# Patient Record
Sex: Male | Born: 1940
Health system: Southern US, Community
[De-identification: ages and names within clinical notes are randomized; demographics above are authoritative.]

## PROBLEM LIST (undated history)

## (undated) DIAGNOSIS — M1990 Unspecified osteoarthritis, unspecified site: Secondary | ICD-10-CM

## (undated) DIAGNOSIS — T7840XA Allergy, unspecified, initial encounter: Secondary | ICD-10-CM

## (undated) DIAGNOSIS — C8590 Non-Hodgkin lymphoma, unspecified, unspecified site: Secondary | ICD-10-CM

## (undated) DIAGNOSIS — D682 Hereditary deficiency of other clotting factors: Secondary | ICD-10-CM

## (undated) DIAGNOSIS — IMO0002 Reserved for concepts with insufficient information to code with codable children: Secondary | ICD-10-CM

## (undated) DIAGNOSIS — Z860100 Personal history of colon polyps, unspecified: Secondary | ICD-10-CM

## (undated) DIAGNOSIS — M545 Low back pain, unspecified: Secondary | ICD-10-CM

## (undated) DIAGNOSIS — E785 Hyperlipidemia, unspecified: Secondary | ICD-10-CM

## (undated) DIAGNOSIS — Z9289 Personal history of other medical treatment: Secondary | ICD-10-CM

## (undated) DIAGNOSIS — N400 Enlarged prostate without lower urinary tract symptoms: Secondary | ICD-10-CM

## (undated) DIAGNOSIS — F419 Anxiety disorder, unspecified: Secondary | ICD-10-CM

## (undated) DIAGNOSIS — N200 Calculus of kidney: Secondary | ICD-10-CM

## (undated) DIAGNOSIS — G8929 Other chronic pain: Secondary | ICD-10-CM

## (undated) DIAGNOSIS — C4441 Basal cell carcinoma of skin of scalp and neck: Secondary | ICD-10-CM

## (undated) DIAGNOSIS — I499 Cardiac arrhythmia, unspecified: Secondary | ICD-10-CM

## (undated) DIAGNOSIS — I219 Acute myocardial infarction, unspecified: Secondary | ICD-10-CM

## (undated) DIAGNOSIS — I2510 Atherosclerotic heart disease of native coronary artery without angina pectoris: Secondary | ICD-10-CM

## (undated) DIAGNOSIS — K5730 Diverticulosis of large intestine without perforation or abscess without bleeding: Secondary | ICD-10-CM

## (undated) DIAGNOSIS — F32A Depression, unspecified: Secondary | ICD-10-CM

## (undated) DIAGNOSIS — D693 Immune thrombocytopenic purpura: Secondary | ICD-10-CM

## (undated) DIAGNOSIS — F329 Major depressive disorder, single episode, unspecified: Secondary | ICD-10-CM

## (undated) DIAGNOSIS — Z7901 Long term (current) use of anticoagulants: Secondary | ICD-10-CM

## (undated) HISTORY — PX: KNEE SURGERY: SHX244

## (undated) HISTORY — PX: MOHS SURGERY: SUR867

## (undated) HISTORY — DX: Acute myocardial infarction, unspecified: I21.9

## (undated) HISTORY — DX: Atherosclerotic heart disease of native coronary artery without angina pectoris: I25.10

## (undated) HISTORY — PX: VASECTOMY: SHX75

## (undated) HISTORY — PX: TONSILLECTOMY: SUR1361

## (undated) HISTORY — PX: COLONOSCOPY: SHX174

## (undated) HISTORY — PX: CATARACT EXTRACTION W/ INTRAOCULAR LENS  IMPLANT, BILATERAL: SHX1307

## (undated) HISTORY — DX: Allergy, unspecified, initial encounter: T78.40XA

## (undated) HISTORY — DX: Unspecified osteoarthritis, unspecified site: M19.90

## (undated) HISTORY — DX: Anxiety disorder, unspecified: F41.9

## (undated) HISTORY — DX: Benign prostatic hyperplasia without lower urinary tract symptoms: N40.0

## (undated) HISTORY — DX: Personal history of colon polyps, unspecified: Z86.0100

## (undated) HISTORY — DX: Major depressive disorder, single episode, unspecified: F32.9

## (undated) HISTORY — DX: Depression, unspecified: F32.A

## (undated) HISTORY — DX: Diverticulosis of large intestine without perforation or abscess without bleeding: K57.30

## (undated) HISTORY — DX: Hyperlipidemia, unspecified: E78.5

## (undated) HISTORY — PX: LUMBAR EPIDURAL INJECTION: SHX1980

## (undated) HISTORY — PX: CYST EXCISION: SHX5701

## (undated) HISTORY — DX: Long term (current) use of anticoagulants: Z79.01

## (undated) HISTORY — DX: Personal history of colonic polyps: Z86.010

---
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
## 2004-07-31 ENCOUNTER — Ambulatory Visit: Payer: Self-pay | Admitting: Internal Medicine

## 2004-09-18 ENCOUNTER — Ambulatory Visit: Payer: Self-pay | Admitting: Internal Medicine

## 2005-01-09 ENCOUNTER — Ambulatory Visit: Payer: Self-pay | Admitting: Internal Medicine

## 2005-03-20 ENCOUNTER — Ambulatory Visit: Payer: Self-pay | Admitting: Internal Medicine

## 2005-08-06 ENCOUNTER — Ambulatory Visit: Payer: Self-pay | Admitting: Internal Medicine

## 2006-08-08 ENCOUNTER — Ambulatory Visit: Payer: Self-pay | Admitting: Internal Medicine

## 2006-08-08 LAB — CONVERTED CEMR LAB
Albumin: 4 g/dL (ref 3.5–5.2)
Alkaline Phosphatase: 69 units/L (ref 39–117)
Basophils Absolute: 0 10*3/uL (ref 0.0–0.1)
Basophils Relative: 0.4 % (ref 0.0–1.0)
Bilirubin, Direct: 0.1 mg/dL (ref 0.0–0.3)
Chloride: 106 meq/L (ref 96–112)
Cholesterol: 208 mg/dL (ref 0–200)
Creatinine, Ser: 0.8 mg/dL (ref 0.4–1.5)
Direct LDL: 137.3 mg/dL
Eosinophils Absolute: 0.2 10*3/uL (ref 0.0–0.6)
Eosinophils Relative: 4.3 % (ref 0.0–5.0)
GFR calc Af Amer: 124 mL/min
GFR calc non Af Amer: 102 mL/min
Hemoglobin: 16.5 g/dL (ref 13.0–17.0)
MCHC: 34 g/dL (ref 30.0–36.0)
Monocytes Absolute: 0.3 10*3/uL (ref 0.2–0.7)
Monocytes Relative: 6.4 % (ref 3.0–11.0)
Neutro Abs: 3.5 10*3/uL (ref 1.4–7.7)
Neutrophils Relative %: 65 % (ref 43.0–77.0)
RDW: 11.9 % (ref 11.5–14.6)
Total Bilirubin: 1.4 mg/dL — ABNORMAL HIGH (ref 0.3–1.2)

## 2006-08-21 ENCOUNTER — Ambulatory Visit: Payer: Self-pay | Admitting: Internal Medicine

## 2006-09-06 ENCOUNTER — Ambulatory Visit: Payer: Self-pay | Admitting: Gastroenterology

## 2006-09-17 ENCOUNTER — Encounter: Payer: Self-pay | Admitting: Gastroenterology

## 2006-09-17 ENCOUNTER — Encounter: Payer: Self-pay | Admitting: Internal Medicine

## 2006-09-17 ENCOUNTER — Ambulatory Visit: Payer: Self-pay | Admitting: Gastroenterology

## 2006-09-17 LAB — HM COLONOSCOPY

## 2006-10-22 ENCOUNTER — Encounter: Payer: Self-pay | Admitting: Internal Medicine

## 2006-10-22 ENCOUNTER — Ambulatory Visit (HOSPITAL_BASED_OUTPATIENT_CLINIC_OR_DEPARTMENT_OTHER): Admission: RE | Admit: 2006-10-22 | Discharge: 2006-10-22 | Payer: Self-pay | Admitting: Internal Medicine

## 2006-10-26 ENCOUNTER — Ambulatory Visit: Payer: Self-pay | Admitting: Pulmonary Disease

## 2006-11-18 ENCOUNTER — Encounter: Payer: Self-pay | Admitting: Internal Medicine

## 2006-11-18 DIAGNOSIS — N23 Unspecified renal colic: Secondary | ICD-10-CM | POA: Insufficient documentation

## 2006-11-18 DIAGNOSIS — E785 Hyperlipidemia, unspecified: Secondary | ICD-10-CM | POA: Insufficient documentation

## 2006-11-18 DIAGNOSIS — J309 Allergic rhinitis, unspecified: Secondary | ICD-10-CM

## 2006-11-18 DIAGNOSIS — K5730 Diverticulosis of large intestine without perforation or abscess without bleeding: Secondary | ICD-10-CM | POA: Insufficient documentation

## 2006-11-18 DIAGNOSIS — N400 Enlarged prostate without lower urinary tract symptoms: Secondary | ICD-10-CM | POA: Insufficient documentation

## 2006-11-18 DIAGNOSIS — M1990 Unspecified osteoarthritis, unspecified site: Secondary | ICD-10-CM

## 2007-01-08 ENCOUNTER — Telehealth: Payer: Self-pay | Admitting: Family Medicine

## 2007-03-17 ENCOUNTER — Ambulatory Visit: Payer: Self-pay | Admitting: Physical Medicine & Rehabilitation

## 2007-03-17 ENCOUNTER — Encounter
Admission: RE | Admit: 2007-03-17 | Discharge: 2007-05-06 | Payer: Self-pay | Admitting: Physical Medicine & Rehabilitation

## 2007-04-16 ENCOUNTER — Ambulatory Visit (HOSPITAL_COMMUNITY)
Admission: RE | Admit: 2007-04-16 | Discharge: 2007-04-16 | Payer: Self-pay | Admitting: Physical Medicine & Rehabilitation

## 2007-04-25 ENCOUNTER — Ambulatory Visit (HOSPITAL_COMMUNITY)
Admission: RE | Admit: 2007-04-25 | Discharge: 2007-04-25 | Payer: Self-pay | Admitting: Physical Medicine & Rehabilitation

## 2007-04-28 ENCOUNTER — Ambulatory Visit: Payer: Self-pay | Admitting: Physical Medicine & Rehabilitation

## 2007-05-15 DIAGNOSIS — I219 Acute myocardial infarction, unspecified: Secondary | ICD-10-CM

## 2007-05-15 HISTORY — DX: Acute myocardial infarction, unspecified: I21.9

## 2007-05-27 ENCOUNTER — Encounter
Admission: RE | Admit: 2007-05-27 | Discharge: 2007-05-27 | Payer: Self-pay | Admitting: Physical Medicine & Rehabilitation

## 2007-05-29 ENCOUNTER — Ambulatory Visit: Payer: Self-pay | Admitting: Cardiology

## 2007-05-29 ENCOUNTER — Inpatient Hospital Stay (HOSPITAL_COMMUNITY): Admission: EM | Admit: 2007-05-29 | Discharge: 2007-06-02 | Payer: Self-pay | Admitting: Cardiology

## 2007-05-29 HISTORY — PX: CORONARY ANGIOPLASTY WITH STENT PLACEMENT: SHX49

## 2007-05-30 ENCOUNTER — Encounter: Payer: Self-pay | Admitting: Cardiology

## 2007-06-12 ENCOUNTER — Ambulatory Visit: Payer: Self-pay | Admitting: Cardiology

## 2007-06-19 ENCOUNTER — Encounter (HOSPITAL_COMMUNITY): Admission: RE | Admit: 2007-06-19 | Discharge: 2007-09-17 | Payer: Self-pay | Admitting: Cardiology

## 2007-06-24 ENCOUNTER — Ambulatory Visit: Payer: Self-pay | Admitting: Cardiology

## 2007-06-24 ENCOUNTER — Encounter: Payer: Self-pay | Admitting: Cardiology

## 2007-06-24 ENCOUNTER — Ambulatory Visit: Payer: Self-pay

## 2007-06-24 LAB — CONVERTED CEMR LAB
ALT: 27 units/L (ref 0–53)
AST: 25 units/L (ref 0–37)
Albumin: 3.9 g/dL (ref 3.5–5.2)
Alkaline Phosphatase: 60 units/L (ref 39–117)
Basophils Absolute: 0 10*3/uL (ref 0.0–0.1)
CO2: 30 meq/L (ref 19–32)
Chloride: 105 meq/L (ref 96–112)
Creatinine, Ser: 1 mg/dL (ref 0.4–1.5)
Eosinophils Relative: 2.2 % (ref 0.0–5.0)
GFR calc non Af Amer: 79 mL/min
HDL: 38.9 mg/dL — ABNORMAL LOW (ref >39.0)
Hemoglobin: 15.9 g/dL (ref 13.0–17.0)
LDL Cholesterol: 45 mg/dL (ref 0–99)
MCV: 96.8 fL (ref 78.0–100.0)
Platelets: 117 10*3/uL — ABNORMAL LOW (ref 150–400)
RDW: 11.7 % (ref 11.5–14.6)
Sodium: 141 meq/L (ref 135–145)
Total CHOL/HDL Ratio: 2.4
Triglycerides: 56 mg/dL (ref 0–149)
VLDL: 11 mg/dL (ref 0–40)

## 2007-06-28 ENCOUNTER — Ambulatory Visit: Payer: Self-pay | Admitting: *Deleted

## 2007-06-28 ENCOUNTER — Observation Stay (HOSPITAL_COMMUNITY): Admission: EM | Admit: 2007-06-28 | Discharge: 2007-06-29 | Payer: Self-pay | Admitting: Emergency Medicine

## 2007-07-09 ENCOUNTER — Ambulatory Visit: Payer: Self-pay | Admitting: Internal Medicine

## 2007-07-10 ENCOUNTER — Ambulatory Visit: Payer: Self-pay | Admitting: Internal Medicine

## 2007-07-10 DIAGNOSIS — I2510 Atherosclerotic heart disease of native coronary artery without angina pectoris: Secondary | ICD-10-CM

## 2007-07-10 DIAGNOSIS — J069 Acute upper respiratory infection, unspecified: Secondary | ICD-10-CM | POA: Insufficient documentation

## 2007-07-10 DIAGNOSIS — I10 Essential (primary) hypertension: Secondary | ICD-10-CM | POA: Insufficient documentation

## 2007-07-10 LAB — CONVERTED CEMR LAB: PSA: 0.97 ng/mL (ref 0.10–4.00)

## 2007-07-22 ENCOUNTER — Ambulatory Visit: Payer: Self-pay | Admitting: Cardiology

## 2007-07-28 ENCOUNTER — Ambulatory Visit: Payer: Self-pay | Admitting: Internal Medicine

## 2007-07-28 LAB — CONVERTED CEMR LAB
Basophils Absolute: 0 10*3/uL (ref 0.0–0.1)
Basophils Relative: 0.8 % (ref 0.0–1.0)
Eosinophils Absolute: 0.1 10*3/uL (ref 0.0–0.6)
Eosinophils Relative: 2.6 % (ref 0.0–5.0)
HCT: 47.6 % (ref 39.0–52.0)
Lymphocytes Relative: 30.5 % (ref 12.0–46.0)
Monocytes Absolute: 0.4 10*3/uL (ref 0.2–0.7)
Neutrophils Relative %: 58.7 % (ref 43.0–77.0)
RDW: 12.4 % (ref 11.5–14.6)
WBC: 4.9 10*3/uL (ref 4.5–10.5)

## 2007-07-30 ENCOUNTER — Telehealth: Payer: Self-pay | Admitting: Internal Medicine

## 2007-08-20 ENCOUNTER — Ambulatory Visit: Payer: Self-pay | Admitting: Cardiology

## 2007-08-20 LAB — CONVERTED CEMR LAB
Alkaline Phosphatase: 49 units/L (ref 39–117)
Cholesterol: 93 mg/dL (ref 0–200)
Total CHOL/HDL Ratio: 2.3
Triglycerides: 72 mg/dL (ref 0–149)
VLDL: 14 mg/dL (ref 0–40)

## 2007-09-15 ENCOUNTER — Encounter: Payer: Self-pay | Admitting: Family Medicine

## 2007-09-18 ENCOUNTER — Encounter (HOSPITAL_COMMUNITY): Admission: RE | Admit: 2007-09-18 | Discharge: 2007-09-26 | Payer: Self-pay | Admitting: Cardiology

## 2007-12-02 ENCOUNTER — Ambulatory Visit: Payer: Self-pay | Admitting: Cardiology

## 2007-12-03 ENCOUNTER — Ambulatory Visit: Payer: Self-pay | Admitting: Cardiology

## 2007-12-03 LAB — CONVERTED CEMR LAB
ALT: 31 units/L (ref 0–53)
AST: 25 units/L (ref 0–37)
Albumin: 4 g/dL (ref 3.5–5.2)
Basophils Relative: 0.4 % (ref 0.0–3.0)
CO2: 29 meq/L (ref 19–32)
Calcium: 9.1 mg/dL (ref 8.4–10.5)
Creatinine, Ser: 1 mg/dL (ref 0.4–1.5)
Eosinophils Absolute: 0.1 10*3/uL (ref 0.0–0.7)
GFR calc non Af Amer: 79 mL/min
HCT: 45.1 % (ref 39.0–52.0)
Hemoglobin: 15.2 g/dL (ref 13.0–17.0)
LDL Cholesterol: 40 mg/dL (ref 0–99)
Lymphocytes Relative: 38.4 % (ref 12.0–46.0)
MCHC: 33.7 g/dL (ref 30.0–36.0)
MCV: 98.2 fL (ref 78.0–100.0)
Monocytes Relative: 6.9 % (ref 3.0–12.0)
Neutro Abs: 2.1 10*3/uL (ref 1.4–7.7)
Platelets: 120 10*3/uL — ABNORMAL LOW (ref 150–400)
Potassium: 4.8 meq/L (ref 3.5–5.1)
RDW: 12.4 % (ref 11.5–14.6)
Sodium: 143 meq/L (ref 135–145)
Total CHOL/HDL Ratio: 2.4
Total Protein: 6.1 g/dL (ref 6.0–8.3)

## 2008-01-23 ENCOUNTER — Telehealth: Payer: Self-pay | Admitting: Internal Medicine

## 2008-02-26 ENCOUNTER — Ambulatory Visit: Payer: Self-pay | Admitting: Internal Medicine

## 2008-02-26 DIAGNOSIS — D696 Thrombocytopenia, unspecified: Secondary | ICD-10-CM

## 2008-02-27 ENCOUNTER — Telehealth: Payer: Self-pay | Admitting: Internal Medicine

## 2008-02-27 LAB — CONVERTED CEMR LAB
MCHC: 34.3 g/dL (ref 30.0–36.0)
MCV: 98.4 fL (ref 78.0–100.0)
Monocytes Absolute: 0.5 10*3/uL (ref 0.1–1.0)
Neutrophils Relative %: 66.3 % (ref 43.0–77.0)
RBC: 4.51 M/uL (ref 4.22–5.81)
RDW: 12.6 % (ref 11.5–14.6)

## 2008-03-09 ENCOUNTER — Ambulatory Visit: Payer: Self-pay | Admitting: Oncology

## 2008-03-17 ENCOUNTER — Encounter: Payer: Self-pay | Admitting: Internal Medicine

## 2008-03-17 LAB — CBC & DIFF AND RETIC
BASO%: 0.3 % (ref 0.0–2.0)
EOS%: 1.9 % (ref 0.0–7.0)
IRF: 0.31 (ref 0.070–0.380)
MCH: 33.1 pg (ref 28.0–33.4)
MCHC: 33.3 g/dL (ref 32.0–35.9)
MONO#: 0.3 10*3/uL (ref 0.1–0.9)
NEUT%: 68.7 % (ref 40.0–75.0)
RBC: 4.45 10*6/uL (ref 4.20–5.71)
RDW: 12.9 % (ref 11.2–14.6)
RETIC #: 27.6 10*3/uL — ABNORMAL LOW (ref 31.8–103.9)
Retic %: 0.6 % — ABNORMAL LOW (ref 0.7–2.3)
WBC: 6.1 10*3/uL (ref 4.0–10.0)
lymph#: 1.4 10*3/uL (ref 0.9–3.3)

## 2008-03-17 LAB — MORPHOLOGY
PLT EST: DECREASED
RBC Comments: NORMAL

## 2008-03-17 LAB — CHCC SMEAR

## 2008-03-17 LAB — ERYTHROCYTE SEDIMENTATION RATE: Sed Rate: 0 mm/hr (ref 0–20)

## 2008-03-18 LAB — COMPREHENSIVE METABOLIC PANEL
ALT: 30 U/L (ref 0–53)
AST: 22 U/L (ref 0–37)
CO2: 28 mEq/L (ref 19–32)
Calcium: 9 mg/dL (ref 8.4–10.5)
Chloride: 105 mEq/L (ref 96–112)
Creatinine, Ser: 0.87 mg/dL (ref 0.40–1.50)
Potassium: 4.5 mEq/L (ref 3.5–5.3)
Sodium: 141 mEq/L (ref 135–145)
Total Protein: 6.5 g/dL (ref 6.0–8.3)

## 2008-05-16 DIAGNOSIS — I255 Ischemic cardiomyopathy: Secondary | ICD-10-CM | POA: Insufficient documentation

## 2008-05-18 ENCOUNTER — Ambulatory Visit: Payer: Self-pay | Admitting: Cardiology

## 2008-05-27 ENCOUNTER — Ambulatory Visit: Payer: Self-pay | Admitting: Cardiology

## 2008-05-27 LAB — CONVERTED CEMR LAB
Albumin: 3.9 g/dL (ref 3.5–5.2)
BUN: 14 mg/dL (ref 6–23)
Basophils Relative: 0.6 % (ref 0.0–3.0)
CO2: 32 meq/L (ref 19–32)
Calcium: 9 mg/dL (ref 8.4–10.5)
Creatinine, Ser: 0.8 mg/dL (ref 0.4–1.5)
Eosinophils Absolute: 0.2 10*3/uL (ref 0.0–0.7)
Eosinophils Relative: 3.3 % (ref 0.0–5.0)
GFR calc non Af Amer: 102 mL/min
HCT: 43 % (ref 39.0–52.0)
HDL: 51.7 mg/dL (ref >39.0)
Hemoglobin: 14.8 g/dL (ref 13.0–17.0)
Lymphocytes Relative: 31.8 % (ref 12.0–46.0)
MCHC: 34.4 g/dL (ref 30.0–36.0)
Neutro Abs: 2.7 10*3/uL (ref 1.4–7.7)
Platelets: 120 10*3/uL — ABNORMAL LOW (ref 150–400)
Potassium: 4.2 meq/L (ref 3.5–5.1)
RDW: 12.1 % (ref 11.5–14.6)
Sodium: 140 meq/L (ref 135–145)
Total Protein: 6 g/dL (ref 6.0–8.3)
Triglycerides: 77 mg/dL (ref 0–149)
VLDL: 15 mg/dL (ref 0–40)

## 2008-06-08 ENCOUNTER — Ambulatory Visit: Payer: Self-pay | Admitting: Oncology

## 2008-06-08 ENCOUNTER — Encounter: Payer: Self-pay | Admitting: Internal Medicine

## 2008-06-08 LAB — CBC WITH DIFFERENTIAL/PLATELET
Eosinophils Absolute: 0.1 10*3/uL (ref 0.0–0.5)
HCT: 43.6 % (ref 38.7–49.9)
LYMPH%: 30.4 % (ref 14.0–48.0)
MCV: 97.7 fL (ref 81.6–98.0)
MONO#: 0.4 10*3/uL (ref 0.1–0.9)
MONO%: 8.4 % (ref 0.0–13.0)
NEUT#: 2.5 10*3/uL (ref 1.5–6.5)
NEUT%: 57.2 % (ref 40.0–75.0)
Platelets: 114 10*3/uL — ABNORMAL LOW (ref 145–400)
RBC: 4.46 10*6/uL (ref 4.20–5.71)
WBC: 4.3 10*3/uL (ref 4.0–10.0)

## 2008-06-08 LAB — MORPHOLOGY

## 2008-06-11 ENCOUNTER — Encounter: Admission: RE | Admit: 2008-06-11 | Discharge: 2008-06-11 | Payer: Self-pay | Admitting: Otolaryngology

## 2008-07-12 ENCOUNTER — Encounter: Payer: Self-pay | Admitting: Internal Medicine

## 2008-07-27 ENCOUNTER — Ambulatory Visit: Payer: Self-pay | Admitting: Internal Medicine

## 2008-08-23 ENCOUNTER — Encounter: Payer: Self-pay | Admitting: Cardiology

## 2008-08-23 ENCOUNTER — Ambulatory Visit: Payer: Self-pay | Admitting: Cardiology

## 2008-09-08 ENCOUNTER — Ambulatory Visit: Payer: Self-pay | Admitting: Cardiology

## 2008-09-08 DIAGNOSIS — I2510 Atherosclerotic heart disease of native coronary artery without angina pectoris: Secondary | ICD-10-CM | POA: Insufficient documentation

## 2008-09-08 LAB — CONVERTED CEMR LAB
BUN: 12 mg/dL (ref 6–23)
CO2: 32 meq/L (ref 19–32)
Calcium: 8.8 mg/dL (ref 8.4–10.5)
Chloride: 108 meq/L (ref 96–112)
Creatinine, Ser: 0.9 mg/dL (ref 0.4–1.5)

## 2008-09-13 ENCOUNTER — Ambulatory Visit: Payer: Self-pay | Admitting: Oncology

## 2008-09-14 ENCOUNTER — Ambulatory Visit: Payer: Self-pay | Admitting: Cardiology

## 2008-09-14 DIAGNOSIS — G4733 Obstructive sleep apnea (adult) (pediatric): Secondary | ICD-10-CM

## 2008-09-15 ENCOUNTER — Encounter: Payer: Self-pay | Admitting: Internal Medicine

## 2008-09-15 LAB — CBC WITH DIFFERENTIAL/PLATELET
BASO%: 0.4 % (ref 0.0–2.0)
Basophils Absolute: 0 10*3/uL (ref 0.0–0.1)
EOS%: 3.2 % (ref 0.0–7.0)
HGB: 13.5 g/dL (ref 13.0–17.1)
MCH: 33.3 pg (ref 27.2–33.4)
MCHC: 33.8 g/dL (ref 32.0–36.0)
MCV: 98.4 fL — ABNORMAL HIGH (ref 79.3–98.0)
MONO%: 7.2 % (ref 0.0–14.0)
RDW: 13 % (ref 11.0–14.6)
lymph#: 1.3 10*3/uL (ref 0.9–3.3)

## 2008-09-17 ENCOUNTER — Ambulatory Visit: Payer: Self-pay | Admitting: Pulmonary Disease

## 2008-09-17 DIAGNOSIS — G2589 Other specified extrapyramidal and movement disorders: Secondary | ICD-10-CM

## 2008-09-20 LAB — CONVERTED CEMR LAB
Iron: 109 ug/dL (ref 42–165)
Saturation Ratios: 37.4 % (ref 20.0–50.0)
Transferrin: 208.1 mg/dL — ABNORMAL LOW (ref 212.0–360.0)

## 2008-10-20 ENCOUNTER — Encounter: Payer: Self-pay | Admitting: Cardiology

## 2008-10-20 ENCOUNTER — Encounter: Payer: Self-pay | Admitting: Internal Medicine

## 2009-01-12 ENCOUNTER — Telehealth (INDEPENDENT_AMBULATORY_CARE_PROVIDER_SITE_OTHER): Payer: Self-pay | Admitting: *Deleted

## 2009-01-13 ENCOUNTER — Telehealth (INDEPENDENT_AMBULATORY_CARE_PROVIDER_SITE_OTHER): Payer: Self-pay | Admitting: *Deleted

## 2009-03-04 ENCOUNTER — Ambulatory Visit: Payer: Self-pay | Admitting: Oncology

## 2009-03-08 ENCOUNTER — Encounter: Payer: Self-pay | Admitting: Cardiology

## 2009-03-08 LAB — CBC WITH DIFFERENTIAL/PLATELET
BASO%: 0.3 % (ref 0.0–2.0)
Basophils Absolute: 0 10*3/uL (ref 0.0–0.1)
EOS%: 2.6 % (ref 0.0–7.0)
HCT: 43.1 % (ref 38.4–49.9)
HGB: 14.3 g/dL (ref 13.0–17.1)
LYMPH%: 27.2 % (ref 14.0–49.0)
MCH: 33.8 pg — ABNORMAL HIGH (ref 27.2–33.4)
MCHC: 33.3 g/dL (ref 32.0–36.0)
MCV: 101.6 fL — ABNORMAL HIGH (ref 79.3–98.0)
MONO%: 7.1 % (ref 0.0–14.0)
NEUT%: 62.8 % (ref 39.0–75.0)
Platelets: 104 10*3/uL — ABNORMAL LOW (ref 140–400)

## 2009-03-16 ENCOUNTER — Ambulatory Visit: Payer: Self-pay | Admitting: Cardiovascular Disease

## 2009-03-16 ENCOUNTER — Ambulatory Visit (HOSPITAL_COMMUNITY): Admission: RE | Admit: 2009-03-16 | Discharge: 2009-03-16 | Payer: Self-pay | Admitting: Cardiology

## 2009-03-16 ENCOUNTER — Encounter: Payer: Self-pay | Admitting: Cardiology

## 2009-03-16 ENCOUNTER — Ambulatory Visit: Payer: Self-pay

## 2009-03-16 ENCOUNTER — Ambulatory Visit: Payer: Self-pay | Admitting: Cardiology

## 2009-03-23 ENCOUNTER — Encounter (INDEPENDENT_AMBULATORY_CARE_PROVIDER_SITE_OTHER): Payer: Self-pay | Admitting: *Deleted

## 2009-03-24 ENCOUNTER — Ambulatory Visit: Payer: Self-pay | Admitting: Cardiology

## 2009-03-25 ENCOUNTER — Ambulatory Visit: Payer: Self-pay | Admitting: Cardiology

## 2009-03-25 LAB — CONVERTED CEMR LAB
AST: 25 units/L (ref 0–37)
BUN: 15 mg/dL (ref 6–23)
Basophils Absolute: 0 10*3/uL (ref 0.0–0.1)
Bilirubin, Direct: 0.2 mg/dL (ref 0.0–0.3)
CO2: 31 meq/L (ref 19–32)
Calcium: 8.9 mg/dL (ref 8.4–10.5)
Chloride: 106 meq/L (ref 96–112)
Cholesterol: 99 mg/dL (ref 0–200)
Eosinophils Absolute: 0.2 10*3/uL (ref 0.0–0.7)
Eosinophils Relative: 4.1 % (ref 0.0–5.0)
GFR calc non Af Amer: 88.55 mL/min (ref >60)
Glucose, Bld: 102 mg/dL — ABNORMAL HIGH (ref 70–99)
HCT: 44.7 % (ref 39.0–52.0)
Lymphocytes Relative: 36 % (ref 12.0–46.0)
Lymphs Abs: 1.5 10*3/uL (ref 0.7–4.0)
MCHC: 34 g/dL (ref 30.0–36.0)
MCV: 103.4 fL — ABNORMAL HIGH (ref 78.0–100.0)
Monocytes Absolute: 0.3 10*3/uL (ref 0.1–1.0)
Neutro Abs: 2.3 10*3/uL (ref 1.4–7.7)
Platelets: 103 10*3/uL — ABNORMAL LOW (ref 150.0–400.0)
Potassium: 4.4 meq/L (ref 3.5–5.1)
RDW: 12 % (ref 11.5–14.6)
Sodium: 142 meq/L (ref 135–145)
Total Protein: 5.8 g/dL — ABNORMAL LOW (ref 6.0–8.3)
Triglycerides: 61 mg/dL (ref 0.0–149.0)
WBC: 4.3 10*3/uL — ABNORMAL LOW (ref 4.5–10.5)

## 2009-04-11 ENCOUNTER — Telehealth: Payer: Self-pay | Admitting: Cardiology

## 2009-04-13 ENCOUNTER — Encounter (INDEPENDENT_AMBULATORY_CARE_PROVIDER_SITE_OTHER): Payer: Self-pay | Admitting: *Deleted

## 2009-05-10 ENCOUNTER — Emergency Department (HOSPITAL_BASED_OUTPATIENT_CLINIC_OR_DEPARTMENT_OTHER): Admission: EM | Admit: 2009-05-10 | Discharge: 2009-05-11 | Payer: Self-pay | Admitting: Emergency Medicine

## 2009-05-20 ENCOUNTER — Telehealth: Payer: Self-pay | Admitting: Cardiology

## 2009-05-25 ENCOUNTER — Telehealth: Payer: Self-pay | Admitting: Internal Medicine

## 2009-06-03 ENCOUNTER — Ambulatory Visit: Payer: Self-pay | Admitting: Oncology

## 2009-06-07 ENCOUNTER — Encounter: Payer: Self-pay | Admitting: Internal Medicine

## 2009-06-07 LAB — CBC WITH DIFFERENTIAL/PLATELET
Basophils Absolute: 0 10*3/uL (ref 0.0–0.1)
HGB: 15.2 g/dL (ref 13.0–17.1)
LYMPH%: 24.3 % (ref 14.0–49.0)
MCH: 33.7 pg — ABNORMAL HIGH (ref 27.2–33.4)
MONO%: 7.3 % (ref 0.0–14.0)
NEUT#: 3.4 10*3/uL (ref 1.5–6.5)
NEUT%: 65.6 % (ref 39.0–75.0)
Platelets: 108 10*3/uL — ABNORMAL LOW (ref 140–400)
RDW: 12.7 % (ref 11.0–14.6)
WBC: 5.2 10*3/uL (ref 4.0–10.3)
lymph#: 1.3 10*3/uL (ref 0.9–3.3)

## 2009-07-18 ENCOUNTER — Telehealth: Payer: Self-pay | Admitting: Gastroenterology

## 2009-07-28 ENCOUNTER — Ambulatory Visit: Payer: Self-pay | Admitting: Internal Medicine

## 2009-07-28 LAB — CONVERTED CEMR LAB: PSA: 1.15 ng/mL (ref 0.10–4.00)

## 2009-08-26 ENCOUNTER — Ambulatory Visit: Payer: Self-pay | Admitting: Oncology

## 2009-08-30 ENCOUNTER — Encounter: Payer: Self-pay | Admitting: Internal Medicine

## 2009-08-30 LAB — CBC WITH DIFFERENTIAL/PLATELET
EOS%: 2.4 % (ref 0.0–7.0)
HCT: 42.6 % (ref 38.4–49.9)
MCH: 33.8 pg — ABNORMAL HIGH (ref 27.2–33.4)
MCHC: 33.6 g/dL (ref 32.0–36.0)
MCV: 100.4 fL — ABNORMAL HIGH (ref 79.3–98.0)
MONO#: 0.4 10*3/uL (ref 0.1–0.9)
WBC: 5.5 10*3/uL (ref 4.0–10.3)
lymph#: 1.3 10*3/uL (ref 0.9–3.3)

## 2009-08-30 LAB — MORPHOLOGY: RBC Comments: NORMAL

## 2009-09-06 ENCOUNTER — Telehealth: Payer: Self-pay

## 2009-09-08 ENCOUNTER — Ambulatory Visit: Payer: Self-pay | Admitting: Cardiology

## 2009-09-08 DIAGNOSIS — I119 Hypertensive heart disease without heart failure: Secondary | ICD-10-CM | POA: Insufficient documentation

## 2009-09-08 LAB — CONVERTED CEMR LAB
AST: 22 units/L (ref 0–37)
Albumin: 4.1 g/dL (ref 3.5–5.2)
Alkaline Phosphatase: 51 units/L (ref 39–117)
BUN: 15 mg/dL (ref 6–23)
Bilirubin, Direct: 0.3 mg/dL (ref 0.0–0.3)
Creatinine, Ser: 0.9 mg/dL (ref 0.4–1.5)
LDL Cholesterol: 37 mg/dL (ref 0–99)
Potassium: 3.9 meq/L (ref 3.5–5.1)
Total Bilirubin: 1.2 mg/dL (ref 0.3–1.2)
Total CHOL/HDL Ratio: 2
VLDL: 11.2 mg/dL (ref 0.0–40.0)

## 2009-09-12 ENCOUNTER — Ambulatory Visit: Payer: Self-pay | Admitting: Cardiology

## 2009-11-24 ENCOUNTER — Encounter: Payer: Self-pay | Admitting: Cardiology

## 2009-12-12 ENCOUNTER — Telehealth: Payer: Self-pay | Admitting: Cardiology

## 2009-12-12 ENCOUNTER — Encounter: Payer: Self-pay | Admitting: Internal Medicine

## 2010-01-31 ENCOUNTER — Telehealth: Payer: Self-pay | Admitting: Internal Medicine

## 2010-03-07 ENCOUNTER — Encounter: Payer: Self-pay | Admitting: Cardiology

## 2010-03-07 ENCOUNTER — Ambulatory Visit: Payer: Self-pay | Admitting: Cardiovascular Disease

## 2010-03-07 ENCOUNTER — Ambulatory Visit (HOSPITAL_COMMUNITY): Admission: RE | Admit: 2010-03-07 | Discharge: 2010-03-07 | Payer: Self-pay | Admitting: Cardiology

## 2010-03-07 ENCOUNTER — Ambulatory Visit: Payer: Self-pay

## 2010-03-07 ENCOUNTER — Ambulatory Visit: Payer: Self-pay | Admitting: Cardiology

## 2010-03-15 ENCOUNTER — Encounter: Payer: Self-pay | Admitting: Cardiology

## 2010-03-15 ENCOUNTER — Ambulatory Visit: Payer: Self-pay | Admitting: Oncology

## 2010-03-16 ENCOUNTER — Ambulatory Visit: Payer: Self-pay | Admitting: Cardiology

## 2010-03-16 LAB — CONVERTED CEMR LAB
Basophils Relative: 0.7 % (ref 0.0–3.0)
Bilirubin, Direct: 0.3 mg/dL (ref 0.0–0.3)
CO2: 28 meq/L (ref 19–32)
Calcium: 9 mg/dL (ref 8.4–10.5)
Eosinophils Relative: 3.6 % (ref 0.0–5.0)
HCT: 45.5 % (ref 39.0–52.0)
HDL: 52.8 mg/dL (ref >39.00)
Hemoglobin: 15 g/dL (ref 13.0–17.0)
MCHC: 33 g/dL (ref 30.0–36.0)
MCV: 101.2 fL — ABNORMAL HIGH (ref 78.0–100.0)
Monocytes Relative: 7.4 % (ref 3.0–12.0)
Neutrophils Relative %: 57.2 % (ref 43.0–77.0)
Platelets: 108 10*3/uL — ABNORMAL LOW (ref 150.0–400.0)
Potassium: 4 meq/L (ref 3.5–5.1)
RDW: 13.3 % (ref 11.5–14.6)
Sodium: 141 meq/L (ref 135–145)
Total CHOL/HDL Ratio: 2
Triglycerides: 45 mg/dL (ref 0.0–149.0)
VLDL: 9 mg/dL (ref 0.0–40.0)

## 2010-03-17 ENCOUNTER — Encounter: Payer: Self-pay | Admitting: Internal Medicine

## 2010-03-17 LAB — CBC WITH DIFFERENTIAL/PLATELET
EOS%: 2.4 % (ref 0.0–7.0)
Eosinophils Absolute: 0.1 10*3/uL (ref 0.0–0.5)
HCT: 44.5 % (ref 38.4–49.9)
HGB: 15 g/dL (ref 13.0–17.1)
LYMPH%: 29.1 % (ref 14.0–49.0)
MCH: 33.6 pg — ABNORMAL HIGH (ref 27.2–33.4)
MCV: 99.5 fL — ABNORMAL HIGH (ref 79.3–98.0)
MONO%: 7.8 % (ref 0.0–14.0)
NEUT#: 2.9 10*3/uL (ref 1.5–6.5)
NEUT%: 60.2 % (ref 39.0–75.0)
Platelets: 110 10*3/uL — ABNORMAL LOW (ref 140–400)

## 2010-06-13 NOTE — Progress Notes (Signed)
Summary: QUESTION ABOUT PLAVIX   Phone Note Call from Patient Call back at Home Phone [phone]   Caller: Patient Summary of Call: PT HAVING SURGERY FEB,8 AND HE NEED TO KNOW  WHEN HE SHOULD STOP HIS PLAVIX Initial call taken by: Judie Grieve,  May 20, 2009 10:10 AM  Follow-up for Phone Call        Pt. to have surgey by Dr. Herma Mering on June 21, 2009 for scalp lesion. Pt. wants to know when to stop Plavix and/or ASA prior to surgery.  Pt. also wanted to let us know that he recently cut finger and was seen in ED for this due to bleeding. Finger completely healed. (note of this visit in chart).  Pt aware Dr. Juanda Chance and Herbert Seta are not in office today and I will forward note to [facility]. Dossie Arbour, RN, BSN  May 20, 2009 11:02 AM  I called the pt today to f/u with him about his upcoming surgery. The pt is scheduled for a consultation on 06/16/09 with Dr. Park Liter at [facility]. He has been referred there by his dermatologist- he had a biopsy done of an area of his scalp and they did not get the margins they needed. Due to his bleeding that he usually has, his deramatologist is sending him to Dr. Park Liter. The pt states that Dr. Park Liter has not requested that he hold ASA or Plavix, but the pt is quite concerned about his bleeding that he usually experiences. I explained to the pt that I would need to discuss this with Dr. Juanda Chance on his return to the office on 06/08/09. The pt is agreeable.   Follow-up by: Sherri Rad, RN, BSN,  May 23, 2009 12:18 PM  Additional Follow-up for Phone Call Additional follow up Details #1::        As discussed can d/c 5 days before surgery. BB Additional Follow-up by: Lenoria Farrier, MD, [facility],  June 08, 2009 10:16 PM    Additional Follow-up for Phone Call Additional follow up Details #2::    Pt aware that it is ok to hold plavix for 5 days prior to procedure.  Follow-up by: Sherri Rad,  RN, BSN,  June 09, 2009 2:29 PM

## 2010-06-13 NOTE — Progress Notes (Signed)
Summary: Need for stool checks?  Phone Note Call from Patient   Caller: Spouse Summary of Call: Pt's wife calling.  Pt had adenomatous polyp removed in 2008.  Was told he would need to be checked again in 5 years.  Wife asks if pt should have stools checked for blood in between procedures.  She was told by her PCP that there were alot of false negatives.  She would like Dr. Christella Hartigan opinion. Initial call taken by: Ashok Cordia RN,  July 18, 2009 3:16 PM  Follow-up for Phone Call        can you put the colonoscopy and path reports into EMR and resend this to me.  I want to review them before answering her question (did I do the procedures?) Follow-up by: Rachael Fee MD,  July 18, 2009 4:39 PM  Additional Follow-up for Phone Call Additional follow up Details #1::        Colon and path report form 09/17/06 now in EMR. Additional Follow-up by: Ashok Cordia RN,  July 20, 2009 9:38 AM    Additional Follow-up for Phone Call Additional follow up Details #2::    there is no need for him to have FOBT testing in between his surveillance colonoscopies. Follow-up by: Rachael Fee MD,  July 20, 2009 8:44 PM  Additional Follow-up for Phone Call Additional follow up Details #3:: Details for Additional Follow-up Action Taken: Pt's wife notified.   Additional Follow-up by: Ashok Cordia RN,  July 22, 2009 4:13 PM

## 2010-06-13 NOTE — Letter (Signed)
Summary: [facility]  [facility]   Imported By: Sherian Rein 03/28/2010 09:40:41  _____________________________________________________________________  External Attachment:    Type:   Image     Comment:   External Document

## 2010-06-13 NOTE — Progress Notes (Signed)
Summary: psa test  Phone Note Call from Patient Call back at Home Phone [phone]   Caller: Patient Call For: Johnathan Savers  MD Summary of Call: PT would  like psa test and pick up  hemoccult fecal cards,can I sch psa test? Initial call taken by: Heron Sabins,  May 25, 2009 3:40 PM  Follow-up for Phone Call        all OK  Follow-up by: Johnathan Savers  MD,  May 26, 2009 9:10 AM  Additional Follow-up for Phone Call Additional follow up Details #1::        lab sch for 07-27-2009 2pm Additional Follow-up by: Heron Sabins,  May 26, 2009 10:23 AM

## 2010-06-13 NOTE — Assessment & Plan Note (Signed)
Summary: f8m   Visit Type:  Follow-up Referring Provider:  Dr. Charlies Constable Primary Provider:  Gordy Savers  MD  CC:  no complaints.  History of Present Illness: The patient is 70 years old and return for followup management of CAD. He had an anterior MI in January of 2009 treated with a promise stent to the LAD. His ejection fraction improved to 25% to 45-50% and on his last echocardiogram it was 55-60%.( his wife asked about the change in the ejection fraction between his last 2 echocardiograms).  He has been doing well over the past several months with no chest pain shortness of breath or palpitations.  His other problem includes ITP with platelet counts in the range of 104,000. This is followed by Dr. Kennedy Bucker for tonight. He also has hypertension, hyperlipidemia, and mild obstructive sleep apnea.  He and his wife are planning to go to the [place] area for the summer. He has an appointment to see Dr. Enid Skeens there.  Current Medications (verified): 1)  Ambien 10 Mg Tabs (Zolpidem Tartrate) .... Take 1 Tablet By Mouth At Bedtime 2)  Xanax 0.5 Mg Tabs (Alprazolam) .... Take 1 Tablet By Mouth Once A Day 3)  Coreg 3.125 Mg  Tabs (Carvedilol) .Marland Kitchen.. 1 Two Times A Day 4)  Plavix 75 Mg  Tabs (Clopidogrel Bisulfate) .Marland Kitchen.. 1 Once Daily 5)  Cozaar 50 Mg  Tabs (Losartan Potassium) .Marland Kitchen.. 1 Once Daily 6)  Allegra 180 Mg  Tabs (Fexofenadine Hcl) .Marland Kitchen.. 1 Once Daily As Needed 7)  Lipitor 40 Mg Tabs (Atorvastatin Calcium) .Marland Kitchen.. 1 Once Daily 8)  Nitrostat 0.4 Mg Subl (Nitroglycerin) .... Uad 9)  Saw Palmetto 450 Mg Caps (Saw Palmetto (Serenoa Repens)) .... Take 2  Capsule By Mouth At Bedtime 10)  Fish Oil 1200 Mg Caps (Omega-3 Fatty Acids) .... Take 1 Capsule By Mouth Two Times A Day 11)  Tylenol 8 Hour 650 Mg Cr-Tabs (Acetaminophen) .... Take 2 Am and 2 Pm 12)  Bufferin 325 Mg Tabs (Aspirin Buf(Cacarb-Mgcarb-Mgo)) .... Take 1 Tablet By Mouth Once A Day 13)  Restasis 0.05 % Emul (Cyclosporine) ....  Bid 14)  Vitamin D3  Allergies (verified): 1)  ! * Anti-Depressants  Past History:  Past Medical History: Reviewed history from 07/27/2008 and no changes required. Coronary artery disease status post angioplasty of a totally occluded LAD January 2009 Allergic rhinitis Diverticulosis, colon Hyperlipidemia Hypertension Nephrolithiasis, hx of Osteoarthritis Benign prostatic hypertrophy mild chronic ITP  1. Status post anterior wall myocardial infarction on May 29, 2007, treated with a PROMUS stent to the left anterior descending. 2. Ejection fraction 25% improved to 40-45% by echocardiography. 3. Hyperlipidemia. 4. Question obstructive sleep apnea. 5. History of thrombocytopenia.   Review of Systems       ROS is negative except as outlined in HPI.   Vital Signs:  Patient profile:   70 year old male Height:      66 inches Weight:      136 pounds BMI:     22.03 Pulse rate:   53 / minute BP sitting:   114 / 69  (left arm) Cuff size:   regular  Vitals Entered By: Burnett Kanaris, CNA (Sep 12, 2009 10:55 AM)  Physical Exam  Additional Exam:  Gen. Well-nourished, in no distress   Neck: No JVD, thyroid not enlarged, no carotid bruits Lungs: No tachypnea, clear without rales, rhonchi or wheezes Cardiovascular: Rhythm regular, PMI not displaced,  heart sounds  normal, no  murmurs or gallops, no peripheral edema, pulses normal in all 4 extremities. Abdomen: BS normal, abdomen soft and non-tender without masses or organomegaly, no hepatosplenomegaly. MS: No deformities, no cyanosis or clubbing   Neuro:  No focal sns   Skin:  no lesions    Impression & Recommendations:  Problem # 1:  CORONARY ATHEROSCLEROSIS NATIVE CORONARY ARTERY (ICD-414.01) He had an anterior MI in January of 2009 treated with a drug-eluting stent to the LAD. His ejection fraction improved from 25% to 55%. He's had no chest pain this problem appears stable. Because of the discrepancy between his  last 2 echocardiograms, we will plan a followup echocardiogram before his followup office visit in 6 months.   His updated medication list for this problem includes:    Coreg 3.125 Mg Tabs (Carvedilol) .Marland Kitchen... 1 two times a day    Plavix 75 Mg Tabs (Clopidogrel bisulfate) .Marland Kitchen... 1 once daily    Nitrostat 0.4 Mg Subl (Nitroglycerin) ..... Uad    Bufferin 325 Mg Tabs (Aspirin buf(cacarb-mgcarb-mgo)) .Marland Kitchen... Take 1 tablet by mouth once a day    Aspirin 81 Mg Tbec (Aspirin) .Marland Kitchen... Take one tablet by mouth daily  Problem # 2:  HYPERLIPIDEMIA (ICD-272.4) Assessment: Deteriorated His last lipid profile was excellent with an HDL of 52 and an LDL of 47. We will continue current therapy. His updated medication list for this problem includes:    Lipitor 40 Mg Tabs (Atorvastatin calcium) .Marland Kitchen... 1 once daily  Problem # 3:  THROMBOCYTOPENIA (ICD-287.5) He has ITP and this is followed by Dr. Cyndie Chime.  Other Orders: EKG w/ Interpretation (93000) Echocardiogram (Echo)  Patient Instructions: 1)  Your physician wants you to follow-up in: 6 months.  You will receive a reminder letter in the mail two months in advance. If you don't receive a letter, please call our office to schedule the follow-up appointment. 2)  Your physician has requested that you have an echocardiogram in 6 months- just prior to your office visit with Dr. Juanda Chance.  Echocardiography is a painless test that uses sound waves to create images of your heart. It provides your doctor with information about the size and shape of your heart and how well your heart's chambers and valves are working.  This procedure takes approximately one hour. There are no restrictions for this procedure. 3)  Your physician has recommended you make the following change in your medication: 1) Decrease aspirin to 81mg  once daily

## 2010-06-13 NOTE — Procedures (Signed)
Summary: Colon   Colonoscopy  Procedure date:  09/17/2006  Findings:      Location:  [facility].   Patient Name: [name], [name] MRN:  Procedure Procedures: Colonoscopy CPT: [phone].    with polypectomy. CPT: [id number].  Personnel: Endoscopist: Rachael Fee, MD.  Referred By: Gordy Savers, MD.  Exam Location: Exam performed in Endoscopy Suite. Outpatient  Patient Consent: Procedure, Alternatives, Risks and Benefits discussed, consent obtained, from patient. Consent was obtained by the RN.  Indications  Evaluation of: Positive fecal occult blood test  History  Current Medications: Patient is not currently taking Coumadin.  Comments: Patient history reviewed and updated, pre-procedure physical performed prior to initiation of sedation? yes Pre-Exam Physical: Performed Sep 17, 2006. Cardio-pulmonary exam, Abdominal exam, Mental status exam WNL.  Exam Exam: Extent of exam reached: Cecum, extent intended: Cecum.  The cecum was identified by appendiceal orifice and IC valve. Patient position: on left side. Time to Cecum: 00:03:50. Time for Withdrawl: 00:10:11. Colon retroflexion performed. Images taken. ASA Classification: II. Tolerance: good.  Monitoring: Pulse and BP monitoring, Oximetry used. Supplemental O2 given.  Colon Prep Prep results: good.  Sedation Meds: Patient assessed and found to be appropriate for moderate (conscious) sedation. Fentanyl 75 mcg. given IV. Versed 8 mg. given IV.  Findings POLYP: Sigmoid Colon, Maximum size: 4 mm. sessile polyp. Procedure:  snare without cautery, removed, retrieved, Polyp sent to pathology. Polyp sent to pathology.  POLYP: Sigmoid Colon, Maximum size: 2 mm. sessile polyp. Procedure:  snare without cautery, removed, retrieved, sent to pathology. sent to pathology.  - DIVERTICULOSIS: Descending Colon to Sigmoid Colon. Comments: extensive left sided diverticulosis.  - NORMAL EXAM: Cecum to Rectum.  Comments: otherwise normal examination.   Assessment Abnormal examination, see findings above.  Comments: TWO SMALL COLON POLYPS, NO CANCERS. Events  Unplanned Interventions: No intervention was required.  Unplanned Events: There were no complications. Plans Comments: IF THE POLYPS ARE TUBULAR ADENOMAS(PRECANCEROUS POLYPS), HE WILL NEED A REPEAT COLONOSCOPY IN 5 YEARS.  OTHERWISE, HE SHOULD CONTINUE TO FOLLOW COLORECTAL CANCER SCREENING GUIDELINES WITH A REPEAT COLONOSCOPY IN 10 YEARS. Scheduling/Referral: Await pathology to schedule patient.    cc: The Patient     Gordy Savers, MD     This report was created from the original endoscopy report, which was reviewed and signed by the above listed endoscopist.

## 2010-06-13 NOTE — Miscellaneous (Signed)
Summary: Appointment Canceled  Appointment status changed to canceled by [employer] on 12/12/2009 11:39 AM.  Cancellation Comments --------------------- echo/CAD/HTN/ medicare/bcbs pending/hm  Appointment Information ----------------------- Appt Type:  CARDIOLOGY ANCILLARY VISIT      Date:  Tuesday, March 07, 2010      Time:  2:00 PM for 60 min   Urgency:  Routine   Made By:  Pearson Grippe  To Visit:  [id number]    Reason:  echo/CAD/HTN/ medicare/bcbs pending/hm  Appt Comments ------------- -- 12/12/09 11:39: ([facility]) CANCELED -- echo/CAD/HTN/ medicare/bcbs pending/hm -- 12/09/09 11:51: ([facility]) BOOKED -- Routine CARDIOLOGY ANCILLARY VISIT at 03/07/2010 2:00 PM for 60 min echo/CAD/HTN/ medicare/bcbs pending/hm

## 2010-06-13 NOTE — Letter (Signed)
Summary: Dr Ovidio Hanger Office Visit Note   Dr Ovidio Hanger Office Visit Note   Imported By: Roderic Ovens 12/20/2009 12:43:39  _____________________________________________________________________  External Attachment:    Type:   Image     Comment:   External Document

## 2010-06-13 NOTE — Progress Notes (Signed)
Summary: labs results needed  Phone Note Call from Patient Call back at Work Phone [phone]   Caller: Patient---live call Reason for Call: Lab or Test Results Summary of Call: Would like his PSA results from last month. Initial call taken by: Warnell Forester,  September 06, 2009 12:29 PM  Follow-up for Phone Call        spoke with pt - gave PSA results - WNL     KIK Follow-up by: Duard Brady LPN,  September 06, 2009 2:16 PM

## 2010-06-13 NOTE — Progress Notes (Signed)
Summary: refill alprazoplam - rx expired  Phone Note Refill Request Message from:  Fax from Pharmacy on January 31, 2010 12:03 PM  Refills Requested: Medication #1:  XANAX 0.5 MG TABS Take 1 tablet by mouth once a day   Last Refilled: 11/02/2009 Benjamine Mola ph [phone]    [phone]   Method Requested: Telephone to Pharmacy Initial call taken by: Duard Brady LPN,  January 31, 2010 12:04 PM  Follow-up for Phone Call        spoke with pharmacy - rx expired , pt really only filled 1 time - gave refill ok. KIK Follow-up by: Duard Brady LPN,  January 31, 2010 12:04 PM    Prescriptions: Prudy Feeler 0.5 MG TABS (ALPRAZOLAM) Take 1 tablet by mouth once a day  #90 x 1   Entered by:   Duard Brady LPN   Authorized by:   Gordy Savers  MD   Signed by:   Duard Brady LPN on 62/13/0865   Method used:   Historical   RxID:   [id number]

## 2010-06-13 NOTE — Assessment & Plan Note (Signed)
Summary: FOLLOW UP/CB   Vital Signs:  Patient profile:   70 year old male Weight:      143 pounds Temp:     98.4 degrees F oral BP sitting:   122 / 62  (left arm) Cuff size:   regular  Vitals Entered By: Duard Brady LPN (July 28, 2009 10:00 AM) CC: f/u ROV, ? labs Is Patient Diabetic? No   Primary Care Provider:  Gordy Savers  MD  CC:  f/u ROV and ? labs.  History of Present Illness: 70 year old patient who is seen today in for follow-up.  He is followed  closely by oncology for thrombocytopenia.  He has a history of coronary artery disease and is followed by cardiology.  He has hypertension and is on statin therapy.  He has a history of diverticulosis.  His hematologic condition has been stable.  [facility] and a fasting blood sugar was 102  Preventive Screening-Counseling & Management  Alcohol-Tobacco     Smoking Status: never  Allergies: 1)  ! * Anti-Depressants  Past History:  Past Medical History: Reviewed history from 07/27/2008 and no changes required. Coronary artery disease status post angioplasty of a totally occluded LAD January 2009 Allergic rhinitis Diverticulosis, colon Hyperlipidemia Hypertension Nephrolithiasis, hx of Osteoarthritis Benign prostatic hypertrophy mild chronic ITP  1. Status post anterior wall myocardial infarction on May 29, 2007, treated with a PROMUS stent to the left anterior descending. 2. Ejection fraction 25% improved to 40-45% by echocardiography. 3. Hyperlipidemia. 4. Question obstructive sleep apnea. 5. History of thrombocytopenia.   Past Surgical History: Reviewed history from 07/10/2007 and no changes required. PTCA totally occluded LAD January 2009 Lumbar laminectomy June 2006 Tonsillectomy Vasectomy renal colic in June 2006  Family History: Reviewed history from 07/10/2007 and no changes required. father died at age  10, lymphoma mother died  age 9, senile dementia of the Alzheimer's type  Two brothers, one died at age 19 from complications of coronary artery disease positive for COPD and diabetes one sister is well Cousin with colon Ca Extended family history also positive for prostate cancer, colon cancer, with two uncles affected and lung cancer  Social History: Reviewed history from 09/17/2008 and no changes required. Married Lives with wife retired Never Smoked  Review of Systems  The patient denies anorexia, fever, weight loss, weight gain, vision loss, decreased hearing, hoarseness, chest pain, syncope, dyspnea on exertion, peripheral edema, prolonged cough, headaches, hemoptysis, abdominal pain, melena, hematochezia, severe indigestion/heartburn, hematuria, incontinence, genital sores, muscle weakness, suspicious skin lesions, transient blindness, difficulty walking, depression, unusual weight change, abnormal bleeding, enlarged lymph nodes, angioedema, breast masses, and testicular masses.    Physical Exam  General:  Well-developed,well-nourished,in no acute distress; alert,appropriate and cooperative throughout examination; 130/70 Head:  Normocephalic and atraumatic without obvious abnormalities. No apparent alopecia or balding. Eyes:  No corneal or conjunctival inflammation noted. EOMI. Perrla. Funduscopic exam benign, without hemorrhages, exudates or papilledema. Vision grossly normal. Mouth:  Oral mucosa and oropharynx without lesions or exudates.  Teeth in good repair. Neck:  No deformities, masses, or tenderness noted. Lungs:  Normal respiratory effort, chest expands symmetrically. Lungs are clear to auscultation, no crackles or wheezes. Heart:  Normal rate and regular rhythm. S1 and S2 normal without gallop, murmur, click, rub or other extra sounds. Abdomen:  Bowel sounds positive,abdomen soft and non-tender without masses, organomegaly or hernias noted. Rectal:  No external abnormalities noted. Normal  sphincter tone.  No rectal masses or tenderness. Genitalia:  Testes bilaterally descended without nodularity, tenderness or masses. No scrotal masses or lesions. No penis lesions or urethral discharge. Prostate:  2+ enlarged.  2+ enlarged.   Msk:  No deformity or scoliosis noted of thoracic or lumbar spine.   Pulses:  right dorsalis pedis pulse absent Extremities:  No clubbing, cyanosis, edema, or deformity noted with normal full range of motion of all joints.   Neurologic:  No cranial nerve deficits noted. Station and gait are normal. Plantar reflexes are down-going bilaterally. DTRs are symmetrical throughout. Sensory, motor and coordinative functions appear intact. Skin:  Intact without suspicious lesions or rashes Cervical Nodes:  No lymphadenopathy noted Psych:  Cognition and judgment appear intact. Alert and cooperative with normal attention span and concentration. No apparent delusions, illusions, hallucinations   Impression & Recommendations:  Problem # 1:  CORONARY ATHEROSCLEROSIS NATIVE CORONARY ARTERY (ICD-414.01)  The following medications were removed from the medication list:    Bayer Aspirin 325 Mg Tabs (Aspirin) .Marland Kitchen... Take 1 tablet by mouth once a day His updated medication list for this problem includes:    Coreg 3.125 Mg Tabs (Carvedilol) .Marland Kitchen... 1 two times a day    Plavix 75 Mg Tabs (Clopidogrel bisulfate) .Marland Kitchen... 1 once daily    Cozaar 50 Mg Tabs (Losartan potassium) .Marland Kitchen... 1 once daily    Nitrostat 0.4 Mg Subl (Nitroglycerin) ..... Uad    Bufferin 325 Mg Tabs (Aspirin buf(cacarb-mgcarb-mgo)) .Marland Kitchen... Take 1 tablet by mouth once a day  The following medications were removed from the medication list:    Bayer Aspirin 325 Mg Tabs (Aspirin) .Marland Kitchen... Take 1 tablet by mouth once a day His updated medication list for this problem includes:    Coreg 3.125 Mg Tabs (Carvedilol) .Marland Kitchen... 1 two times a day    Plavix 75 Mg Tabs (Clopidogrel bisulfate) .Marland Kitchen... 1 once daily    Cozaar 50 Mg Tabs  (Losartan potassium) .Marland Kitchen... 1 once daily    Nitrostat 0.4 Mg Subl (Nitroglycerin) ..... Uad    Bufferin 325 Mg Tabs (Aspirin buf(cacarb-mgcarb-mgo)) .Marland Kitchen... Take 1 tablet by mouth once a day  Problem # 2:  THROMBOCYTOPENIA (ICD-287.5)  Problem # 3:  HYPERTENSION (ICD-401.9)  His updated medication list for this problem includes:    Coreg 3.125 Mg Tabs (Carvedilol) .Marland Kitchen... 1 two times a day    Cozaar 50 Mg Tabs (Losartan potassium) .Marland Kitchen... 1 once daily  His updated medication list for this problem includes:    Coreg 3.125 Mg Tabs (Carvedilol) .Marland Kitchen... 1 two times a day    Cozaar 50 Mg Tabs (Losartan potassium) .Marland Kitchen... 1 once daily  Problem # 4:  BENIGN PROSTATIC HYPERTROPHY (ICD-600.00)  Orders: Venipuncture ([address]) TLB-PSA (Prostate Specific Antigen) (84153-PSA)  Complete Medication List: 1)  Ambien 10 Mg Tabs (Zolpidem tartrate) .... Take 1 tablet by mouth at bedtime 2)  Xanax 0.5 Mg Tabs (Alprazolam) .... Take 1 tablet by mouth once a day 3)  Coreg 3.125 Mg Tabs (Carvedilol) .Marland Kitchen.. 1 two times a day 4)  Plavix 75 Mg Tabs (Clopidogrel bisulfate) .Marland Kitchen.. 1 once daily 5)  Cozaar 50 Mg Tabs (Losartan potassium) .Marland Kitchen.. 1 once daily 6)  Allegra 180 Mg Tabs (Fexofenadine hcl) .Marland Kitchen.. 1 once daily as needed 7)  Lipitor 40 Mg Tabs (Atorvastatin calcium) .Marland Kitchen.. 1 once daily 8)  Nitrostat 0.4 Mg Subl (Nitroglycerin) .... Uad 9)  Saw Palmetto 450 Mg Caps (Saw palmetto (serenoa repens)) .... Take 2  capsule by mouth at bedtime 10)  Fish Oil  1200 Mg Caps (Omega-3 fatty acids) .... Take 1 capsule by mouth two times a day 11)  Cranberry 500 Mg Caps (Cranberry) .... One time a day as needed 12)  Tylenol 8 Hour 650 Mg Cr-tabs (Acetaminophen) .... Take 2 am and 2 pm 13)  Bufferin 325 Mg Tabs (Aspirin buf(cacarb-mgcarb-mgo)) .... Take 1 tablet by mouth once a day 14)  Restasis 0.05 % Emul (Cyclosporine) .... Bid  Other Orders: Prescription Created Electronically [id number])  Patient Instructions: 1)  Please schedule a  follow-up appointment in 6 months. 2)  Limit your Sodium (Salt). 3)  It is important that you exercise regularly at least 20 minutes 5 times a week. If you develop chest pain, have severe difficulty breathing, or feel very tired , stop exercising immediately and seek medical attention. Prescriptions: LIPITOR 40 MG TABS (ATORVASTATIN CALCIUM) 1 once daily  #90 x 3   Entered and Authorized by:   Gordy Savers  MD   Signed by:   Gordy Savers  MD on 07/28/2009   Method used:   Electronically to        [facility]  [place] [address] (retail)       [address]       [place], [place]  [address]       Ph: [phone] or [phone]       Fax: [phone]   RxID:   [id number] ALLEGRA 180 MG  TABS (FEXOFENADINE HCL) 1 once daily as needed  #90 x 6   Entered and Authorized by:   Gordy Savers  MD   Signed by:   Gordy Savers  MD on 07/28/2009   Method used:   [facility]        [facility]  [place] [address] ([facility]       [address]       [place], [place]  [address]       Ph: [phone] or [phone]       Fax: [phone]   RxID:   [id number] COZAAR 50 MG  TABS (LOSARTAN POTASSIUM) 1 once daily  #90 x 3   Entered and Authorized by:   Gordy Savers  MD   Signed by:   Gordy Savers  MD on 07/28/2009   Method used:   [facility]        [facility]  [place] [address] (ret[facility]       [address]       [place], [place]  [address]       Ph: [phone] or [phone]       Fax: [phone]   RxID:   [id number] PLAVIX 75 MG  TABS (CLOPIDOGREL BISULFATE) 1 once daily  #90 x 3   Entered and Authorized by:   Gordy Savers  MD   Signed by:   Gordy Savers  MD on 07/28/2009   Method used:   [facility]        [facility]  [place] [address] ([facility]       [address]       [place], [place]  [address]       Ph: [phone] or [phone]       Fax: [phone]   RxID:   [id number] COREG 3.125 MG  TABS (CARVEDILOL) 1 two times a day   #180 x 3   Entered and Authorized by:   Gordy Savers  MD   Signed by:  Gordy Savers  MD on 07/28/2009   Method used:   [facility]        [facility]  [place] [address] ([facility]       [address]       [place], [place]  [address]       Ph: [phone] or [phone]       Fax: [phone]   RxID:   [id number] LIPITOR 40 MG TABS (ATORVASTATIN CALCIUM) 1 once daily  #90 x 3   Entered and Authorized by:   Gordy Savers  MD   Signed by:   Gordy Savers  MD on 07/28/2009   Method used:   Print then Give to Patient   RxID:   [id number] ALLEGRA 180 MG  TABS (FEXOFENADINE HCL) 1 once daily as needed  #90 x 6   Entered and Authorized by:   Gordy Savers  MD   Signed by:   Gordy Savers  MD on 07/28/2009   Method used:   Print then Give to Patient   RxID:   [id number] COZAAR 50 MG  TABS (LOSARTAN POTASSIUM) 1 once daily  #90 x 3   Entered and Authorized by:   Gordy Savers  MD   Signed by:   Gordy Savers  MD on 07/28/2009   Method used:   Print then Give to Patient   RxID:   [id number] PLAVIX 75 MG  TABS (CLOPIDOGREL BISULFATE) 1 once daily  #90 x 3   Entered and Authorized by:   Gordy Savers  MD   Signed by:   Gordy Savers  MD on 07/28/2009   Method used:   Print then Give to Patient   RxID:   [id number] COREG 3.125 MG  TABS (CARVEDILOL) 1 two times a day  #180 x 3   Entered and Authorized by:   Gordy Savers  MD   Signed by:   Gordy Savers  MD on 07/28/2009   Method used:   Print then Give to Patient   RxID:   [id number] Prudy Feeler 0.5 MG TABS (ALPRAZOLAM) Take 1 tablet by mouth once a day  #90 x 6   Entered and Authorized by:   Gordy Savers  MD   Signed by:   Gordy Savers  MD on 07/28/2009   Method used:   Print then Give to Patient   RxID:   [id number] AMBIEN 10 MG TABS (ZOLPIDEM TARTRATE) Take 1 tablet by mouth at bedtime  #90 x 6   Entered and Authorized  by:   Gordy Savers  MD   Signed by:   Gordy Savers  MD on 07/28/2009   Method used:   Print then Give to Patient   RxID:   [id number]  A  Appended Document: FOLLOW UP/CB    CBG Result 102   Allergies: 1)  ! * Anti-Depressants   Complete Medication List: 1)  Ambien 10 Mg Tabs (Zolpidem tartrate) .... Take 1 tablet by mouth at bedtime 2)  Xanax 0.5 Mg Tabs (Alprazolam) .... Take 1 tablet by mouth once a day 3)  Coreg 3.125 Mg Tabs (Carvedilol) .Marland Kitchen.. 1 two times a day 4)  Plavix 75 Mg Tabs (Clopidogrel bisulfate) .Marland Kitchen.. 1 once daily 5)  Cozaar 50 Mg Tabs (Losartan potassium) .Marland Kitchen.. 1 once daily 6)  Allegra 180 Mg Tabs (Fexofenadine hcl) .Marland Kitchen.. 1 once daily as needed 7)  Lipitor 40 Mg Tabs (Atorvastatin calcium) .Marland KitchenMarland KitchenMarland Kitchen  1 once daily 8)  Nitrostat 0.4 Mg Subl (Nitroglycerin) .... Uad 9)  Saw Palmetto 450 Mg Caps (Saw palmetto (serenoa repens)) .... Take 2  capsule by mouth at bedtime 10)  Fish Oil 1200 Mg Caps (Omega-3 fatty acids) .... Take 1 capsule by mouth two times a day 11)  Cranberry 500 Mg Caps (Cranberry) .... One time a day as needed 12)  Tylenol 8 Hour 650 Mg Cr-tabs (Acetaminophen) .... Take 2 am and 2 pm 13)  Bufferin 325 Mg Tabs (Aspirin buf(cacarb-mgcarb-mgo)) .... Take 1 tablet by mouth once a day 14)  Restasis 0.05 % Emul (Cyclosporine) .... Bid  Other Orders: Capillary Blood Glucose/CBG [phone])

## 2010-06-13 NOTE — Miscellaneous (Signed)
Summary: chart update  Clinical Lists Changes  Observations: Added new observation of ECHOINTERP:   Study Conclusions    - Left ventricle: Distal anterior wall, septal and apical     hypokinesis The cavity size was mildly dilated. The estimated     ejection fraction was 45%.   - Atrial septum: No defect or patent foramen ovale was identified.   Transthoracic echocardiography. M-mode, complete 2D, spectral   Doppler, and color Doppler. Height: Height: 167.6cm. Height: 66in.   Weight: Weight: 61.7kg. Weight: 135.7lb. Body mass index: BMI:   22kg/m^2. Body surface area: BSA: 1.34m^2. Blood pressure: 122/71.   Patient status: Outpatient. Location: [facility]  Prepared and Electronically Authenticated by    Charlton Haws  (03/07/2010 10:42)      Echocardiogram  Procedure date:  03/07/2010  Findings:        Study Conclusions    - Left ventricle: Distal anterior wall, septal and apical     hypokinesis The cavity size was mildly dilated. The estimated     ejection fraction was 45%.   - Atrial septum: No defect or patent foramen ovale was identified.   Transthoracic echocardiography. M-mode, complete 2D, spectral   Doppler, and color Doppler. Height: Height: 167.6cm. Height: 66in.   Weight: Weight: 61.7kg. Weight: 135.7lb. Body mass index: BMI:   22kg/m^2. Body surface area: BSA: 1.55m^2. Blood pressure: 122/71.   Patient status: Outpatient. Location: [facility]  Prepared and Electronically Authenticated by    Charlton Haws

## 2010-06-13 NOTE — Letter (Signed)
[facility]   Imported By: Maryln Gottron 10/11/2009 10:32:21  _____________________________________________________________________  External Attachment:    Type:   Image     Comment:   External Document

## 2010-06-13 NOTE — Assessment & Plan Note (Signed)
Summary: f3m   Visit Type:  Follow-up Referring Provider:  Dr. Charlies Constable Primary Provider:  Gordy Savers  MD  CC:  Occasional episodes of lightheadedness. A few weeks ago he was in [place] and became diaphoretic- after eating he usually feels better.Marland Kitchen  History of Present Illness: [name] is 70 years old and return for management of CAD. In January of 2009 he had an anterior MI treated with a drug-eluting stent to the LAD. His ejection fraction improved from 25% to 40-50%. On his previous echo after that he was 55-60% but on an echocardiogram performed a few days before today's visit he was 45%.  [place]. He's had no chest pain shortness breath or palpitations. He did have an episode 2 weeks ago of diaphoresis and weakness which were relieved by eating. He has had similar episodes to this in the past but not so severe.  His other problems include hypertension, hyperlipidemia, idiopathic thrombocytopenia, and obstructive sleep apnea.  Current Medications (verified): 1)  Ambien 10 Mg Tabs (Zolpidem Tartrate) .... Take 1 Tablet By Mouth At Bedtime As Needed 2)  Xanax 0.5 Mg Tabs (Alprazolam) .... Take 1 Tablet By Mouth Once A Day 3)  Coreg 3.125 Mg  Tabs (Carvedilol) .Marland Kitchen.. 1 Two Times A Day 4)  Plavix 75 Mg  Tabs (Clopidogrel Bisulfate) .Marland Kitchen.. 1 Once Daily 5)  Cozaar 50 Mg  Tabs (Losartan Potassium) .Marland Kitchen.. 1 Once Daily 6)  Allegra 180 Mg  Tabs (Fexofenadine Hcl) .Marland Kitchen.. 1 Once Daily As Needed 7)  Lipitor 40 Mg Tabs (Atorvastatin Calcium) .Marland Kitchen.. 1 Once Daily 8)  Nitrostat 0.4 Mg Subl (Nitroglycerin) .... Uad 9)  Saw Palmetto 450 Mg Caps (Saw Palmetto (Serenoa Repens)) .... Take 2  Capsule By Mouth At Bedtime 10)  Fish Oil 1200 Mg Caps (Omega-3 Fatty Acids) .... Take 1 Capsule By Mouth Two Times A Day 11)  Tylenol 8 Hour 650 Mg Cr-Tabs (Acetaminophen) .... Take 2 Am and 2 Pm 12)  Restasis 0.05 % Emul (Cyclosporine) .... Bid 13)   Vitamin D3 1000 Unit Tabs (Cholecalciferol) .... Take One Tab By Mouth At Bedtime 14)  Aspirin 81 Mg Tbec (Aspirin) .... Take One Tablet By Mouth Daily 15)  Fexofenadine Hcl 180 Mg Tabs (Fexofenadine Hcl) .... Take One Tablet By Mouth Once Daily  Allergies (verified): 1)  ! * Anti-Depressants  Past History:  Past Medical History: Reviewed history from 07/27/2008 and no changes required. Coronary artery disease status post angioplasty of a totally occluded LAD January 2009 Allergic rhinitis Diverticulosis, colon Hyperlipidemia Hypertension Nephrolithiasis, hx of Osteoarthritis Benign prostatic hypertrophy mild chronic ITP  1. Status post anterior wall myocardial infarction on May 29, 2007, treated with a PROMUS stent to the left anterior descending. 2. Ejection fraction 25% improved to 40-45% by echocardiography. 3. Hyperlipidemia. 4. Question obstructive sleep apnea. 5. History of thrombocytopenia.   Review of Systems       ROS is negative except as outlined in HPI.   Vital Signs:  Patient profile:   70 year old male Weight:      139 pounds Pulse rate:   52 / minute BP sitting:   122 / 66  (left arm) Cuff size:   regular  Vitals Entered By: Sherri Rad, RN, BSN (March 16, 2010 2:23 PM)  Physical Exam  Additional Exam:  Gen. Well-nourished, in no distress   Neck: No JVD, thyroid not enlarged,  no carotid bruits Lungs: No tachypnea, clear without rales, rhonchi or wheezes Cardiovascular: Rhythm regular, PMI not displaced,  heart sounds  normal, no murmurs or gallops, no peripheral edema, pulses normal in all 4 extremities. Abdomen: BS normal, abdomen soft and non-tender without masses or organomegaly, no hepatosplenomegaly. MS: No deformities, no cyanosis or clubbing   Neuro:  No focal sns   Skin:  no lesions    Impression & Recommendations:  Problem # 1:  CORONARY ATHEROSCLEROSIS NATIVE CORONARY ARTERY (ICD-414.01) He had a previous anterior wall  MI treated with a DES to the LAD. He has an ejection fraction of 45%. He's had no chest pain and this prompted stable. The following medications were removed from the medication list:    Bufferin 325 Mg Tabs (Aspirin buf(cacarb-mgcarb-mgo)) .Marland Kitchen... Take 1 tablet by mouth once a day His updated medication list for this problem includes:    Coreg 3.125 Mg Tabs (Carvedilol) .Marland Kitchen... 1 two times a day    Plavix 75 Mg Tabs (Clopidogrel bisulfate) .Marland Kitchen... 1 once daily    Nitrostat 0.4 Mg Subl (Nitroglycerin) ..... Uad    Aspirin 81 Mg Tbec (Aspirin) .Marland Kitchen... Take one tablet by mouth daily  Problem # 2:  HYPERTENSION (ICD-401.9) This is well controlled on current medications. The following medications were removed from the medication list:    Bufferin 325 Mg Tabs (Aspirin buf(cacarb-mgcarb-mgo)) .Marland Kitchen... Take 1 tablet by mouth once a day His updated medication list for this problem includes:    Coreg 3.125 Mg Tabs (Carvedilol) .Marland Kitchen... 1 two times a day    Cozaar 50 Mg Tabs (Losartan potassium) .Marland Kitchen... 1 once daily    Aspirin 81 Mg Tbec (Aspirin) .Marland Kitchen... Take one tablet by mouth daily  Problem # 3:  HYPERLIPIDEMIA (ICD-272.4) Hhe had a Lipid profile with an HDL of 52 and LDL of 40.    Lipitor 40 Mg Tabs (Atorvastatin calcium) .Marland Kitchen... 1 once daily  Patient Instructions: 1)  Your physician recommends that you continue on your current medications as directed. Please refer to the Current Medication list given to you today. 2)  Your physician wants you to follow-up in: 6 months with Dr. Clifton James.  You will receive a reminder letter in the mail two months in advance. If you don't receive a letter, please call our office to schedule the follow-up appointment. Prescriptions: NITROSTAT 0.4 MG SUBL (NITROGLYCERIN) UAD  #25 x 3   Entered by:   Sherri Rad, RN, BSN   Authorized by:   Lenoria Farrier, MD, [facility]   Signed by:   Sherri Rad, RN, BSN on 03/16/2010   Method used:   Print then Give to Patient   RxID:    [id number] LIPITOR 40 MG TABS (ATORVASTATIN CALCIUM) 1 once daily  #90 x 3   Entered by:   Sherri Rad, RN, BSN   Authorized by:   Lenoria Farrier, MD, [facility]   Signed by:   Sherri Rad, RN, BSN on 03/16/2010   Method used:   Print then Give to Patient   RxID:   [id number] COZAAR 50 MG  TABS (LOSARTAN POTASSIUM) 1 once daily  #90 x 3   Entered by:   Sherri Rad, RN, BSN   Authorized by:   Lenoria Farrier, MD, [employer]   Signed by:   Sherri Rad, RN, BSN on 03/16/2010   Method used:   Print then Give to Patient   RxID:   [id number] PLAVIX 75 MG  TABS (CLOPIDOGREL BISULFATE) 1 once daily  #  90 x 3   Entered by:   Sherri Rad, RN, BSN   Authorized by:   Lenoria Farrier, MD, [facility]   Signed by:   Sherri Rad, RN, BSN on 03/16/2010   Method used:   Print then Give to Patient   RxID:   [id number] COREG 3.125 MG  TABS (CARVEDILOL) 1 two times a day  #180 x 3   Entered by:   Sherri Rad, RN, BSN   Authorized by:   Lenoria Farrier, MD, [facility]   Signed by:   Sherri Rad, RN, BSN on 03/16/2010   Method used:   Print then Give to Patient   RxID:   [id number]

## 2010-06-13 NOTE — Procedures (Signed)
Summary: Colonoscopy / [facility]  Colonoscopy / [facility]   Imported By: Lennie Odor 10/28/2009 16:14:36  _____________________________________________________________________  External Attachment:    Type:   Image     Comment:   External Document

## 2010-06-13 NOTE — Progress Notes (Signed)
Summary: does pt need labwork?  Phone Note Call from Patient   Caller: Patient [phone] Reason for Call: Talk to Nurse Summary of Call: pt has appt 10-25 with dr Hameed Kolar-does he need labwork that day as well? if so does he need, and diagnosis?  Initial call taken by: Glynda Jaeger,  December 12, 2009 11:43 AM  Follow-up for Phone Call        spoke with pt.  He wants to make sure he doesn't need any labs prior to his follow up appoinment.  He is coming in for an echo on 10/25 and can get labs then if needed.  I told pt I would foward this to Dr Juanda Chance and his nurse but that  I did not see that he needed labs they were just doen in April Dennis Bast, RN, BSN  December 12, 2009 11:57 AM     Appended Document: does pt need labwork? Per Dr. Juanda Chance- check a bmet/cbc/lipid/liver. The pt is aware and an order has been placed for 10/25 when he comes for his echo.

## 2010-08-03 ENCOUNTER — Encounter: Payer: Self-pay | Admitting: Internal Medicine

## 2010-08-04 ENCOUNTER — Ambulatory Visit (INDEPENDENT_AMBULATORY_CARE_PROVIDER_SITE_OTHER): Payer: Medicare Other | Admitting: Internal Medicine

## 2010-08-04 ENCOUNTER — Encounter: Payer: Self-pay | Admitting: Internal Medicine

## 2010-08-04 DIAGNOSIS — I2589 Other forms of chronic ischemic heart disease: Secondary | ICD-10-CM

## 2010-08-04 DIAGNOSIS — I2510 Atherosclerotic heart disease of native coronary artery without angina pectoris: Secondary | ICD-10-CM

## 2010-08-04 DIAGNOSIS — N400 Enlarged prostate without lower urinary tract symptoms: Secondary | ICD-10-CM

## 2010-08-04 DIAGNOSIS — I10 Essential (primary) hypertension: Secondary | ICD-10-CM

## 2010-08-04 DIAGNOSIS — E785 Hyperlipidemia, unspecified: Secondary | ICD-10-CM

## 2010-08-04 LAB — TSH: TSH: 1.66 u[IU]/mL (ref 0.35–5.50)

## 2010-08-04 LAB — PSA: PSA: 1.17 ng/mL (ref 0.10–4.00)

## 2010-08-04 MED ORDER — CLOPIDOGREL BISULFATE 75 MG PO TABS: 75.0000 mg | ORAL_TABLET | Freq: Every day | ORAL | Status: DC

## 2010-08-04 MED ORDER — LOSARTAN POTASSIUM 50 MG PO TABS: 50.0000 mg | ORAL_TABLET | Freq: Every day | ORAL | Status: DC

## 2010-08-04 MED ORDER — ATORVASTATIN CALCIUM 40 MG PO TABS: 40.0000 mg | ORAL_TABLET | Freq: Every day | ORAL | Status: DC

## 2010-08-04 MED ORDER — ZOLPIDEM TARTRATE 10 MG PO TABS: 10.0000 mg | ORAL_TABLET | Freq: Every evening | ORAL | Status: DC | PRN

## 2010-08-04 MED ORDER — ALPRAZOLAM 0.5 MG PO TABS: 0.5000 mg | ORAL_TABLET | Freq: Every evening | ORAL | Status: DC | PRN

## 2010-08-04 MED ORDER — DOXYCYCLINE HYCLATE 100 MG PO TABS: 100.0000 mg | ORAL_TABLET | Freq: Two times a day (BID) | ORAL | Status: AC

## 2010-08-04 MED ORDER — CARVEDILOL 3.125 MG PO TABS: 3.1250 mg | ORAL_TABLET | Freq: Two times a day (BID) | ORAL | Status: DC

## 2010-08-04 NOTE — Progress Notes (Signed)
Subjective:    Patient ID: [name], male    DOB: [birth year], 70 y.o.   MRN: [id number]  HPI   70 year old patient who is seen today for an annual examination. He is followed closely by cardiology due to 2 coronary artery disease status post MI and ischemic cardiomyopathy. He has an ejection fraction of approximately 45%. He has hypertension and dyslipidemia. He is doing quite well today without concerns or complaints. He has had some chronic hoarseness and was evaluated by ENT about 6 months ago.  1. Risk factors, based on past  M,S,F history- Patient has known coronary artery disease risk factors include hypertension and dyslipidemia  2.  Physical activities: remains quite active physically goes to the health club 2 or 3 times weekly and does yard work without major limitations 3.  Depression/mood: no history depression or mood disorder  4.  Hearing: no deficits  5.  ADL's: independent in all aspects of daily living  6.  Fall risk: low  7.  Home safety: no problems identified  8.  Height weight, and visual acuity; height and weight stable no change in visual acuity  9.  Counseling: heart healthy diet regular exercise and restricted salt diet all encouraged  10. Lab orders based on risk factors: we'll check a PSA and TSH. Other laboratory studies will be done next visit 11. Referral: we'll need screening colonoscopy next year  12. Care plan: continue heart healthy diet regular exercise. Medical regimen will be unchanged 13. Cognitive assessment:  Alert and oriented with normal affect. No cognitive dysfunction      Review of Systems  Constitutional: Negative for fever, chills, activity change, appetite change and fatigue.  HENT: Negative for hearing loss, ear pain, congestion, rhinorrhea, sneezing, mouth sores, trouble swallowing, neck pain, neck stiffness, dental problem, voice change, sinus pressure and tinnitus.   Eyes: Negative for photophobia, pain, redness and visual  disturbance.  Respiratory: Negative for apnea, cough, choking, chest tightness, shortness of breath and wheezing.   Cardiovascular: Negative for chest pain, palpitations and leg swelling.  Gastrointestinal: Negative for nausea, vomiting, abdominal pain, diarrhea, constipation, blood in stool, abdominal distention, anal bleeding and rectal pain.  Genitourinary: Negative for dysuria, urgency, frequency, hematuria, flank pain, decreased urine volume, discharge, penile swelling, scrotal swelling, difficulty urinating, genital sores and testicular pain.  Musculoskeletal: Negative for myalgias, back pain, joint swelling, arthralgias and gait problem.  Skin: Negative for color change, rash and wound.  Neurological: Negative for dizziness, tremors, seizures, syncope, facial asymmetry, speech difficulty, weakness, light-headedness, numbness and headaches.  Hematological: Negative for adenopathy. Does not bruise/bleed easily.  Psychiatric/Behavioral: Negative for suicidal ideas, hallucinations, behavioral problems, confusion, sleep disturbance, self-injury, dysphoric mood, decreased concentration and agitation. The patient is not nervous/anxious.        Objective:   Physical Exam  Constitutional: He appears well-developed and well-nourished.  HENT:  Head: Normocephalic and atraumatic.  Right Ear: External ear normal.  Left Ear: External ear normal.  Nose: Nose normal.  Mouth/Throat: Oropharynx is clear and moist.  Eyes: Conjunctivae and EOM are normal. Pupils are equal, round, and reactive to light. No scleral icterus.  Neck: Normal range of motion. Neck supple. No JVD present. No thyromegaly present.  Cardiovascular: Regular rhythm, normal heart sounds and intact distal pulses.  Exam reveals no gallop and no friction rub.   No murmur heard. Pulmonary/Chest: Effort normal and breath sounds normal. He exhibits no tenderness.  Abdominal: Soft. Bowel sounds are normal. He exhibits no distension and no  mass. There is no tenderness.  Genitourinary: Rectum normal and penis normal.        Prostate +2 enlarged  Musculoskeletal: Normal range of motion. He exhibits no edema and no tenderness.  Lymphadenopathy:    He has no cervical adenopathy.  Neurological: He is alert. He has normal reflexes. No cranial nerve deficit. Coordination normal.  Skin: Skin is warm and dry. No rash noted.  Psychiatric: He has a normal mood and affect. His behavior is normal.          Assessment & Plan:   preventive health examination. We'll check selective labs today and do followup colonoscopy in one year  Coronary artery disease with ischemic cardiopathy stable  Hypertension stable  Dyslipidemia

## 2010-08-04 NOTE — Patient Instructions (Signed)
Limit your sodium (Salt) intake    It is important that you exercise regularly, at least 20 minutes 3 to 4 times per week.  If you develop chest pain or shortness of breath seek  medical attention.  Return in 6 months for follow-up  

## 2010-08-04 NOTE — Progress Notes (Signed)
Addended by: Eleonore Chiquito on: 08/04/2010 10:33 AM   Modules accepted: Orders

## 2010-08-11 ENCOUNTER — Telehealth: Payer: Self-pay

## 2010-08-11 NOTE — Telephone Encounter (Signed)
Attempt to call pt - [name] - [name] if questions labs WLN KIK

## 2010-09-05 ENCOUNTER — Other Ambulatory Visit: Payer: Self-pay | Admitting: Oncology

## 2010-09-05 ENCOUNTER — Encounter (HOSPITAL_BASED_OUTPATIENT_CLINIC_OR_DEPARTMENT_OTHER): Payer: Medicare Other | Admitting: Oncology

## 2010-09-05 DIAGNOSIS — I2510 Atherosclerotic heart disease of native coronary artery without angina pectoris: Secondary | ICD-10-CM

## 2010-09-05 DIAGNOSIS — I2109 ST elevation (STEMI) myocardial infarction involving other coronary artery of anterior wall: Secondary | ICD-10-CM

## 2010-09-05 DIAGNOSIS — D693 Immune thrombocytopenic purpura: Secondary | ICD-10-CM

## 2010-09-05 DIAGNOSIS — D126 Benign neoplasm of colon, unspecified: Secondary | ICD-10-CM

## 2010-09-05 DIAGNOSIS — I252 Old myocardial infarction: Secondary | ICD-10-CM

## 2010-09-05 DIAGNOSIS — D696 Thrombocytopenia, unspecified: Secondary | ICD-10-CM

## 2010-09-05 LAB — CBC WITH DIFFERENTIAL/PLATELET
BASO%: 0.4 % (ref 0.0–2.0)
EOS%: 2.3 % (ref 0.0–7.0)
MCH: 32.9 pg (ref 27.2–33.4)
MCHC: 33 g/dL (ref 32.0–36.0)
MONO#: 0.3 10*3/uL (ref 0.1–0.9)
RDW: 13.1 % (ref 11.0–14.6)
WBC: 6.2 10*3/uL (ref 4.0–10.3)
lymph#: 1.3 10*3/uL (ref 0.9–3.3)

## 2010-09-08 ENCOUNTER — Other Ambulatory Visit (INDEPENDENT_AMBULATORY_CARE_PROVIDER_SITE_OTHER): Payer: Medicare Other | Admitting: *Deleted

## 2010-09-08 DIAGNOSIS — E7800 Pure hypercholesterolemia, unspecified: Secondary | ICD-10-CM

## 2010-09-08 DIAGNOSIS — I2510 Atherosclerotic heart disease of native coronary artery without angina pectoris: Secondary | ICD-10-CM

## 2010-09-08 LAB — HEPATIC FUNCTION PANEL
Albumin: 3.7 g/dL (ref 3.5–5.2)
Alkaline Phosphatase: 53 U/L (ref 39–117)
Total Bilirubin: 1.3 mg/dL — ABNORMAL HIGH (ref 0.3–1.2)

## 2010-09-08 LAB — CBC WITH DIFFERENTIAL/PLATELET
Basophils Relative: 0.5 % (ref 0.0–3.0)
Eosinophils Relative: 3.1 % (ref 0.0–5.0)
HCT: 43.3 % (ref 39.0–52.0)
Hemoglobin: 14.8 g/dL (ref 13.0–17.0)
MCV: 99.9 fl (ref 78.0–100.0)
Monocytes Absolute: 0.4 10*3/uL (ref 0.1–1.0)
Neutro Abs: 2.7 10*3/uL (ref 1.4–7.7)
Neutrophils Relative %: 56.6 % (ref 43.0–77.0)
RBC: 4.34 Mil/uL (ref 4.22–5.81)
WBC: 4.7 10*3/uL (ref 4.5–10.5)

## 2010-09-08 LAB — LIPID PANEL
LDL Cholesterol: 39 mg/dL (ref 0–99)
Total CHOL/HDL Ratio: 2
Triglycerides: 46 mg/dL (ref 0.0–149.0)

## 2010-09-08 LAB — BASIC METABOLIC PANEL
Chloride: 106 mEq/L (ref 96–112)
Potassium: 4 mEq/L (ref 3.5–5.1)
Sodium: 141 mEq/L (ref 135–145)

## 2010-09-12 ENCOUNTER — Ambulatory Visit (INDEPENDENT_AMBULATORY_CARE_PROVIDER_SITE_OTHER): Payer: Medicare Other | Admitting: Cardiovascular Disease

## 2010-09-12 ENCOUNTER — Encounter: Payer: Self-pay | Admitting: Cardiovascular Disease

## 2010-09-12 VITALS — BP 123/72 | HR 57 | Resp 14 | Ht 66.0 in | Wt 143.0 lb

## 2010-09-12 DIAGNOSIS — I2510 Atherosclerotic heart disease of native coronary artery without angina pectoris: Secondary | ICD-10-CM

## 2010-09-12 DIAGNOSIS — E785 Hyperlipidemia, unspecified: Secondary | ICD-10-CM

## 2010-09-12 DIAGNOSIS — I2589 Other forms of chronic ischemic heart disease: Secondary | ICD-10-CM

## 2010-09-12 MED ORDER — ATORVASTATIN CALCIUM 20 MG PO TABS: 20.0000 mg | ORAL_TABLET | Freq: Every day | ORAL | Status: DC

## 2010-09-12 NOTE — Assessment & Plan Note (Signed)
Well controlled. He is asking to reduce his dose to 20 mg per day. Repeat lipids and LFTs in 6 months.

## 2010-09-12 NOTE — Assessment & Plan Note (Signed)
Stable No changes 

## 2010-09-12 NOTE — Assessment & Plan Note (Signed)
No changes. Volume status ok.

## 2010-09-12 NOTE — Progress Notes (Signed)
History of Present Illness:70 yo WM with history of CAD, HTN, hyperlipidemia, idiopathic thrombocytopenia and OSA here today for cardiac follow up. He has been followed in the past by Dr. Juanda Chance. In January of 2009 he had an anterior MI treated with a drug-eluting stent to the LAD. His ejection fraction improved from 25% to 40-50%. Most recent echo with EF of 45%.   He has been doing well. No chest pain, SOB, palpitations, dizziness, near syncope or syncope.    Past Medical History  Diagnosis Date  . Allergy   . CAD (coronary artery disease)   . Hypertension   . Hyperlipidemia   . Arthritis   . Diverticulosis of colon   . Benign prostatic hypertrophy   . Myocardial infarction   . Thrombocytopenia     Past Surgical History  Procedure Date  . Vasectomy   . Tonsillectomy   . Lumbar laminectomy   . Ptca     Current Outpatient Prescriptions  Medication Sig Dispense Refill  . acetaminophen (TYLENOL) 650 MG CR tablet Take 650 mg by mouth every 8 (eight) hours as needed.        . ALPRAZolam (XANAX) 0.5 MG tablet Take 1 tablet (0.5 mg total) by mouth at bedtime as needed.  60 tablet  2  . aspirin 81 MG tablet Take 81 mg by mouth daily.        Marland Kitchen atorvastatin (LIPITOR) 40 MG tablet Take 1 tablet (40 mg total) by mouth daily.  90 tablet  6  . carvedilol (COREG) 3.125 MG tablet Take 1 tablet (3.125 mg total) by mouth 2 (two) times daily with a meal.  90 tablet  6  . Cholecalciferol (VITAMIN D3) 1000 UNITS tablet Take 1,000 Units by mouth daily.        . clopidogrel (PLAVIX) 75 MG tablet Take 1 tablet (75 mg total) by mouth daily.  90 tablet  6  . losartan (COZAAR) 50 MG tablet Take 1 tablet (50 mg total) by mouth daily.  90 tablet  6  . nitroGLYCERIN (NITROSTAT) 0.4 MG SL tablet Place 0.4 mg under the tongue every 5 (five) minutes as needed.        . Omega-3 Fatty Acids (FISH OIL) 1200 MG CAPS Take 1,200 mg by mouth 2 (two) times daily.        . Saw Palmetto 450 MG CAPS Take 450 mg by mouth  2 (two) times daily. 2 caps bid       . Ubiquinol 100 MG CAPS Take 1 capsule by mouth daily.        Marland Kitchen zolpidem (AMBIEN) 10 MG tablet Take 1 tablet (10 mg total) by mouth at bedtime as needed.  90 tablet  2  . DISCONTD: cycloSPORINE (RESTASIS) 0.05 % ophthalmic emulsion 1 drop 2 (two) times daily.        Marland Kitchen DISCONTD: fexofenadine (ALLEGRA) 180 MG tablet Take 180 mg by mouth daily.          Not on FileAntidepressants.  History   Social History  . Marital Status: Married    Spouse Name: N/A    Number of Children: N/A  . Years of Education: N/A   Occupational History  . Not on file.   Social History Main Topics  . Smoking status: Never Smoker   . Smokeless tobacco: Not on file  . Alcohol Use:   . Drug Use:   . Sexually Active:    Other Topics Concern  . Not on file   Social  History Narrative  . No narrative on file    Family History  Problem Relation Age of Onset  . Alzheimer's disease Mother   . Lymphoma Father   . Coronary artery disease Brother   . COPD Brother   . Diabetes Brother   . Cancer      prostate, colon, lung    Review of Systems:  As stated in the HPI and otherwise negative.   BP 123/72  Pulse 57  Resp 14  Ht 5\' 6"  (1.676 m)  Wt 143 lb (64.864 kg)  BMI 23.08 kg/m2  Physical Examination: General: Well developed, well nourished, NAD HEENT: OP clear, mucus membranes moist SKIN: warm, dry. No rashes. Neuro: No focal deficits Musculoskeletal: Muscle strength 5/5 all ext Psychiatric: Mood and affect normal Neck: No JVD, no carotid bruits, no thyromegaly, no lymphadenopathy. Lungs:Clear bilaterally, no wheezes, rhonci, crackles Cardiovascular: Regular rate and rhythm. No murmurs, gallops or rubs. Abdomen:Soft. Bowel sounds present. Non-tender.  Extremities: No lower extremity edema. Pulses are 2 + in the bilateral DP/PT.  EKG: Sinus bradycardia, rate 57 bpm. Poor R wave progression through the precordial leads.

## 2010-09-12 NOTE — Progress Notes (Signed)
Addended by: Floreen Comber on: 09/12/2010 03:55 PM   Modules accepted: Orders

## 2010-09-12 NOTE — Patient Instructions (Signed)
Your physician recommends that you schedule a follow-up appointment in: 6 months with [facility] with fasting blood work at this time.   Your physician has recommended you make the following change in your medication: Decrease Lipitor to 20 mg daily.

## 2010-09-12 NOTE — Progress Notes (Signed)
Will fax prescription to pharmacy

## 2010-09-26 NOTE — Assessment & Plan Note (Signed)
Patient follows up today.  He has a history of increasing hip pain which  seems to be unconnected to his back pain.  He has had no falls, no other  trauma.  He has had hip pain in the past and in fact say Orthopedist but  this was many years ago and had x-rays at one point but this is at an  Orthopedic office and more than 7 years ago.  He has had some relief  with Limbrel samples 250 twice a day in terms of the back pain and hip  pain but pain is still interfering with his activity levels.  His pain  is worse with walking, bending as well as prolonged standing and as well  as prolonged sitting, improves with rest, heat and medications.   REVIEW OF SYSTEMS:  Positive for numbness in the left knee, spasms and  anxiety.   EXAMINATION:  No acute distress.  Mood and affect appropriate.  Gait is  normal.  His internal rotation is limited in the right greater than the  left knee, external rotation is preserved.  Faber's test shows some mild  pain in the hip area bilaterally.  Deep tendon reflexes are normal.  Strength is normal in lower extremities.   IMPRESSION:  Hip contracture likely secondary to hip OA.   PLAN:  (1)  We will go ahead and check two views of bilateral hips.  Expect the right side to look worse than the left side.  (2)  Increase  Limbrel to 500 twice a day.      Erick Colace, M.D.  Electronically Signed     AEK/MedQ  D:  04/15/2007 15:06:13  T:  04/15/2007 15:16:53  Job #:  [id number]

## 2010-09-26 NOTE — H&P (Signed)
NAMEANDRAE, CLAUNCH                   ACCOUNT NO.:  [id number]   MEDICAL RECORD NO.:  [id number]          PATIENT TYPE:  OBV   LOCATION:  2005                         FACILITY:  [facility]   PHYSICIAN:  Pricilla Riffle, MD, FACCDATE OF BIRTH:  05-12-1941   DATE OF ADMISSION:  06/28/2007  DATE OF DISCHARGE:                              HISTORY & PHYSICAL   PRIMARY CARDIOLOGIST:  [name], MD, [facility].   PRIMARY CARE PHYSICIAN:  Gordy Savers, M.D.   CHIEF COMPLAINT:  Chest tightness.   HISTORY OF PRESENT ILLNESS:  This is a 70 year old male with a past  medical history of an anterior MI one month prior to admission, status  post stent who was in his baseline state of health until 1 p.m. to day  of admission, eight hours prior to admission, when he developed chest  tightness that was described as a 1 out of 10, similar in location to  his prior MI, but not nearly as painful during his MI one month ago when  it was a 12 out of 10, and according to him, when this pain occurred, he  was not doing anything.  He was just sitting in the chair.  He describes  the symptoms as intermittent.  They come and they go.  Three hours later  he went for a walk with his wife and did not have any symptoms  associated with this walk.  But by 1800 he was sitting in the chair  again and when he got up from the chair to go to the kitchen, he  developed lightheadedness immediately after raising up from the chair.  These symptoms persisted while he walked across the house.  Then he felt  that he was getting a tingling feeling in his feet and toes and a  strange feeling in his hands, more like a cold feeling.  He described  his lightheadedness as if is head was spinning, but he never felt like  he was going to pass out.  Because of these symptoms, he contacted EMS  and when they arrived on the scene, they found his heart rate was at  first 60, but then dropped to 40.  He was given aspirin and  nitroglycerin  by the ambulance and the lightheadedness in his hands and  feet had already gone away.  His chest tightness that was 1 out of 10  resolved with his receiving nitroglycerin.  He denies any shortness of  breath with his symptoms.  He denies any diaphoresis.  He denies any  nausea as well.  He also states he has not missed any of his  medications.   PAST MEDICAL HISTORY:  Status post acute anterior myocardial infarction  with total occlusion of the LAD and status post reperfusion and stent in  the proximal LAD with stenosis from 100% to 0% with improvement to TIMI-  3 flow with a drug-eluting stent.  This was done on May 29, 2007.  On the cardiac catheterization, there was 50% narrowing in the mid  circumflex artery, 30% proximal, 50% mid stenosis  in the right coronary  artery and apical wall dyskinesis and anterolateral wall akinesis with  an estimated EF of 25%.  He had a 2-D echo during this hospitalization  as well following the stentinig procedure and the ejection fraction that  improved to 45% as well as history of hyperlipidemia.   MEDICATIONS:  1. Aspirin 325 mg once a day.  2. Plavix 75mg  once a day.  3. Lipitor 80 mg once a day.  4. Coreg 6.25 mg twice daily.  5. Lisinopril from his prior admission was stopped because of his      development of a cough and he was started on Cozaar 50 mg p.o. once      a day.  6. Allegra 180 mg once a day.  7. Saw palmetto 450 mg in the morning.  8. Cranberry 500 mg at night.   ALLERGIES:  He has no specific allergies but ACE INHIBITORs cause him to  have a cough.   SOCIAL HISTORY:  He lives with his wife in [place].  He is retired.  He does not smoke or drink alcohol.  His wife is status post breast  cancer 16 months ago.   FAMILY HISTORY:  Noncontributory.   REVIEW OF SYSTEMS:  Essentially negative with no GI, GU, endocrine,  neuro, psych symptoms as well as no additional cardiopulmonary symptoms.   PHYSICAL EXAMINATION:   VITAL SIGNS:  Temperature 97.5, pulse 60,  respiratory rate 20, blood pressure 105/64, oxygen saturation 100% on  room air.  GENERAL:  He was in no apparent distress.  NECK:  Supple with no bruits, no JVD.  He had no lymphadenopathy.  CARDIOVASCULAR:  S1 and S2.  No murmurs.  Regular rate and rhythm.  LUNGS:  Clear to auscultation bilaterally with no wheezes or crackles.  ABDOMEN:  Soft, nontender.  Normal bowel sounds.  EXTREMITIES:  He had no lower extremity edema.  NEUROLOGIC:  Alert and oriented x3.  Cranial nerves II-XII intact.   A chest x-Piatek was negative.  An EKG showed some questionable ST  depression in lead II and was otherwise unchanged from prior EKGs.   LABORATORY DATA:  White blood cell count 7.2, hemoglobin 15, hematocrit  45, platelets 111.  Sodium 139, potassium 4.1, chloride 108, bicarb 23,  BUN 12, creatinine 0.9, glucose 90.  CK-MB less than 1, troponin less  than 0.05.  On February 10, he had labs drawn.  This total bilirubin was  1.2, alk phos 68, AST 25, ALT 27, total protein 6.2, albumin 3.9.  BNP  was 232.  Total cholesterol 95, triglycerides 56, HDL 39, LDL 45.   ASSESSMENT/PLAN:  This is a 70 year old male with recent anterior  myocardial infarction, status post drug-eluting stent with mild chest  pain and lightheadedness.  EKG unchanged.  Negative enzymes x1.  Will  admit to rule out myocardial infarction with observation and monitor for  orthostasis.  Monitor with telemetry.  With his symptoms developing upon  rising from the chair, it is very possible that this is orthostasis,  perhaps based on his blood pressure medications.  He says that his blood  pressure is generally in the low 100s now.  It is possible that at home  when he stands up, it drops so we will rule out orthostasis.  Will  continue with his home medications.  No need to provide additional  anticoagulation at this time and will repeat EKG and cycle cardiac  enzymes and follow up  accordingly.  Valetta Close, M.D.  Electronically Signed      Pricilla Riffle, MD, [facility]  Electronically Signed   JC/MEDQ  D:  06/28/2007  T:  06/29/2007  Job:  [id number]

## 2010-09-26 NOTE — Assessment & Plan Note (Signed)
[name] returns today.  He last saw me April 15, 2007 at acupuncture  treatment.  He complained of some insomnia at that time, and we trialed  him on some trazodone.  This resulted in worsening insomnia and some  stiffness around his neck, but no breathing problems.  He did not take  it anymore after that.  He has had tiredness, overall fatigue.   In the interval time he has also undergone hip x-rays which have  demonstrated only mild degenerative changes in bilateral hips, but  fairly advanced facet arthropathy in the L4-L5, L5-S1 level.  The more  cranial levels are not visualized on the exam.  His pain is worse since  I last saw him, 6 to 7 out of 10, increased with walking, bending,  sitting, standing.  He has tried to take it easy in terms of his usual  busy activities, doing some woodworking, etc.   He describes the pain as constant, tingling, aching and dull.   His review of systems positive for numbness and tingling, left lower  extremity as well as right proximal posterior thigh, spasm in his back  as well as mid back and anxiety.   IN GENERAL:  No acute distress.  Mood and affect appropriate.  His gait shows no evidence of toe drag or knee instability.  He has good  lower extremity strength, good hip range of motion.  He has mild pain to  palpation, lumbar paraspinals, no pain in the PSIS area.  Sensation is  normal and deep tendon reflexes are 1+ bilateral knees and ankles.   IMPRESSION:  Lumbar pain and hip pain.  I believe the hip pain is really  more radiating pain from the facet joints.  In addition, he appears to  have increasing radicular symptoms, and given the amount of facet  arthropathy, I would not be surprised if he has some foraminal stenosis,  but would need an MRI to further evaluate.   Will set up for an MRI lumbar spine without contrast.  I will see him  back in 1 week to followup.  If some correlating signs are seen, may  consider transforaminal  epidural steroid injection.      [name], M.D.  Electronically Signed     AEK/MedQ  D:  04/22/2007 13:03:23  T:  04/22/2007 14:59:33  Job #:  [id number]

## 2010-09-26 NOTE — Assessment & Plan Note (Signed)
[facility]                            CARDIOLOGY OFFICE NOTE   Johnathan, Graham                          MRN:          [id number]  DATE:12/02/2007                            DOB:          05-Jun-1940    PRIMARY CARE PHYSICIAN:  Gordy Savers, MD   CLINICAL HISTORY:  [name] is 70 year old returns for management of his  coronary heart disease following a recent anterior infarction.  He was  hospitalized in January with anterior infarction and underwent stenting  of the LAD with a PROMUS stent.  He had a residual of 50% narrowing in  the LAD and circumflex arteries.  His initial ejection fraction was 25%,  was improved to 40-45% on followup echocardiography.   He since has been doing well.  He has had no recent chest pain,  shortness of breath, or palpitations.  He and his wife spend about half  of the time up in [place] area and he has seen Dr. Enid Skeens with  [facility].   PAST MEDICAL HISTORY:  Significant for hyperlipidemia, chronic  thrombocytopenia, and questionable obstructive sleep apnea.   CURRENT MEDICATIONS:  Aspirin, Plavix, Cozaar, carvedilol, Allegra, and  Lipitor.   PHYSICAL EXAMINATION:  VITAL SIGNS:  The blood pressure was 111/74 and  pulse 50 and regular.  NECK:  There was no venous distention.  The carotid pulses were full  without bruits.  CHEST:  Clear.  HEART:  Rhythm was regular.  No murmurs or gallops.  ABDOMEN:  Soft.  Normal bowel sounds.  EXTREMITIES:  Peripheral pulses were full with no peripheral edema.   Electrocardiogram showed a recent anterior wall infarction.   IMPRESSION:  1. Status post anterior wall myocardial infarction on May 29, 2007, treated with a PROMUS stent to the left anterior descending.  2. Ejection fraction 25% improved to 40-45% by echocardiography.  3. Hyperlipidemia.  4. Question obstructive sleep apnea.  5. History of thrombocytopenia.   RECOMMENDATIONS:  I  think [name] is doing quite well.  His echo was  done fairly early after his infarct, so I think a repeat would be  helpful and we will arrange for him to have that done up in [place] by  Dr. Enid Skeens.  We will plan to get a lipid, liver, CBC, and BMP here and  will  forward the results back to [facility].  I told him that his activity  level can be pretty normal with the exception of avoiding a very  strenuous lifting in extremes of temperature.     Bruce Elvera Lennox Juanda Chance, MD, [facility]  Electronically Signed    BRB/MedQ  DD: 12/02/2007  DT: 12/03/2007  Job #: [id number]

## 2010-09-26 NOTE — H&P (Signed)
NAMEKYLE, Graham                   ACCOUNT NO.:  [id number]   MEDICAL RECORD NO.:  [id number]          PATIENT TYPE:  OIB   LOCATION:  2854                         FACILITY:  [facility]   PHYSICIAN:  Bettey Mare. Lawrence, NPDATE OF BIRTH:  Sep 21, 1940   DATE OF ADMISSION:  05/29/2007  DATE OF DISCHARGE:                              HISTORY & PHYSICAL   PRIMARY CARDIOLOGIST:  [name], MD, [facility]   PRIMARY CARE PHYSICIAN:  Gordy Savers, M.D.   HISTORY OF PRESENT ILLNESS:  This is a 70 year old Caucasian male with  no prior cardiac history or diabetes or hypertension who got up this  morning about 5:00 a.m. to stoke his wood fire, went back to bed around  6:30 a.m.  He began to have substernal chest pain, shortness of breath  with associated nausea.  Wife called EMS.  He was given 4 nitroglycerin  sublingual, 4 baby aspirin, and 10 mg of morphine.  EKG revealed  anterior MI, ST elevation in leads V2, 3, 4, and 5 with reciprocal  changes noted in lead 3.  The patient was brought emergently to [facility] and taken to cardiac catheterization lab for immediate  catheterization and possible intervention.  The patient apparently has  no prior history of diabetes, renal disease or blood pressure  difficulties.  The patient has not taken any Viagra or similar compounds  prior to this.   CURRENT ALLERGIES:  None.   CURRENT MEDICATIONS:  Celebrex p.r.n.   PAST MEDICAL HISTORY:  Arthritis.   SOCIAL HISTORY:  The patient lives in [place] with his wife.  He is  retired.  He does not smoke and does not drink alcohol.   CURRENT LABORATORY:  Pending.   PHYSICAL EXAMINATION:  VITAL SIGNS:  Current blood pressure 126/72,  pulse 52, respirations 20, O2 saturation 99% on 2 liters.  The patient  weighed 71.8 kg.  HEENT:  Head is normocephalic atraumatic.  Eyes PERRLA.  Mucous  membranes in the mouth are pink and moist.  Tongue midline.  NECK:  Supple, there is no JVD or  carotid bruits at present.  CARDIOVASCULAR:  Regular rate and rhythm without murmurs, rubs or  gallops.  LUNGS:  Clear to auscultation bilaterally without wheezes, rales or  rhonchi at present.  ABDOMEN:  Soft, nontender, 2+ bowel sounds, no abdominal bruits are  appreciated.  EXTREMITIES:  Without clubbing, cyanosis or edema.  SKIN:  Warm and dry.  NEURO:  Intact.   IMPRESSION:  Acute anterior ST-elevated myocardial infarction.  The  patient is taken emergently to [facility] cardiac catheterization lab  with intervention per Dr. Charlies Constable.  Based upon results with more  recommendations to follow.  The patient will be admitted to ICU and  monitored closely with addition of Plavix, beta-blockers, and ACE  inhibitor if appropriate.      Bettey Mare. Lyman Bishop, NP     KML/MEDQ  D:  05/29/2007  T:  05/29/2007  Job:  [id number]   cc:   Gordy Savers, MD

## 2010-09-26 NOTE — Procedures (Signed)
[name], [name]                   ACCOUNT NO.:  [id number]   MEDICAL RECORD NO.:  [id number]          PATIENT TYPE:  OUT   LOCATION:  SLEEP CENTER                 FACILITY:  [facility]   PHYSICIAN:  Barbaraann Share, MD,FCCPDATE OF BIRTH:  03[birth year]04/39   DATE OF STUDY:  10/22/2006                            NOCTURNAL POLYSOMNOGRAM   REFERRING PHYSICIAN:  Gordy Savers, MD   INDICATION FOR STUDY:  Hypersomnia with sleep apnea.   EPWORTH SLEEPINESS SCORE:  16.   SLEEP ARCHITECTURE:  The patient had a total sleep time of 336 minutes  with 68 minutes of REM and no slow wave sleep. Sleep onset latency was  normal and REM onset was normal as well. Sleep efficiency was decreased  at 84%.   RESPIRATORY DATA:  The patient was found to have 26 obstructive  hypopneas and 3 obstructive apneas for an apnea hypopnea index of 7  events per hour. The events were not positional, but there was mild  snoring noted throughout.   OXYGEN DATA:  The patient had O2 desaturation as low as 86% with his  obstructive events.   CARDIAC DATA:  No clinically significant cardiac arrhythmias were noted.   MOVEMENT-PARASOMNIA:  The patient was found to have 281 leg jerks with  at least 2 per hour resulting in arousal or awakening.   IMPRESSIONS-RECOMMENDATIONS:  1. Very mild obstructive sleep apnea/hypopnea syndrome with an apnea      hypopnea index of 7 events per hour and O2 desaturation as low as      86%. Treatment for this degree of sleep apnea can include weight      loss alone if applicable, upper airway surgery, oral appliance, and      also CPAP.  2. Very large numbers of leg jerks with what appears to be significant      sleep disruption. This may actually be more of an issue to his      sleep disruption than his very mild sleep apnea. Clinical      correlation is suggested in order to consider the possibility of a      primary movement disorder of sleep such a restless leg syndrome or      the      periodic leg movement syndrome. The patient may deserve a trial of      a dopamine agonist to see if this will significantly improve his      sleep.      Barbaraann Share, MD,FCCP  Diplomate, American Board of Sleep  Medicine  Electronically Signed     KMC/MEDQ  D:  11/06/2006 17:30:06  T:  11/07/2006 71:69:67  Job:  [id number]

## 2010-09-26 NOTE — Group Therapy Note (Signed)
REASON FOR EVALUATION:  Chronic low back pain, spinal stenosis,  evaluation for acupuncture as well as treatment or modalities.  The  patient is self-referred.  His wife is a patient of mine who gets  acupuncture treatment.   HISTORY:  [name] is a 70 year old male who has a history of arthritis,  which has been mainly right hip but also in the low back area.  He had a  onset of bilateral lower left greater than right lower extremity pain  Oct 08, 2004 and was seen in [place], where they have a summer home,  by an orthopedic surgeon, diagnosing herniated disk pressing on a spinal  nerve and resulting from spinal stenosis.  He had, it sounds like, 2  epidurals.  I do not have the records from that time but the lower  extremity pain was relieved.  His pain is rated about 5/10 level,  interferes with activity at 5-6/10 level.  Sleep is poor but this is  multi-factorial.  The pain is worse with walking, standing.  He gets  relative relief with sitting and squatting.  His relief from meds is  fair, really just taking Celebrex and Glucosamine.  His back is  described as constant tingling, aching and some numbness in the left  thigh.  He can walk 60 minutes at a time but this causes pain.  He does  go to the [facility] to do exercises.  He was last employed August 11, 2001.  He is retired.  He had numbness in his review of systems.  Also positive  for anxiety, takes medications for this.   MEDICATIONS:  1. Celebrex 200 mg daily.  2. Prevacid 30 mg daily.  3. Fexofenadine 180 mg.  4,  Zolpidem 10 mg q. h.s.  1. Alprazolam 0.5 b.i.d.  2. Nasonex 50 mcg b.i.d.  3. Acular 0.5 drops 4 times a day.  4. Glucosamine 750 b.i.d.  5. Saw palmetto 450 b.i.d.   ALLERGIES:  No known drug allergies.   PAST SURGICAL HISTORY:  1. Sep 16, 2006, polypectomy.  2. He has had radial keratotomy in 1995.  3. He had lens implants November 21, 2000 and March 18, 2003.  4. He has had a fatty tumor resection in the  1970s in his back.  5. He has had vasectomy at age 77.   PAST MEDICAL HISTORY:  1. GERD.  2. Kidney stones.  3. Arthritis.   SOCIAL HISTORY:  Married, lives with his wife.  Both retired.  Does not  smoke or drink.   FAMILY HISTORY:  Heart disease, diabetes, high blood pressure and  alcohol abuse in his father.   I reviewed his MRI of the lumbar spine, which was from 2006 in [place].  At L3-L4 he had a mild to moderate disk bulge, facet arthropathy  resulting in mild spinal stenosis.  He had similar findings at L4-5, L5-  S1, more significant was the facet arthropathy noted at L3-4, L4-5, L5-  S1.   VITAL SIGNS:  His blood pressure is 148/81, pulse 73, respiratory rate  18, O2 sat 97% on room air.  GENERAL:  No acute distress.  Mood and affect appropriate.  His gait is  normal.  He has good forward flexion but essentially no extension of his  lumbar spine.  His motor strength is full, bilateral deltoid, biceps,  triceps, grip as well as hip flexor, knee extension, range of motion,  great toe extensors. Sensation is normal in bilateral upper  and lower  extremities.  His deep tendon reflexes are normal.  Bilateral biceps,  triceps, brachial radialis, talar and ankle.  His internal and external  rotation of the hip is mildly reduced on the right side compared to the  left side.  Knee and ankle range of motion normal.  Upper extremity  range of motion normal.  Neck range of motion is good.   IMPRESSION:  1. Lumbar spinal stenosis.  Has some intermittent radicular symptoms      but more significantly, he has facet arthropathy, which is causing      more  axial back pain.  2. Probable right hip osteoarthritis.   RECOMMENDATIONS:  1. Medication-wise, I think he is doing quite well with what he is on.      One concern would be the Celebrex over a long period of time, given      his age but, at this point, I would not try to stop this or reduce      it unless we get some  additional pain relief from other treatment      or modalities.  2. We did discuss other treatment modalities including acupuncture and      facet medial branch blocks.  He would like to proceed with the      acupuncture at this time and if he did not respond, would go with      the facet medial branch blocks.  3. Should he have a recurrence of radicular symptoms that are more      constant and severe, would recommend epidural steroid injection.   ADDENDUM:  Acupuncture today consists of BL 21, BL 23, BL 25, BL 27 and  bilateral GB 30 4hz  times 30 minutes.  The patient tolerated the  procedure well.  Will see him back next week for repeat treatment.      [name], M.D.  Electronically Signed     AEK/MedQ  D:  03/18/2007 15:55:18  T:  03/19/2007 11:49:19  Job #:  [id number]   cc:   Gordy Savers, MD  [address] [place]  [place] [address]

## 2010-09-26 NOTE — Procedures (Signed)
NAMEJARMAL, [name]                   ACCOUNT NO.:  [id number]   MEDICAL RECORD NO.:  [id number]          PATIENT TYPE:  REC   LOCATION:  TPC                          FACILITY:  [facility]   PHYSICIAN:  Erick Colace, M.D.DATE OF BIRTH:  [birth year]   DATE OF PROCEDURE:  DATE OF DISCHARGE:                               OPERATIVE REPORT   ACUPUNCTURE TREATMENT #6:  Treatment today consisted of bilateral BL23,  BL52, DU4, as well as BL37 and BL40 bilaterally.   4 Hz Stim x30 minutes.  The patient tolerated the procedure well.      Erick Colace, M.D.  Electronically Signed     AEK/MEDQ  D:  04/22/2007 13:00:18  T:  04/22/2007 15:32:09  Job:  [id number]

## 2010-09-26 NOTE — Discharge Summary (Signed)
NAMEJONNIE, [name]                   ACCOUNT NO.:  [id number]   MEDICAL RECORD NO.:  [id number]          PATIENT TYPE:  REC   LOCATION:  REHS                         FACILITY:  [facility]   PHYSICIAN:  Jonelle Sidle, MD DATE OF BIRTH:  [birth year]   DATE OF ADMISSION:  06/19/2007  DATE OF DISCHARGE:  06/29/2007                               DISCHARGE SUMMARY   PRIMARY CARDIOLOGIST:  [name], MD, [facility].   PRIMARY CARE PHYSICIAN:  Gordy Savers, M.D.   PROCEDURE PERFORMED DURING HOSPITALIZATION:  None.   FINAL DISCHARGE DIAGNOSES:  1. Atypical chest pain in patient with known coronary artery disease.      a.     Status post anterior myocardial infarction with reperfusion       and drug-eluting stent to the proximal left anterior descending       artery with stenosis of 100% to 0% with improvement of TIMI III       flow.  2. Thrombocytopenia with platelet level from 95-117 chronically with      prior HIT profile negative.  3. Hypotension.   HISTORY OF PRESENT ILLNESS:  This 70 year old Caucasian male with past  medical history as discussed above, who had complaints of chest  tightness and tingling in both hands and feet which elicited the patient  to return to the ER with known history of CAD and stent to the LAD.  The  patient was seen and examined by Dr. Dietrich Pates and Valetta Close,  resident, and admitted to rule out myocardial infarction and evaluate  for orthostatic hypotension, as the patient was admitted with  hypotension as well.  Initial blood pressure was found to be 105/64.   The patient's cardiac enzymes were cycled.  The patient was started on a  nitroglycerin drip initially, but this was discontinued secondary to  hypotension.  The patient was not started on any heparin as initial  troponins were found to be negative.  Follow-up troponins were found to  be negative as well with a troponin of 0.04 and less than 0.05.  The  patient had no active bleeding  or weakness associated.   The patient did have an echocardiogram which revealed an EF of 40-45%  with distal anterior wall apical septal akinesis, hyperdynamic basal  function, with hypokinesis of the anterior septal wall in January, 2009.   The following day, the patient had no further complaints of chest pain.  It was still found that he was mildly hypotensive on Coreg dose, and  this was decreased from 6.125 to 3.125.  The patient has been seen and  examined by myself and Dr. Simona Huh and will return home today.  He  will skip cardiac rehab tomorrow and follow up with Dr. Charlies Constable in  1-2 weeks.   DISCHARGE LABS:  Sodium 142, potassium 3.7, chloride 111, CO2 27, BUN 9,  creatinine 0.8, glucose 73.  Hemoglobin 14.1, hematocrit 41.3, white  blood cells 5, platelets 95, down from 117 on admission.   VITAL SIGNS:  Blood pressure 100/66, heart rate 57, respirations 18,  O2  sat 99% on room air.   DISCHARGE MEDICATIONS:  1. Coreg 3.125 mg b.i.d.  2. Aspirin 325 daily.  3. Plavix 75 mg daily.  4. Lipitor 80 mg daily.  5. Cozaar 50 mg daily.  6. Allegra 180 mg daily.  7. Saw palmetto 450 mg in the morning and cranberry 500 mg at night.   ALLERGIES:  No known drug allergies.   FOLLOW-UP PLANS/APPOINTMENTS:  1. Patient will follow up with his primary care physician within the      next couple of weeks for continued medical management.  2. Patient will follow up with Dr. Charlies Constable or a physician's      assistant in 1-2 weeks for continued management and evaluation of      blood pressure with lower dose of Coreg.  3. The patient is advised to skip cardiac rehab tomorrow and allow      medication to be changed with followup on Wednesday for cardiac      rehab and evaluation of blood pressure.   Time spent with the patient to include physician time, 35 minutes.      Bettey Mare. Lyman Bishop, NP      Jonelle Sidle, MD  Electronically Signed    KML/MEDQ  D:   06/29/2007  T:  06/30/2007  Job:  [id number]

## 2010-09-26 NOTE — Assessment & Plan Note (Signed)
This is a follow-up appointment.  Acupuncture treatment has been done  today. In addition he has had some exacerbation of back pain as well as  hip pain and increasing radicular symptoms.  Last visit on 04/22/07.  He  had been limiting his activities of daily living and in fact limiting  any kind of yard work he has been doing.  Because of increased radicular  symptoms despite adjusting medications and doing acupuncture, ordered an  MRI which was performed without contrast on 04/25/07. This demonstrated  facet arthropathy L4/5 with degenerative changes resulting in 5 mm of  anterolisthesis L4 on L5.  This combined with mild bulging in the disk  results in bilateral lateral recessed narrowing possibly compressing L5  nerve root at L5/S1 bilateral facet arthropathy was noted as well.  His  average pain is 3-4/10 and in fact his pain has subsided somewhat since  resuming his Celebrex.  His pain interferes with activity at a moderate  level.  His pain is worse with walking, bending, sitting standing and  some other activities but were improving with rest, heat, exercise and  pacing his activities as well as taking medication particularly his  Celebrex.  His walking tolerance is 60 minutes.  He has numbness and  tingling left leg but this is not particularly painful at the current  time.  He has anxiety but no depression.   PHYSICAL EXAMINATION:  GENERAL: No acute distress.  Mood and affect  appropriate.  Back has tenderness to palpation.  Lumbosacral junction  bilaterally going up to L4.  His hip, knee and ankle range of motion are  normal. He has negative straight leg raising sign bilaterally, mildly  decreased sensation in the lateral border of the left foot as well as  lateral border of the leg.  He has no evidence of knee effusion.  His  deep tendon reflexes are normal at the knees and 1+ at the ankles.  His  gait shows no evidence of toe drag or knee instability.   IMPRESSION:  1.  Lumbar spinal stenosis with intermittent radicular symptoms.  2. Lumbar facet arthropathy.   PLAN:  Will continue acupuncture treatments.  I will see him back in one  week, consider ESI if radicular symptoms predominate.      Erick Colace, M.D.  Electronically Signed     AEK/MedQ  D:  04/29/2007 15:36:45  T:  04/30/2007 08:32:52  Job #:  [id number]

## 2010-09-26 NOTE — Procedures (Signed)
[name], [name]                   ACCOUNT NO.:  [id number]   MEDICAL RECORD NO.:  [id number]          PATIENT TYPE:  OUT   LOCATION:  XRAY                         FACILITY:  [facility]   PHYSICIAN:  Erick Colace, M.D.DATE OF BIRTH:  January 12, 1941   DATE OF PROCEDURE:  DATE OF DISCHARGE:  04/25/2007                               OPERATIVE REPORT   PROCEDURE:  Acupuncture treatment number seven.   TREATMENT TECHNIQUE:  Bilateral BL 21, BL 23, BL 25, BL 27, BL 37 and GB  30 bilaterally. 4 Hertz stem x 30 minutes. The patient tolerated the  procedure well and will return in one week.      Erick Colace, M.D.  Electronically Signed     AEK/MEDQ  D:  04/29/2007 15:32:47  T:  04/30/2007 09:49:45  Job:  [id number]

## 2010-09-26 NOTE — Assessment & Plan Note (Signed)
[facility]                            CARDIOLOGY OFFICE NOTE   STEVON, GOUGH                          MRN:          [id number]  DATE:05/18/2008                            DOB:          Oct 09, 1940    PRIMARY CARE PHYSICIAN:  Gordy Savers, MD   CLINICAL HISTORY:  Mr. Sheppard is a 70 years old who returned for followup  management of coronary heart disease.  He had an anterior wall  myocardial infarction in January 2009 and underwent stenting of the LAD  with a Promus stent.  He had residual 50% narrowing in the LAD and  circumflex artery.  The ejection fraction was initially 25%, but he had  an echocardiogram in July in [place] where they live in the summer  which was 45-50%.   He said he has been doing quite well.  He has had no chest pain,  shortness breath, or palpitations.  He and his wife will normally  workout at the [facility] doing both weights and treadmill, although has  not done this recently due to the holidays.   PAST MEDICAL HISTORY:  Significant for hyperlipidemia.  He also has  chronic thrombocytopenia and was recently seen in consultation by Dr.  Cyndie Chime and was told he also had Factor VII deficiency, which  predisposes him to bleeding.  I do not have those records yet.  Apparently, his daughter has factor VII deficiency and is prone to  bleeding.   CURRENT MEDICATIONS:  1. Aspirin.  2. Cozaar 50 mg daily.  3. Allegra.  4. Carvedilol 3.125 mg b.i.d.  5. Lipitor 80 mg one-half tablet nightly.  6. Plavix 75 mg daily.   PHYSICAL EXAMINATION:  VITAL SIGNS:  Today, the blood pressure is 123/73  and the pulse 53 and regular.  NECK:  There is no venous distention.  The carotid pulses were full, and  there were no bruits.  CHEST:  Clear without rales or rhonchi.  HEART:  Rhythm was regular.  There were no murmurs or gallops.  ABDOMEN:  Soft with normal bowel sounds.  EXTREMITIES:  Peripheral pulses full with no  peripheral edema.   Electrocardiogram showed an old anterior wall infarction and sinus  bradycardia.   IMPRESSION:  1. Coronary artery disease, status post anterior wall myocardial      infarction in January 2009, treated with a Promus sent to the left      anterior descending.  2. Ejection fraction 25% improved to 45-50% by last echocardiogram in      July 2009.  3. Hyperlipidemia.  4. History of thrombocytopenia and bleeding disorder evaluated by Dr.      Cyndie Chime.   RECOMMENDATIONS:  I think, Mr. Maker is doing very well from standpoint of  his heart.  He had an episode of severe bleeding after having a skin  lesion removed.  He needs to have additional skin surgery and also eye  surgery and I think, he has out passed a year since his stents.  I think  it is okay for him to come off the  Plavix 7 days prior to both of these  surgeries.  I will need to make a decision whether to discontinue the  Plavix or to continue __________.  I would like to review Dr.  Patsy Lager note and we will make a final decision about that when I  see Mr. Ardoin back in May.  We will also do an echocardiogram prior to  this in May.  We will have him come in for fasting lab work including  lipid, liver, CBC, and BMP.      Bruce Elvera Lennox Juanda Chance, MD, [facility]  Electronically Signed    BRB/MedQ  DD: 05/18/2008  DT: 05/19/2008  Job #: [id number]

## 2010-09-26 NOTE — Procedures (Signed)
[name], [name]                   ACCOUNT NO.:  [id number]   MEDICAL RECORD NO.:  [id number]          PATIENT TYPE:  REC   LOCATION:  TPC                          FACILITY:  [facility]   PHYSICIAN:  Erick Colace, M.D.DATE OF BIRTH:  11-Jan-1941   DATE OF PROCEDURE:  03/25/2007  DATE OF DISCHARGE:                               OPERATIVE REPORT   Acupuncture treatment today for lumbar stenosis and lumbar facet  syndrome with back pain related to this.   The patient in prone position, needles bilaterally at EL21, BL23, BL25,  BL27, GB30, as well as right GB34, e-stem between all points except for  GB34, 2 Hz x30 minutes.  The patient tolerated the procedure well,  getting good results from previous treatments.  We will continue weekly  visits for acupuncture.      Erick Colace, M.D.  Electronically Signed     AEK/MEDQ  D:  03/25/2007 08:37:09  T:  03/25/2007 12:37:14  Job:  [id number]   cc:   Gordy Savers, MD  [address] [place]  [place] [address]

## 2010-09-26 NOTE — Procedures (Signed)
NAME:  [name], [name]                   ACCOUNT NO.:  [id number]   MEDICAL RECORD NO.:  [id number]           PATIENT TYPE:   LOCATION:                                 FACILITY:   PHYSICIAN:  Erick Colace, M.D.DATE OF BIRTH:  Oct 15, 1940   DATE OF PROCEDURE:  04/01/2007  DATE OF DISCHARGE:                               OPERATIVE REPORT   PROCEDURE:  Acupuncture treatment today consists of needles placed  bilaterally at the BL21, BL27, as well as left BL23 and BL25 and left  BL40, GB 31 and GB 34.  __________  30 minutes.   The patient tolerated the procedure well.   FOLLOWUP:  Return in one to two weeks.      Erick Colace, M.D.  Electronically Signed     AEK/MEDQ  D:  04/01/2007 11:10:04  T:  04/01/2007 17:03:36  Job:  [id number]

## 2010-09-26 NOTE — Cardiovascular Report (Signed)
[name], [name]                   ACCOUNT NO.:  [id number]   MEDICAL RECORD NO.:  [id number]          PATIENT TYPE:  INP   LOCATION:  2807                         FACILITY:  [facility]   PHYSICIAN:  [name], MD, FACCDATE OF BIRTH:  08-13-1940   DATE OF PROCEDURE:  05/29/2007  DATE OF DISCHARGE:                            CARDIAC CATHETERIZATION   PROCEDURE:  1. Cardiac catheterization.  2. Percutaneous coronary intervention.  3. Intravascular ultrasound.   CLINICAL HISTORY:  [name] is 70 years old and had no prior history of  known heart disease.  His only medication is Celebrex.  This morning at  6:30 he developed chest pain and called EMS.  He was given 4 chewable  aspirin and transported promptly to the catheterization laboratory after  a code STEMI was called for an anterior infarction.   PROCEDURE:  The procedure was performed via the right femoral artery  using an arterial sheath and 6-French preformed coronary catheters.  We  did the right coronary injection and an left ventriculogram and then  went in with a Q-4 6-French guiding catheter with side holes.  The  patient given antiemetics bolus and infusion and was given 600 mg of  Plavix and 20 mg of Pepcid.  We crossed the totally occluded lesion in  the proximal LAD with the Prowater wire without difficulty.  This did  not reestablish flow.  We dilated the lesion with a 2.5 x 20-mm Maverick  balloon and this reestablished flow.  There was not much thrombus and we  went ahead and dilated with a 2.5 balloon up to 8 atmospheres for 20  seconds.  We then deployed a 2.75 x 18-mm Promus stent, deploying this  with 1 inflation up to 14 atmospheres for 20 seconds.  This crossed the  diagonal branch.  This resulted in somewhat slow flow down the diagonal  branch, although the flow was slow at the beginning as well.  We then  postdilated with a 3 x 15-mm Quantum Maverick, performing 2 inflations  up to 16 atmospheres for 20  seconds.  We then used an Atlantis IVUS  catheter and I passed the catheter distal to the stent and did automatic  pullback; this documented good stent expansion and full apposition, but  the distal edge of the stent did not appear to transition quite as well  as we liked into the distal vessel, so we went back in with the 3 x 50-  mm Quantum Maverick, placing the balloon right at the distal edge of the  stent and performed 1 inflation up to 16 atmospheres for 20 seconds.  Final diagnostic study was then performed through the guiding catheter.  The patient tolerated the procedure well and left the laboratory in  satisfactory condition.   RESULTS:  Left main coronary artery:  The left main coronary is free of  disease.   Left anterior descending artery:  The left anterior descending artery  was completely occluded in its proximal portion before the branches.   Circumflex artery>  The circumflex artery gave rise to a ramus branch,  a  marginal branch and a posterolateral branch.  There was 50% narrowing in  the mid circumflex artery after the marginal branch.   Right coronary artery:  The right coronary artery is a moderate-sized  vessel and gave rise to a conus branch, a right ventricular branch, a  posterior descending branch and a small posterolateral branch.  There  was 30% narrowing in the proximal right coronary artery and 50%  narrowing in the midvessel.   LEFT VENTRICULOGRAM:  The left ventriculogram performed in the RAO  projection showed dyskinesis of the apex and akinesis of the  anterolateral wall.  The estimated ejection fraction was 25%.   Following stenting of the lesion in the proximal LAD, the stenosis  improved from 100% to 0% and the flow improved from TIMI-0 to TIMI-3  flow.  There was residual 90% narrowing in the ostium of the diagonal  branch with TIMI-2 flow.   The intravascular ultrasound showed the minimal stent diameter was 3 mm  with the distal reference  of about 3.25 mm.  There was some protrusion  of plaque or thrombus within the stent.   The aortic pressure was 125/77 with a mean of 97.  Left ventricular  pressure was 125/24.   The patient had the onset of chest pain at 6:30 and he arrived in the  cath lab via [facility] a 8:12.  The first balloon inflation  was 8:42.  This gave a door-to-balloon time of 30 minutes and  reperfusion time of 2 hours 12 minutes.   IMPRESSION:  1. Acute anterior wall myocardial infarction with total occlusion of      proximal left anterior descending, 50% narrowing in the mid      circumflex artery, 30% proximal and 50% mid stenosis in the right      coronary and apical wall dyskinesis and anterolateral wall akinesis      with an estimated ejection fraction of 25%.  2. Successful reperfusion and stenting of the lesion in the proximal      left anterior descending with improvement in the percentage in the      area of narrowing from 100% to 0% improvement in flow from TIMI-0      to TIMI-3 flow using a Promus drug-eluting stent.   DISPOSITION:  The patient was returned to the post-angioplasty unit for  further observation.  We will plan to treat the patient with Integrilin  because of possible intrastent thrombosis, although this could be plaque  protrusion.  The dyskinetic apex will suggest that recovery of his LV  function may be suboptimal despite the early perfusion time.      Johnathan Elvera Lennox Juanda Chance, MD, [facility]  Electronically Signed     BRB/MEDQ  D:  05/29/2007  T:  05/29/2007  Job:  [id number]   cc:   Gordy Savers, MD  [facility]

## 2010-09-26 NOTE — Procedures (Signed)
[name], [name]                   ACCOUNT NO.:  [id number]   MEDICAL RECORD NO.:  [id number]          PATIENT TYPE:  OUT   LOCATION:  XRAY                         FACILITY:  [facility]   PHYSICIAN:  Erick Colace, M.D.DATE OF BIRTH:  [birth year]   DATE OF PROCEDURE:  DATE OF DISCHARGE:  04/25/2007                               OPERATIVE REPORT   Acupuncture treatment #8.   Treatment today consisted of bilateral BL-23, bilateral BL-21, BL-25, BL-  27, GB-30, and GB-25.  E-Stim 4 Hz x30 minutes.   Patient tolerated the procedure well.  Repeat in two weeks.  Pain levels  down to 3-4/10.      Erick Colace, M.D.  Electronically Signed     AEK/MEDQ  D:  05/06/2007 14:07:50  T:  05/06/2007 20:37:48  Job:  [id number]

## 2010-09-26 NOTE — Assessment & Plan Note (Signed)
[facility]                            CARDIOLOGY OFFICE NOTE   [name], [name]                          MRN:          [id number]  DATE:07/22/2007                            DOB:          04/11/1941    PRIMARY CARE PHYSICIAN:  Dr. Eleonore Chiquito.   PAST MEDICAL HISTORY:  [name] is 70 years old and returns for followup  management of his coronary heart disease on recent anterior wall  infarction.  He was hospitalized on January 15 with anterior wall  infarction and underwent stenting of the LAD with a PROMUS stent.  Ejection fraction was initially 25% but on followup echocardiogram this  improved to 40-45%.  He was reperfused at 2 hours and 12 minutes.  He  was readmitted a couple weeks ago with chest pain and kept overnight.  The pain was thought not to be cardiac.  His medicines were a little  strong and his Coreg was cut back.  He done well since that time and has  been participating in the rehab program without any problems.   PAST MEDICAL HISTORY:  Significant for hyperlipidemia and questionable  obstructive sleep apnea.  He also has a history of chronic  thrombocytopenia with a negative hip profile.   CURRENT MEDICATIONS:  1. Include aspirin.  2. Plavix.  3. Lipitor.  4. Cozaar.  5. Allegra.  6. Carvedilol 3.125 b.i.d.   The Cozaar is 50 mg daily and Lipitor was 80 mg daily.   EXAMINATION:  Blood pressure was 120/80 the pulse 64 and regular.  There is no venous distension.  The carotid pulses were full without  bruits.  CHEST:  Was clear.  HEART:  Rhythm is regular.  No murmurs or gallops.  ABDOMEN:  Soft, normal bowel sounds.  No hepatosplenomegaly.  Peripheral pulses full.  No peripheral edema.   IMPRESSION:  1. Recent anterior wall myocardial portion January 15 treated with a      PROMUS drug-eluting stent to LAD, now stable.  2. Ejection fraction 25% improved to 40-45% by echocardiography.  3. Hyperlipidemia.  4.  Questionable obstructive sleep apnea.  5. History of thrombocytopenia with platelet count from 95,000 to      117,000 with a negative profile.  6. History of elevated bilirubin.   RECOMMENDATIONS:  [name] is doing quite well.  His HDL is 38.  His LDL  is 45 and the high dose Lipitor may be reducing his HDL.  Will plan to  cut this back to 40 a day.  Will recheck a lipid and liver in a month  including a bilirubin.  I will see him back in follow-up in about 3  months.  I told he could he could pursue pretty much normal activities.  I told he could go to the summer home in the next couple of months.     Bruce Elvera Lennox Juanda Chance, MD, [facility]  Electronically Signed    BRB/MedQ  DD: 07/22/2007  DT: 07/23/2007  Job #: [id number]

## 2010-09-26 NOTE — Assessment & Plan Note (Signed)
[name]                            CARDIOLOGY OFFICE NOTE   [name], [name]                          MRN:          [id number]  DATE:07/09/2007                            DOB:          01-09-1941    REFERRING PHYSICIAN:  Everardo Beals. [name], [name], [name]   PRIMARY CARE PHYSICIAN:  [name], [name].   CLINICAL HISTORY:  This is a 70 year old married white male patient who  had an anterior wall MI treated with drug-eluting stent to the proximal  LAD  on May 29, 2007.  He also has a history of thrombocytopenia  with platelet levels from 95-117 chronically with prior HIT profile  negative.  He was readmitted to the [facility] with atypical chest pain.  Echocardiogram did reveal an ejection fraction of 40-45% with distal  anterior wall apical septal hypokinesis with a hyperdynamic basal  function and hypokinesis of the anterior septal wall. The patient was  mildly hypotensive and had some slow heart rates on Coreg and this was  decreased from 6.125 to 3.125. The patient returns today for post  [facility]raham follow-up.  Overall his cardiac status is stable.  He has had  two occasions where he felt his heart skipping. By the time he tried to  take his pulse, it was normal again. His main complaint is he feels like  he is getting and upper respiratory infection with a dry throat and some  drainage and hoarseness.  He wears a face mask whenever he goes anywhere  and forgot it when he was talking with his neighbor who ended up in the  [facility] with pneumonia, so he is concerned this may happen to him.  Overall he is doing well and he is ready to restart cardiac rehab when  his cold improves and needs a note for this which I have given him.   CURRENT MEDICATIONS:  1. Aspirin 325 mg daily.  2. Plavix 75 mg daily.  3. Saw palmetto 450 mg daily.  4. Lipitor 80 mg q.h.s.  5. Cozaar 50 mg daily.  6. Allegra 180 mg daily.  7. Carvedilol 3.125 b.i.d.  8.  Cranberry 500 daily.   PHYSICAL EXAM:  This is a pleasant but anxious 70 year old white male in  no acute distress.  Blood pressure 123/78, pulse 58, weight 150.  NECK:  Without JVD, HJR, bruit or thyroid enlargement.  LUNGS:  Clear anterior, posterior and lateral.  HEART:  Regular rate and rhythm at 58 beats per minute, normal S1 and  S2. Positive S4. No murmur, rub, bruit, thrill, or heave noted.  ABDOMEN:  Soft without organomegaly, masses, lesions or abnormal  tenderness.  EXTREMITIES:  Without cyanosis, clubbing or edema. He has good distal  pulses.   IMPRESSION:  1. Coronary artery disease status post anterior wall myocardial      infarction treated with drug-eluting stent to the proximal left      anterior descending on May 29, 2007 doing well.  2. Hypotension and bradycardia improved on lower dose of carvedilol.  3. Thrombocytopenia with platelet level from 95-117 chronically  with      prior HIT profile negative.  4. Left ventricular dysfunction ejection fraction 40-45% on 2-D echo.  5. Upper respiratory infection.   PLAN:  The patient is stable from a cardiac standpoint.  His lungs are  completely clear and I do not see any need for antibiotic at this time.  I told him if his respiratory infection progresses to call Dr.  Amador Graham concerning treatment of this. He can resume cardiac rehab  when he feels better and he will see Dr. Juanda Graham back March 10.      [name], [name]  Electronically Signed      [name], [name]  Electronically Signed   ML/MedQ  DD: 07/09/2007  DT: 07/10/2007  Job #: [id number]   cc:   [facility]

## 2010-09-26 NOTE — Discharge Summary (Signed)
NAME:  [name], [name]                   ACCOUNT NO.:  [id number]   MEDICAL RECORD NO.:  [id number]          PATIENT TYPE:  INP   LOCATION:  3739                         FACILITY:  [facility]   PHYSICIAN:  Bruce R. Juanda Chance, MD, FACCDATE OF BIRTH:  01-26-1941   DATE OF ADMISSION:  05/29/2007  DATE OF DISCHARGE:  06/02/2007                               DISCHARGE SUMMARY   PRIMARY CARDIOLOGIST:  [name], MD, [facility].   PRIMARY CARE PHYSICIAN:  Gordy Savers, MD.   PROCEDURES PERFORMED:  1. Emergent cardiac catheterization per Dr. Juanda Chance on May 29, 2007.      a.     Acute anterior wall myocardial infarction with total       occlusion of proximal left anterior descending, 50% narrowing in       the mid circumflex artery, 30% proximal and 50% mid stenosis in       the right coronary artery and apical wall dyskinesis and anterior       wall akinesis with estimated ejection fraction of 25%.  2. Successful reperfusion and stenting of the lesion in the proximal      LAD with improvement to percentage in the area of narrowing from      100% to 0%, improving in flow from TIMI-0 to TIMI-3 flow using      Promus drug eluting stent.   FINAL DISCHARGE DIAGNOSES:  1. Acute anterior wall myocardial infarction.  2. Systolic dysfunction with ejection fraction 40-45%.  3. Hyperlipidemia.   HOSPITAL COURSE:  This is a very pleasant 70 year old Caucasian male who  was admitted emergently to cardiac catheterization lab in the setting of  acute anterior ST elevation myocardial infarction.  The patient was at  home, on the early morning hours of admission he was stoking a wood  fire, became short of breath and having substernal chest pain  approximately one hour after doing so.  Wife called EMS and he was found  to have ST elevation in leads V2, 3, 4, 5 with reciprocal changes noted  in lead III.  The patient was given nitroglycerin, aspirin by EMS and  brought emergently to catheterization  lab.   The patient underwent emergent cardiac catheterization per Dr. Juanda Chance.  Please see Dr. Regino Schultze thorough cardiac catheterization report for more  details.  It was indicated that the patient had a total occlusion of the  proximal left anterior descending artery and had PTCA and stenting of  the LAD reducing the narrowing from 100% to 0%.  The patient was also  found to have some LV dysfunction with EF of 25%.   The patient recovered well without further complaints of chest  discomfort.  The patient was continued on Integrilin for 24 hours post  procedure and careful evaluation of his right groin site was done per  nursing in ICU.  The patient was started on Coreg and lisinopril 5 mg  along with Plavix p.o.   The patient did well.  Cardiac rehab worked with him concerning  instructions for nutrition and exercise and medication  along with need  to call 911 with recurrence of chest discomfort if necessary.   The patient was followed closely over the weekend by Dr. Simona Huh  and monitored for any difficulties or recurrence of chest discomfort  which was not seen.  The patient was found to have an apical thrombosis  per echocardiogram  and EF was found to be 40-45% at that time.   The patient's labs were also evaluated very closely and he was known to  have some mild thrombocytopenia with platelets dropping in the 115 range  down from 132.  The patient's lab work was monitored and HIT profile was  drawn, although results are pending at this time.  The patient was on no  further anticoagulation with the exception of Plavix on discharge.   On the day of discharge, the patient was seen and examined by Dr. Charlies Constable and found to be stable.  The patient had no further complaints of  chest pain.  He had medications discussed with him along with nutrition  consult as well.   DISCHARGE LABORATORY DATA:  Sodium 139, potassium 4, chloride 104, CO2  229, glucose 102, BUN 15,  creatinine 1.08.  Hemoglobin 14.5, hematocrit  42.5, wbc 8, platelets 117.  TSH 1.065.  Troponin on initial  presentation was greater than 100 with CK of 4047, MB of 499.3.  Subsequent troponin remained elevated with no follow-up troponin after  second set completed.   EKG dated May 31, 2007, revealing anterior infarction with normal  sinus rhythm, lateral T-wave inversion noted.   DISCHARGE MEDICATIONS:  1. Aspirin 325 mg p.o. daily.  2. Plavix 75 mg daily.  3. Lipitor 80 mg daily.  4. Coreg 6.25 mg twice a day.  5. Lisinopril 5 mg daily.  6. Celebrex twice a day.  7. Allegra 180 p.r.n.  8. Saw palmetto 450 b.i.d.  9. Grape seed 100 mg daily.  10.Cranberry 500 mg daily.  11.Nitroglycerin 0.4 mg under tongue p.r.n. chest discomfort.   ALLERGIES:  NO KNOWN DRUG ALLERGIES.   FOLLOW UP PLANS AND APPOINTMENTS:  1. The patient scheduled to see Dr. Juanda Chance on June 12, 2007, at 11      a.m.  2. The patient is to follow up with primary care physician, Dr.      Amador Cunas, for continued medical management.  3. The patient has been given post cardiac catheterization      instructions with emphasis on the right groin site for evidence of      bleeding, hematoma or infection.  4. The patient has been spoken to through nutrition for heart healthy      diet.  5. The patient is to follow up as an outpatient cardiac rehab for      continued cardiac rehabilitation, management and evaluation.   DURATION OF DISCHARGE ENCOUNTER:  Time spent with the patient to include  physician time was 45 minutes.      Bettey Mare. Lyman Bishop, NP      [name], MD, [facility]  Electronically Signed    KML/MEDQ  D:  06/02/2007  T:  06/02/2007  Job:  [id number]   cc:   Gordy Savers, MD

## 2010-09-26 NOTE — Procedures (Signed)
[name], [name]                   ACCOUNT NO.:  [id number]   MEDICAL RECORD NO.:  [id number]          PATIENT TYPE:  REC   LOCATION:  TPC                          FACILITY:  [facility]   PHYSICIAN:  Erick Colace, M.D.DATE OF BIRTH:  08/15/1940   DATE OF PROCEDURE:  04/08/2007  DATE OF DISCHARGE:                               OPERATIVE REPORT   [name] returns today.  He has a history lumbar spinal stenosis.  Acupuncture treatment #4 is scheduled for today.   PROCEDURE:  Treatment consisted of bilateral needles at BL21, BL23,  BL25, BL27 and CB30, 4 Hz stimulation x30 minutes.  The patient  tolerated the procedure well.  Return in 1 week.      Erick Colace, M.D.  Electronically Signed     AEK/MEDQ  D:  04/08/2007 12:09:44  T:  04/08/2007 13:34:04  Job:  [id number]

## 2010-09-26 NOTE — Assessment & Plan Note (Signed)
[facility]                            CARDIOLOGY OFFICE NOTE   [name], [name]                          MRN:          [id number]  DATE:06/12/2007                            DOB:          Jul 23, 1940    PRIMARY CARE PHYSICIAN:  Dr. Eleonore Chiquito.   CLINICAL HISTORY:  [name] is 71 years old and was admitted to [facility] on January 15 with an anterior wall myocardial infarction.  He  was brought in by ambulance and was reperfused at 2 hours and 12 minutes  after the onset of pain.  A PROMUS stent was placed to the LAD.  His  initial ejection fraction of at 25% but an echocardiogram on day two  which showed an ejection fraction of 40-45%.   He has done quite well since discharge.  Has had some sharp fleeting  chest pain but nothing that sounds like angina.  So far, he has not  driven and he only did some light walking.   His past medical history significant for a questionable history of sleep  apnea.  Has a history of hyperlipidemia.   His current medications include aspirin, carvedilol, Plavix, Lipitor,  lisinopril.  He has had some dry hacking cough, which they thought might  be related to lisinopril.   EXAMINATION:  Today the blood pressure is 111/68 and pulse 57 and  regular.  There is no venous distension.  The carotid pulses were full without  bruits.  CHEST:  Was clear without rales or rhonchi.  HEART:  Rhythm is regular.  No murmurs or gallops.  ABDOMEN:  Soft.  Normal bowel sounds.  The right femoral artery site was well-healed.  There was a small knot.  There is no peripheral edema.  Pedal pulses were equal.   Echocardiogram showed evidence of his recent anterior wall infarction.   IMPRESSION:  1. Recent anterior wall myocardial infarction May 29, 2007 treated      with drug-eluting stent to LAD.  2. Ejection fraction of 25% acutely increased to 40-45% by      echocardiography before discharge.  3. Hyperlipidemia.  4. Questionable obstructive sleep apnea.   RECOMMENDATIONS:  I think [name] is doing well.  He was reperfused  early so I hope that his recovery of function will be good.  He is  having a cough with the lisinopril so will switch him from lisinopril to  Cozaar 50 daily.  I will see him back in 4 to 5 weeks and will get an  echocardiogram prior that visit.  Also get a BNP, CBC at that time.  I  told him he could and drive  by himself locally in 1 week.  He is to be oriented with a cardiac rehab  on Monday so he will be involved in that shortly.     Johnathan Elvera Lennox Juanda Chance, MD, [facility]  Electronically Signed    BRB/MedQ  DD: 06/12/2007  DT: 06/12/2007  Job #: [id number]

## 2010-09-26 NOTE — Assessment & Plan Note (Signed)
Patient returns today.  He had his 1st acupuncture treatment done on  March 18, 2007.  He has multiple additional questions regarding not  only acupuncture, but back pain treatment.  His back pain is in the 5 to  6 out of 10 range on average, currently in the 3 to 5 range.  Interference with general activity at 5 to 6, relationship with other  people at 4, enjoyment of life 4.  Sleep is fair.  Pain improves with  rest, heat, pacing activities.  Increases with walking, bending,  sitting, standing.  His pain is dull, constant, tingling, and aching.  The tingling is mainly in the left lower extremity.  He can walk 60  minutes at a time, but this causes pain.  He can climb steps and drive.  He is retired.   REVIEW OF SYSTEMS:  Positive for numbness, tingling, spasms, anxiety,  mild sleep apnea.   EXAMINATION:  Reveals normal gait.  Normal mood and affect.  He has no significant tenderness to palpation of the lumbar paraspinals.  He has pain with extension greater than with forward flexion.  His  forward flexion is about 50%.  Range and extension about 25% range.  Lower extremity strength is normal.  No PSIS tenderness.  No hip  tenderness.  No hip range of motion problems.   IMPRESSION:  Lumbar stenosis facet syndrome.  We discussed the  following:  1. Patient had questions about whether numbness affected little toe or      big toe with the significance, and I indicated L5 versus S1      dermatome.  Patient also had questions regarding Celebrex,      regarding tolerance, and I clarified that it really is not a      tolerance issue, but more likely to have side effects the younger      you are on it, such as gastrointestinal versus kidney, and perhaps      trying a smaller dose if acupuncture helps in reducing his pain,      may be beneficial in the long run.  2. He had some questions about corticosteroids in regards to spinal      injections, and how this may affect the immune system,  cancer,      diabetes, and we talked about this in great detail.   Over 50% of the visit was spent in counseling and coordination of care.  Questions were answered.      Erick Colace, M.D.  Electronically Signed     AEK/MedQ  D:  03/25/2007 08:40:40  T:  03/25/2007 12:35:08  Job #:  [id number]

## 2010-09-26 NOTE — Procedures (Signed)
NAMEJORDANY, RUSSETT                   ACCOUNT NO.:  [id number]   MEDICAL RECORD NO.:  [id number]          PATIENT TYPE:  REC   LOCATION:  [facility]                          FACILITY:  [facility]   PHYSICIAN:  Erick Colace, M.D.DATE OF BIRTH:  [birth year]   DATE OF PROCEDURE:  DATE OF DISCHARGE:                               OPERATIVE REPORT   ACUPUNCTURE TREATMENT #5.   Treatment today consisted of needles placed bilateral BL21, BL23, BL25,  BL27 and GBS 30 for heart stimulation, temperature.  The patient  tolerated the procedure well.  Return in 1 week for repeat.      Erick Colace, M.D.  Electronically Signed     AEK/MEDQ  D:  04/15/2007 15:03:52  T:  04/15/2007 15:55:32  Job:  [id number]

## 2010-09-29 NOTE — Assessment & Plan Note (Signed)
[facility]                            [facility] OFFICE NOTE   NAME:[name], [name]                          MRN:          [id number]  DATE:08/08/2006                            DOB:          [birth year]    A 70 year old gentleman seen today for a wellness exam.  Medical  problems include DJD, hypercholesterolemia, allergic rhinitis.  He has a  history of diverticulosis, BPH and renal colic.  He has had a remote  vasectomy and tonsillectomy.   REVIEW OF SYSTEMS:  Exam is negative.  Did have a sigmoidoscopy in 2001.   FAMILY HISTORY:  Father died of lymphoma at age 40.  Mother died at 19  of complications of dementia. Family history is positive for coronary  artery disease, diabetes and colon cancer.   EXAM:  Revealed a well-developed, healthy appearing male in no acute  distress.  Fundi, ear, nose and throat negative.  Blood pressure 140/80.  NECK:  No bruits.  CHEST:  Was clear.  CARDIOVASCULAR:  Normal heart sounds.  No murmurs.  ABDOMEN:  Benign.  No organomegaly.  RECTAL:  Prostate +2 enlarged with the left lobe slightly more  prominent.  Stool was Hematest positive.  EXTREMITIES:  Negative.  The right dorsalis pedis pulse was diminished.   IMPRESSION:  1. Heme positive stool.  2. Benign prostatic hypertrophy.  3. History of diverticulosis.  4. Degenerative disc disease.   DISPOSITION:  A colonoscopy will be scheduled promptly.  Laboratory  update will be reviewed.  Medical regimen unchanged.     Gordy Savers, MD  Electronically Signed    PFK/MedQ  DD: 08/08/2006  DT: 08/08/2006  Job #: [id number]

## 2010-10-31 ENCOUNTER — Telehealth: Payer: Self-pay | Admitting: Cardiovascular Disease

## 2010-10-31 NOTE — Telephone Encounter (Signed)
Pt states he is on other generic meds but does not feel comfortable w/gerneric Plavix, he states he does not want to have another heart attack and is concerned about changing, he states his Plavix has been working fine so why change it.  Tried to reassure pt that generic is safe to take but he is insistent, he stated he has told him [employer] and they are suppose to be sending [place] something about it, advised will be checking on it and will let Dr Clifton James and his nurse Alphonzo Lemmings know

## 2010-10-31 NOTE — Telephone Encounter (Signed)
Pt's insurance wants to change him to generic plavix, they should have or will be requesting the change, pt is asking not to do this due to his "blood clotting heart attack" he is not comfortable with the change

## 2010-11-01 NOTE — Telephone Encounter (Signed)
Patient would like to remain on the brand name of Plavix. He is aware that it is safe to switch to the generic if he wants to in the future.

## 2011-01-05 ENCOUNTER — Other Ambulatory Visit: Payer: Self-pay | Admitting: Oncology

## 2011-01-08 ENCOUNTER — Other Ambulatory Visit: Payer: Self-pay | Admitting: Oncology

## 2011-01-08 DIAGNOSIS — D696 Thrombocytopenia, unspecified: Secondary | ICD-10-CM

## 2011-01-09 ENCOUNTER — Other Ambulatory Visit (HOSPITAL_COMMUNITY): Payer: Medicare Other

## 2011-01-10 ENCOUNTER — Ambulatory Visit (HOSPITAL_COMMUNITY)
Admission: RE | Admit: 2011-01-10 | Discharge: 2011-01-10 | Disposition: A | Discharge status: Home / Self Care | Payer: Medicare Other | Source: Ambulatory Visit | Attending: Oncology | Admitting: Oncology

## 2011-01-10 ENCOUNTER — Encounter (HOSPITAL_COMMUNITY): Payer: Self-pay

## 2011-01-10 ENCOUNTER — Other Ambulatory Visit: Payer: Self-pay | Admitting: Oncology

## 2011-01-10 ENCOUNTER — Encounter (HOSPITAL_BASED_OUTPATIENT_CLINIC_OR_DEPARTMENT_OTHER): Payer: Medicare Other | Admitting: Oncology

## 2011-01-10 DIAGNOSIS — D696 Thrombocytopenia, unspecified: Secondary | ICD-10-CM | POA: Insufficient documentation

## 2011-01-10 DIAGNOSIS — M47814 Spondylosis without myelopathy or radiculopathy, thoracic region: Secondary | ICD-10-CM | POA: Insufficient documentation

## 2011-01-10 DIAGNOSIS — K8020 Calculus of gallbladder without cholecystitis without obstruction: Secondary | ICD-10-CM | POA: Insufficient documentation

## 2011-01-10 DIAGNOSIS — K7689 Other specified diseases of liver: Secondary | ICD-10-CM | POA: Insufficient documentation

## 2011-01-10 DIAGNOSIS — M47817 Spondylosis without myelopathy or radiculopathy, lumbosacral region: Secondary | ICD-10-CM | POA: Insufficient documentation

## 2011-01-10 DIAGNOSIS — C8409 Mycosis fungoides, extranodal and solid organ sites: Secondary | ICD-10-CM | POA: Insufficient documentation

## 2011-01-10 LAB — BASIC METABOLIC PANEL - CANCER CENTER ONLY
CO2: 26 mEq/L (ref 18–33)
Chloride: 102 mEq/L (ref 98–108)
Glucose, Bld: 128 mg/dL — ABNORMAL HIGH (ref 73–118)
Sodium: 140 mEq/L (ref 128–145)

## 2011-01-10 MED ORDER — IOHEXOL 300 MG/ML  SOLN
100.0000 mL | Freq: Once | INTRAMUSCULAR | Status: AC | PRN
  Administered 2011-01-10: 100 mL via INTRAVENOUS

## 2011-02-01 LAB — CBC
HCT: 45
HCT: 45.3
Hemoglobin: 15.2
MCHC: 33.6
MCHC: 34.2
MCV: 94.7
MCV: 95.1
MCV: 95.8
Platelets: 115 — ABNORMAL LOW
Platelets: 117 — ABNORMAL LOW
Platelets: 130 — ABNORMAL LOW
Platelets: 132 — ABNORMAL LOW
Platelets: 143 — ABNORMAL LOW
RBC: 4.73
RDW: 12.6
WBC: 12.6 — ABNORMAL HIGH
WBC: 8
WBC: 8.2

## 2011-02-01 LAB — CARDIAC PANEL(CRET KIN+CKTOT+MB+TROPI)
CK, MB: 370.7 — ABNORMAL HIGH
CK, MB: 499.3 — ABNORMAL HIGH
Relative Index: 12.3 — ABNORMAL HIGH
Total CK: 2774 — ABNORMAL HIGH
Total CK: 4047 — ABNORMAL HIGH
Troponin I: >100

## 2011-02-01 LAB — COMPREHENSIVE METABOLIC PANEL
AST: 298 — ABNORMAL HIGH
Albumin: 3.1 — ABNORMAL LOW
CO2: 27
Calcium: 8.6
Creatinine, Ser: 1
GFR calc Af Amer: >60
GFR calc non Af Amer: >60
Total Protein: 5.4 — ABNORMAL LOW

## 2011-02-01 LAB — HEPARIN ANTIBODY SCREEN: Heparin Antibody Screen: NEGATIVE

## 2011-02-01 LAB — BASIC METABOLIC PANEL
BUN: 12
BUN: 15
CO2: 29
Chloride: 104
Creatinine, Ser: 1.05
Creatinine, Ser: 1.08
GFR calc non Af Amer: >60

## 2011-02-01 LAB — TSH: TSH: 1.065

## 2011-02-01 LAB — APTT: aPTT: 27

## 2011-02-01 LAB — LIPID PANEL
Cholesterol: 177
Triglycerides: 74

## 2011-02-02 LAB — I-STAT 8, (EC8 V) (CONVERTED LAB)
Acid-base deficit: 1
Chloride: 108
HCT: 45
Hemoglobin: 15.3
Operator id: 192461
Potassium: 4.1
Sodium: 139
pH, Ven: 7.438 — ABNORMAL HIGH

## 2011-02-02 LAB — DIFFERENTIAL
Basophils Absolute: 0
Lymphocytes Relative: 19
Monocytes Absolute: 0.4
Neutro Abs: 5.3
Neutrophils Relative %: 74

## 2011-02-02 LAB — CARDIAC PANEL(CRET KIN+CKTOT+MB+TROPI): CK, MB: 3

## 2011-02-02 LAB — CBC
Hemoglobin: 14.1
Hemoglobin: 15
MCHC: 34.1
MCV: 94.5
RBC: 4.37
RBC: 4.78
RDW: 12.6

## 2011-02-02 LAB — BASIC METABOLIC PANEL
BUN: 9
Calcium: 8.7
Creatinine, Ser: 0.84
GFR calc non Af Amer: >60
Glucose, Bld: 73

## 2011-02-02 LAB — POCT CARDIAC MARKERS
CKMB, poc: <1 — ABNORMAL LOW
Troponin i, poc: <0.05

## 2011-02-02 LAB — POCT I-STAT CREATININE: Operator id: 192461

## 2011-03-09 ENCOUNTER — Ambulatory Visit: Payer: Federal, State, Local not specified - PPO | Admitting: Internal Medicine

## 2011-03-14 ENCOUNTER — Telehealth: Payer: Self-pay | Admitting: Oncology

## 2011-03-14 ENCOUNTER — Other Ambulatory Visit: Payer: Self-pay | Admitting: Oncology

## 2011-03-14 ENCOUNTER — Encounter (HOSPITAL_BASED_OUTPATIENT_CLINIC_OR_DEPARTMENT_OTHER): Payer: Medicare Other | Admitting: Oncology

## 2011-03-14 DIAGNOSIS — D693 Immune thrombocytopenic purpura: Secondary | ICD-10-CM

## 2011-03-14 DIAGNOSIS — I2510 Atherosclerotic heart disease of native coronary artery without angina pectoris: Secondary | ICD-10-CM

## 2011-03-14 DIAGNOSIS — I252 Old myocardial infarction: Secondary | ICD-10-CM

## 2011-03-14 DIAGNOSIS — D126 Benign neoplasm of colon, unspecified: Secondary | ICD-10-CM

## 2011-03-14 LAB — CBC WITH DIFFERENTIAL/PLATELET
BASO%: 0.4 % (ref 0.0–2.0)
Basophils Absolute: 0 10*3/uL (ref 0.0–0.1)
HCT: 46.3 % (ref 38.4–49.9)
HGB: 15.4 g/dL (ref 13.0–17.1)
MONO#: 0.3 10*3/uL (ref 0.1–0.9)
NEUT%: 63 % (ref 39.0–75.0)
WBC: 4.8 10*3/uL (ref 4.0–10.3)
lymph#: 1.3 10*3/uL (ref 0.9–3.3)

## 2011-03-14 LAB — MORPHOLOGY: PLT EST: DECREASED

## 2011-03-19 ENCOUNTER — Ambulatory Visit (INDEPENDENT_AMBULATORY_CARE_PROVIDER_SITE_OTHER): Payer: Medicare Other | Admitting: *Deleted

## 2011-03-19 DIAGNOSIS — E785 Hyperlipidemia, unspecified: Secondary | ICD-10-CM

## 2011-03-19 LAB — HEPATIC FUNCTION PANEL
Bilirubin, Direct: 0.2 mg/dL (ref 0.0–0.3)
Total Bilirubin: 1 mg/dL (ref 0.3–1.2)
Total Protein: 6.3 g/dL (ref 6.0–8.3)

## 2011-03-19 LAB — LIPID PANEL
Cholesterol: 112 mg/dL (ref 0–200)
HDL: 61.9 mg/dL (ref >39.00)
Total CHOL/HDL Ratio: 2
Triglycerides: 42 mg/dL (ref 0.0–149.0)

## 2011-03-21 ENCOUNTER — Ambulatory Visit (INDEPENDENT_AMBULATORY_CARE_PROVIDER_SITE_OTHER): Payer: Medicare Other | Admitting: Cardiovascular Disease

## 2011-03-21 ENCOUNTER — Encounter: Payer: Self-pay | Admitting: Cardiovascular Disease

## 2011-03-21 VITALS — BP 110/67 | HR 57 | Ht 66.0 in | Wt 144.0 lb

## 2011-03-21 DIAGNOSIS — E785 Hyperlipidemia, unspecified: Secondary | ICD-10-CM

## 2011-03-21 DIAGNOSIS — I2510 Atherosclerotic heart disease of native coronary artery without angina pectoris: Secondary | ICD-10-CM | POA: Insufficient documentation

## 2011-03-21 MED ORDER — CARVEDILOL 3.125 MG PO TABS: 3.1250 mg | ORAL_TABLET | Freq: Two times a day (BID) | ORAL | Status: DC

## 2011-03-21 MED ORDER — LOSARTAN POTASSIUM 50 MG PO TABS: 50.0000 mg | ORAL_TABLET | Freq: Every day | ORAL | Status: DC

## 2011-03-21 MED ORDER — NITROGLYCERIN 0.4 MG SL SUBL: 0.4000 mg | SUBLINGUAL_TABLET | SUBLINGUAL | Status: DC | PRN

## 2011-03-21 MED ORDER — CLOPIDOGREL BISULFATE 75 MG PO TABS: 75.0000 mg | ORAL_TABLET | Freq: Every day | ORAL | Status: DC

## 2011-03-21 MED ORDER — ATORVASTATIN CALCIUM 20 MG PO TABS: 20.0000 mg | ORAL_TABLET | Freq: Every day | ORAL | Status: DC

## 2011-03-21 NOTE — Patient Instructions (Signed)
Your physician wants you to follow-up in: 12 months.  You will receive a reminder letter in the mail two months in advance. If you don't receive a letter, please call our office to schedule the follow-up appointment.  Your physician recommends that you continue on your current medications as directed. Please refer to the Current Medication list given to you today.   

## 2011-03-21 NOTE — Assessment & Plan Note (Signed)
Stable. No changes. His BP is well controlled. LDL is at goal on statin.

## 2011-03-21 NOTE — Progress Notes (Signed)
History of Present Illness:70 yo WM with history of CAD, HTN, hyperlipidemia, idiopathic thrombocytopenia and OSA here today for cardiac follow up. [name] has been followed in the past by Dr. Juanda Chance. I saw [name] for the first time in May 2012.  In January of 2009 [name] had an anterior MI treated with a drug-eluting stent to the LAD. His ejection fraction improved from 25% to 40-50%. Most recent echo with EF of 45%. [name] was diagnosed with T-cell lymphoma and is getting treatments at [facility] This was initially discovered on his left chest wall.   [name] has been doing well. No chest pain, SOB, palpitations, dizziness, near syncope or syncope. [name] is exercising several times per week.    Past Medical History  Diagnosis Date  . Allergy   . CAD (coronary artery disease)   . Hypertension   . Hyperlipidemia   . Arthritis   . Diverticulosis of colon   . Benign prostatic hypertrophy   . Myocardial infarction   . Thrombocytopenia   . Peripheral T cell lymphoma, unspecified site, extranodal and solid organ sites     Past Surgical History  Procedure Date  . Vasectomy   . Tonsillectomy   . Lumbar laminectomy   . Ptca     Current Outpatient Prescriptions  Medication Sig Dispense Refill  . acetaminophen (TYLENOL) 650 MG CR tablet Take 650 mg by mouth every 8 (eight) hours as needed.        . ALPRAZolam (XANAX) 0.5 MG tablet Take 1 tablet (0.5 mg total) by mouth at bedtime as needed.  60 tablet  2  . aspirin 81 MG tablet Take 81 mg by mouth daily.        Marland Kitchen atorvastatin (LIPITOR) 20 MG tablet Take 1 tablet (20 mg total) by mouth daily.  90 tablet  3  . carvedilol (COREG) 3.125 MG tablet Take 1 tablet (3.125 mg total) by mouth 2 (two) times daily with a meal.  90 tablet  6  . Cholecalciferol (VITAMIN D3) 1000 UNITS tablet Take 1,000 Units by mouth daily.        . clopidogrel (PLAVIX) 75 MG tablet Take 1 tablet (75 mg total) by mouth daily.  90 tablet  6  . losartan (COZAAR) 50 MG tablet Take 1 tablet (50 mg total)  by mouth daily.  90 tablet  6  . nitroGLYCERIN (NITROSTAT) 0.4 MG SL tablet Place 0.4 mg under the tongue every 5 (five) minutes as needed.        . Omega-3 Fatty Acids (FISH OIL) 1200 MG CAPS Take 1,200 mg by mouth 2 (two) times daily.        . Saw Palmetto 450 MG CAPS Take 450 mg by mouth 2 (two) times daily. 2 caps bid       . Ubiquinol 100 MG CAPS Take 1 capsule by mouth daily.        Marland Kitchen zolpidem (AMBIEN) 10 MG tablet Take 1 tablet (10 mg total) by mouth at bedtime as needed.  90 tablet  2    Allergies  Allergen Reactions  . Prozac (Fluoxetine Hcl)     History   Social History  . Marital Status: Married    Spouse Name: N/A    Number of Children: N/A  . Years of Education: N/A   Occupational History  . Not on file.   Social History Main Topics  . Smoking status: Never Smoker   . Smokeless tobacco: Not on file  . Alcohol Use:   .  Drug Use:   . Sexually Active:    Other Topics Concern  . Not on file   Social History Narrative  . No narrative on file    Family History  Problem Relation Age of Onset  . Alzheimer's disease Mother   . Lymphoma Father   . Coronary artery disease Brother   . COPD Brother   . Diabetes Brother   . Cancer      prostate, colon, lung    Review of Systems:  As stated in the HPI and otherwise negative.   BP 110/67  Pulse 57  Ht 5\' 6"  (1.676 m)  Wt 144 lb (65.318 kg)  BMI 23.24 kg/m2  Physical Examination: General: Well developed, well nourished, NAD HEENT: OP clear, mucus membranes moist SKIN: warm, dry. No rashes. Neuro: No focal deficits Musculoskeletal: Muscle strength 5/5 all ext Psychiatric: Mood and affect normal Neck: No JVD, no carotid bruits, no thyromegaly, no lymphadenopathy. Lungs:Clear bilaterally, no wheezes, rhonci, crackles Cardiovascular: Regular rate and rhythm. No murmurs, gallops or rubs. Abdomen:Soft. Bowel sounds present. Non-tender.  Extremities: No lower extremity edema. Pulses are 2 + in the bilateral  DP/PT.

## 2011-05-17 ENCOUNTER — Telehealth: Payer: Self-pay | Admitting: *Deleted

## 2011-05-17 NOTE — Telephone Encounter (Signed)
Pt requested to switch pcp from [place] to [employer].  Both physicians agreed to request.

## 2011-05-31 DIAGNOSIS — C4440 Unspecified malignant neoplasm of skin of scalp and neck: Secondary | ICD-10-CM | POA: Diagnosis not present

## 2011-05-31 DIAGNOSIS — D239 Other benign neoplasm of skin, unspecified: Secondary | ICD-10-CM | POA: Diagnosis not present

## 2011-05-31 DIAGNOSIS — C8409 Mycosis fungoides, extranodal and solid organ sites: Secondary | ICD-10-CM | POA: Diagnosis not present

## 2011-05-31 DIAGNOSIS — Z85828 Personal history of other malignant neoplasm of skin: Secondary | ICD-10-CM | POA: Diagnosis not present

## 2011-05-31 DIAGNOSIS — D485 Neoplasm of uncertain behavior of skin: Secondary | ICD-10-CM | POA: Diagnosis not present

## 2011-05-31 DIAGNOSIS — L821 Other seborrheic keratosis: Secondary | ICD-10-CM | POA: Diagnosis not present

## 2011-05-31 DIAGNOSIS — L570 Actinic keratosis: Secondary | ICD-10-CM | POA: Diagnosis not present

## 2011-06-07 ENCOUNTER — Telehealth: Payer: Self-pay | Admitting: *Deleted

## 2011-06-07 NOTE — Telephone Encounter (Signed)
Received vm call from Dr. Elmon Else re: mutual pt with h/o ITP & cutaneous T-cell lymphoma.  She reports doing a BX which shows possible mets to the scalp with unknown primary & reports shows poorly diff carcinoma.  She would like to talk with Dr. Cyndie Chime regarding work-up for this pt.  She can be reached at [phone].   Message to Dr. Cyndie Chime.

## 2011-06-14 ENCOUNTER — Ambulatory Visit (INDEPENDENT_AMBULATORY_CARE_PROVIDER_SITE_OTHER): Payer: Medicare Other | Admitting: Family Medicine

## 2011-06-14 ENCOUNTER — Encounter: Payer: Self-pay | Admitting: Family Medicine

## 2011-06-14 DIAGNOSIS — I10 Essential (primary) hypertension: Secondary | ICD-10-CM

## 2011-06-14 DIAGNOSIS — Z4802 Encounter for removal of sutures: Secondary | ICD-10-CM | POA: Diagnosis not present

## 2011-06-14 DIAGNOSIS — I2589 Other forms of chronic ischemic heart disease: Secondary | ICD-10-CM

## 2011-06-14 DIAGNOSIS — C8400 Mycosis fungoides, unspecified site: Secondary | ICD-10-CM | POA: Insufficient documentation

## 2011-06-14 DIAGNOSIS — D696 Thrombocytopenia, unspecified: Secondary | ICD-10-CM | POA: Diagnosis not present

## 2011-06-14 DIAGNOSIS — D693 Immune thrombocytopenic purpura: Secondary | ICD-10-CM | POA: Diagnosis not present

## 2011-06-14 DIAGNOSIS — I2510 Atherosclerotic heart disease of native coronary artery without angina pectoris: Secondary | ICD-10-CM

## 2011-06-14 DIAGNOSIS — C8409 Mycosis fungoides, extranodal and solid organ sites: Secondary | ICD-10-CM

## 2011-06-14 DIAGNOSIS — K8020 Calculus of gallbladder without cholecystitis without obstruction: Secondary | ICD-10-CM | POA: Insufficient documentation

## 2011-06-14 DIAGNOSIS — E785 Hyperlipidemia, unspecified: Secondary | ICD-10-CM | POA: Diagnosis not present

## 2011-06-14 DIAGNOSIS — Z87442 Personal history of urinary calculi: Secondary | ICD-10-CM | POA: Insufficient documentation

## 2011-06-14 DIAGNOSIS — C4499 Other specified malignant neoplasm of skin, unspecified: Secondary | ICD-10-CM | POA: Diagnosis not present

## 2011-06-14 NOTE — Progress Notes (Signed)
Subjective:    Patient ID: [name], male    DOB: [birth year], 71 y.o.   MRN: [id number]  HPI  Patient seen to establish care with me. He has past medical history significant for coronary artery disease, hypertension, benign hypertrophy prostate, obstructive sleep apnea, osteoarthritis, idiopathic thrombocytopenia purpura, and cutaneous T-cell lymphoma. He has been followed by multiple specialists. He had stent placement about 4 years ago with no recurrent chest pain. He is followed by hematology regarding his chronic thrombocytopenia which has been mild. He is followed by dermatologist regarding his cutaneous T-cell lymphoma.  Medications reviewed. Recent lipids per cardiology. Compliant with all medications. Denies side effects. History of some chronic insomnia and takes alprazolam 0.5 mg. He has seen greater help with alprazolam than Ambien.  Past Medical History  Diagnosis Date  . Allergy   . CAD (coronary artery disease)   . Hypertension   . Hyperlipidemia   . Arthritis   . Diverticulosis of colon   . Benign prostatic hypertrophy   . Myocardial infarction   . Thrombocytopenia   . Peripheral T cell lymphoma, unspecified site, extranodal and solid organ sites    Past Surgical History  Procedure Date  . Vasectomy   . Tonsillectomy   . Lumbar laminectomy   . Ptca     reports that he has never smoked. He does not have any smokeless tobacco history on file. His alcohol and drug histories not on file. family history includes Alzheimer's disease in his mother; COPD in his brother; Cancer in an unspecified family member; Coronary artery disease in his brother; Diabetes in his brother; and Lymphoma in his father. Allergies  Allergen Reactions  . Prozac (Fluoxetine Hcl)       Review of Systems  Constitutional: Negative for fever, activity change, appetite change and fatigue.  HENT: Negative for ear pain, congestion and trouble swallowing.   Eyes: Negative for pain and visual  disturbance.  Respiratory: Negative for cough, shortness of breath and wheezing.   Cardiovascular: Negative for chest pain and palpitations.  Gastrointestinal: Negative for nausea, vomiting, abdominal pain, diarrhea, constipation, blood in stool, abdominal distention and rectal pain.  Genitourinary: Negative for dysuria, hematuria and testicular pain.  Musculoskeletal: Negative for joint swelling and arthralgias.  Neurological: Negative for dizziness, syncope and headaches.  Hematological: Negative for adenopathy.  Psychiatric/Behavioral: Negative for confusion and dysphoric mood.       Objective:   Physical Exam  Constitutional: He is oriented to person, place, and time. He appears well-developed and well-nourished. No distress.  HENT:  Right Ear: External ear normal.  Left Ear: External ear normal.  Mouth/Throat: Oropharynx is clear and moist.  Neck: Neck supple. No thyromegaly present.  Cardiovascular: Normal rate and regular rhythm.   Pulmonary/Chest: Effort normal and breath sounds normal. No respiratory distress. He has no wheezes. He has no rales.  Musculoskeletal: He exhibits no edema.  Lymphadenopathy:    He has no cervical adenopathy.  Neurological: He is alert and oriented to person, place, and time. No cranial nerve deficit.  Psychiatric: He has a normal mood and affect. His behavior is normal.          Assessment & Plan:  #1 history of CAD. Continue close followup with cardiology. Medications reviewed. Compliant with all #2 hypertension adequate control. Continue Coreg and Cozaar. #3 history of chronic insomnia. Sleep hygiene discussed. Discontinue Ambien. Continue low-dose alprazolam 0.5 mg each bedtime as needed #4 history of cutaneous T-cell lymphoma. He is followed by oncology and  dermatology. #5 history of idiopathic thrombocytopenia purpura. Recent platelet stable. #6 history of dyslipidemia. Patient on statin therapy #7 health maintenance addressed.  Pneumovax up-to-date. Tetanus up to date. Flu vaccine this past October. Schedule physical for March

## 2011-06-15 ENCOUNTER — Other Ambulatory Visit (HOSPITAL_BASED_OUTPATIENT_CLINIC_OR_DEPARTMENT_OTHER): Payer: Medicare Other

## 2011-06-15 DIAGNOSIS — D126 Benign neoplasm of colon, unspecified: Secondary | ICD-10-CM | POA: Diagnosis not present

## 2011-06-15 DIAGNOSIS — D693 Immune thrombocytopenic purpura: Secondary | ICD-10-CM

## 2011-06-15 DIAGNOSIS — R490 Dysphonia: Secondary | ICD-10-CM | POA: Diagnosis not present

## 2011-06-15 DIAGNOSIS — I2510 Atherosclerotic heart disease of native coronary artery without angina pectoris: Secondary | ICD-10-CM

## 2011-06-15 DIAGNOSIS — K1120 Sialoadenitis, unspecified: Secondary | ICD-10-CM | POA: Diagnosis not present

## 2011-06-15 LAB — CBC WITH DIFFERENTIAL/PLATELET
BASO%: 0.4 % (ref 0.0–2.0)
EOS%: 1.9 % (ref 0.0–7.0)
HCT: 46.7 % (ref 38.4–49.9)
LYMPH%: 27.7 % (ref 14.0–49.0)
MCH: 33.7 pg — ABNORMAL HIGH (ref 27.2–33.4)
MCHC: 33.7 g/dL (ref 32.0–36.0)
MCV: 99.9 fL — ABNORMAL HIGH (ref 79.3–98.0)
MONO%: 7.4 % (ref 0.0–14.0)
NEUT%: 62.6 % (ref 39.0–75.0)
lymph#: 1.5 10*3/uL (ref 0.9–3.3)

## 2011-06-18 DIAGNOSIS — C4492 Squamous cell carcinoma of skin, unspecified: Secondary | ICD-10-CM | POA: Diagnosis not present

## 2011-06-18 DIAGNOSIS — C4442 Squamous cell carcinoma of skin of scalp and neck: Secondary | ICD-10-CM | POA: Diagnosis not present

## 2011-07-24 DIAGNOSIS — C8409 Mycosis fungoides, extranodal and solid organ sites: Secondary | ICD-10-CM | POA: Diagnosis not present

## 2011-07-24 DIAGNOSIS — F339 Major depressive disorder, recurrent, unspecified: Secondary | ICD-10-CM | POA: Insufficient documentation

## 2011-07-24 DIAGNOSIS — F329 Major depressive disorder, single episode, unspecified: Secondary | ICD-10-CM | POA: Insufficient documentation

## 2011-07-24 DIAGNOSIS — I252 Old myocardial infarction: Secondary | ICD-10-CM | POA: Insufficient documentation

## 2011-07-24 DIAGNOSIS — Z85828 Personal history of other malignant neoplasm of skin: Secondary | ICD-10-CM | POA: Insufficient documentation

## 2011-07-27 ENCOUNTER — Encounter: Payer: Self-pay | Admitting: Gastroenterology

## 2011-07-30 ENCOUNTER — Telehealth: Payer: Self-pay | Admitting: Gastroenterology

## 2011-07-30 ENCOUNTER — Telehealth: Payer: Self-pay | Admitting: Cardiovascular Disease

## 2011-07-30 ENCOUNTER — Telehealth: Payer: Self-pay | Admitting: *Deleted

## 2011-07-30 ENCOUNTER — Encounter: Payer: Self-pay | Admitting: Gastroenterology

## 2011-07-30 NOTE — Telephone Encounter (Signed)
07/30/11--will forward to pat and dr Clifton James for review--nt

## 2011-07-30 NOTE — Telephone Encounter (Signed)
Thanks nancy. chris

## 2011-07-30 NOTE — Telephone Encounter (Signed)
ASA and Plavix can be held for colonoscopy. His stent was in 2009. Can we let him know? Thanks, chris

## 2011-07-30 NOTE — Telephone Encounter (Signed)
3/18.13--Pt calling stating needs colonoscopy and what meds does he need to hold and for how long--pt has drug eluting stent--advised pt i would pass question along to dr Clifton James and his nurse pat and they would get back to him--if he has not heard anything by end of week to phone [place] back--nt

## 2011-07-30 NOTE — Telephone Encounter (Signed)
New Msg: Pt calling wanting to speak with nurse/MD regarding pt upcoming colonoscopy wanting to know if pt needs to stop taking any medications prior to pt procedure. Please return pt call to discuss further.

## 2011-07-30 NOTE — Telephone Encounter (Signed)
07/30/11--pt calling stating he is due to have colonoscopy

## 2011-07-30 NOTE — Telephone Encounter (Signed)
Pt will keep appt and we will get him scheduled when he comes in.  Pt agreed

## 2011-07-30 NOTE — Telephone Encounter (Signed)
07/30/11--pt calling stating he is due to have colonoscopy, and before his o.v. with dr Rob Bunting, he would like [place] to [facility] the discontinuation of plavix and ASA before procedure--per reply from dr Clifton James --it is OK for mr [place] to hold plavix and asa 7 days before procedure --i have notified dr Christella Hartigan office ([place])  andmr Fiala about up coming procedure and information about --nt

## 2011-08-06 ENCOUNTER — Encounter: Payer: Federal, State, Local not specified - PPO | Admitting: Internal Medicine

## 2011-08-06 ENCOUNTER — Encounter: Payer: Self-pay | Admitting: Family Medicine

## 2011-08-06 ENCOUNTER — Ambulatory Visit (INDEPENDENT_AMBULATORY_CARE_PROVIDER_SITE_OTHER): Payer: Medicare Other | Admitting: Family Medicine

## 2011-08-06 VITALS — BP 120/72 | HR 72 | Temp 98.3°F | Resp 12 | Ht 65.5 in | Wt 145.0 lb

## 2011-08-06 DIAGNOSIS — E785 Hyperlipidemia, unspecified: Secondary | ICD-10-CM | POA: Diagnosis not present

## 2011-08-06 DIAGNOSIS — Z0000 Encounter for general adult medical examination without abnormal findings: Secondary | ICD-10-CM | POA: Diagnosis not present

## 2011-08-06 DIAGNOSIS — I2510 Atherosclerotic heart disease of native coronary artery without angina pectoris: Secondary | ICD-10-CM | POA: Diagnosis not present

## 2011-08-06 DIAGNOSIS — N400 Enlarged prostate without lower urinary tract symptoms: Secondary | ICD-10-CM | POA: Diagnosis not present

## 2011-08-06 DIAGNOSIS — I10 Essential (primary) hypertension: Secondary | ICD-10-CM | POA: Diagnosis not present

## 2011-08-06 MED ORDER — DOXYCYCLINE HYCLATE 100 MG PO TABS: 100.0000 mg | ORAL_TABLET | Freq: Two times a day (BID) | ORAL | Status: AC

## 2011-08-06 MED ORDER — ALPRAZOLAM 0.5 MG PO TABS: 0.5000 mg | ORAL_TABLET | Freq: Two times a day (BID) | ORAL | Status: DC | PRN

## 2011-08-06 NOTE — Progress Notes (Signed)
Subjective:    Patient ID: [name], male    DOB: February 08, 1941, 71 y.o.   MRN: [id number]  HPI  Patient here for Medicare wellness exam and medical followup. He has history of CAD, hypertension, hyperlipidemia, cutaneous T-cell lymphoma, diverticulosis, history of kidney stones, BPH, ITP, and reported history of obstructive sleep apnea. He sees several specialists. Requesting refills of alprazolam which takes mostly at night for sleep. Medications are reviewed. Compliant with all. No recent chest pains.  No recent obstructive urinary symptoms.  Hypertension stable.  No dizziness and denies any recent chest pain.  Pneumovax up to date. No history of shingles vaccine. Tetanus up-to-date. Scheduled to see gastroenterologist in approximately one month  1.  Risk factors based on Past Medical , Social, and Family history reviewed as indicated below 2.  Limitations in physical activities very active. Low risk for falls 3.  Depression/mood no depression or anxiety issues 4.  Hearing no major deficits 5.  ADLs fully independent in all 6.  Cognitive function (orientation to time and place, language, writing, speech,memory) no memory deficits. No language deficits. Judgment intact 7.  Home Safety no issues 8.  Height, weight, and visual acuity. Height and weight stable. No recent visual changes. 9.  Counseling diet and exercise discussed. 10. Recommendation of preventive services. Consider shingles vaccine if covered. Long discussion with patient regarding pros and cons of PSA testing he prefers to continue with this 11. Labs based on risk factors PSA 12. Care Plan] as above.  Past Medical History  Diagnosis Date  . Allergy   . CAD (coronary artery disease)   . Hypertension   . Hyperlipidemia   . Arthritis   . Diverticulosis of colon   . Benign prostatic hypertrophy   . Myocardial infarction   . Thrombocytopenia   . Peripheral T cell lymphoma, unspecified site, extranodal and solid organ  sites    Past Surgical History  Procedure Date  . Vasectomy   . Tonsillectomy   . Lumbar laminectomy   . Ptca     reports that he has never smoked. He does not have any smokeless tobacco history on file. His alcohol and drug histories not on file. family history includes Alzheimer's disease in his mother; COPD in his brother; Cancer in an unspecified family member; Coronary artery disease in his brother; Diabetes in his brother; and Lymphoma in his father. Allergies  Allergen Reactions  . Prozac (Fluoxetine Hcl)     nighmares     Review of Systems  Constitutional: Negative for fever, activity change, appetite change and fatigue.  HENT: Negative for ear pain, congestion and trouble swallowing.   Eyes: Negative for pain and visual disturbance.  Respiratory: Negative for cough, shortness of breath and wheezing.   Cardiovascular: Negative for chest pain and palpitations.  Gastrointestinal: Negative for nausea, vomiting, abdominal pain, diarrhea, constipation, blood in stool, abdominal distention and rectal pain.  Genitourinary: Negative for dysuria, hematuria and testicular pain.  Musculoskeletal: Negative for joint swelling and arthralgias.  Skin: Negative for rash.  Neurological: Negative for dizziness, syncope and headaches.  Hematological: Negative for adenopathy.  Psychiatric/Behavioral: Negative for confusion and dysphoric mood.       Objective:   Physical Exam  Constitutional: He is oriented to person, place, and time. He appears well-developed and well-nourished. No distress.  HENT:  Head: Normocephalic and atraumatic.  Right Ear: External ear normal.  Left Ear: External ear normal.  Mouth/Throat: Oropharynx is clear and moist.  Eyes: Conjunctivae and EOM are  normal. Pupils are equal, round, and reactive to light.  Neck: Normal range of motion. Neck supple. No thyromegaly present.  Cardiovascular: Normal rate, regular rhythm and normal heart sounds.   No murmur  heard. Pulmonary/Chest: No respiratory distress. He has no wheezes. He has no rales.  Abdominal: Soft. Bowel sounds are normal. He exhibits no distension and no mass. There is no tenderness. There is no rebound and no guarding.  Genitourinary: Rectum normal and prostate normal.  Musculoskeletal: He exhibits no edema.  Lymphadenopathy:    He has no cervical adenopathy.  Neurological: He is alert and oriented to person, place, and time. He displays normal reflexes. No cranial nerve deficit.  Skin: No rash noted.  Psychiatric: He has a normal mood and affect. His behavior is normal. Judgment and thought content normal.          Assessment & Plan:  #1 health maintenance. Check covers for shingles vaccine. Hemoccult cards given. Other immunizations up to date. Check PSA after long discussion regarding pros and cons #2 history of chronic insomnia. Refill alprazolam for as needed use. Cautioned about possible side effects. #3 BPH stable on saw palmetto. #4 CAD hx.  No recent concerning symptoms. #5 hypertension. Stable.

## 2011-08-07 NOTE — Progress Notes (Signed)
Quick Note:  Pt informed ______ 

## 2011-08-13 ENCOUNTER — Telehealth: Payer: Self-pay | Admitting: Gastroenterology

## 2011-08-13 NOTE — Telephone Encounter (Signed)
The pt has been scheduled for colonoscopy on 09/04/11, he will be in on 08/27/11 to confirm this.  He is on plavix and has already gotten permission to hold this for 7 days.  I did advise the pt that the appt may need to be changed depending on what Dr Christella Hartigan decides at his office visit.  Pt agreed

## 2011-08-23 DIAGNOSIS — C4492 Squamous cell carcinoma of skin, unspecified: Secondary | ICD-10-CM | POA: Diagnosis not present

## 2011-08-23 DIAGNOSIS — D692 Other nonthrombocytopenic purpura: Secondary | ICD-10-CM | POA: Diagnosis not present

## 2011-08-23 DIAGNOSIS — B358 Other dermatophytoses: Secondary | ICD-10-CM | POA: Diagnosis not present

## 2011-08-27 ENCOUNTER — Ambulatory Visit (INDEPENDENT_AMBULATORY_CARE_PROVIDER_SITE_OTHER): Payer: Medicare Other | Admitting: Gastroenterology

## 2011-08-27 ENCOUNTER — Encounter: Payer: Self-pay | Admitting: Gastroenterology

## 2011-08-27 VITALS — BP 124/62 | HR 72 | Ht 66.0 in | Wt 143.0 lb

## 2011-08-27 DIAGNOSIS — Z7902 Long term (current) use of antithrombotics/antiplatelets: Secondary | ICD-10-CM

## 2011-08-27 DIAGNOSIS — Z860100 Personal history of colon polyps, unspecified: Secondary | ICD-10-CM

## 2011-08-27 DIAGNOSIS — D6959 Other secondary thrombocytopenia: Secondary | ICD-10-CM | POA: Diagnosis not present

## 2011-08-27 NOTE — Progress Notes (Signed)
Review of pertinent gastrointestinal problems: 1. Adenomatous polyps May 2008 colonoscopy, recall at 5 years 2. Left sided diverticulosis, on colonosocpy 2008.  HPI: This is a   very pleasant 71 year old man whom I last saw the time of a colonoscopy about 5 years ago.  Had AMI about 4 years ago. One stent, drug eluting stent was placed.  Had stopped the plavix for Mow's surgery.    His current cardiologst said he could stop plavix for 7 days. Bowels are fine, no constipation, no diarrhea, no overt bleeding.    Review of systems: Pertinent positive and negative review of systems were noted in the above HPI section. Complete review of systems was performed and was otherwise normal.    Past Medical History  Diagnosis Date  . Allergy   . CAD (coronary artery disease)   . Hypertension   . Hyperlipidemia   . Arthritis   . Diverticulosis of colon   . Benign prostatic hypertrophy   . Myocardial infarction 2009  . Thrombocytopenia   . Peripheral T cell lymphoma, unspecified site, extranodal and solid organ sites   . Anxiety   . History of colon polyps   . Depression   . History of kidney stones     Past Surgical History  Procedure Date  . Vasectomy   . Tonsillectomy   . Lumbar laminectomy   . Ptca   . Cataract extraction     Bilateral   . Mohs surgery     Current Outpatient Prescriptions  Medication Sig Dispense Refill  . acetaminophen (TYLENOL) 650 MG CR tablet Take 650 mg by mouth every 8 (eight) hours as needed.        . ALPRAZolam (XANAX) 0.5 MG tablet Take 1 tablet (0.5 mg total) by mouth 2 (two) times daily as needed.  60 tablet  5  . aspirin 81 MG tablet Take 81 mg by mouth daily.        Marland Kitchen atorvastatin (LIPITOR) 20 MG tablet Take 1 tablet (20 mg total) by mouth daily.  90 tablet  3  . b complex vitamins tablet Take 1 tablet by mouth daily.      . carvedilol (COREG) 3.125 MG tablet Take 1 tablet (3.125 mg total) by mouth 2 (two) times daily with a meal.  180 tablet   3  . Cholecalciferol (VITAMIN D3) 1000 UNITS tablet Take 1,000 Units by mouth 2 (two) times daily.       . clopidogrel (PLAVIX) 75 MG tablet Take 1 tablet (75 mg total) by mouth daily.  90 tablet  3  . losartan (COZAAR) 50 MG tablet Take 1 tablet (50 mg total) by mouth daily.  90 tablet  3  . nitroGLYCERIN (NITROSTAT) 0.4 MG SL tablet Place 1 tablet (0.4 mg total) under the tongue every 5 (five) minutes as needed for chest pain.  25 tablet  11  . Omega-3 Fatty Acids (FISH OIL) 1000 MG CAPS Take by mouth daily.      . Saw Palmetto 450 MG CAPS Take 450 mg by mouth 2 (two) times daily.       Marland Kitchen Ubiquinol 100 MG CAPS Take 1 capsule by mouth daily.          Allergies as of 08/27/2011 - Review Complete 08/06/2011  Allergen Reaction Noted  . Prozac (fluoxetine hcl)  03/21/2011    Family History  Problem Relation Age of Onset  . Alzheimer's disease Mother   . Lymphoma Father   . Coronary artery disease Brother   .  COPD Brother   . Diabetes Brother   . Colon cancer Paternal Uncle   . Diabetes Brother     History   Social History  . Marital Status: Married    Spouse Name: N/A    Number of Children: 2  . Years of Education: N/A   Occupational History  . Retired    Social History Main Topics  . Smoking status: Never Smoker   . Smokeless tobacco: Never Used  . Alcohol Use: No  . Drug Use: No  . Sexually Active: Not on file   Other Topics Concern  . Not on file   Social History Narrative   No caffeine        Physical Exam: Ht 5\' 6"  (1.676 m)  Wt 143 lb (64.864 kg)  BMI 23.08 kg/m2 Constitutional: generally well-appearing Psychiatric: alert and oriented x3 Eyes: extraocular movements intact Mouth: oral pharynx moist, no lesions Neck: supple no lymphadenopathy Cardiovascular: heart regular rate and rhythm Lungs: clear to auscultation bilaterally Abdomen: soft, nontender, nondistended, no obvious ascites, no peritoneal signs, normal bowel sounds Extremities: no lower  extremity edema bilaterally Skin: no lesions on visible extremities    Assessment and plan: 71 y.o. male with  personal history of adenomatous polyps, currently on blood thinners.  We will proceed with colonoscopy at his soonest convenience. He will hold his Plavix for 5 days prior to the procedure but he can stay on aspirin.

## 2011-08-27 NOTE — Patient Instructions (Addendum)
You will be set up for a colonoscopy for polyp surveillance (already set up). Hold plavix for 5 days prior to the colonoscopy (cardiologist already said this was OK). Remain on aspirin.  This does not need to be stopped.

## 2011-08-30 ENCOUNTER — Telehealth: Payer: Self-pay | Admitting: Gastroenterology

## 2011-08-30 MED ORDER — PEG-KCL-NACL-NASULF-NA ASC-C 100 G PO SOLR: 1.0000 | Freq: Once | ORAL | Status: DC

## 2011-08-30 NOTE — Telephone Encounter (Signed)
Notified pt I ordered his prep.

## 2011-08-31 ENCOUNTER — Other Ambulatory Visit: Payer: Medicare Other | Admitting: Gastroenterology

## 2011-09-04 ENCOUNTER — Ambulatory Visit (AMBULATORY_SURGERY_CENTER): Payer: Medicare Other | Admitting: Gastroenterology

## 2011-09-04 ENCOUNTER — Encounter: Payer: Self-pay | Admitting: Gastroenterology

## 2011-09-04 VITALS — BP 140/79 | HR 58 | Temp 97.0°F | Resp 18 | Ht 66.0 in | Wt 143.0 lb

## 2011-09-04 DIAGNOSIS — D126 Benign neoplasm of colon, unspecified: Secondary | ICD-10-CM | POA: Diagnosis not present

## 2011-09-04 DIAGNOSIS — Z86010 Personal history of colonic polyps: Secondary | ICD-10-CM | POA: Diagnosis not present

## 2011-09-04 DIAGNOSIS — K5730 Diverticulosis of large intestine without perforation or abscess without bleeding: Secondary | ICD-10-CM | POA: Diagnosis not present

## 2011-09-04 MED ORDER — SODIUM CHLORIDE 0.9 % IV SOLN: 500.0000 mL | INTRAVENOUS | Status: DC

## 2011-09-04 NOTE — Op Note (Signed)
[facility] [address] [place], [place]  [address]  COLONOSCOPY PROCEDURE REPORT  PATIENT:  [name], [name]  MR#:  [id number] BIRTHDATE:  [birth year], 70 yrs. old  GENDER:  male ENDOSCOPIST:  Rachael Fee, MD PROCEDURE DATE:  09/04/2011 PROCEDURE:  Colonoscopy with biopsy ASA CLASS:  Class II INDICATIONS:  adenomatous polyps 2008 MEDICATIONS:   Fentanyl 75 mcg IV, These medications were titrated to patient response per physician's verbal order, Versed 7 mg IV  DESCRIPTION OF PROCEDURE:   After the risks benefits and alternatives of the procedure were thoroughly explained, informed consent was obtained.  Digital rectal exam was performed and revealed no rectal masses.   The LB [id number] endoscope was introduced through the anus and advanced to the cecum, which was identified by both the appendix and ileocecal valve, without limitations.  The quality of the prep was good..  The instrument was then slowly withdrawn as the colon was fully examined. <<PROCEDUREIMAGES>> FINDINGS:  A diminutive polyp was found in the descending colon. This was removed with forceps and sent to pathology (jar 1) (see image5).  Mild diverticulosis was found in the sigmoid to descending colon segments (see image7).  This was otherwise a normal examination of the colon (see image8, image2, and image3). Retroflexed views in the rectum revealed no abnormalities. COMPLICATIONS:  None  ENDOSCOPIC IMPRESSION: 1) Diminutive polyp in the descending colon, this was removed and sent to pathology 2) Mild diverticulosis in the sigmoid to descending colon segments 3) Otherwise normal examination  RECOMMENDATIONS: 1) Given your personal history of adenomatous (pre-cancerous) polyps, you will need a repeat colonoscopy in 5 years even if the polyp removed today is NOT precancerous. 2) You will receive a letter within 1-2 weeks with the results of your biopsy as well as final recommendations.  Please call my office if you have not received a letter after 3 weeks. 3) Restart plavix today.  ______________________________ Rachael Fee, MD  n. eSIGNED:   Rachael Fee at 09/04/2011 03:07 PM  Duanne Limerick, [id number]

## 2011-09-04 NOTE — Patient Instructions (Signed)
YOU HAD AN ENDOSCOPIC PROCEDURE TODAY AT THE [facility]: Refer to the procedure report that was given to you for any specific questions about what was found during the examination.  If the procedure report does not answer your questions, please call your gastroenterologist to clarify.  If you requested that your care partner not be given the details of your procedure findings, then the procedure report has been included in a sealed envelope for you to review at your convenience later.  YOU SHOULD EXPECT: Some feelings of bloating in the abdomen. Passage of more gas than usual.  Walking can help get rid of the air that was put into your GI tract during the procedure and reduce the bloating. If you had a lower endoscopy (such as a colonoscopy or flexible sigmoidoscopy) you may notice spotting of blood in your stool or on the toilet paper. If you underwent a bowel prep for your procedure, then you may not have a normal bowel movement for a few days.  DIET: Your first meal following the procedure should be a light meal and then it is ok to progress to your normal diet.  A half-sandwich or bowl of soup is an example of a good first meal.  Heavy or fried foods are harder to digest and may make you feel nauseous or bloated.  Likewise meals heavy in dairy and vegetables can cause extra gas to form and this can also increase the bloating.  Drink plenty of fluids but you should avoid alcoholic beverages for 24 hours.  ACTIVITY: Your care partner should take you home directly after the procedure.  You should plan to take it easy, moving slowly for the rest of the day.  You can resume normal activity the day after the procedure however you should NOT DRIVE or use heavy machinery for 24 hours (because of the sedation medicines used during the test).    SYMPTOMS TO REPORT IMMEDIATELY: A gastroenterologist can be reached at any hour.  During normal business hours, 8:30 AM to 5:00 PM Monday through Friday,  call [phone].  After hours and on weekends, please call the GI answering service at [phone] who will take a message and have the physician on call contact you.   Following lower endoscopy (colonoscopy or flexible sigmoidoscopy):  Excessive amounts of blood in the stool  Significant tenderness or worsening of abdominal pains  Swelling of the abdomen that is new, acute  Fever of 100F or higher  Following upper endoscopy (EGD)  Vomiting of blood or coffee ground material  New chest pain or pain under the shoulder blades  Painful or persistently difficult swallowing  New shortness of breath  Fever of 100F or higher  Black, tarry-looking stools  FOLLOW UP: If any biopsies were taken you will be contacted by phone or by letter within the next 1-3 weeks.  Call your gastroenterologist if you have not heard about the biopsies in 3 weeks.  Our staff will call the home number listed on your records the next business day following your procedure to check on you and address any questions or concerns that you may have at that time regarding the information given to you following your procedure. This is a courtesy call and so if there is no answer at the home number and we have not heard from you through the emergency physician on call, we will assume that you have returned to your regular daily activities without incident.  SIGNATURES/CONFIDENTIALITY: You and/or your care   partner have signed paperwork which will be entered into your electronic medical record.  These signatures attest to the fact that that the information above on your After Visit Summary has been reviewed and is understood.  Full responsibility of the confidentiality of this discharge information lies with you and/or your care-partner.  

## 2011-09-04 NOTE — Progress Notes (Signed)
Patient did not have preoperative order for IV antibiotic SSI prophylaxis. (G8918)  Patient did not experience any of the following events: a burn prior to discharge; a fall within the facility; wrong site/side/patient/procedure/implant event; or a hospital transfer or hospital admission upon discharge from the facility. (G8907)  

## 2011-09-05 ENCOUNTER — Telehealth: Payer: Self-pay

## 2011-09-05 NOTE — Telephone Encounter (Signed)
  Follow up Call-  Call back number 09/04/2011  Post procedure Call Back phone  # [phone]  Permission to leave phone message Yes     Patient questions:  Do you have a fever, pain , or abdominal swelling? no Pain Score  0 *  Have you tolerated food without any problems? yes  Have you been able to return to your normal activities? yes  Do you have any questions about your discharge instructions: Diet   no Medications  no Follow up visit  no  Do you have questions or concerns about your Care? no  Actions: * If pain score is 4 or above: No action needed, pain <4.

## 2011-09-11 ENCOUNTER — Encounter: Payer: Self-pay | Admitting: Gastroenterology

## 2011-09-17 ENCOUNTER — Telehealth: Payer: Self-pay | Admitting: Oncology

## 2011-09-17 ENCOUNTER — Ambulatory Visit (HOSPITAL_BASED_OUTPATIENT_CLINIC_OR_DEPARTMENT_OTHER): Payer: Medicare Other | Admitting: Oncology

## 2011-09-17 ENCOUNTER — Other Ambulatory Visit (HOSPITAL_BASED_OUTPATIENT_CLINIC_OR_DEPARTMENT_OTHER): Payer: Medicare Other | Admitting: Lab

## 2011-09-17 VITALS — BP 91/53 | HR 56 | Temp 97.1°F | Ht 66.0 in | Wt 144.4 lb

## 2011-09-17 DIAGNOSIS — C4442 Squamous cell carcinoma of skin of scalp and neck: Secondary | ICD-10-CM | POA: Diagnosis not present

## 2011-09-17 DIAGNOSIS — D126 Benign neoplasm of colon, unspecified: Secondary | ICD-10-CM

## 2011-09-17 DIAGNOSIS — D693 Immune thrombocytopenic purpura: Secondary | ICD-10-CM | POA: Diagnosis not present

## 2011-09-17 DIAGNOSIS — C8409 Mycosis fungoides, extranodal and solid organ sites: Secondary | ICD-10-CM | POA: Diagnosis not present

## 2011-09-17 DIAGNOSIS — I2510 Atherosclerotic heart disease of native coronary artery without angina pectoris: Secondary | ICD-10-CM

## 2011-09-17 DIAGNOSIS — C84A0 Cutaneous T-cell lymphoma, unspecified, unspecified site: Secondary | ICD-10-CM

## 2011-09-17 LAB — MORPHOLOGY: PLT EST: DECREASED

## 2011-09-17 LAB — CBC WITH DIFFERENTIAL/PLATELET
Basophils Absolute: 0 10*3/uL (ref 0.0–0.1)
Eosinophils Absolute: 0.1 10*3/uL (ref 0.0–0.5)
HGB: 15.2 g/dL (ref 13.0–17.1)
LYMPH%: 29.7 % (ref 14.0–49.0)
MCV: 101.6 fL — ABNORMAL HIGH (ref 79.3–98.0)
MONO#: 0.4 10*3/uL (ref 0.1–0.9)
MONO%: 8.1 % (ref 0.0–14.0)
NEUT#: 2.6 10*3/uL (ref 1.5–6.5)
Platelets: 104 10*3/uL — ABNORMAL LOW (ref 140–400)
RBC: 4.6 10*6/uL (ref 4.20–5.82)
WBC: 4.4 10*3/uL (ref 4.0–10.3)
nRBC: 0 % (ref 0–0)

## 2011-09-17 LAB — COMPREHENSIVE METABOLIC PANEL
ALT: 27 U/L (ref 0–53)
AST: 27 U/L (ref 0–37)
Alkaline Phosphatase: 57 U/L (ref 39–117)
BUN: 15 mg/dL (ref 6–23)
Chloride: 105 mEq/L (ref 96–112)
Creatinine, Ser: 0.96 mg/dL (ref 0.50–1.35)
Potassium: 4.4 mEq/L (ref 3.5–5.3)

## 2011-09-17 LAB — CHCC SMEAR

## 2011-09-17 NOTE — Telephone Encounter (Signed)
Gv pt app for july and nov2013.  pt wanted labs for aug to be scheduled on 07/10

## 2011-09-18 NOTE — Progress Notes (Signed)
Hematology and Oncology Follow Up Visit  [name] [id number] 1941-04-14 70 y.o. 09/18/2011 11:09 AM   Principle Diagnosis: Encounter Diagnoses  Name Primary?  . Chronic ITP (idiopathic thrombocytopenia) Yes  . 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 Squamous cell carcinoma of scalp      Interim History:    A 71 year old man I have followed here since November 2009 for chronic ITP.  Platelet counts were consistently over 100,000.  Normal hemoglobin, white count and differential.  Since last visit here  there have been some new developments.  He had a patchy rash over about a 4 cm area on the left pectoral region of his chest which was there for about 7 years.  He recently changed dermatologists.  He was evaluated by Dr. Elmon Else.  She thought the lesion was suspicious and biopsied it and it turned out to be a cutaneous T-cell lymphoma.  He was referred to Dr. Sharman Crate at [facility].  Repeat biopsies were taken.  Diagnosis was confirmed.  Initial treatment is clobetasol topical steroid ointment.  This has caused complete regression of the rash.   I elected to go ahead and get CT scans of his chest, abdomen and pelvis to exclude any lymphadenopathy or organomegaly.  This was done on 01/10/2011, and I have personally reviewed the images.  Other than a huge 2.3 cm gallstone, there was no lymphadenopathy, organomegaly, or any other pathology. Getting these scans may have been fortuitous in view of the new occurrence of a lesion on his scalp which was excised on 05/31/2011 by Dr. Harolyn Rutherford and showed poorly differentiated squamous cell carcinoma. The dermatologist thought this might be metastatic disease. However the patient is a nonsmoker, does not use alcohol, and the staging CT scans done for the lymphoma just a few months prior did not show any obvious primary tumor.   He had a recent colonoscopy done by Dr. Wendall Papa on 09/04/2011. A single tubular adenoma was removed  from the descending colon.  He is otherwise doing well. He has no constitutional symptoms. No recent fever or infection. He plans to go back up to his home for the next few months.  Medications: reviewed  Allergies:  Allergies  Allergen Reactions  . Prozac (Fluoxetine Hcl)     nighmares    Review of Systems: Constitutional:   No constitutional symptoms Respiratory: No cough or dyspnea Cardiovascular:  No chest pain or palpitations Gastrointestinal: No abdominal pain or change in bowel habit Genito-Urinary: No urinary tract symptoms Musculoskeletal: No muscle or bone pain Neurologic: No headache or change in vision Skin: See discussion above Remaining ROS negative.  Physical Exam: Blood pressure 91/53, pulse 56, temperature 97.1 F (36.2 C), temperature source Oral, height 5\' 6"  (1.676 m), weight 144 lb 6.4 oz (65.499 kg). Wt Readings from Last 3 Encounters:  09/17/11 144 lb 6.4 oz (65.499 kg)  09/04/11 143 lb (64.864 kg)  08/27/11 143 lb (64.864 kg)     General appearance: Well-nourished Caucasian man HENNT: Pharynx no erythema or exudate Lymph nodes: No cervical supraclavicular or axillary adenopathy Breasts: Lungs: Clear to auscultation resonant to percussion Heart: Regular rhythm no murmur Abdomen: Soft nontender no mass no organomegaly Extremities: No edema no calf tenderness Vascular: No cyanosis Neurologic: Mental status intact, cranial nerves intact, motor strength 5 over 5, reflexes 1+ symmetric. Skin: Healed wound on the scalp from recent excision of squamous cell carcinoma.          Resolved area  of malignant dermatitis upper left pectoral region post steroid treatment  Lab Results: Lab Results  Component Value Date   WBC 4.4 09/17/2011   HGB 15.2 09/17/2011   HCT 46.7 09/17/2011   MCV 101.6* 09/17/2011   PLT 104* 09/17/2011     Chemistry      Component Value Date/Time   NA 142 09/17/2011 1429   NA 140 01/10/2011 1442   K 4.4 09/17/2011 1429   K 4.6 01/10/2011  1442   CL 105 09/17/2011 1429   CL 102 01/10/2011 1442   CO2 32 09/17/2011 1429   CO2 26 01/10/2011 1442   BUN 15 09/17/2011 1429   BUN 19 01/10/2011 1442   CREATININE 0.96 09/17/2011 1429   CREATININE 1.0 01/10/2011 1442      Component Value Date/Time   CALCIUM 9.1 09/17/2011 1429   CALCIUM 8.7 01/10/2011 1442   ALKPHOS 57 09/17/2011 1429   AST 27 09/17/2011 1429   ALT 27 09/17/2011 1429   BILITOT 1.1 09/17/2011 1429       Impression and Plan: 1. Chronic idiopathic thrombocytopenia purpura.  Possible paraneoplastic effect of recently diagnosed cutaneous T-cell lymphoma. Platelet counts remain stable at or around 100,000. 2. Cutaneous T-cell lymphoma. Minimal disease stage I, complete regression of solitary lesion treated with  topical steroids. 3. Coronary artery disease status post myocardial infarction January 2009, status post angioplasty and stent to the left anterior descending coronary artery.   4. Squamous cell carcinoma of the scalp which I believe is a primary lesion and not a metastasis. See discussion above. 5. Benign colon polyps    CC:. Dr. Beverely Low: Elmon Else: Dr. Harolyn RutherfordCorinda [facility];  Dr. Raynelle Fanning; Dr. [facility]   Levert Feinstein, MD 5/7/201311:09 AM

## 2011-09-19 ENCOUNTER — Telehealth: Payer: Self-pay | Admitting: *Deleted

## 2011-09-19 NOTE — Telephone Encounter (Signed)
Pt called stating that he saw Dr. Cyndie Chime earlier in the week & Dr. Cyndie Chime wanted the fax # & practice name for MD in [place].  He reports that it is [facility] & fax # for medical records is [phone].  He would also like a copy of his full lab work mailed to his home.  Note to Dr Cyndie Chime.

## 2011-09-20 ENCOUNTER — Telehealth: Payer: Self-pay | Admitting: *Deleted

## 2011-09-20 NOTE — Telephone Encounter (Signed)
Copy of labs done 09/17/11 faxed to [facility] & copy mailed to pt.

## 2011-11-20 DIAGNOSIS — E782 Mixed hyperlipidemia: Secondary | ICD-10-CM | POA: Diagnosis not present

## 2011-11-20 DIAGNOSIS — Z9861 Coronary angioplasty status: Secondary | ICD-10-CM | POA: Diagnosis not present

## 2011-11-20 DIAGNOSIS — D696 Thrombocytopenia, unspecified: Secondary | ICD-10-CM | POA: Diagnosis not present

## 2011-11-21 ENCOUNTER — Other Ambulatory Visit: Payer: Medicare Other | Admitting: Lab

## 2011-11-22 ENCOUNTER — Other Ambulatory Visit (HOSPITAL_BASED_OUTPATIENT_CLINIC_OR_DEPARTMENT_OTHER): Payer: Medicare Other

## 2011-11-22 DIAGNOSIS — D696 Thrombocytopenia, unspecified: Secondary | ICD-10-CM | POA: Diagnosis not present

## 2011-11-22 DIAGNOSIS — C8409 Mycosis fungoides, extranodal and solid organ sites: Secondary | ICD-10-CM | POA: Diagnosis not present

## 2011-11-22 DIAGNOSIS — C4442 Squamous cell carcinoma of skin of scalp and neck: Secondary | ICD-10-CM

## 2011-11-22 DIAGNOSIS — Z85828 Personal history of other malignant neoplasm of skin: Secondary | ICD-10-CM | POA: Diagnosis not present

## 2011-11-22 DIAGNOSIS — D239 Other benign neoplasm of skin, unspecified: Secondary | ICD-10-CM | POA: Diagnosis not present

## 2011-11-22 DIAGNOSIS — C84A0 Cutaneous T-cell lymphoma, unspecified, unspecified site: Secondary | ICD-10-CM

## 2011-11-22 DIAGNOSIS — D485 Neoplasm of uncertain behavior of skin: Secondary | ICD-10-CM | POA: Diagnosis not present

## 2011-11-22 DIAGNOSIS — D693 Immune thrombocytopenic purpura: Secondary | ICD-10-CM | POA: Diagnosis not present

## 2011-11-22 DIAGNOSIS — L570 Actinic keratosis: Secondary | ICD-10-CM | POA: Diagnosis not present

## 2011-11-22 LAB — CBC & DIFF AND RETIC
BASO%: 0.3 % (ref 0.0–2.0)
LYMPH%: 26.5 % (ref 14.0–49.0)
MCHC: 34 g/dL (ref 32.0–36.0)
MCV: 96 fL (ref 79.3–98.0)
MONO%: 4.8 % (ref 0.0–14.0)
Platelets: 110 10*3/uL — ABNORMAL LOW (ref 140–400)
RBC: 4.75 10*6/uL (ref 4.20–5.82)
RDW: 12.3 % (ref 11.0–14.6)
Retic %: 0.88 % (ref 0.80–1.80)
Retic Ct Abs: 41.8 10*3/uL (ref 34.80–93.90)
WBC: 5.9 10*3/uL (ref 4.0–10.3)

## 2011-11-22 LAB — COMPREHENSIVE METABOLIC PANEL
Albumin: 3.9 g/dL (ref 3.5–5.2)
Alkaline Phosphatase: 56 U/L (ref 39–117)
BUN: 18 mg/dL (ref 6–23)
Calcium: 8.9 mg/dL (ref 8.4–10.5)
Chloride: 108 mEq/L (ref 96–112)
Glucose, Bld: 179 mg/dL — ABNORMAL HIGH (ref 70–99)
Potassium: 3.9 mEq/L (ref 3.5–5.3)
Sodium: 142 mEq/L (ref 135–145)
Total Protein: 5.9 g/dL — ABNORMAL LOW (ref 6.0–8.3)

## 2012-01-24 DIAGNOSIS — R5381 Other malaise: Secondary | ICD-10-CM | POA: Diagnosis not present

## 2012-01-24 DIAGNOSIS — R5383 Other fatigue: Secondary | ICD-10-CM | POA: Diagnosis not present

## 2012-01-24 DIAGNOSIS — K1370 Unspecified lesions of oral mucosa: Secondary | ICD-10-CM | POA: Diagnosis not present

## 2012-03-06 ENCOUNTER — Telehealth: Payer: Self-pay | Admitting: Cardiovascular Disease

## 2012-03-06 DIAGNOSIS — C8409 Mycosis fungoides, extranodal and solid organ sites: Secondary | ICD-10-CM | POA: Diagnosis not present

## 2012-03-06 DIAGNOSIS — E785 Hyperlipidemia, unspecified: Secondary | ICD-10-CM

## 2012-03-06 DIAGNOSIS — L570 Actinic keratosis: Secondary | ICD-10-CM | POA: Diagnosis not present

## 2012-03-06 NOTE — Telephone Encounter (Signed)
Left message to call back  

## 2012-03-06 NOTE — Telephone Encounter (Signed)
Spoke with pt. He is due for lipid and liver profile. He will come in for fasting labs on March 20, 2012

## 2012-03-06 NOTE — Telephone Encounter (Signed)
F/U   Returning call back to nurse.   

## 2012-03-06 NOTE — Telephone Encounter (Signed)
New Problem:    Patient called in wanting to know if he needed to be scheduled to have lab work prior to his next appointment.  Please call back.

## 2012-03-07 DIAGNOSIS — H40019 Open angle with borderline findings, low risk, unspecified eye: Secondary | ICD-10-CM | POA: Diagnosis not present

## 2012-03-07 DIAGNOSIS — H35379 Puckering of macula, unspecified eye: Secondary | ICD-10-CM | POA: Diagnosis not present

## 2012-03-07 DIAGNOSIS — H52209 Unspecified astigmatism, unspecified eye: Secondary | ICD-10-CM | POA: Diagnosis not present

## 2012-03-07 DIAGNOSIS — H43819 Vitreous degeneration, unspecified eye: Secondary | ICD-10-CM | POA: Diagnosis not present

## 2012-03-20 ENCOUNTER — Other Ambulatory Visit (INDEPENDENT_AMBULATORY_CARE_PROVIDER_SITE_OTHER): Payer: Medicare Other

## 2012-03-20 DIAGNOSIS — E785 Hyperlipidemia, unspecified: Secondary | ICD-10-CM | POA: Diagnosis not present

## 2012-03-20 LAB — HEPATIC FUNCTION PANEL
Alkaline Phosphatase: 52 U/L (ref 39–117)
Bilirubin, Direct: 0.2 mg/dL (ref 0.0–0.3)
Total Bilirubin: 0.9 mg/dL (ref 0.3–1.2)

## 2012-03-20 LAB — LIPID PANEL
LDL Cholesterol: 46 mg/dL (ref 0–99)
Total CHOL/HDL Ratio: 2

## 2012-03-24 ENCOUNTER — Telehealth: Payer: Self-pay | Admitting: Oncology

## 2012-03-24 ENCOUNTER — Ambulatory Visit (HOSPITAL_BASED_OUTPATIENT_CLINIC_OR_DEPARTMENT_OTHER): Payer: Medicare Other | Admitting: Oncology

## 2012-03-24 ENCOUNTER — Other Ambulatory Visit (HOSPITAL_BASED_OUTPATIENT_CLINIC_OR_DEPARTMENT_OTHER): Payer: Medicare Other | Admitting: Lab

## 2012-03-24 VITALS — BP 139/73 | HR 56 | Temp 97.8°F | Resp 18 | Ht 66.0 in | Wt 144.6 lb

## 2012-03-24 DIAGNOSIS — D693 Immune thrombocytopenic purpura: Secondary | ICD-10-CM

## 2012-03-24 DIAGNOSIS — C4442 Squamous cell carcinoma of skin of scalp and neck: Secondary | ICD-10-CM

## 2012-03-24 DIAGNOSIS — C8400 Mycosis fungoides, unspecified site: Secondary | ICD-10-CM

## 2012-03-24 DIAGNOSIS — C8409 Mycosis fungoides, extranodal and solid organ sites: Secondary | ICD-10-CM

## 2012-03-24 DIAGNOSIS — C84A0 Cutaneous T-cell lymphoma, unspecified, unspecified site: Secondary | ICD-10-CM

## 2012-03-24 DIAGNOSIS — C8589 Other specified types of non-Hodgkin lymphoma, extranodal and solid organ sites: Secondary | ICD-10-CM | POA: Diagnosis not present

## 2012-03-24 LAB — CBC WITH DIFFERENTIAL/PLATELET
Basophils Absolute: 0 10*3/uL (ref 0.0–0.1)
EOS%: 2.3 % (ref 0.0–7.0)
HCT: 46.6 % (ref 38.4–49.9)
HGB: 15.3 g/dL (ref 13.0–17.1)
LYMPH%: 25.1 % (ref 14.0–49.0)
MCH: 32.8 pg (ref 27.2–33.4)
MCV: 99.8 fL — ABNORMAL HIGH (ref 79.3–98.0)
MONO%: 6.9 % (ref 0.0–14.0)
NEUT%: 65.5 % (ref 39.0–75.0)
Platelets: 103 10*3/uL — ABNORMAL LOW (ref 140–400)
lymph#: 1.4 10*3/uL (ref 0.9–3.3)

## 2012-03-24 NOTE — Telephone Encounter (Signed)
Gave pt appt for lab and MD for May 2014 °

## 2012-03-24 NOTE — Progress Notes (Signed)
Hematology and Oncology Follow Up Visit  [name] [id number] Feb 05, 1941 71 y.o. 03/24/2012 8:17 PM   Principle Diagnosis: Encounter Diagnoses  Name Primary?  . ITP (idiopathic thrombocytopenic purpura) Yes  . Mycosis fungoides, stage 1      Interim History:   Followup visit for this 71 year old man with chronic ITP not requiring any treatment who was found to have a cutaneous T-cell lymphoma when a chronic area of  localized dermatitis on his left chest wall was biopsied in approximately  April 2013. He had no lymphadenopathy on exam and no adenopathy or organomegaly on CT scans. He was evaluated by Dr. Raynelle Fanning at [facility]. Given the limited extent of disease, topical clobetasol a ointment was prescribed. He had a nice response with resolution of the initial lesion. This has flared up occasionally over time and has continued to respond to topical steroids.  Since his visit with me 6 months ago, he has had some additional lesion show up along the belt line. He developed oral Candida when he went back on additional topical steroids. It was felt perhaps that he over absorbed the steroids do to the pressure of his belt over the area where the steroids were applied.  He reports sudden onset of patchy erythema of both of his thighs after a shower which resolved on its own.  He has had no other interim problems.  Medications: reviewed  Allergies:  Allergies  Allergen Reactions  . Other     Antidepressants...has a variety of reactions to different antidepressants  . Prozac (Fluoxetine Hcl)     nighmares    Review of Systems: Constitutional:   No constitutional symptoms Respiratory: No cough or dyspnea Cardiovascular:  No chest pain or palpitations Gastrointestinal: No abdominal pain. Occasional constipation relieved with as needed laxatives Genito-Urinary: No urinary tract symptoms. Nocturia x1 Musculoskeletal: No muscle or bone pain Neurologic: No headache or change in  vision Skin: See above Remaining ROS negative.  Physical Exam: Blood pressure 139/73, pulse 56, temperature 97.8 F (36.6 C), temperature source Oral, resp. rate 18, height 5\' 6"  (1.676 m), weight 144 lb 9.6 oz (65.59 kg). Wt Readings from Last 3 Encounters:  03/24/12 144 lb 9.6 oz (65.59 kg)  09/17/11 144 lb 6.4 oz (65.499 kg)  09/04/11 143 lb (64.864 kg)     General appearance: Thin but adequately nourished Caucasian man HENNT: Pharynx no erythema or exudate Lymph nodes: No cervical, supraclavicular, or axillary adenopathy Breasts: Lungs: Clear to auscultation resonant to percussion Heart: Regular rhythm no murmur Abdomen: Soft, nontender, no mass, no organomegaly Extremities: No edema, no calf tenderness Vascular: No cyanosis Neurologic: Mental status intact, cranial nerves grossly normal, motor strength 5 over 5, reflexes 1+ symmetric Skin: Small, residual, 5 x 5 mm area erythematous scaly plaque upper left pectoral region  Lab Results: Lab Results  Component Value Date   WBC 5.7 03/24/2012   HGB 15.3 03/24/2012   HCT 46.6 03/24/2012   MCV 99.8* 03/24/2012   PLT 103* 03/24/2012     Chemistry      Component Value Date/Time   NA 142 11/22/2011 1324   NA 140 01/10/2011 1442   K 3.9 11/22/2011 1324   K 4.6 01/10/2011 1442   CL 108 11/22/2011 1324   CL 102 01/10/2011 1442   CO2 25 11/22/2011 1324   CO2 26 01/10/2011 1442   BUN 18 11/22/2011 1324   BUN 19 01/10/2011 1442   CREATININE 0.96 11/22/2011 1324   CREATININE 1.0 01/10/2011 1442  Component Value Date/Time   CALCIUM 8.9 11/22/2011 1324   CALCIUM 8.7 01/10/2011 1442   ALKPHOS 52 03/20/2012 0831   AST 24 03/20/2012 0831   ALT 33 03/20/2012 0831   BILITOT 0.9 03/20/2012 0831       Impression and Plan: 1. Chronic idiopathic thrombocytopenia purpura. Possible paraneoplastic effect of  cutaneous T-cell lymphoma. Platelet counts remain stable at or around 100,000. 2. Cutaneous T-cell lymphoma. Minimal disease stage I,  complete regression of solitary lesion treated with topical steroids with intermittent flareups and some limited new lesions in the lower abdomen which also responded nicely to topical steroids.. 3. Coronary artery disease status post myocardial infarction January 2009, status post angioplasty and stent to the left anterior descending coronary artery.  4. Squamous cell carcinoma of the scalp which I believe is a primary lesion and not a metastasis. 5. Benign colon polyps    CC:. Dr. Beverely Low; Dr. Elmon Else; Dr. Raynelle Fanning at [facility], [place] 11/11/20138:17 PM

## 2012-03-25 ENCOUNTER — Ambulatory Visit (INDEPENDENT_AMBULATORY_CARE_PROVIDER_SITE_OTHER): Payer: Medicare Other | Admitting: Cardiovascular Disease

## 2012-03-25 ENCOUNTER — Encounter: Payer: Self-pay | Admitting: Cardiovascular Disease

## 2012-03-25 VITALS — BP 123/73 | HR 54 | Ht 66.0 in | Wt 145.0 lb

## 2012-03-25 DIAGNOSIS — C8400 Mycosis fungoides, unspecified site: Secondary | ICD-10-CM

## 2012-03-25 DIAGNOSIS — C8409 Mycosis fungoides, extranodal and solid organ sites: Secondary | ICD-10-CM

## 2012-03-25 DIAGNOSIS — D693 Immune thrombocytopenic purpura: Secondary | ICD-10-CM | POA: Diagnosis not present

## 2012-03-25 DIAGNOSIS — I2510 Atherosclerotic heart disease of native coronary artery without angina pectoris: Secondary | ICD-10-CM | POA: Diagnosis not present

## 2012-03-25 DIAGNOSIS — E785 Hyperlipidemia, unspecified: Secondary | ICD-10-CM

## 2012-03-25 DIAGNOSIS — Z23 Encounter for immunization: Secondary | ICD-10-CM | POA: Diagnosis not present

## 2012-03-25 MED ORDER — CLOPIDOGREL BISULFATE 75 MG PO TABS: 75.0000 mg | ORAL_TABLET | Freq: Every day | ORAL | Status: DC

## 2012-03-25 MED ORDER — CARVEDILOL 3.125 MG PO TABS: 3.1250 mg | ORAL_TABLET | Freq: Two times a day (BID) | ORAL | Status: DC

## 2012-03-25 MED ORDER — LOSARTAN POTASSIUM 50 MG PO TABS: 50.0000 mg | ORAL_TABLET | Freq: Every day | ORAL | Status: DC

## 2012-03-25 MED ORDER — ATORVASTATIN CALCIUM 20 MG PO TABS: 20.0000 mg | ORAL_TABLET | Freq: Every day | ORAL | Status: DC

## 2012-03-25 MED ORDER — NITROGLYCERIN 0.4 MG SL SUBL: 0.4000 mg | SUBLINGUAL_TABLET | SUBLINGUAL | Status: DC | PRN

## 2012-03-25 NOTE — Progress Notes (Signed)
History of Present Illness: 71 yo WM with history of CAD, HTN, hyperlipidemia, idiopathic thrombocytopenia and OSA here today for cardiac follow up. He has been followed in the past by Dr. Juanda Chance. I saw him for the first time in May 2012. He was last seen in our office November 2012. In January of 2009 he had an anterior MI treated with a drug-eluting stent to the LAD. His ejection fraction improved from 25% to 40-50%. Most recent echo with EF of 45%. He was diagnosed with T-cell lymphoma and is getting treatments at [facility]. This was initially discovered on his left chest wall.   He has been doing well recently.He has had recurrence of his lymphoma. His regimen for lymphoma was restarted and he began to have fatigue.  No chest pain or palpitations, dizziness, near syncope or syncope. He does note continued fatigue. This has improved some since his medication for his lymphoma was stopped. Brother with AAA, asking for screening for AAA.   Primary Care Physician: Evelena Peat  Last Lipid Profile:Lipid Panel     Component Value Date/Time   CHOL 105 03/20/2012 0831   TRIG 49.0 03/20/2012 0831   HDL 48.80 03/20/2012 0831   CHOLHDL 2 03/20/2012 0831   VLDL 9.8 03/20/2012 0831   LDLCALC 46 03/20/2012 0831    Past Medical History  Diagnosis Date  . Allergy   . CAD (coronary artery disease)   . Hypertension   . Hyperlipidemia   . Arthritis   . Diverticulosis of colon   . Benign prostatic hypertrophy   . Myocardial infarction 2009  . Thrombocytopenia   . Peripheral T cell lymphoma, unspecified site, extranodal and solid organ sites   . Anxiety   . History of colon polyps   . Depression   . History of kidney stones   . Cataract   . Clotting disorder     Past Surgical History  Procedure Date  . Vasectomy   . Tonsillectomy   . Lumbar laminectomy   . Ptca   . Cataract extraction     Bilateral   . Mohs surgery   . Colonoscopy     Current Outpatient Prescriptions  Medication Sig  Dispense Refill  . acetaminophen (TYLENOL) 650 MG CR tablet Take 650 mg by mouth every 8 (eight) hours as needed.        . ALPRAZolam (XANAX) 0.5 MG tablet Take 1 tablet (0.5 mg total) by mouth 2 (two) times daily as needed.  60 tablet  5  . aspirin 81 MG tablet Take 81 mg by mouth daily.        Marland Kitchen b complex vitamins tablet Take 1 tablet by mouth daily.      . carvedilol (COREG) 3.125 MG tablet Take 1 tablet (3.125 mg total) by mouth 2 (two) times daily with a meal.  180 tablet  3  . Cholecalciferol (VITAMIN D3) 1000 UNITS tablet Take 1,000 Units by mouth 2 (two) times daily.       . clopidogrel (PLAVIX) 75 MG tablet Take 1 tablet (75 mg total) by mouth daily.  90 tablet  3  . doxycycline (VIBRA-TABS) 100 MG tablet As needed      . losartan (COZAAR) 50 MG tablet Take 1 tablet (50 mg total) by mouth daily.  90 tablet  3  . nitroGLYCERIN (NITROSTAT) 0.4 MG SL tablet Place 0.4 mg under the tongue every 5 (five) minutes as needed.      . Omega-3 Fatty Acids (FISH OIL) 1000 MG  CAPS Take 1,000 mg by mouth 2 (two) times daily.       . Saw Palmetto 450 MG CAPS Take 450 mg by mouth 2 (two) times daily.       Marland Kitchen atorvastatin (LIPITOR) 20 MG tablet Take 1 tablet (20 mg total) by mouth daily.  90 tablet  3  . Ubiquinol 100 MG CAPS Take 1 capsule by mouth daily.          Allergies  Allergen Reactions  . Other     Antidepressants...has a variety of reactions to different antidepressants  . Prozac (Fluoxetine Hcl)     nighmares    History   Social History  . Marital Status: Married    Spouse Name: N/A    Number of Children: 2  . Years of Education: N/A   Occupational History  . Retired    Social History Main Topics  . Smoking status: Never Smoker   . Smokeless tobacco: Never Used  . Alcohol Use: No  . Drug Use: No  . Sexually Active: Not on file   Other Topics Concern  . Not on file   Social History Narrative   No caffeine     Family History  Problem Relation Age of Onset  .  Alzheimer's disease Mother   . Lymphoma Father   . Coronary artery disease Brother   . COPD Brother   . Diabetes Brother   . Colon cancer Paternal Uncle   . Diabetes Brother     Review of Systems:  As stated in the HPI and otherwise negative.   BP 123/73  Pulse 54  Ht 5\' 6"  (1.676 m)  Wt 145 lb (65.772 kg)  BMI 23.40 kg/m2  Physical Examination: General: Well developed, well nourished, NAD HEENT: OP clear, mucus membranes moist SKIN: warm, dry. No rashes. Neuro: No focal deficits Musculoskeletal: Muscle strength 5/5 all ext Psychiatric: Mood and affect normal Neck: No JVD, no carotid bruits, no thyromegaly, no lymphadenopathy. Lungs:Clear bilaterally, no wheezes, rhonci, crackles Cardiovascular: Regular rate and rhythm. No murmurs, gallops or rubs. Abdomen:Soft. Bowel sounds present. Non-tender.  Extremities: No lower extremity edema. Pulses are 2 + in the bilateral DP/PT.  EKG: Sinus brady, rate 54 bpm. Poor R wave progression precordial leads.   Echo 03/07/10:  Left ventricle: Distal anterior wall, septal and apical hypokinesis The cavity size was mildly dilated. The estimated ejection fraction was 45%. - Atrial septum: No defect or patent foramen ovale was identified.   Assessment and Plan:   1. CAD: Stable. No changes in meds. His BP is well controlled. LDL is at goal on statin. Will repeat echo with recent fatigue.   2. PAD screening: Brother with AAA. Will screen with aortic u/s.

## 2012-03-25 NOTE — Patient Instructions (Addendum)
Your physician wants you to follow-up in:  12 months. You will receive a reminder letter in the mail two months in advance. If you don't receive a letter, please call our office to schedule the follow-up appointment.  Your physician has requested that you have an abdominal aorta duplex. During this test, an ultrasound is used to evaluate the aorta. Allow 30 minutes for this exam. Do not eat after midnight the day before and avoid carbonated beverages  Your physician has requested that you have an echocardiogram. Echocardiography is a painless test that uses sound waves to create images of your heart. It provides your doctor with information about the size and shape of your heart and how well your heart's chambers and valves are working. This procedure takes approximately one hour. There are no restrictions for this procedure.

## 2012-03-28 ENCOUNTER — Ambulatory Visit (HOSPITAL_COMMUNITY): Payer: Medicare Other | Attending: Cardiology

## 2012-03-28 DIAGNOSIS — E785 Hyperlipidemia, unspecified: Secondary | ICD-10-CM | POA: Diagnosis not present

## 2012-03-28 DIAGNOSIS — I2510 Atherosclerotic heart disease of native coronary artery without angina pectoris: Secondary | ICD-10-CM | POA: Insufficient documentation

## 2012-03-28 DIAGNOSIS — G4730 Sleep apnea, unspecified: Secondary | ICD-10-CM | POA: Insufficient documentation

## 2012-03-28 DIAGNOSIS — D696 Thrombocytopenia, unspecified: Secondary | ICD-10-CM | POA: Diagnosis not present

## 2012-03-28 DIAGNOSIS — I252 Old myocardial infarction: Secondary | ICD-10-CM | POA: Diagnosis not present

## 2012-03-28 DIAGNOSIS — I10 Essential (primary) hypertension: Secondary | ICD-10-CM | POA: Insufficient documentation

## 2012-03-28 DIAGNOSIS — C8409 Mycosis fungoides, extranodal and solid organ sites: Secondary | ICD-10-CM | POA: Insufficient documentation

## 2012-03-28 NOTE — Progress Notes (Signed)
Echocardiogram performed.  

## 2012-04-07 DIAGNOSIS — K146 Glossodynia: Secondary | ICD-10-CM | POA: Diagnosis not present

## 2012-04-07 DIAGNOSIS — C8409 Mycosis fungoides, extranodal and solid organ sites: Secondary | ICD-10-CM | POA: Diagnosis not present

## 2012-04-21 ENCOUNTER — Encounter (INDEPENDENT_AMBULATORY_CARE_PROVIDER_SITE_OTHER): Payer: Medicare Other

## 2012-04-21 ENCOUNTER — Telehealth: Payer: Self-pay | Admitting: Cardiovascular Disease

## 2012-04-21 ENCOUNTER — Other Ambulatory Visit: Payer: Self-pay

## 2012-04-21 DIAGNOSIS — I10 Essential (primary) hypertension: Secondary | ICD-10-CM | POA: Diagnosis not present

## 2012-04-21 DIAGNOSIS — C8400 Mycosis fungoides, unspecified site: Secondary | ICD-10-CM

## 2012-04-21 DIAGNOSIS — D693 Immune thrombocytopenic purpura: Secondary | ICD-10-CM

## 2012-04-21 DIAGNOSIS — I2510 Atherosclerotic heart disease of native coronary artery without angina pectoris: Secondary | ICD-10-CM

## 2012-04-21 MED ORDER — NITROGLYCERIN 0.4 MG SL SUBL: 0.4000 mg | SUBLINGUAL_TABLET | SUBLINGUAL | Status: DC | PRN

## 2012-04-21 NOTE — Telephone Encounter (Signed)
Walk In Pt Form " pt Needs Nitrostat Called In " Sent to [facility] 04/21/12/KM

## 2012-06-28 ENCOUNTER — Other Ambulatory Visit: Payer: Self-pay

## 2012-07-10 ENCOUNTER — Other Ambulatory Visit: Payer: Self-pay | Admitting: Dermatology

## 2012-07-10 DIAGNOSIS — L570 Actinic keratosis: Secondary | ICD-10-CM | POA: Diagnosis not present

## 2012-07-10 DIAGNOSIS — C8409 Mycosis fungoides, extranodal and solid organ sites: Secondary | ICD-10-CM | POA: Diagnosis not present

## 2012-07-10 DIAGNOSIS — D239 Other benign neoplasm of skin, unspecified: Secondary | ICD-10-CM | POA: Diagnosis not present

## 2012-07-10 DIAGNOSIS — D485 Neoplasm of uncertain behavior of skin: Secondary | ICD-10-CM | POA: Diagnosis not present

## 2012-07-10 DIAGNOSIS — L820 Inflamed seborrheic keratosis: Secondary | ICD-10-CM | POA: Diagnosis not present

## 2012-07-10 DIAGNOSIS — Z85828 Personal history of other malignant neoplasm of skin: Secondary | ICD-10-CM | POA: Diagnosis not present

## 2012-07-10 DIAGNOSIS — B353 Tinea pedis: Secondary | ICD-10-CM | POA: Diagnosis not present

## 2012-07-22 DIAGNOSIS — Z85828 Personal history of other malignant neoplasm of skin: Secondary | ICD-10-CM | POA: Diagnosis not present

## 2012-07-22 DIAGNOSIS — C8409 Mycosis fungoides, extranodal and solid organ sites: Secondary | ICD-10-CM | POA: Diagnosis not present

## 2012-08-07 ENCOUNTER — Encounter: Payer: Self-pay | Admitting: Family Medicine

## 2012-08-07 ENCOUNTER — Ambulatory Visit (INDEPENDENT_AMBULATORY_CARE_PROVIDER_SITE_OTHER): Payer: Medicare Other | Admitting: Family Medicine

## 2012-08-07 VITALS — BP 130/80 | HR 72 | Temp 97.7°F | Resp 12 | Ht 65.75 in | Wt 146.0 lb

## 2012-08-07 DIAGNOSIS — G4700 Insomnia, unspecified: Secondary | ICD-10-CM

## 2012-08-07 DIAGNOSIS — F5104 Psychophysiologic insomnia: Secondary | ICD-10-CM

## 2012-08-07 DIAGNOSIS — I2510 Atherosclerotic heart disease of native coronary artery without angina pectoris: Secondary | ICD-10-CM | POA: Diagnosis not present

## 2012-08-07 DIAGNOSIS — N320 Bladder-neck obstruction: Secondary | ICD-10-CM

## 2012-08-07 DIAGNOSIS — N400 Enlarged prostate without lower urinary tract symptoms: Secondary | ICD-10-CM

## 2012-08-07 DIAGNOSIS — Z0000 Encounter for general adult medical examination without abnormal findings: Secondary | ICD-10-CM

## 2012-08-07 LAB — PSA: PSA: 1.26 ng/mL (ref 0.10–4.00)

## 2012-08-07 MED ORDER — ALPRAZOLAM 0.5 MG PO TABS: 0.5000 mg | ORAL_TABLET | Freq: Two times a day (BID) | ORAL | Status: DC | PRN

## 2012-08-07 MED ORDER — DOXYCYCLINE HYCLATE 100 MG PO TABS: 100.0000 mg | ORAL_TABLET | Freq: Two times a day (BID) | ORAL | Status: DC

## 2012-08-07 NOTE — Progress Notes (Signed)
Subjective:    Patient ID: [name], male    DOB: January 29, 1941, 72 y.o.   MRN: [id number]  HPI  Patient seen for medical followup and Medicare wellness exam Multiple chronic problems including history of hyperlipidemia, CAD, history of ITP, cutaneous T-cell lymphoma, history of kidney stones, BPH, diverticulosis of the colon, and hypertension. He is followed closely by multiple specialists including cardiology, dermatology, and oncology.  Patient has history of chronic insomnia. He takes alprazolam 0.5 mg at night. No recent falls. Denies any depression issues.  Past Medical History  Diagnosis Date  . Allergy   . CAD (coronary artery disease)   . Hypertension   . Hyperlipidemia   . Arthritis   . Diverticulosis of colon   . Benign prostatic hypertrophy   . Myocardial infarction 2009  . Thrombocytopenia   . Peripheral T cell lymphoma, unspecified site, extranodal and solid organ sites   . Anxiety   . History of colon polyps   . Depression   . History of kidney stones   . Cataract   . Clotting disorder    Past Surgical History  Procedure Laterality Date  . Vasectomy    . Tonsillectomy    . Lumbar laminectomy    . Ptca    . Cataract extraction      Bilateral   . Mohs surgery    . Colonoscopy      reports that he has never smoked. He has never used smokeless tobacco. He reports that he does not drink alcohol or use illicit drugs. family history includes Alzheimer's disease in his mother; COPD in his brother; Colon cancer in his paternal uncle; Coronary artery disease in his brother; Diabetes in his brothers; and Lymphoma in his father. Allergies  Allergen Reactions  . Other     Antidepressants...has a variety of reactions to different antidepressants  . Prozac (Fluoxetine Hcl)     nighmares   1.  Risk factors based on Past Medical , Social, and Family history reviewed and as above 2.  Limitations in physical activities no recent falls 3.  Depression/mood no depression  no depression or anxiety issues 4.  Hearing no impairments 5.  ADLs independent in all 6.  Cognitive function (orientation to time and place, language, writing, speech,memory) no memory deficits. 7.  Home Safety no issues identified 8.  Height, weight, and visual acuity. Height and weight are stable. 9.  Counseling discussed the importance of ongoing exercise and fall risk reduction 10. Recommendation of preventive services. Patient denied shingles vaccine but would not give because of cutaneous T-cell lymphoma history 11. Labs based on risk factors PSA after long discussion of pros and cons of PSA testing 12. Care Plan as above   Review of Systems  Constitutional: Negative for fever, activity change, appetite change and fatigue.  HENT: Negative for ear pain, congestion and trouble swallowing.   Eyes: Negative for pain and visual disturbance.  Respiratory: Negative for cough, shortness of breath and wheezing.   Cardiovascular: Negative for chest pain and palpitations.  Gastrointestinal: Negative for nausea, vomiting, abdominal pain, diarrhea, constipation, blood in stool, abdominal distention and rectal pain.  Genitourinary: Negative for dysuria, hematuria and testicular pain.  Musculoskeletal: Negative for joint swelling and arthralgias.  Skin: Negative for rash.  Neurological: Negative for dizziness, syncope and headaches.  Hematological: Negative for adenopathy.  Psychiatric/Behavioral: Negative for confusion and dysphoric mood.       Objective:   Physical Exam  Constitutional: He is oriented to person, place,  and time. He appears well-developed and well-nourished. No distress.  HENT:  Head: Normocephalic and atraumatic.  Right Ear: External ear normal.  Left Ear: External ear normal.  Mouth/Throat: Oropharynx is clear and moist.  Eyes: Conjunctivae and EOM are normal. Pupils are equal, round, and reactive to light.  Neck: Normal range of motion. Neck supple. No thyromegaly  present.  Cardiovascular: Normal rate, regular rhythm and normal heart sounds.   No murmur heard. Pulmonary/Chest: No respiratory distress. He has no wheezes. He has no rales.  Abdominal: Soft. Bowel sounds are normal. He exhibits no distension and no mass. There is no tenderness. There is no rebound and no guarding.  Musculoskeletal: He exhibits no edema.  Lymphadenopathy:    He has no cervical adenopathy.  Neurological: He is alert and oriented to person, place, and time. He displays normal reflexes. No cranial nerve deficit.  Skin: No rash noted.  Psychiatric: He has a normal mood and affect.          Assessment & Plan:  #1 wellness issues. PSA per patient request. We discussed pros and cons of treatment and testing. No shingles vaccine because of issues above with history of cutaneous T-cell lymphoma. Other immunizations up-to-date. Recent colonoscopy normal #2 neck history of cutaneous T-cell lymphoma followed by oncology #3 chronic insomnia. Refill alprazolam. Caution about fall risk. Sleep hygiene discussed #4 history of CAD. Followup cardiology. No recent concerning symptoms.

## 2012-09-15 ENCOUNTER — Telehealth: Payer: Self-pay | Admitting: Cardiovascular Disease

## 2012-09-15 NOTE — Telephone Encounter (Signed)
New Prob     Pt states that when he is at rest, he experiences some spasms in his chest area, no pain. Would like to speak to nurse.

## 2012-09-15 NOTE — Telephone Encounter (Signed)
For 2-3 days pt has had a sensation mid chest. described as a spasm sensation that he feels when he sits or lays down, it lasts only seconds but he feels it every 4-5 seconds. He is able to sleep through it, he feels ok when moving around, denies sob/pain/nausea. Pulse range from 59-70,  Pt felt this sensation in the past but is now more frequent. He wants your opinion, does he need to be seen? Told him Dr/Nurse out of office till mid week and to call back or go to ER if pain occurs. He agreed to plan, please advise.

## 2012-09-16 NOTE — Telephone Encounter (Signed)
Spoke with [name]'s wife. [name] has dental appt at 11:00 on Sep 18, 2012 and cannot be here at 11:45.  Appt made for [name] to see Dr. Clifton James at 2:00 on Sep 18, 2012

## 2012-09-16 NOTE — Telephone Encounter (Signed)
I can see him Thursday at 11:45 if he wants to come in. If he wants to be seen sooner could we add him on to see Lorin Picket or Lawson Fiscal? chris

## 2012-09-18 ENCOUNTER — Ambulatory Visit (INDEPENDENT_AMBULATORY_CARE_PROVIDER_SITE_OTHER): Payer: Medicare Other | Admitting: Cardiovascular Disease

## 2012-09-18 ENCOUNTER — Encounter: Payer: Self-pay | Admitting: Cardiovascular Disease

## 2012-09-18 VITALS — BP 130/74 | HR 58 | Ht 66.0 in | Wt 145.0 lb

## 2012-09-18 DIAGNOSIS — R079 Chest pain, unspecified: Secondary | ICD-10-CM

## 2012-09-18 DIAGNOSIS — I2510 Atherosclerotic heart disease of native coronary artery without angina pectoris: Secondary | ICD-10-CM | POA: Diagnosis not present

## 2012-09-18 NOTE — Progress Notes (Signed)
History of Present Illness: 72 yo WM with history of CAD, HTN, hyperlipidemia, idiopathic thrombocytopenia and OSA here today for cardiac follow up. He has been followed in the past by Dr. Juanda Graham. I saw him for the first time in May 2012. He was last seen in our office November 2012. In January of 2009 he had an anterior MI treated with a drug-eluting stent to the LAD. His ejection fraction improved from 25% to 40-50%. Most recent echo with EF of 45%. He was diagnosed with T-cell lymphoma and is getting treatments at [facility]. This was initially discovered on his left chest wall. He has had recurrence of his lymphoma. His regimen for lymphoma was restarted and he began to have fatigue. At last f/u he denied chest pain or palpitations, dizziness, near syncope or syncope. He did note continued fatigue. This improved when his medications for his lymphoma was stopped. Brother with AAA, asking for screening for AAA. He called our office this week c/o chest pain.   He tells me today that he has been having spasms in his chest for last few days. This is under his left nipple. This lasted for a few seconds. Not associated with meals or exertion.   Primary Care Physician: [name]  Last Lipid Profile:Lipid Panel     Component Value Date/Time   CHOL 105 03/20/2012 0831   TRIG 49.0 03/20/2012 0831   HDL 48.80 03/20/2012 0831   CHOLHDL 2 03/20/2012 0831   VLDL 9.8 03/20/2012 0831   LDLCALC 46 03/20/2012 0831     Past Medical History  Diagnosis Date  . Allergy   . CAD (coronary artery disease)   . Hypertension   . Hyperlipidemia   . Arthritis   . Diverticulosis of colon   . Benign prostatic hypertrophy   . Myocardial infarction 2009  . Thrombocytopenia   . Peripheral T cell lymphoma, unspecified site, extranodal and solid organ sites   . Anxiety   . History of colon polyps   . Depression   . History of kidney stones   . Cataract   . Clotting disorder     Past Surgical History  Procedure  Laterality Date  . Vasectomy    . Tonsillectomy    . Lumbar laminectomy    . Ptca    . Cataract extraction      Bilateral   . Mohs surgery    . Colonoscopy      Current Outpatient Prescriptions  Medication Sig Dispense Refill  . acetaminophen (TYLENOL) 650 MG CR tablet Take 650 mg by mouth every 8 (eight) hours as needed.        . ALPRAZolam (XANAX) 0.5 MG tablet Take 1 tablet (0.5 mg total) by mouth 2 (two) times daily as needed.  60 tablet  5  . aspirin 81 MG tablet Take 81 mg by mouth daily.        Marland Kitchen atorvastatin (LIPITOR) 20 MG tablet Take 1 tablet (20 mg total) by mouth daily.  90 tablet  3  . carvedilol (COREG) 3.125 MG tablet Take 1 tablet (3.125 mg total) by mouth 2 (two) times daily with a meal.  180 tablet  3  . Cholecalciferol (VITAMIN D3) 1000 UNITS tablet Take 1,000 Units by mouth 2 (two) times daily.       . clopidogrel (PLAVIX) 75 MG tablet Take 1 tablet (75 mg total) by mouth daily.  90 tablet  3  . doxycycline (VIBRA-TABS) 100 MG tablet Take 1 tablet (100 mg total)  by mouth 2 (two) times daily. As needed  14 tablet  0  . losartan (COZAAR) 50 MG tablet Take 1 tablet (50 mg total) by mouth daily.  90 tablet  3  . nitroGLYCERIN (NITROSTAT) 0.4 MG SL tablet Place 1 tablet (0.4 mg total) under the tongue every 5 (five) minutes as needed.  25 tablet  3  . Omega-3 Fatty Acids (FISH OIL) 1000 MG CAPS Take 1,000 mg by mouth 2 (two) times daily.       . Saw Palmetto 450 MG CAPS Take 450 mg by mouth 2 (two) times daily.       Marland Kitchen Ubiquinol 100 MG CAPS Take 1 capsule by mouth daily.        . vitamin B-12 (CYANOCOBALAMIN) 500 MCG tablet Take 500 mcg by mouth daily.       No current facility-administered medications for this visit.    Allergies  Allergen Reactions  . Other     Antidepressants...has a variety of reactions to different antidepressants  . Prozac (Fluoxetine Hcl)     nighmares    History   Social History  . Marital Status: Married    Spouse Name: N/A     Number of Children: 2  . Years of Education: N/A   Occupational History  . Retired    Social History Main Topics  . Smoking status: Never Smoker   . Smokeless tobacco: Never Used  . Alcohol Use: No  . Drug Use: No  . Sexually Active: Not on file   Other Topics Concern  . Not on file   Social History Narrative   No caffeine     Family History  Problem Relation Age of Onset  . Alzheimer's disease Mother   . Lymphoma Father   . Coronary artery disease Brother   . COPD Brother   . Diabetes Brother   . Colon cancer Paternal Uncle   . Diabetes Brother     Review of Systems:  As stated in the HPI and otherwise negative.   BP 130/74  Pulse 58  Ht 5\' 6"  (1.676 m)  Wt 145 lb (65.772 kg)  BMI 23.41 kg/m2  Physical Examination: General: Well developed, well nourished, NAD HEENT: OP clear, mucus membranes moist SKIN: warm, dry. No rashes. Neuro: No focal deficits Musculoskeletal: Muscle strength 5/5 all ext Psychiatric: Mood and affect normal Neck: No JVD, no carotid bruits, no thyromegaly, no lymphadenopathy. Lungs:Clear bilaterally, no wheezes, rhonci, crackles Cardiovascular: Regular rate and rhythm. No murmurs, gallops or rubs. Abdomen:Soft. Bowel sounds present. Non-tender.  Extremities: No lower extremity edema. Pulses are 2 + in the bilateral DP/PT.  EKG: Sinus brady, rate 58 bpm. Old septal infarct.   Echo 11/13: Left ventricle: The cavity size was normal. Wall thickness was normal. Systolic function was mildly to moderately reduced. The estimated ejection fraction was in the range of 40% to 45%. There is akinesis of the apical myocardium. Doppler parameters are consistent with abnormal left ventricular relaxation (grade 1 diastolic dysfunction). - Pulmonary arteries: PA peak pressure: 36mm Hg (S).  Assessment and Plan:   1. CAD: His BP is well controlled. LDL is at goal on statin. Recent left sided chest pains, mostly at rest. This could be related to his  malignancy. He is known to have CAD with prior PCI. Will arrange exercise stress myoview to exclude ischemia and reassess LVEF.    2. PAD screening: no AAA on u/s 2013

## 2012-09-18 NOTE — Patient Instructions (Addendum)
Your physician recommends that you schedule a follow-up appointment in:  8 weeks.   Your physician has requested that you have an exercise stress myoview. For further information please visit [email or web address] Please follow instruction sheet, as given.

## 2012-09-22 ENCOUNTER — Ambulatory Visit (HOSPITAL_BASED_OUTPATIENT_CLINIC_OR_DEPARTMENT_OTHER): Payer: Medicare Other | Admitting: Oncology

## 2012-09-22 ENCOUNTER — Other Ambulatory Visit (HOSPITAL_BASED_OUTPATIENT_CLINIC_OR_DEPARTMENT_OTHER): Payer: Medicare Other | Admitting: Lab

## 2012-09-22 ENCOUNTER — Telehealth: Payer: Self-pay | Admitting: Oncology

## 2012-09-22 VITALS — BP 127/73 | HR 55 | Temp 97.1°F | Resp 18 | Ht 66.0 in | Wt 144.1 lb

## 2012-09-22 DIAGNOSIS — C8400 Mycosis fungoides, unspecified site: Secondary | ICD-10-CM

## 2012-09-22 DIAGNOSIS — D693 Immune thrombocytopenic purpura: Secondary | ICD-10-CM

## 2012-09-22 DIAGNOSIS — D696 Thrombocytopenia, unspecified: Secondary | ICD-10-CM

## 2012-09-22 LAB — MORPHOLOGY: RBC Comments: NORMAL

## 2012-09-22 LAB — COMPREHENSIVE METABOLIC PANEL (CC13)
ALT: 32 U/L (ref 0–55)
AST: 25 U/L (ref 5–34)
Albumin: 3.4 g/dL — ABNORMAL LOW (ref 3.5–5.0)
BUN: 16 mg/dL (ref 7.0–26.0)
CO2: 28 mEq/L (ref 22–29)
Calcium: 8.7 mg/dL (ref 8.4–10.4)
Chloride: 106 mEq/L (ref 98–107)
Potassium: 4.1 mEq/L (ref 3.5–5.1)

## 2012-09-22 LAB — CBC WITH DIFFERENTIAL/PLATELET
Basophils Absolute: 0 10*3/uL (ref 0.0–0.1)
Eosinophils Absolute: 0.1 10*3/uL (ref 0.0–0.5)
HGB: 15.5 g/dL (ref 13.0–17.1)
LYMPH%: 30.5 % (ref 14.0–49.0)
MCV: 99.1 fL — ABNORMAL HIGH (ref 79.3–98.0)
MONO#: 0.3 10*3/uL (ref 0.1–0.9)
MONO%: 6.4 % (ref 0.0–14.0)
NEUT#: 2.6 10*3/uL (ref 1.5–6.5)
Platelets: 105 10*3/uL — ABNORMAL LOW (ref 140–400)
RBC: 4.67 10*6/uL (ref 4.20–5.82)
WBC: 4.3 10*3/uL (ref 4.0–10.3)

## 2012-09-22 LAB — LACTATE DEHYDROGENASE (CC13): LDH: 168 U/L (ref 125–245)

## 2012-09-22 LAB — CHCC SMEAR

## 2012-09-22 NOTE — Progress Notes (Signed)
Hematology and Oncology Follow Up Visit  MARQUAL MI [id number] [birth year] 72 y.o. 09/22/2012 8:41 PM   Principle Diagnosis: Encounter Diagnoses  Name Primary?  . THROMBOCYTOPENIA Yes  . Mycosis fungoides, stage 1      Interim History:   Followup visit for this 72 year old male with chronic ITP. Evaluation of a localized, patchy, rash on his left pectoral area revealed a localized T-cell lymphoma. He had complete resolution of this area with the use of topical steroids. He had additional areas develop in the inguinal area which also resolved with steroids.  He has known coronary artery disease status post anterior MI January 2009 status post placement of a drug eluting stent in the LAD. He just had a followup visit with his cardiologist last week on May 8. He reported some atypical "spasms" in his chest intermittent over a few days. Pain lasted for just a few seconds it did not appear to be ischemic in nature.  Medications: reviewed  Allergies:  Allergies  Allergen Reactions  . Other     Antidepressants...has a variety of reactions to different antidepressants  . Prozac (Fluoxetine Hcl)     nighmares    Review of Systems: Constitutional:   No constitutional symptoms Respiratory: No cough or dyspnea Cardiovascular:  No ischemic type chest pain or pressure. Atypical chest spasms see above. Gastrointestinal: No change in bowel habit Genito-Urinary: No urinary tract symptoms Musculoskeletal: No muscle bone or joint pain Neurologic: No headache or change in vision Skin: No recurrent skin rash Remaining ROS negative.  Physical Exam: Blood pressure 127/73, pulse 55, temperature 97.1 F (36.2 C), temperature source Oral, resp. rate 18, height 5\' 6"  (1.676 m), weight 144 lb 1.6 oz (65.363 kg). Wt Readings from Last 3 Encounters:  09/22/12 144 lb 1.6 oz (65.363 kg)  09/18/12 145 lb (65.772 kg)  08/07/12 146 lb (66.225 kg)     General appearance: Thin Caucasian man HENNT:  Pharynx no erythema or exudate Lymph nodes: No adenopathy Breasts: Lungs: Clear to auscultation resonant to percussion Heart: Regular rhythm no murmur Abdomen: Soft, nontender, no mass, no organomegaly Extremities: No edema, no calf tenderness Musculoskeletal: GU: Vascular: No cyanosis Neurologic: No focal deficit Skin: No active rash or ecchymoses  Lab Results: Lab Results  Component Value Date   WBC 4.3 09/22/2012   HGB 15.5 09/22/2012   HCT 46.2 09/22/2012   MCV 99.1* 09/22/2012   PLT 105* 09/22/2012     Chemistry      Component Value Date/Time   NA 142 09/22/2012 1428   NA 142 11/22/2011 1324   NA 140 01/10/2011 1442   K 4.1 09/22/2012 1428   K 3.9 11/22/2011 1324   K 4.6 01/10/2011 1442   CL 106 09/22/2012 1428   CL 108 11/22/2011 1324   CL 102 01/10/2011 1442   CO2 28 09/22/2012 1428   CO2 25 11/22/2011 1324   CO2 26 01/10/2011 1442   BUN 16.0 09/22/2012 1428   BUN 18 11/22/2011 1324   BUN 19 01/10/2011 1442   CREATININE 1.1 09/22/2012 1428   CREATININE 0.96 11/22/2011 1324   CREATININE 1.0 01/10/2011 1442      Component Value Date/Time   CALCIUM 8.7 09/22/2012 1428   CALCIUM 8.9 11/22/2011 1324   CALCIUM 8.7 01/10/2011 1442   ALKPHOS 66 09/22/2012 1428   ALKPHOS 52 03/20/2012 0831   AST 25 09/22/2012 1428   AST 24 03/20/2012 0831   ALT 32 09/22/2012 1428   ALT 33 03/20/2012 0831  BILITOT 0.88 09/22/2012 1428   BILITOT 0.9 03/20/2012 0831       Radiological Studies: No results found.  Impression: #1. Chronic ITP with stable platelet count in the 100,000 range.  #2. Localized, stage I, mycosis fungoides complete resolution with topical steroid treatment. I believe this is totally unrelated to his atypical chest symptoms as reported to his cardiologist last week.  #3. Coronary artery disease status post MI status post stent  #4. Status post excision of a squamous cell carcinoma of the scalp  #5. Benign colon polyps    CC:. Dr. Eleonore Chiquito; Dr. Elmon Else; Dr.  Junita Push at [facility], [place] 5/12/20148:41 PM

## 2012-09-23 ENCOUNTER — Telehealth: Payer: Self-pay | Admitting: Cardiovascular Disease

## 2012-09-23 NOTE — Telephone Encounter (Signed)
Pt is aware to hold the coreg the evening of and the morning of the test as recommended per Nuclear med tech. Pt verbalized understanding.

## 2012-09-23 NOTE — Telephone Encounter (Signed)
New problem   Pt is having a nuc test and was told not to take Coreg tonight or tomorrow from [facility], but he said Dr Clifton James told his to skip in the morning and pt is confused about this. Please call pt

## 2012-09-24 ENCOUNTER — Ambulatory Visit (HOSPITAL_COMMUNITY): Payer: Medicare Other | Attending: Cardiovascular Disease | Admitting: Radiology

## 2012-09-24 VITALS — BP 119/70 | Ht 66.0 in | Wt 140.0 lb

## 2012-09-24 DIAGNOSIS — I10 Essential (primary) hypertension: Secondary | ICD-10-CM | POA: Insufficient documentation

## 2012-09-24 DIAGNOSIS — R5381 Other malaise: Secondary | ICD-10-CM | POA: Insufficient documentation

## 2012-09-24 DIAGNOSIS — R079 Chest pain, unspecified: Secondary | ICD-10-CM

## 2012-09-24 DIAGNOSIS — I428 Other cardiomyopathies: Secondary | ICD-10-CM | POA: Diagnosis not present

## 2012-09-24 DIAGNOSIS — I2510 Atherosclerotic heart disease of native coronary artery without angina pectoris: Secondary | ICD-10-CM | POA: Diagnosis not present

## 2012-09-24 DIAGNOSIS — R5383 Other fatigue: Secondary | ICD-10-CM | POA: Insufficient documentation

## 2012-09-24 DIAGNOSIS — I252 Old myocardial infarction: Secondary | ICD-10-CM | POA: Insufficient documentation

## 2012-09-24 DIAGNOSIS — E785 Hyperlipidemia, unspecified: Secondary | ICD-10-CM | POA: Diagnosis not present

## 2012-09-24 DIAGNOSIS — Z9861 Coronary angioplasty status: Secondary | ICD-10-CM | POA: Insufficient documentation

## 2012-09-24 MED ORDER — TECHNETIUM TC 99M SESTAMIBI GENERIC - CARDIOLITE
33.0000 | Freq: Once | INTRAVENOUS | Status: AC | PRN
  Administered 2012-09-24: 33 via INTRAVENOUS

## 2012-09-24 MED ORDER — TECHNETIUM TC 99M SESTAMIBI GENERIC - CARDIOLITE
11.0000 | Freq: Once | INTRAVENOUS | Status: AC | PRN
  Administered 2012-09-24: 11 via INTRAVENOUS

## 2012-09-24 NOTE — Progress Notes (Signed)
[facility] SITE 3 NUCLEAR MED [address] [place], [place] [address] [phone]    Cardiology Nuclear Med Study  [name] is a 72 y.o. male     MRN : [id number]     DOB: 29-Apr-1941  Procedure Date: 09/24/2012  Nuclear Med Background Indication for Stress Test:  Evaluation for Ischemia and PTCA/Stent Patency History:  Cardiomyopathy, '09 MI-AWMI/STEMI Heart Cath-Stent LAD CFX  50%, RCA 35-50% occluded, 11/13 ECHO: EF: 40-45% Akinesis of apical mild TR Cardiac Risk Factors: Hypertension and Lipids  Symptoms:  Chest Pain and Fatigue   Nuclear Pre-Procedure Caffeine/Decaff Intake:  None > 12 hrs NPO After: 7:30pm   Lungs:  clear O2 Sat: 95% on room air. IV 0.9% NS with Angio Cath:  20g  IV Site: R Antecubital x 1, tolerated well IV Started by:  Irean Hong, RN  Chest Size (in):  42 Cup Size: n/a  Height: 5\' 6"  (1.676 m)  Weight:  140 lb (63.504 kg)  BMI:  Body mass index is 22.61 kg/(m^2). Tech Comments:  Held coreg x 24 hrs; Took Cozaar this am    Nuclear Med Study 1 or 2 day study: 1 day  Stress Test Type:  Stress  Reading MD: Charlton Haws, MD  Order Authorizing Provider:  Verne Carrow, MD  Resting Radionuclide: Technetium 26m Sestamibi  Resting Radionuclide Dose: 11.0 mCi   Stress Radionuclide:  Technetium 24m Sestamibi  Stress Radionuclide Dose: 33.0 mCi           Stress Protocol Rest HR: 52 Stress HR: 146  Rest BP: 119/70 Stress BP: 164/87  Exercise Time (min): 10:15 METS: 12.10   Predicted Max HR: 147 bpm % Max HR: 99.32 bpm Rate Pressure Product: 41324   Dose of Adenosine (mg):  n/a Dose of Lexiscan: n/a mg  Dose of Atropine (mg): n/a Dose of Dobutamine: n/a mcg/kg/min (at max HR)  Stress Test Technologist: Milana Na, EMT-P  Nuclear Technologist:  Domenic Polite, CNMT     Rest Procedure:  Myocardial perfusion imaging was performed at rest 45 minutes following the intravenous administration of Technetium 25m Sestamibi. Rest  ECG: NSR old anterior MI  Stress Procedure:  The patient exercised on the treadmill utilizing the Bruce Protocol for 10:15  minutes. The patient stopped due to fatigue, and chest tightness 4/10.  This patient had rare pacs/pvcs with exercise.Technetium 74m Sestamibi was injected at peak exercise and myocardial perfusion imaging was performed after a brief delay. Stress ECG: No significant change from baseline ECG  QPS Raw Data Images:  Normal; no motion artifact; normal heart/lung ratio. Stress Images:  There is decreased uptake in the anterior wall. Rest Images:  There is decreased uptake in the anterior wall. Subtraction (SDS):  There is a fixed defect that is most consistent with a previous infarction. Transient Ischemic Dilatation (Normal <1.22):  0.92 Lung/Heart Ratio (Normal <0.45):  0.21  Quantitative Gated Spect Images QGS EDV:  93 ml QGS ESV:  42 ml  Impression Exercise Capacity:  Excellent exercise capacity. BP Response:  Normal blood pressure response. Clinical Symptoms:  Chest tightness ECG Impression:  No significant ST segment change suggestive of ischemia. Comparison with Prior Nuclear Study: No images to compare  Overall Impression:  Intermediate risk stress nuclear study Moderate mid and distal anterior wall, apical and inferoapical wall infarct with no ischemia.  LV Ejection Fraction: 55%.  LV Wall Motion:  EF reported as normal but clearly there is an anteroapical wall motion abnormality. Suggest MRI to  quantitate and assess scar burden  Charlton Haws

## 2012-09-25 ENCOUNTER — Telehealth: Payer: Self-pay | Admitting: Cardiovascular Disease

## 2012-09-25 NOTE — Telephone Encounter (Signed)
Spoke with pt and reviewed stress test results with him. He will come in to see Dr. Clifton James on May 21,2014 at 1:30

## 2012-09-25 NOTE — Telephone Encounter (Signed)
New problem    Pt states he's returning a call to someone possibly about test results

## 2012-10-01 ENCOUNTER — Other Ambulatory Visit: Payer: Self-pay | Admitting: *Deleted

## 2012-10-01 ENCOUNTER — Ambulatory Visit (INDEPENDENT_AMBULATORY_CARE_PROVIDER_SITE_OTHER): Payer: Medicare Other | Admitting: Cardiovascular Disease

## 2012-10-01 ENCOUNTER — Encounter: Payer: Self-pay | Admitting: *Deleted

## 2012-10-01 ENCOUNTER — Encounter: Payer: Self-pay | Admitting: Cardiovascular Disease

## 2012-10-01 VITALS — BP 122/70 | HR 50 | Ht 66.0 in | Wt 149.0 lb

## 2012-10-01 DIAGNOSIS — I2510 Atherosclerotic heart disease of native coronary artery without angina pectoris: Secondary | ICD-10-CM

## 2012-10-01 DIAGNOSIS — R079 Chest pain, unspecified: Secondary | ICD-10-CM

## 2012-10-01 DIAGNOSIS — R9439 Abnormal result of other cardiovascular function study: Secondary | ICD-10-CM | POA: Diagnosis not present

## 2012-10-01 LAB — CBC WITH DIFFERENTIAL/PLATELET
Basophils Relative: 0.5 % (ref 0.0–3.0)
Eosinophils Relative: 2.2 % (ref 0.0–5.0)
HCT: 44.5 % (ref 39.0–52.0)
Hemoglobin: 15.3 g/dL (ref 13.0–17.0)
Lymphs Abs: 1.5 10*3/uL (ref 0.7–4.0)
MCV: 97.9 fl (ref 78.0–100.0)
Monocytes Absolute: 0.3 10*3/uL (ref 0.1–1.0)
Monocytes Relative: 7.2 % (ref 3.0–12.0)
RBC: 4.55 Mil/uL (ref 4.22–5.81)
WBC: 4.7 10*3/uL (ref 4.5–10.5)

## 2012-10-01 LAB — PROTIME-INR: INR: 1.1 ratio — ABNORMAL HIGH (ref 0.8–1.0)

## 2012-10-01 LAB — BASIC METABOLIC PANEL
BUN: 21 mg/dL (ref 6–23)
CO2: 31 mEq/L (ref 19–32)
Chloride: 105 mEq/L (ref 96–112)
Creatinine, Ser: 0.9 mg/dL (ref 0.4–1.5)
Potassium: 6 mEq/L — ABNORMAL HIGH (ref 3.5–5.1)

## 2012-10-01 NOTE — Patient Instructions (Signed)
You have an appointment with Dr. Clifton James on November 06, 2012 at 10:45     Your physician has requested that you have a cardiac catheterization. Cardiac catheterization is used to diagnose and/or treat various heart conditions. Doctors may recommend this procedure for a number of different reasons. The most common reason is to evaluate chest pain. Chest pain can be a symptom of coronary artery disease (CAD), and cardiac catheterization can show whether plaque is narrowing or blocking your heart's arteries. This procedure is also used to evaluate the valves, as well as measure the blood flow and oxygen levels in different parts of your heart. For further information please visit [email or web address] Please follow instruction sheet, as given. Scheduled for Oct 08, 2012

## 2012-10-01 NOTE — Progress Notes (Signed)
History of Present Illness: 72 yo WM with history of CAD, HTN, hyperlipidemia, idiopathic thrombocytopenia and OSA here today for cardiac follow up. He has been followed in the past by Dr. Juanda Chance. I saw most recently Sep 18, 2012 and he was c/o spasms in his left chest wall. In January of 2009 he had an anterior MI treated with a drug-eluting stent to the LAD. His ejection fraction improved from 25% to 40-50%. No evidence of AAA on abdominal aortic u/s December 2013. He has been diagnosed with T-cell lymphoma. This was initially discovered on his left chest wall. He has had recurrence of his lymphoma. His regimen for lymphoma was restarted and he began to have fatigue. We arranged a stress myoview on 09/25/12. He exercised for over 10 minutes with no chest pain but his stress test suggested scar in the anterior wall, apex and inferoapical wall with LVEF of 55%. NO ischemic EKG changes.   He is here today for follow up. He continues to have c/o dizziness with some chest pains. No syncope.   Primary Care Physician: Evelena Peat  Last Lipid Profile:Lipid Panel     Component Value Date/Time   CHOL 105 03/20/2012 0831   TRIG 49.0 03/20/2012 0831   HDL 48.80 03/20/2012 0831   CHOLHDL 2 03/20/2012 0831   VLDL 9.8 03/20/2012 0831   LDLCALC 46 03/20/2012 0831     Past Medical History  Diagnosis Date  . Allergy   . CAD (coronary artery disease)   . Hypertension   . Hyperlipidemia   . Arthritis   . Diverticulosis of colon   . Benign prostatic hypertrophy   . Myocardial infarction 2009  . Thrombocytopenia   . Peripheral T cell lymphoma, unspecified site, extranodal and solid organ sites   . Anxiety   . History of colon polyps   . Depression   . History of kidney stones   . Cataract   . Clotting disorder     Past Surgical History  Procedure Laterality Date  . Vasectomy    . Tonsillectomy    . Lumbar laminectomy    . Ptca    . Cataract extraction      Bilateral   . Mohs surgery    .  Colonoscopy      Current Outpatient Prescriptions  Medication Sig Dispense Refill  . acetaminophen (TYLENOL) 650 MG CR tablet Take 650 mg by mouth every 8 (eight) hours as needed.        . ALPRAZolam (XANAX) 0.5 MG tablet Take 1 tablet (0.5 mg total) by mouth 2 (two) times daily as needed.  60 tablet  5  . aspirin 81 MG tablet Take 81 mg by mouth daily.        Marland Kitchen atorvastatin (LIPITOR) 20 MG tablet Take 1 tablet (20 mg total) by mouth daily.  90 tablet  3  . carvedilol (COREG) 3.125 MG tablet Take 1 tablet (3.125 mg total) by mouth 2 (two) times daily with a meal.  180 tablet  3  . Cholecalciferol (VITAMIN D3) 1000 UNITS tablet Take 1,000 Units by mouth 2 (two) times daily.       . clopidogrel (PLAVIX) 75 MG tablet Take 1 tablet (75 mg total) by mouth daily.  90 tablet  3  . doxycycline (VIBRA-TABS) 100 MG tablet Take 1 tablet (100 mg total) by mouth 2 (two) times daily. As needed  14 tablet  0  . losartan (COZAAR) 50 MG tablet Take 1 tablet (50 mg total) by  mouth daily.  90 tablet  3  . nitroGLYCERIN (NITROSTAT) 0.4 MG SL tablet Place 1 tablet (0.4 mg total) under the tongue every 5 (five) minutes as needed.  25 tablet  3  . Omega-3 Fatty Acids (FISH OIL) 1000 MG CAPS Take 1,000 mg by mouth 2 (two) times daily.       . Saw Palmetto 450 MG CAPS Take 450 mg by mouth 2 (two) times daily.       Marland Kitchen Ubiquinol 100 MG CAPS Take 1 capsule by mouth daily.        . vitamin B-12 (CYANOCOBALAMIN) 500 MCG tablet Take 500 mcg by mouth daily.       No current facility-administered medications for this visit.    Allergies  Allergen Reactions  . Other     Antidepressants...has a variety of reactions to different antidepressants  . Prozac (Fluoxetine Hcl)     nighmares    History   Social History  . Marital Status: Married    Spouse Name: N/A    Number of Children: 2  . Years of Education: N/A   Occupational History  . Retired    Social History Main Topics  . Smoking status: Never Smoker   .  Smokeless tobacco: Never Used  . Alcohol Use: No  . Drug Use: No  . Sexually Active: Not on file   Other Topics Concern  . Not on file   Social History Narrative   No caffeine     Family History  Problem Relation Age of Onset  . Alzheimer's disease Mother   . Lymphoma Father   . Coronary artery disease Brother   . COPD Brother   . Diabetes Brother   . Colon cancer Paternal Uncle   . Diabetes Brother     Review of Systems:  As stated in the HPI and otherwise negative.   BP 122/70  Pulse 50  Ht 5\' 6"  (1.676 m)  Wt 149 lb (67.586 kg)  BMI 24.06 kg/m2  Physical Examination: General: Well developed, well nourished, NAD HEENT: OP clear, mucus membranes moist SKIN: warm, dry. No rashes. Neuro: No focal deficits Musculoskeletal: Muscle strength 5/5 all ext Psychiatric: Mood and affect normal Neck: No JVD, no carotid bruits, no thyromegaly, no lymphadenopathy. Lungs:Clear bilaterally, no wheezes, rhonci, crackles Cardiovascular: Regular rate and rhythm. No murmurs, gallops or rubs. Abdomen:Soft. Bowel sounds present. Non-tender.  Extremities: No lower extremity edema. Pulses are 2 + in the bilateral DP/PT.  Stress myoview 09/25/12:  Rest Procedure: Myocardial perfusion imaging was performed at rest 45 minutes following the intravenous administration of Technetium 88m Sestamibi.  Rest ECG: NSR old anterior MI  Stress Procedure: The patient exercised on the treadmill utilizing the Bruce Protocol for 10:15 minutes. The patient stopped due to fatigue, and chest tightness 4/10. This patient had rare pacs/pvcs with exercise.Technetium 99m Sestamibi was injected at peak exercise and myocardial perfusion imaging was performed after a brief delay.  Stress ECG: No significant change from baseline ECG  QPS  Raw Data Images: Normal; no motion artifact; normal heart/lung ratio.  Stress Images: There is decreased uptake in the anterior wall.  Rest Images: There is decreased uptake in the  anterior wall.  Subtraction (SDS): There is a fixed defect that is most consistent with a previous infarction.  Transient Ischemic Dilatation (Normal <1.22): 0.92  Lung/Heart Ratio (Normal <0.45): 0.21  Quantitative Gated Spect Images  QGS EDV: 93 ml  QGS ESV: 42 ml  Impression  Exercise Capacity: Excellent exercise  capacity.  BP Response: Normal blood pressure response.  Clinical Symptoms: Chest tightness  ECG Impression: No significant ST segment change suggestive of ischemia.  Comparison with Prior Nuclear Study: No images to compare  Overall Impression: Intermediate risk stress nuclear study Moderate mid and distal anterior wall, apical and inferoapical wall infarct with no ischemia.  LV Ejection Fraction: 55%. LV Wall Motion: EF reported as normal but clearly there is an anteroapical wall motion abnormality. Suggest MRI to quantitate and assess scar burden   Assessment and Plan:   1. CAD: Stress test with abnormal uptake in the anterior wall, apex and inferoapical walls (intermediate risk). Recent chest spasms. This could represent scar from prior anterior MI but he is having dizziness/CP. Will arrange cardiac cath with possible PCI on 10/08/12 at 10am. R/B reviewed.

## 2012-10-02 ENCOUNTER — Encounter (HOSPITAL_COMMUNITY): Payer: Self-pay | Admitting: Pharmacy Technician

## 2012-10-02 ENCOUNTER — Other Ambulatory Visit (INDEPENDENT_AMBULATORY_CARE_PROVIDER_SITE_OTHER): Payer: Medicare Other

## 2012-10-02 DIAGNOSIS — I2510 Atherosclerotic heart disease of native coronary artery without angina pectoris: Secondary | ICD-10-CM | POA: Diagnosis not present

## 2012-10-02 LAB — BASIC METABOLIC PANEL
CO2: 29 mEq/L (ref 19–32)
Chloride: 104 mEq/L (ref 96–112)
Glucose, Bld: 77 mg/dL (ref 70–99)
Potassium: 4.6 mEq/L (ref 3.5–5.1)
Sodium: 140 mEq/L (ref 135–145)

## 2012-10-03 ENCOUNTER — Other Ambulatory Visit: Payer: Medicare Other

## 2012-10-03 ENCOUNTER — Telehealth: Payer: Self-pay | Admitting: Cardiovascular Disease

## 2012-10-03 ENCOUNTER — Encounter (HOSPITAL_COMMUNITY): Payer: Self-pay | Admitting: General Practice

## 2012-10-03 ENCOUNTER — Encounter (HOSPITAL_COMMUNITY)
Admission: RE | Disposition: A | Discharge status: Home / Self Care | Payer: Self-pay | Source: Ambulatory Visit | Attending: Cardiovascular Disease

## 2012-10-03 ENCOUNTER — Ambulatory Visit (HOSPITAL_COMMUNITY)
Admission: RE | Admit: 2012-10-03 | Discharge: 2012-10-04 | Disposition: A | Discharge status: Home / Self Care | Payer: Medicare Other | Source: Ambulatory Visit | Attending: Cardiovascular Disease | Admitting: Cardiovascular Disease

## 2012-10-03 ENCOUNTER — Ambulatory Visit (INDEPENDENT_AMBULATORY_CARE_PROVIDER_SITE_OTHER): Payer: Medicare Other | Admitting: Cardiology

## 2012-10-03 ENCOUNTER — Encounter: Payer: Self-pay | Admitting: Cardiology

## 2012-10-03 VITALS — BP 108/72 | HR 52 | Resp 16 | Wt 144.0 lb

## 2012-10-03 DIAGNOSIS — J309 Allergic rhinitis, unspecified: Secondary | ICD-10-CM

## 2012-10-03 DIAGNOSIS — N400 Enlarged prostate without lower urinary tract symptoms: Secondary | ICD-10-CM

## 2012-10-03 DIAGNOSIS — I2510 Atherosclerotic heart disease of native coronary artery without angina pectoris: Secondary | ICD-10-CM

## 2012-10-03 DIAGNOSIS — E785 Hyperlipidemia, unspecified: Secondary | ICD-10-CM | POA: Diagnosis not present

## 2012-10-03 DIAGNOSIS — D696 Thrombocytopenia, unspecified: Secondary | ICD-10-CM

## 2012-10-03 DIAGNOSIS — R0789 Other chest pain: Secondary | ICD-10-CM

## 2012-10-03 DIAGNOSIS — R5381 Other malaise: Secondary | ICD-10-CM | POA: Insufficient documentation

## 2012-10-03 DIAGNOSIS — J069 Acute upper respiratory infection, unspecified: Secondary | ICD-10-CM

## 2012-10-03 DIAGNOSIS — M1990 Unspecified osteoarthritis, unspecified site: Secondary | ICD-10-CM

## 2012-10-03 DIAGNOSIS — D693 Immune thrombocytopenic purpura: Secondary | ICD-10-CM

## 2012-10-03 DIAGNOSIS — I2589 Other forms of chronic ischemic heart disease: Secondary | ICD-10-CM

## 2012-10-03 DIAGNOSIS — F5104 Psychophysiologic insomnia: Secondary | ICD-10-CM

## 2012-10-03 DIAGNOSIS — I252 Old myocardial infarction: Secondary | ICD-10-CM | POA: Diagnosis not present

## 2012-10-03 DIAGNOSIS — K8020 Calculus of gallbladder without cholecystitis without obstruction: Secondary | ICD-10-CM

## 2012-10-03 DIAGNOSIS — C8400 Mycosis fungoides, unspecified site: Secondary | ICD-10-CM

## 2012-10-03 DIAGNOSIS — K5730 Diverticulosis of large intestine without perforation or abscess without bleeding: Secondary | ICD-10-CM

## 2012-10-03 DIAGNOSIS — N23 Unspecified renal colic: Secondary | ICD-10-CM

## 2012-10-03 DIAGNOSIS — G2589 Other specified extrapyramidal and movement disorders: Secondary | ICD-10-CM

## 2012-10-03 DIAGNOSIS — R079 Chest pain, unspecified: Secondary | ICD-10-CM | POA: Diagnosis not present

## 2012-10-03 DIAGNOSIS — I10 Essential (primary) hypertension: Secondary | ICD-10-CM | POA: Diagnosis not present

## 2012-10-03 DIAGNOSIS — R002 Palpitations: Secondary | ICD-10-CM

## 2012-10-03 DIAGNOSIS — I119 Hypertensive heart disease without heart failure: Secondary | ICD-10-CM

## 2012-10-03 DIAGNOSIS — Z87442 Personal history of urinary calculi: Secondary | ICD-10-CM

## 2012-10-03 DIAGNOSIS — G4733 Obstructive sleep apnea (adult) (pediatric): Secondary | ICD-10-CM | POA: Diagnosis not present

## 2012-10-03 HISTORY — DX: Basal cell carcinoma of skin of scalp and neck: C44.41

## 2012-10-03 HISTORY — DX: Non-Hodgkin lymphoma, unspecified, unspecified site: C85.90

## 2012-10-03 HISTORY — DX: Immune thrombocytopenic purpura: D69.3

## 2012-10-03 HISTORY — PX: CARDIAC CATHETERIZATION: SHX172

## 2012-10-03 HISTORY — DX: Calculus of kidney: N20.0

## 2012-10-03 HISTORY — PX: LEFT HEART CATHETERIZATION WITH CORONARY ANGIOGRAM: SHX5451

## 2012-10-03 HISTORY — DX: Other chronic pain: G89.29

## 2012-10-03 HISTORY — DX: Low back pain, unspecified: M54.50

## 2012-10-03 HISTORY — DX: Hereditary deficiency of other clotting factors: D68.2

## 2012-10-03 HISTORY — DX: Reserved for concepts with insufficient information to code with codable children: IMO0002

## 2012-10-03 HISTORY — DX: Personal history of other medical treatment: Z92.89

## 2012-10-03 HISTORY — DX: Low back pain: M54.5

## 2012-10-03 SURGERY — LEFT HEART CATHETERIZATION WITH CORONARY ANGIOGRAM
Anesthesia: LOCAL

## 2012-10-03 MED ORDER — LOSARTAN POTASSIUM 50 MG PO TABS
50.0000 mg | ORAL_TABLET | Freq: Every day | ORAL | Status: DC
  Administered 2012-10-04: 50 mg via ORAL
  Filled 2012-10-03 (×3): qty 1

## 2012-10-03 MED ORDER — SODIUM CHLORIDE 0.9 % IJ SOLN: 3.0000 mL | Freq: Two times a day (BID) | INTRAMUSCULAR | Status: DC

## 2012-10-03 MED ORDER — CLOPIDOGREL BISULFATE 75 MG PO TABS
75.0000 mg | ORAL_TABLET | Freq: Every day | ORAL | Status: DC
  Administered 2012-10-04: 09:00:00 75 mg via ORAL
  Filled 2012-10-03: qty 1

## 2012-10-03 MED ORDER — MIDAZOLAM HCL 2 MG/2ML IJ SOLN
INTRAMUSCULAR | Status: AC
  Filled 2012-10-03: qty 2

## 2012-10-03 MED ORDER — SODIUM CHLORIDE 0.9 % IV SOLN: INTRAVENOUS | Status: AC

## 2012-10-03 MED ORDER — FENTANYL CITRATE 0.05 MG/ML IJ SOLN
INTRAMUSCULAR | Status: AC
  Filled 2012-10-03: qty 2

## 2012-10-03 MED ORDER — ATORVASTATIN CALCIUM 20 MG PO TABS
20.0000 mg | ORAL_TABLET | Freq: Every day | ORAL | Status: DC
  Administered 2012-10-03: 20 mg via ORAL
  Filled 2012-10-03 (×3): qty 1

## 2012-10-03 MED ORDER — CARVEDILOL 3.125 MG PO TABS
3.1250 mg | ORAL_TABLET | Freq: Two times a day (BID) | ORAL | Status: DC
  Administered 2012-10-04: 09:00:00 3.125 mg via ORAL
  Filled 2012-10-03 (×2): qty 1

## 2012-10-03 MED ORDER — SODIUM CHLORIDE 0.9 % IJ SOLN: 3.0000 mL | INTRAMUSCULAR | Status: DC | PRN

## 2012-10-03 MED ORDER — LIDOCAINE HCL (PF) 1 % IJ SOLN
INTRAMUSCULAR | Status: AC
  Filled 2012-10-03: qty 30

## 2012-10-03 MED ORDER — ACETAMINOPHEN 325 MG PO TABS: 650.0000 mg | ORAL_TABLET | ORAL | Status: DC | PRN

## 2012-10-03 MED ORDER — HEPARIN (PORCINE) IN NACL 2-0.9 UNIT/ML-% IJ SOLN
INTRAMUSCULAR | Status: AC
  Filled 2012-10-03: qty 1000

## 2012-10-03 MED ORDER — ASPIRIN 81 MG PO CHEW
81.0000 mg | CHEWABLE_TABLET | Freq: Every day | ORAL | Status: DC
  Filled 2012-10-03: qty 1

## 2012-10-03 MED ORDER — SODIUM CHLORIDE 0.9 % IV SOLN: 250.0000 mL | INTRAVENOUS | Status: DC | PRN

## 2012-10-03 MED ORDER — DIAZEPAM 5 MG PO TABS
5.0000 mg | ORAL_TABLET | ORAL | Status: AC
  Administered 2012-10-03: 5 mg via ORAL
  Filled 2012-10-03: qty 1

## 2012-10-03 MED ORDER — ASPIRIN 81 MG PO CHEW
324.0000 mg | CHEWABLE_TABLET | ORAL | Status: AC
  Administered 2012-10-03: 324 mg via ORAL
  Filled 2012-10-03: qty 4

## 2012-10-03 MED ORDER — NITROGLYCERIN 0.4 MG SL SUBL: 0.4000 mg | SUBLINGUAL_TABLET | SUBLINGUAL | Status: DC | PRN

## 2012-10-03 MED ORDER — ONDANSETRON HCL 4 MG/2ML IJ SOLN: 4.0000 mg | Freq: Four times a day (QID) | INTRAMUSCULAR | Status: DC | PRN

## 2012-10-03 MED ORDER — ALPRAZOLAM 0.25 MG PO TABS: 0.5000 mg | ORAL_TABLET | Freq: Two times a day (BID) | ORAL | Status: DC | PRN

## 2012-10-03 MED ORDER — SODIUM CHLORIDE 0.9 % IV SOLN
INTRAVENOUS | Status: DC
  Administered 2012-10-03: 13:00:00 via INTRAVENOUS

## 2012-10-03 NOTE — Progress Notes (Signed)
Patient presents ambulatory in no acute distress from lobby; pt is alert & oriented x 4 and c/o continued dizziness and occasional chest pressure.  Patient is accompanied by his wife and states he is able to ambulate without assistance; skin warm, dry, acyanotic; patient denies nausea/vomiting.  Patient c/o chest pressure last night, none at present; he denies SOB.  Patient states he is scheduled for a cardiac cath next week due to recent results of a stress test that Dr. Clifton James feels should be addressed.  Patient states he is concerned today due to past hx of blockage in LAD.  Vital signs were obtained and a 12-lead EKG was performed.  Patient was discussed with Dr. Daleen Squibb, DOD who after assessing patient wants to schedule patient for cath today.  Arrangements were made through our liason, Bjorn Loser for patient to be admitted to short stay for hydration and cath will be done this afternoon.  Patient and wife are agreeable to plan and as patient is in no acute distress, his wife will drive him to hospital admissions.  I informed Bjorn Loser that pre-cath labs were done yesterday and that patient reports Factor VII blood disorder.  Patient was escorted to his car by [facility], LPN.    Patient's chest discomfort is concerning for angina. His dizziness is really vertigo and perhaps some orthostatic changes. He's very anxious and his wife is very concerned as well.  His physical examination is unremarkable and unchanged since his last office note. Laboratory data and stress Myoview reviewed.

## 2012-10-03 NOTE — H&P (Signed)
History of Present Illness: 72 yo WM with history of CAD, HTN, hyperlipidemia, idiopathic thrombocytopenia and OSA who has been seen in our office this week with c/o chest pain and dizziness. We arranged a stress myoview on 09/25/12. He exercised for over 10 minutes with no chest pain but his stress test suggested scar in the anterior wall, apex and inferoapical wall with LVEF of 55%. NO ischemic EKG changes. I saw him back 10/01/12 and arranged a cardiac cath next week. Today he came to the office with chest pain and dizziness and was admitted by Dr. Daleen Squibb for cath. He has been followed in the past by Dr. Juanda Chance. I saw him on Sep 18, 2012 and he was c/o spasms in his left chest wall. In January of 2009 he had an anterior MI treated with a drug-eluting stent to the LAD. His ejection fraction improved from 25% to 40-50%. No evidence of AAA on abdominal aortic u/s December 2013. He has been diagnosed with T-cell lymphoma. This was initially discovered on his left chest wall. He has had recurrence of his lymphoma.   Chest pain, dizziness and palpitations today. Resolved currently.   Primary Care Physician: Evelena Peat   Last Lipid Profile:Lipid Panel    Component  Value  Date/Time    CHOL  105  03/20/2012 0831    TRIG  49.0  03/20/2012 0831    HDL  48.80  03/20/2012 0831    CHOLHDL  2  03/20/2012 0831    VLDL  9.8  03/20/2012 0831    LDLCALC  46  03/20/2012 0831    Past Medical History   Diagnosis  Date   .  Allergy    .  CAD (coronary artery disease)    .  Hypertension    .  Hyperlipidemia    .  Arthritis    .  Diverticulosis of colon    .  Benign prostatic hypertrophy    .  Myocardial infarction  2009   .  Thrombocytopenia    .  Peripheral T cell lymphoma, unspecified site, extranodal and solid organ sites    .  Anxiety    .  History of colon polyps    .  Depression    .  History of kidney stones    .  Cataract    .  Clotting disorder     Past Surgical History   Procedure   Laterality  Date   .  Vasectomy     .  Tonsillectomy     .  Lumbar laminectomy     .  Ptca     .  Cataract extraction       Bilateral   .  Mohs surgery     .  Colonoscopy      Current Outpatient Prescriptions   Medication  Sig  Dispense  Refill   .  acetaminophen (TYLENOL) 650 MG CR tablet  Take 650 mg by mouth every 8 (eight) hours as needed.     .  ALPRAZolam (XANAX) 0.5 MG tablet  Take 1 tablet (0.5 mg total) by mouth 2 (two) times daily as needed.  60 tablet  5   .  aspirin 81 MG tablet  Take 81 mg by mouth daily.     Marland Kitchen  atorvastatin (LIPITOR) 20 MG tablet  Take 1 tablet (20 mg total) by mouth daily.  90 tablet  3   .  carvedilol (COREG) 3.125 MG tablet  Take 1 tablet (  3.125 mg total) by mouth 2 (two) times daily with a meal.  180 tablet  3   .  Cholecalciferol (VITAMIN D3) 1000 UNITS tablet  Take 1,000 Units by mouth 2 (two) times daily.     .  clopidogrel (PLAVIX) 75 MG tablet  Take 1 tablet (75 mg total) by mouth daily.  90 tablet  3   .  doxycycline (VIBRA-TABS) 100 MG tablet  Take 1 tablet (100 mg total) by mouth 2 (two) times daily. As needed  14 tablet  0   .  losartan (COZAAR) 50 MG tablet  Take 1 tablet (50 mg total) by mouth daily.  90 tablet  3   .  nitroGLYCERIN (NITROSTAT) 0.4 MG SL tablet  Place 1 tablet (0.4 mg total) under the tongue every 5 (five) minutes as needed.  25 tablet  3   .  Omega-3 Fatty Acids (FISH OIL) 1000 MG CAPS  Take 1,000 mg by mouth 2 (two) times daily.     .  Saw Palmetto 450 MG CAPS  Take 450 mg by mouth 2 (two) times daily.     Marland Kitchen  Ubiquinol 100 MG CAPS  Take 1 capsule by mouth daily.     .  vitamin B-12 (CYANOCOBALAMIN) 500 MCG tablet  Take 500 mcg by mouth daily.      No current facility-administered medications for this visit.    Allergies   Allergen  Reactions   .  Other      Antidepressants...has a variety of reactions to different antidepressants   .  Prozac (Fluoxetine Hcl)      nighmares    History    Social History   .  Marital  Status:  Married     Spouse Name:  N/A     Number of Children:  2   .  Years of Education:  N/A    Occupational History   .  Retired     Social History Main Topics   .  Smoking status:  Never Smoker   .  Smokeless tobacco:  Never Used   .  Alcohol Use:  No   .  Drug Use:  No   .  Sexually Active:  Not on file    Other Topics  Concern   .  Not on file    Social History Narrative    No caffeine    Family History   Problem  Relation  Age of Onset   .  Alzheimer's disease  Mother    .  Lymphoma  Father    .  Coronary artery disease  Brother    .  COPD  Brother    .  Diabetes  Brother    .  Colon cancer  Paternal Uncle    .  Diabetes  Brother     Review of Systems: As stated in the HPI and otherwise negative.   BP 115/65  Pulse 53  Ht 5\' 6"  (1.676 m)  Wt 149 lb (67.586 kg)  BMI 24.06 kg/m2  Physical Examination:  General: Well developed, well nourished, NAD  HEENT: OP clear, mucus membranes moist  SKIN: warm, dry. No rashes.  Neuro: No focal deficits  Musculoskeletal: Muscle strength 5/5 all ext  Psychiatric: Mood and affect normal  Neck: No JVD, no carotid bruits, no thyromegaly, no lymphadenopathy.  Lungs:Clear bilaterally, no wheezes, rhonci, crackles  Cardiovascular: Huston Foley, regular rhythm. No murmurs, gallops or rubs.  Abdomen:Soft. Bowel sounds present. Non-tender.  Extremities: No lower  extremity edema. Pulses are 2 + in the bilateral DP/PT.   Stress myoview 09/25/12:  Rest Procedure: Myocardial perfusion imaging was performed at rest 45 minutes following the intravenous administration of Technetium 10m Sestamibi.  Rest ECG: NSR old anterior MI  Stress Procedure: The patient exercised on the treadmill utilizing the Bruce Protocol for 10:15 minutes. The patient stopped due to fatigue, and chest tightness 4/10. This patient had rare pacs/pvcs with exercise.Technetium 30m Sestamibi was injected at peak exercise and myocardial perfusion imaging was performed after a  brief delay.  Stress ECG: No significant change from baseline ECG  QPS  Raw Data Images: Normal; no motion artifact; normal heart/lung ratio.  Stress Images: There is decreased uptake in the anterior wall.  Rest Images: There is decreased uptake in the anterior wall.  Subtraction (SDS): There is a fixed defect that is most consistent with a previous infarction.  Transient Ischemic Dilatation (Normal <1.22): 0.92  Lung/Heart Ratio (Normal <0.45): 0.21  Quantitative Gated Spect Images  QGS EDV: 93 ml  QGS ESV: 42 ml  Impression  Exercise Capacity: Excellent exercise capacity.  BP Response: Normal blood pressure response.  Clinical Symptoms: Chest tightness  ECG Impression: No significant ST segment change suggestive of ischemia.  Comparison with Prior Nuclear Study: No images to compare  Overall Impression: Intermediate risk stress nuclear study Moderate mid and distal anterior wall, apical and inferoapical wall infarct with no ischemia.  LV Ejection Fraction: 55%. LV Wall Motion: EF reported as normal but clearly there is an anteroapical wall motion abnormality. Suggest MRI to quantitate and assess scar burden   Assessment and Plan:   1. CAD/chest pain: Admitted with chest pain. Plans for cardiac cath.   2. Dizziness: ? Etiology. Can check carotid artery dopplers and arrange 48 hour monitor as an outpatient.   Kal Chait 8:04 PM 10/03/2012

## 2012-10-03 NOTE — Assessment & Plan Note (Signed)
I am concerned that he could have some angina last night. We'll admit to the Cath Lab with Dr. Sanjuana Kava. Patient wife are aware of indications risks, and benefits of cath. This was scheduled for next Tuesday but I do not want to wait for the weekend.

## 2012-10-03 NOTE — Interval H&P Note (Signed)
History and Physical Interval Note:  10/03/2012 6:44 PM  [name]  has presented today for cardiac cath with the diagnosis of Chest pain, known CAD.  The various methods of treatment have been discussed with the patient and family. After consideration of risks, benefits and other options for treatment, the patient has consented to  Procedure(s): LEFT HEART CATHETERIZATION WITH CORONARY ANGIOGRAM (N/A) as a surgical intervention .  The patient's history has been reviewed, patient examined, no change in status, stable for surgery.  I have reviewed the patient's chart and labs.  Questions were answered to the patient's satisfaction.     Fern Asmar

## 2012-10-03 NOTE — Telephone Encounter (Signed)
New problem     Cath procedure on next week.    C/O dizzy this am .@ 8:30 B/p this am 122/71. Pulse  52.    @ 15 min later  116/72 . Pulse 50.

## 2012-10-03 NOTE — CV Procedure (Signed)
Cardiac Catheterization Operative Report  [name] [id number] 5/23/20146:52 PM Kristian Covey, MD  Procedure Performed:  1. Left Heart Catheterization 2. Selective Coronary Angiography 3. Left ventricular angiogram  Operator: Verne Carrow, MD  Indication: 72 yo WM with history of CAD, HTN, hyperlipidemia, idiopathic thrombocytopenia and OSA who was admitted today from the office where he presented with c/o dizziness and chest pain. I saw him in the office 10/01/12 and planned cardiac cath next week.  He has been followed in the past by Dr. Juanda Chance. I saw him three weeks ago on Sep 18, 2012 and he was c/o spasms in his left chest wall. In January of 2009 he had an anterior MI treated with a drug-eluting stent to the LAD. His ejection fraction improved from 25% to 40-50%. No evidence of AAA on abdominal aortic u/s December 2013. He has been diagnosed with T-cell lymphoma. This was initially discovered on his left chest wall. He has had recurrence of his lymphoma. His regimen for lymphoma was restarted and he began to have fatigue. We arranged a stress myoview on 09/25/12. He exercised for over 10 minutes with no chest pain but his stress test suggested scar in the anterior wall, apex and inferoapical wall with LVEF of 55%. NO ischemic EKG changes. He has continued to have c/o dizziness with some chest pains. No syncope. Admitted by Dr. Daleen Squibb today for cardiac cath.                                        Procedure Details: The risks, benefits, complications, treatment options, and expected outcomes were discussed with the patient. The patient and/or family concurred with the proposed plan, giving informed consent. The patient was brought to the cath lab after IV hydration was begun and oral premedication was given. The patient was further sedated with Versed and Fentanyl. The right and left groins were prepped and draped in the usual manner. I was unable to pass a wire into the right  femoral artery. Using the modified Seldinger access technique, a 5 French sheath was placed in the left femoral artery. Standard diagnostic catheters were used to perform selective coronary angiography. A pigtail catheter was used to perform a left ventricular angiogram.  There were no immediate complications. The patient was taken to the recovery area in stable condition.   Hemodynamic Findings: Central aortic pressure: 107/53 Left ventricular pressure: 115/4/8  Angiographic Findings:  Left main:  No obstructive disease.   Left Anterior Descending Artery: Large caliber vessel that courses to the apex. The proximal vessel has mild plaque disease. The mid stented segment is patent. The small caliber diagonal branch is patent with ostial 70% stenosis as it is jailed by the stent and unchanged in appearance.   Circumflex Artery: Moderate caliber vessel with moderate caliber obtuse marginal branch with no significant disease. The AV groove Circumflex has a 40% stenosis just after the takeoff of the OM branch, unchanged.   Right Coronary Artery:  Large, dominant vessel with serial 20% lesions throughout the proximal vessel. The mid vessel has a 40% stenosis. The distal vessel has mild plaque disease.   Left Ventricular Angiogram: LVEF 55% with anterior and apical hypokinesis.   Impression: 1. Stable single vessel CAD 2. Segmental wall motion abnormality of the anterior wall after anterior MI in 2009, overall preserved LV function.  3. Non-cardiac chest pain.   Recommendations: No clear  etiology of his dizziness and chest pain. Would watch on telemetry overnight. Plan carotid dopplers and 48 hour holter monitor as an outpatient. D/C home in am in groin stable.        Complications:  None. The patient tolerated the procedure well.

## 2012-10-03 NOTE — H&P (View-Only) (Signed)
 History of Present Illness: 71 yo WM with history of CAD, HTN, hyperlipidemia, idiopathic thrombocytopenia and OSA here today for cardiac follow up. He has been followed in the past by Dr. Brodie. I saw most recently Sep 18, 2012 and he was c/o spasms in his left chest wall. In January of 2009 he had an anterior MI treated with a drug-eluting stent to the LAD. His ejection fraction improved from 25% to 40-50%. No evidence of AAA on abdominal aortic u/s December 2013. He has been diagnosed with T-cell lymphoma. This was initially discovered on his left chest wall. He has had recurrence of his lymphoma. His regimen for lymphoma was restarted and he began to have fatigue. We arranged a stress myoview on 09/25/12. He exercised for over 10 minutes with no chest pain but his stress test suggested scar in the anterior wall, apex and inferoapical wall with LVEF of 55%. NO ischemic EKG changes.   He is here today for follow up. He continues to have c/o dizziness with some chest pains. No syncope.   Primary Care Physician: Bruce Burchette  Last Lipid Profile:Lipid Panel     Component Value Date/Time   CHOL 105 03/20/2012 0831   TRIG 49.0 03/20/2012 0831   HDL 48.80 03/20/2012 0831   CHOLHDL 2 03/20/2012 0831   VLDL 9.8 03/20/2012 0831   LDLCALC 46 03/20/2012 0831     Past Medical History  Diagnosis Date  . Allergy   . CAD (coronary artery disease)   . Hypertension   . Hyperlipidemia   . Arthritis   . Diverticulosis of colon   . Benign prostatic hypertrophy   . Myocardial infarction 2009  . Thrombocytopenia   . Peripheral T cell lymphoma, unspecified site, extranodal and solid organ sites   . Anxiety   . History of colon polyps   . Depression   . History of kidney stones   . Cataract   . Clotting disorder     Past Surgical History  Procedure Laterality Date  . Vasectomy    . Tonsillectomy    . Lumbar laminectomy    . Ptca    . Cataract extraction      Bilateral   . Mohs surgery    .  Colonoscopy      Current Outpatient Prescriptions  Medication Sig Dispense Refill  . acetaminophen (TYLENOL) 650 MG CR tablet Take 650 mg by mouth every 8 (eight) hours as needed.        . ALPRAZolam (XANAX) 0.5 MG tablet Take 1 tablet (0.5 mg total) by mouth 2 (two) times daily as needed.  60 tablet  5  . aspirin 81 MG tablet Take 81 mg by mouth daily.        . atorvastatin (LIPITOR) 20 MG tablet Take 1 tablet (20 mg total) by mouth daily.  90 tablet  3  . carvedilol (COREG) 3.125 MG tablet Take 1 tablet (3.125 mg total) by mouth 2 (two) times daily with a meal.  180 tablet  3  . Cholecalciferol (VITAMIN D3) 1000 UNITS tablet Take 1,000 Units by mouth 2 (two) times daily.       . clopidogrel (PLAVIX) 75 MG tablet Take 1 tablet (75 mg total) by mouth daily.  90 tablet  3  . doxycycline (VIBRA-TABS) 100 MG tablet Take 1 tablet (100 mg total) by mouth 2 (two) times daily. As needed  14 tablet  0  . losartan (COZAAR) 50 MG tablet Take 1 tablet (50 mg total) by   mouth daily.  90 tablet  3  . nitroGLYCERIN (NITROSTAT) 0.4 MG SL tablet Place 1 tablet (0.4 mg total) under the tongue every 5 (five) minutes as needed.  25 tablet  3  . Omega-3 Fatty Acids (FISH OIL) 1000 MG CAPS Take 1,000 mg by mouth 2 (two) times daily.       . Saw Palmetto 450 MG CAPS Take 450 mg by mouth 2 (two) times daily.       . Ubiquinol 100 MG CAPS Take 1 capsule by mouth daily.        . vitamin B-12 (CYANOCOBALAMIN) 500 MCG tablet Take 500 mcg by mouth daily.       No current facility-administered medications for this visit.    Allergies  Allergen Reactions  . Other     Antidepressants...has a variety of reactions to different antidepressants  . Prozac (Fluoxetine Hcl)     nighmares    History   Social History  . Marital Status: Married    Spouse Name: N/A    Number of Children: 2  . Years of Education: N/A   Occupational History  . Retired    Social History Main Topics  . Smoking status: Never Smoker   .  Smokeless tobacco: Never Used  . Alcohol Use: No  . Drug Use: No  . Sexually Active: Not on file   Other Topics Concern  . Not on file   Social History Narrative   No caffeine     Family History  Problem Relation Age of Onset  . Alzheimer's disease Mother   . Lymphoma Father   . Coronary artery disease Brother   . COPD Brother   . Diabetes Brother   . Colon cancer Paternal Uncle   . Diabetes Brother     Review of Systems:  As stated in the HPI and otherwise negative.   BP 122/70  Pulse 50  Ht 5' 6" (1.676 m)  Wt 149 lb (67.586 kg)  BMI 24.06 kg/m2  Physical Examination: General: Well developed, well nourished, NAD HEENT: OP clear, mucus membranes moist SKIN: warm, dry. No rashes. Neuro: No focal deficits Musculoskeletal: Muscle strength 5/5 all ext Psychiatric: Mood and affect normal Neck: No JVD, no carotid bruits, no thyromegaly, no lymphadenopathy. Lungs:Clear bilaterally, no wheezes, rhonci, crackles Cardiovascular: Regular rate and rhythm. No murmurs, gallops or rubs. Abdomen:Soft. Bowel sounds present. Non-tender.  Extremities: No lower extremity edema. Pulses are 2 + in the bilateral DP/PT.  Stress myoview 09/25/12:  Rest Procedure: Myocardial perfusion imaging was performed at rest 45 minutes following the intravenous administration of Technetium 99m Sestamibi.  Rest ECG: NSR old anterior MI  Stress Procedure: The patient exercised on the treadmill utilizing the Bruce Protocol for 10:15 minutes. The patient stopped due to fatigue, and chest tightness 4/10. This patient had rare pacs/pvcs with exercise.Technetium 99m Sestamibi was injected at peak exercise and myocardial perfusion imaging was performed after a brief delay.  Stress ECG: No significant change from baseline ECG  QPS  Raw Data Images: Normal; no motion artifact; normal heart/lung ratio.  Stress Images: There is decreased uptake in the anterior wall.  Rest Images: There is decreased uptake in the  anterior wall.  Subtraction (SDS): There is a fixed defect that is most consistent with a previous infarction.  Transient Ischemic Dilatation (Normal <1.22): 0.92  Lung/Heart Ratio (Normal <0.45): 0.21  Quantitative Gated Spect Images  QGS EDV: 93 ml  QGS ESV: 42 ml  Impression  Exercise Capacity: Excellent exercise   capacity.  BP Response: Normal blood pressure response.  Clinical Symptoms: Chest tightness  ECG Impression: No significant ST segment change suggestive of ischemia.  Comparison with Prior Nuclear Study: No images to compare  Overall Impression: Intermediate risk stress nuclear study Moderate mid and distal anterior wall, apical and inferoapical wall infarct with no ischemia.  LV Ejection Fraction: 55%. LV Wall Motion: EF reported as normal but clearly there is an anteroapical wall motion abnormality. Suggest MRI to quantitate and assess scar burden   Assessment and Plan:   1. CAD: Stress test with abnormal uptake in the anterior wall, apex and inferoapical walls (intermediate risk). Recent chest spasms. This could represent scar from prior anterior MI but he is having dizziness/CP. Will arrange cardiac cath with possible PCI on 10/08/12 at 10am. R/B reviewed.     

## 2012-10-03 NOTE — Telephone Encounter (Signed)
Spoke with patient who c/o increased dizziness.  Patient states when he saw Dr. Clifton James on 5/21 that he was experiencing periods of feeling light-headed but today he is more dizzy.  He states he did not get out of bed too quickly.  Patient reports BP 122/71, HR 52 @ 0830 and 116/72, HR 50 @ 0845.  Patient states at times he feels his heart racing and/or fluttering but does not feel it currently; states he thinks his heart is beating normally now.  Patient denies nausea and current chest pain; c/o chest tightness before bed last night.  Patient denies SOB. I advised patient that we could bring him in for a nurse visit and do an EKG to determine what rhythm his heart is in presently.  Patient states his wife can drive him and does not feel that he needs to go to the hospital at this time.

## 2012-10-04 DIAGNOSIS — I2510 Atherosclerotic heart disease of native coronary artery without angina pectoris: Secondary | ICD-10-CM | POA: Diagnosis not present

## 2012-10-04 NOTE — Discharge Summary (Signed)
Physician Discharge Summary  Patient ID: [name] MRN: [id number] DOB/AGE: [birth year] 72 y.o.  Admit date: 10/03/2012 Discharge date: 10/04/2012  Primary Discharge Diagnosis 1. CAD 2. Chest Pain with abnormal stress test  Secondary Discharge Diagnosis: 1. Hypertension 2. Hyperlipidemia 3. Idiopathic thrombocytopenia 4. OSA  Significant Diagnostic Studies:  1. Cardiac Cath 10/03/2012 Angiographic Findings:  Left main: No obstructive disease.  Left Anterior Descending Artery: Large caliber vessel that courses to the apex. The proximal vessel has mild plaque disease. The mid stented segment is patent. The small caliber diagonal branch is patent with ostial 70% stenosis as it is jailed by the stent and unchanged in appearance.  Circumflex Artery: Moderate caliber vessel with moderate caliber obtuse marginal branch with no significant disease. The AV groove Circumflex has a 40% stenosis just after the takeoff of the OM branch, unchanged.  Right Coronary Artery: Large, dominant vessel with serial 20% lesions throughout the proximal vessel. The mid vessel has a 40% stenosis. The distal vessel has mild plaque disease.  Left Ventricular Angiogram: LVEF 55% with anterior and apical hypokinesis.  Impression:  1. Stable single vessel CAD  2. Segmental wall motion abnormality of the anterior wall after anterior MI in 2009, overall preserved LV function.  3. Non-cardiac chest pain.   Hospital Course: [name] is a 72 year old patient of Dr. Verne Carrow (formerly of Dr. Juanda Chance) admitted for cardiac catheterization in the setting of abnormal stress Myoview and recurrent chest pain. He was found to have a normal ejection fraction of 55% with no wall motion abnormalities. The patient was scheduled for cardiac catheterization for definitive evaluation of coronary anatomy. Cardiac catheterization revealed stable single vessel CAD in a small caliber diagonal branch which was patent with ostial 70%  stenosis as it is jailed  by a stent and unchanged in appearance. The patient will be seen as an outpatient and plan bilateral carotid Doppler studies and a 48 hour Holter monitor prior to followup appointment. The patient was seen and examined by Dr. Girard Bing on day of discharge and found to be stable. No medication changes were made.  Discharge Exam: Blood pressure 121/64, pulse 53, temperature 97.8 F (36.6 C), temperature source Oral, resp. rate 17, height 5\' 6"  (1.676 m), weight 138 lb 14.2 oz (63 kg), SpO2 99.00%.   General: Well developed, well nourished, in no acute distress Head: Eyes PERRLA, No xanthomas.   Normal cephalic and atramatic  Lungs: Clear bilaterally to auscultation and percussion. Heart: HRRR S1 S2, without MRG.  Pulses are 2+ & equal.            No carotid bruit. No JVD.  No abdominal bruits. No femoral bruits. Abdomen: Bowel sounds are positive, abdomen soft and non-tender without masses . Msk:  Back normal, normal gait. Normal strength and tone for age. Extremities: No clubbing, cyanosis or edema.  Bilateral femoral artery punctures-both sites benign Neuro: Alert and oriented X 3. Psych:  Good affect, responds appropriately   Lab Results  Component Value Date   WBC 4.7 10/01/2012   HGB 15.3 10/01/2012   HCT 44.5 10/01/2012   MCV 97.9 10/01/2012   PLT 100.0* 10/01/2012    Recent Labs Lab 10/02/12 0945  NA 140  K 4.6  CL 104  CO2 29  BUN 22  CREATININE 1.0  CALCIUM 9.1  GLUCOSE 77   EKG: Sinus rhythm with sinus bradycardia with evidence of anterior infarct.  FOLLOW UP PLANS AND APPOINTMENTS Discharge Orders   Future Appointments Provider Department Dept  Phone   11/06/2012 10:45 AM Kathleene Hazel, MD [facility] Main Office [place]) [phone]   03/23/2013 2:30 PM Mauri [facility] [phone]   03/23/2013 3:00 PM Levert Feinstein, MD [facility] MEDICAL ONCOLOGY  [phone]   Future Orders Complete By Expires     Diet - low sodium heart healthy  As directed     Increase activity slowly  As directed         Medication List    TAKE these medications       acetaminophen 650 MG CR tablet  Commonly known as:  TYLENOL  Take 650 mg by mouth every 8 (eight) hours as needed for pain.     ALPRAZolam 0.5 MG tablet  Commonly known as:  XANAX  Take 1 tablet (0.5 mg total) by mouth 2 (two) times daily as needed.     aspirin 81 MG tablet  Take 81 mg by mouth daily.     atorvastatin 20 MG tablet  Commonly known as:  LIPITOR  Take 1 tablet (20 mg total) by mouth daily.     carvedilol 3.125 MG tablet  Commonly known as:  COREG  Take 1 tablet (3.125 mg total) by mouth 2 (two) times daily with a meal.     cholecalciferol 1000 UNITS tablet  Commonly known as:  VITAMIN D  Take 1,000 Units by mouth 2 (two) times daily.     clopidogrel 75 MG tablet  Commonly known as:  PLAVIX  Take 1 tablet (75 mg total) by mouth daily.     doxycycline 100 MG tablet  Commonly known as:  VIBRA-TABS  Take 100 mg by mouth 2 (two) times daily as needed (Only takes [place] as needed[place]     Fish Oil 1000 MG Caps  Take 1,000 mg by mouth 2 (two) times daily.     losartan 50 MG tablet  Commonly known as:  COZAAR  Take 1 tablet (50 mg total) by mouth daily.     nitroGLYCERIN 0.4 MG SL tablet  Commonly known as:  NITROSTAT  Place 1 tablet (0.4 mg total) under the tongue every 5 (five) minutes as needed.     Saw Palmetto 450 MG Caps  Take 450 mg by mouth 2 (two) times daily.     vitamin B-12 500 MCG tablet  Commonly known as:  CYANOCOBALAMIN  Take 500 mcg by mouth daily.      ASK your doctor about these medications       Ubiquinol 100 MG Caps  Take 1 capsule by mouth daily.           Follow-up Information   Follow up with Verne Carrow, MD. (Our office will call you for appt.)    Contact information:   [address]  [address] [place] [address] [phone]      Time spent with patient to include physician time:30 minutes Joni Reining 10/04/2012, 10:12 AM  Cardiology Attending Patient interviewed and examined. Discussed with Joni Reining, NP.  Above note annotated and modified based upon my findings.  Symptoms described by patient are more suggestive of palpitations rather than chest discomfort. If these persist, Holter monitoring or event recording would be the next appropriate test. Coronary anatomy is stable with adequate revascularization, and neither history nor testing suggest the presence of myocardial ischemia. Current medical therapy appears optimal.  [place] Bing, MD 10/04/2012, 2:51 PM

## 2012-10-08 ENCOUNTER — Other Ambulatory Visit: Payer: Self-pay | Admitting: *Deleted

## 2012-10-08 DIAGNOSIS — R42 Dizziness and giddiness: Secondary | ICD-10-CM

## 2012-10-09 ENCOUNTER — Encounter: Payer: Self-pay | Admitting: *Deleted

## 2012-10-09 ENCOUNTER — Encounter (INDEPENDENT_AMBULATORY_CARE_PROVIDER_SITE_OTHER): Payer: Medicare Other

## 2012-10-09 ENCOUNTER — Other Ambulatory Visit: Payer: Self-pay | Admitting: *Deleted

## 2012-10-09 DIAGNOSIS — R42 Dizziness and giddiness: Secondary | ICD-10-CM

## 2012-10-09 DIAGNOSIS — I6529 Occlusion and stenosis of unspecified carotid artery: Secondary | ICD-10-CM

## 2012-10-09 NOTE — Progress Notes (Signed)
Patient ID: [name], male   DOB: [birth year], 72 y.o.   MRN: [id number] EVO 48 Hour Holter monitor applied to patient.

## 2012-10-23 NOTE — Telephone Encounter (Signed)
Spoke with [name] and reviewed monitor results with him. Initial plan was to hold Coreg but [name] reports he first noticed increase in palpitations when Coreg was held evening before and morning of stress test. [name] reports dizziness has improved. I reviewed with Dr. Clifton James and [name] should continue Coreg. I told [name] he should continue same medications. He has been referred to Dr. Johney Frame and appt has been set up for October 30, 2012 at 11:45.  [name] states he is able to be here for this appt. Appt previously scheduled with Dr. Clifton James for November 06, 2012 will be cancelled. Dr. Clifton James would like to see him back in 3 months. I scheduled this appt for January 28, 2013 at 2:00.

## 2012-10-23 NOTE — Telephone Encounter (Signed)
New Problem  Pt is wanting to know the results of heart monitor that he wore.  He said he turned it in on Monday morning.  He said that if he does not answer you may give his wife the information.

## 2012-10-30 ENCOUNTER — Encounter: Payer: Self-pay | Admitting: Internal Medicine

## 2012-10-30 ENCOUNTER — Ambulatory Visit (INDEPENDENT_AMBULATORY_CARE_PROVIDER_SITE_OTHER): Payer: Medicare Other | Admitting: Internal Medicine

## 2012-10-30 VITALS — BP 110/60 | HR 78 | Ht 66.0 in | Wt 143.4 lb

## 2012-10-30 DIAGNOSIS — I2510 Atherosclerotic heart disease of native coronary artery without angina pectoris: Secondary | ICD-10-CM | POA: Diagnosis not present

## 2012-10-30 DIAGNOSIS — I471 Supraventricular tachycardia: Secondary | ICD-10-CM

## 2012-10-30 DIAGNOSIS — I498 Other specified cardiac arrhythmias: Secondary | ICD-10-CM

## 2012-10-30 DIAGNOSIS — R001 Bradycardia, unspecified: Secondary | ICD-10-CM

## 2012-10-30 LAB — T4, FREE: Free T4: 0.9 ng/dL (ref 0.60–1.60)

## 2012-10-30 NOTE — Patient Instructions (Signed)
Have lab work today.  We will see you as needed.  Follow up with Dr Sanjuana Kava as scheduled.

## 2012-11-02 DIAGNOSIS — I471 Supraventricular tachycardia: Secondary | ICD-10-CM | POA: Insufficient documentation

## 2012-11-02 DIAGNOSIS — R001 Bradycardia, unspecified: Secondary | ICD-10-CM | POA: Insufficient documentation

## 2012-11-02 NOTE — Progress Notes (Signed)
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 male with a h/o CAD and T cell lymphoma who presents for EP consultation regarding his bradycardia.  He has chronic dizziness.  He recently underwent cath by Dr Clifton Bascom Biel which demonstrated stable CAD.  He had a 48 hour holter placed.  This demonstrated sinus rhythm with an average HR of 61 bpm.  HR range was 47-117 bpm.  His bradycardia was predominantly nocturnal and he appeared to have been asymptomatic with this.  He had several episodes of nonsustained short atrial tachycardia as well. Today, he describes his dizziness as vertiginous spinning.  He has occasional postural dizziness also.  His energy is preserved.   He denies shortness of breath, orthopnea, PND, lower extremity edema,  presyncope, syncope, or neurologic sequela. The patient is tolerating medications without difficulties and is otherwise without complaint today.   Past Medical History  Diagnosis Date  . Allergy   . CAD (coronary artery disease)   . Hyperlipidemia   . Diverticulosis of colon   . Benign prostatic hypertrophy   . Anxiety   . History of colon polyps   . Depression   . Kidney stones     "I've had them twice; last time was ~ 9 yr ago; both times passed on their own" (10/18/2012)  . Pneumonia     "I think I had as a child" (October 18, 2012)  . ITP (idiopathic thrombocytopenic purpura)   . Factor VII deficiency   . History of blood transfusion 1941  . Arthritis     "all over" (10-18-12)  . Chronic lower back pain     "from a herniated disc" (10-18-12)  . Myocardial infarction 2009  . T-cell lymphoma     "I'm in stage I; original site was on my left chest; had a place around my waist treated last summer; got a place on back of my left leg" (2012-10-18)  . Squamous carcinoma     "left side; top of head" (Oct 18, 2012)  . Basal cell carcinoma of head     "right side; top of head" (October 18, 2012)   Past  Surgical History  Procedure Laterality Date  . Vasectomy    . Cataract extraction w/ intraocular lens  implant, bilateral Bilateral 2000's  . Mohs surgery Bilateral 2011-2012    "top of my head" (10/18/2012)  . Colonoscopy    . Coronary angioplasty with stent placement  05/29/2007    "1" (10/18/12)  . Cardiac catheterization  10-18-12  . Tonsillectomy  ~ 1945  . Cyst excision  1980's?    "or fatty tumor; taken off my back" (2012/10/18)  . Lumbar epidural injection  ~ 2004    Current Outpatient Prescriptions  Medication Sig Dispense Refill  . acetaminophen (TYLENOL) 650 MG CR tablet Take 650 mg by mouth every 8 (eight) hours as needed for pain.       Marland Kitchen ALPRAZolam (XANAX) 0.5 MG tablet Take 1 tablet (0.5 mg total) by mouth 2 (two) times daily as needed.  60 tablet  5  . aspirin 81 MG tablet Take 81 mg by mouth daily.        Marland Kitchen atorvastatin (LIPITOR) 20 MG tablet Take 1 tablet (20 mg total) by mouth daily.  90 tablet  3  . carvedilol (COREG) 3.125 MG tablet Take 1 tablet (3.125 mg total) by mouth 2 (two) times daily with a meal.  180 tablet  3  . Cholecalciferol (VITAMIN D3)  1000 UNITS tablet Take 1,000 Units by mouth 2 (two) times daily.       . clopidogrel (PLAVIX) 75 MG tablet Take 1 tablet (75 mg total) by mouth daily.  90 tablet  3  . doxycycline (VIBRA-TABS) 100 MG tablet Take 100 mg by mouth 2 (two) times daily as needed (Only takes when he [place] to [place] as needed.).      Marland Kitchen losartan (COZAAR) 50 MG tablet Take 1 tablet (50 mg total) by mouth daily.  90 tablet  3  . Magnesium 250 MG TABS Take by mouth daily.      . nitroGLYCERIN (NITROSTAT) 0.4 MG SL tablet Place 1 tablet (0.4 mg total) under the tongue every 5 (five) minutes as needed.  25 tablet  3  . Omega-3 Fatty Acids (FISH OIL) 1000 MG CAPS Take 1,000 mg by mouth 2 (two) times daily.       . Saw Palmetto 450 MG CAPS Take 450 mg by mouth 2 (two) times daily.       Marland Kitchen Ubiquinol 100 MG CAPS Take 1 capsule by mouth daily.         . vitamin B-12 (CYANOCOBALAMIN) 500 MCG tablet Take 500 mcg by mouth daily.       No current facility-administered medications for this visit.    Allergies  Allergen Reactions  . Other     Antidepressants...has a variety of reactions to different antidepressants  . Prozac (Fluoxetine Hcl)     nighmares    History   Social History  . Marital Status: Married    Spouse Name: N/A    Number of Children: 2  . Years of Education: N/A   Occupational History  . Retired    Social History Main Topics  . Smoking status: Never Smoker   . Smokeless tobacco: Never Used  . Alcohol Use: No  . Drug Use: No  . Sexually Active: Not Currently   Other Topics Concern  . Not on file   Social History Narrative   No caffeine     Family History  Problem Relation Age of Onset  . Alzheimer's disease Mother   . Lymphoma Father   . Coronary artery disease Brother   . COPD Brother   . Diabetes Brother   . Colon cancer Paternal Uncle   . Diabetes Brother     ROS- All systems are reviewed and negative except as per the HPI above  Physical Exam: Filed Vitals:   10/30/12 1150  BP: 110/60  Pulse: 78  Height: 5\' 6"  (1.676 m)  Weight: 143 lb 6.4 oz (65.046 kg)    GEN- The patient is well appearing, alert and oriented x 3 today.   Head- normocephalic, atraumatic Eyes-  Sclera clear, conjunctiva pink Ears- hearing intact Oropharynx- clear Neck- supple, no JVP Lymph- no cervical lymphadenopathy Lungs- Clear to ausculation bilaterally, normal work of breathing Heart- Regular rate and rhythm, no murmurs, rubs or gallops, PMI not laterally displaced GI- soft, NT, ND, + BS Extremities- no clubbing, cyanosis, or edema MS- no significant deformity or atrophy Skin- no rash or lesion Psych- euthymic mood, full affect Neuro- strength and sensation are intact  Holter monitor is described above.  EKGs and cath notes as well as both Dr Daleen Squibb and Karie Schwalbe notes are reviewed  Assessment  and Plan:  1. Bradycardia Asymptomatic I believe and predominantly noctural.  Continue his beta blocker dosage as previously prescribed.  In the office today, he ambulates without difficulty and his heart  rate increases appropriately to 80 bpm.  He does not have an indication for pacing presently. No further EP workup planned  2. Nonsustained atrial tachycardia Continue his current beta blocker regimen  3. CAD Stable  Follow-up with Dr Clifton Janai Brannigan I will see as needed going forward

## 2012-11-06 ENCOUNTER — Ambulatory Visit: Payer: Medicare Other | Admitting: Cardiovascular Disease

## 2012-11-24 ENCOUNTER — Ambulatory Visit: Payer: Medicare Other | Admitting: Cardiovascular Disease

## 2012-12-15 DIAGNOSIS — Z9861 Coronary angioplasty status: Secondary | ICD-10-CM | POA: Diagnosis not present

## 2012-12-15 DIAGNOSIS — I2510 Atherosclerotic heart disease of native coronary artery without angina pectoris: Secondary | ICD-10-CM | POA: Diagnosis not present

## 2012-12-15 DIAGNOSIS — R9431 Abnormal electrocardiogram [ECG] [EKG]: Secondary | ICD-10-CM | POA: Diagnosis not present

## 2012-12-15 DIAGNOSIS — I2109 ST elevation (STEMI) myocardial infarction involving other coronary artery of anterior wall: Secondary | ICD-10-CM | POA: Diagnosis not present

## 2012-12-15 DIAGNOSIS — E782 Mixed hyperlipidemia: Secondary | ICD-10-CM | POA: Diagnosis not present

## 2012-12-24 DIAGNOSIS — D239 Other benign neoplasm of skin, unspecified: Secondary | ICD-10-CM | POA: Diagnosis not present

## 2012-12-24 DIAGNOSIS — Z85828 Personal history of other malignant neoplasm of skin: Secondary | ICD-10-CM | POA: Diagnosis not present

## 2012-12-24 DIAGNOSIS — B353 Tinea pedis: Secondary | ICD-10-CM | POA: Diagnosis not present

## 2012-12-24 DIAGNOSIS — L821 Other seborrheic keratosis: Secondary | ICD-10-CM | POA: Diagnosis not present

## 2012-12-24 DIAGNOSIS — C8409 Mycosis fungoides, extranodal and solid organ sites: Secondary | ICD-10-CM | POA: Diagnosis not present

## 2013-01-28 ENCOUNTER — Encounter: Payer: Self-pay | Admitting: Cardiovascular Disease

## 2013-01-28 ENCOUNTER — Ambulatory Visit (INDEPENDENT_AMBULATORY_CARE_PROVIDER_SITE_OTHER): Payer: Medicare Other | Admitting: Cardiovascular Disease

## 2013-01-28 VITALS — BP 120/68 | HR 65 | Resp 12 | Ht 66.0 in | Wt 145.0 lb

## 2013-01-28 DIAGNOSIS — E785 Hyperlipidemia, unspecified: Secondary | ICD-10-CM | POA: Diagnosis not present

## 2013-01-28 DIAGNOSIS — C8409 Mycosis fungoides, extranodal and solid organ sites: Secondary | ICD-10-CM | POA: Diagnosis not present

## 2013-01-28 DIAGNOSIS — I2510 Atherosclerotic heart disease of native coronary artery without angina pectoris: Secondary | ICD-10-CM | POA: Diagnosis not present

## 2013-01-28 DIAGNOSIS — I498 Other specified cardiac arrhythmias: Secondary | ICD-10-CM | POA: Diagnosis not present

## 2013-01-28 DIAGNOSIS — R001 Bradycardia, unspecified: Secondary | ICD-10-CM

## 2013-01-28 DIAGNOSIS — C8400 Mycosis fungoides, unspecified site: Secondary | ICD-10-CM

## 2013-01-28 MED ORDER — NITROGLYCERIN 0.4 MG SL SUBL: 0.4000 mg | SUBLINGUAL_TABLET | SUBLINGUAL | Status: DC | PRN

## 2013-01-28 MED ORDER — LOSARTAN POTASSIUM 50 MG PO TABS: 50.0000 mg | ORAL_TABLET | Freq: Every day | ORAL | Status: DC

## 2013-01-28 MED ORDER — CLOPIDOGREL BISULFATE 75 MG PO TABS: 75.0000 mg | ORAL_TABLET | Freq: Every day | ORAL | Status: DC

## 2013-01-28 MED ORDER — ATORVASTATIN CALCIUM 20 MG PO TABS: 20.0000 mg | ORAL_TABLET | Freq: Every day | ORAL | Status: DC

## 2013-01-28 MED ORDER — CARVEDILOL 3.125 MG PO TABS: 3.1250 mg | ORAL_TABLET | Freq: Two times a day (BID) | ORAL | Status: DC

## 2013-01-28 NOTE — Progress Notes (Signed)
History of Present Illness: 72 yo WM with history of CAD, HTN, hyperlipidemia, idiopathic thrombocytopenia and OSA here today for cardiac follow up. He has been followed in the past by Dr. Juanda Chance. In January of 2009 he had an anterior MI treated with a drug-eluting stent to the LAD. His ejection fraction improved from 25% to 40-50%. No evidence of AAA on abdominal aortic u/s December 2013. He has been diagnosed with T-cell lymphoma. This was initially discovered on his left chest wall. He has had recurrence of his lymphoma. His regimen for lymphoma was restarted and he began to have fatigue. We arranged a stress myoview on 09/25/12. He exercised for over 10 minutes with no chest pain but his stress test suggested scar in the anterior wall, apex and inferoapical wall with LVEF of 55%. NO ischemic EKG changes. Cardiac cath 2012-10-31 with mild to moderate CAD, no obstructive lesions.   He is here today for follow up. He has been feeling well.   Primary Care Physician: Evelena Peat  Last Lipid Profile:Lipid Panel     Component Value Date/Time   CHOL 105 03/20/2012 0831   TRIG 49.0 03/20/2012 0831   HDL 48.80 03/20/2012 0831   CHOLHDL 2 03/20/2012 0831   VLDL 9.8 03/20/2012 0831   LDLCALC 46 03/20/2012 0831     Past Medical History  Diagnosis Date  . Allergy   . CAD (coronary artery disease)   . Hyperlipidemia   . Diverticulosis of colon   . Benign prostatic hypertrophy   . Anxiety   . History of colon polyps   . Depression   . Kidney stones     "I've had them twice; last time was ~ 9 yr ago; both times passed on their own" (10/31/12)  . Pneumonia     "I think I had as a child" (10/31/2012)  . ITP (idiopathic thrombocytopenic purpura)   . Factor VII deficiency   . History of blood transfusion 1941  . Arthritis     "all over" (10/31/12)  . Chronic lower back pain     "from a herniated disc" (10/31/12)  . Myocardial infarction 2009  . T-cell lymphoma     "I'm in stage I; original  site was on my left chest; had a place around my waist treated last summer; got a place on back of my left leg" (October 31, 2012)  . Squamous carcinoma     "left side; top of head" (10/31/2012)  . Basal cell carcinoma of head     "right side; top of head" (31-Oct-2012)    Past Surgical History  Procedure Laterality Date  . Vasectomy    . Cataract extraction w/ intraocular lens  implant, bilateral Bilateral 2000's  . Mohs surgery Bilateral 2011-2012    "top of my head" (2012/10/31)  . Colonoscopy    . Coronary angioplasty with stent placement  05/29/2007    "1" (10/31/12)  . Cardiac catheterization  10/31/12  . Tonsillectomy  ~ 1945  . Cyst excision  1980's?    "or fatty tumor; taken off my back" (October 31, 2012)  . Lumbar epidural injection  ~ 2004    Current Outpatient Prescriptions  Medication Sig Dispense Refill  . acetaminophen (TYLENOL) 650 MG CR tablet Take 650 mg by mouth every 8 (eight) hours as needed for pain.       Marland Kitchen ALPRAZolam (XANAX) 0.5 MG tablet Take 1 tablet (0.5 mg total) by mouth 2 (two) times daily as needed.  60 tablet  5  . aspirin 81  MG tablet Take 81 mg by mouth daily.        Marland Kitchen atorvastatin (LIPITOR) 20 MG tablet Take 1 tablet (20 mg total) by mouth daily.  90 tablet  3  . carvedilol (COREG) 3.125 MG tablet Take 1 tablet (3.125 mg total) by mouth 2 (two) times daily with a meal.  180 tablet  3  . Cholecalciferol (VITAMIN D3) 1000 UNITS tablet Take 1,000 Units by mouth 2 (two) times daily.       . clopidogrel (PLAVIX) 75 MG tablet Take 1 tablet (75 mg total) by mouth daily.  90 tablet  3  . doxycycline (VIBRA-TABS) 100 MG tablet Take 100 mg by mouth 2 (two) times daily as needed (Only takes when he travels to the mountains as needed.).      Marland Kitchen losartan (COZAAR) 50 MG tablet Take 1 tablet (50 mg total) by mouth daily.  90 tablet  3  . Magnesium 250 MG TABS Take by mouth daily.      . nitroGLYCERIN (NITROSTAT) 0.4 MG SL tablet Place 1 tablet (0.4 mg total) under the tongue  every 5 (five) minutes as needed.  25 tablet  3  . Omega-3 Fatty Acids (FISH OIL) 1000 MG CAPS Take 1,000 mg by mouth 2 (two) times daily.       . Saw Palmetto 450 MG CAPS Take 450 mg by mouth 2 (two) times daily.       Marland Kitchen Ubiquinol 100 MG CAPS Take 1 capsule by mouth daily.        . vitamin B-12 (CYANOCOBALAMIN) 500 MCG tablet Take 500 mcg by mouth daily.       No current facility-administered medications for this visit.    Allergies  Allergen Reactions  . Other     Antidepressants...has a variety of reactions to different antidepressants  . Prozac [Fluoxetine Hcl]     nighmares    History   Social History  . Marital Status: Married    Spouse Name: N/A    Number of Children: 2  . Years of Education: N/A   Occupational History  . Retired    Social History Main Topics  . Smoking status: Never Smoker   . Smokeless tobacco: Never Used  . Alcohol Use: No  . Drug Use: No  . Sexual Activity: Not Currently   Other Topics Concern  . Not on file   Social History Narrative   No caffeine     Family History  Problem Relation Age of Onset  . Alzheimer's disease Mother   . Lymphoma Father   . Coronary artery disease Brother   . COPD Brother   . Diabetes Brother   . Colon cancer Paternal Uncle   . Diabetes Brother     Review of Systems:  As stated in the HPI and otherwise negative.   BP 120/68  Pulse 65  Resp 12  Ht 5\' 6"  (1.676 m)  Wt 145 lb (65.772 kg)  BMI 23.41 kg/m2  Physical Examination: General: Well developed, well nourished, NAD HEENT: OP clear, mucus membranes moist SKIN: warm, dry. No rashes. Neuro: No focal deficits Musculoskeletal: Muscle strength 5/5 all ext Psychiatric: Mood and affect normal Neck: No JVD, no carotid bruits, no thyromegaly, no lymphadenopathy. Lungs:Clear bilaterally, no wheezes, rhonci, crackles Cardiovascular: Regular rate and rhythm. No murmurs, gallops or rubs. Abdomen:Soft. Bowel sounds present. Non-tender.  Extremities: No  lower extremity edema. Pulses are 2 + in the bilateral DP/PT.  Stress myoview 09/25/12:  Rest Procedure: Myocardial perfusion  imaging was performed at rest 45 minutes following the intravenous administration of Technetium 13m Sestamibi.  Rest ECG: NSR old anterior MI  Stress Procedure: The patient exercised on the treadmill utilizing the Bruce Protocol for 10:15 minutes. The patient stopped due to fatigue, and chest tightness 4/10. This patient had rare pacs/pvcs with exercise.Technetium 68m Sestamibi was injected at peak exercise and myocardial perfusion imaging was performed after a brief delay.  Stress ECG: No significant change from baseline ECG  QPS  Raw Data Images: Normal; no motion artifact; normal heart/lung ratio.  Stress Images: There is decreased uptake in the anterior wall.  Rest Images: There is decreased uptake in the anterior wall.  Subtraction (SDS): There is a fixed defect that is most consistent with a previous infarction.  Transient Ischemic Dilatation (Normal <1.22): 0.92  Lung/Heart Ratio (Normal <0.45): 0.21  Quantitative Gated Spect Images  QGS EDV: 93 ml  QGS ESV: 42 ml  Impression  Exercise Capacity: Excellent exercise capacity.  BP Response: Normal blood pressure response.  Clinical Symptoms: Chest tightness  ECG Impression: No significant ST segment change suggestive of ischemia.  Comparison with Prior Nuclear Study: No images to compare  Overall Impression: Intermediate risk stress nuclear study Moderate mid and distal anterior wall, apical and inferoapical wall infarct with no ischemia.  LV Ejection Fraction: 55%. LV Wall Motion: EF reported as normal but clearly there is an anteroapical wall motion abnormality. Suggest MRI to quantitate and assess scar burden   Cardiac cath may 2014: Left main: No obstructive disease.  Left Anterior Descending Artery: Large caliber vessel that courses to the apex. The proximal vessel has mild plaque disease. The mid stented  segment is patent. The small caliber diagonal branch is patent with ostial 70% stenosis as it is jailed by the stent and unchanged in appearance.  Circumflex Artery: Moderate caliber vessel with moderate caliber obtuse marginal branch with no significant disease. The AV groove Circumflex has a 40% stenosis just after the takeoff of the OM branch, unchanged.  Right Coronary Artery: Large, dominant vessel with serial 20% lesions throughout the proximal vessel. The mid vessel has a 40% stenosis. The distal vessel has mild plaque disease.  Left Ventricular Angiogram: LVEF 55% with anterior and apical hypokinesis.   Assessment and Plan:   1. CAD: Stable. Continue current meds. His medications are ASA, Plavix, statin, beta blocker, Ace-inh.   2. Bradycardia: He is asymptomatic. HR is 65 today. No indication for pacemaker.

## 2013-01-28 NOTE — Patient Instructions (Addendum)
Your physician wants you to follow-up in:  12 months.  You will receive a reminder letter in the mail two months in advance. If you don't receive a letter, please call our office to schedule the follow-up appointment.   

## 2013-02-04 DIAGNOSIS — Z23 Encounter for immunization: Secondary | ICD-10-CM | POA: Diagnosis not present

## 2013-03-09 DIAGNOSIS — H01009 Unspecified blepharitis unspecified eye, unspecified eyelid: Secondary | ICD-10-CM | POA: Diagnosis not present

## 2013-03-09 DIAGNOSIS — H40019 Open angle with borderline findings, low risk, unspecified eye: Secondary | ICD-10-CM | POA: Diagnosis not present

## 2013-03-09 DIAGNOSIS — Z961 Presence of intraocular lens: Secondary | ICD-10-CM | POA: Diagnosis not present

## 2013-03-09 DIAGNOSIS — H35379 Puckering of macula, unspecified eye: Secondary | ICD-10-CM | POA: Diagnosis not present

## 2013-03-19 ENCOUNTER — Other Ambulatory Visit: Payer: Self-pay

## 2013-03-23 ENCOUNTER — Other Ambulatory Visit (HOSPITAL_BASED_OUTPATIENT_CLINIC_OR_DEPARTMENT_OTHER): Payer: Medicare Other | Admitting: Lab

## 2013-03-23 ENCOUNTER — Ambulatory Visit (HOSPITAL_BASED_OUTPATIENT_CLINIC_OR_DEPARTMENT_OTHER): Payer: Medicare Other | Admitting: Oncology

## 2013-03-23 VITALS — BP 122/68 | HR 62 | Temp 96.8°F | Resp 18 | Ht 66.0 in | Wt 143.9 lb

## 2013-03-23 DIAGNOSIS — D693 Immune thrombocytopenic purpura: Secondary | ICD-10-CM | POA: Diagnosis not present

## 2013-03-23 DIAGNOSIS — C8400 Mycosis fungoides, unspecified site: Secondary | ICD-10-CM

## 2013-03-23 DIAGNOSIS — Z86010 Personal history of colonic polyps: Secondary | ICD-10-CM

## 2013-03-23 DIAGNOSIS — C8409 Mycosis fungoides, extranodal and solid organ sites: Secondary | ICD-10-CM

## 2013-03-23 DIAGNOSIS — D696 Thrombocytopenia, unspecified: Secondary | ICD-10-CM

## 2013-03-23 LAB — CBC WITH DIFFERENTIAL/PLATELET
Eosinophils Absolute: 0.1 10*3/uL (ref 0.0–0.5)
LYMPH%: 22.1 % (ref 14.0–49.0)
MONO#: 0.4 10*3/uL (ref 0.1–0.9)
NEUT#: 3.6 10*3/uL (ref 1.5–6.5)
Platelets: 108 10*3/uL — ABNORMAL LOW (ref 140–400)
RBC: 4.54 10*6/uL (ref 4.20–5.82)
RDW: 12.7 % (ref 11.0–14.6)
WBC: 5.2 10*3/uL (ref 4.0–10.3)
lymph#: 1.1 10*3/uL (ref 0.9–3.3)

## 2013-03-23 LAB — COMPREHENSIVE METABOLIC PANEL (CC13)
Alkaline Phosphatase: 62 U/L (ref 40–150)
Anion Gap: 7 mEq/L (ref 3–11)
BUN: 18.1 mg/dL (ref 7.0–26.0)
CO2: 28 mEq/L (ref 22–29)
Creatinine: 0.8 mg/dL (ref 0.7–1.3)
Glucose: 91 mg/dl (ref 70–140)
Total Bilirubin: 0.86 mg/dL (ref 0.20–1.20)

## 2013-03-23 LAB — MORPHOLOGY: PLT EST: DECREASED

## 2013-03-23 LAB — SEDIMENTATION RATE: Sed Rate: 1 mm/hr (ref 0–16)

## 2013-03-24 NOTE — Progress Notes (Signed)
Hematology and Oncology Follow Up Visit  [name] [id number] 09/26/1940 72 y.o. 03/24/2013 4:18 PM   Principle Diagnosis: Encounter Diagnoses  Name Primary?  . THROMBOCYTOPENIA Yes  . Mycosis fungoides, stage 1      Interim History:   Followup visit for this 72 year old male with chronic ITP. Evaluation of a localized, patchy, rash on his left pectoral area revealed a localized T-cell lymphoma. He had complete resolution of this area with the use of topical steroids. He had additional areas develop in the inguinal area which also resolved with steroids. CT scans did not show any lymphadenopathy or splenomegaly (01/10/2011)  He has known coronary artery disease status post anterior MI January 2009 status post placement of a drug eluting stent in the LAD. He just had a followup visit with his cardiologist last week on May 8. At the time of his visit here in May, he reported some atypical "spasms" in his chest intermittent over a few days. Pain lasted for just a few seconds it did not appear to be ischemic in nature. He did seek further evaluation with his cardiologist. An exercise stress test was done. He developed chest tightness and had rare PACs and PVCs with exercise. No significant ST segment changes on cardiogram. Test interpreted as intermediate risk with moderate mid and distal anterior wall, apical and infero-apical and changes consistent with previous MI. Normal ejection fraction. He subsequently underwent a cardiac MRI and a cardiac catheterization on May 23. Findings of mild to moderate coronary artery disease but no obstructive lesions. No changes were made in his medications. He has not had anymore symptoms since that time.   Medications: reviewed  Allergies:  Allergies  Allergen Reactions  . Other     Antidepressants...has a variety of reactions to different antidepressants  . Prozac [Fluoxetine Hcl]     nighmares    Review of Systems: Hematology: See above ENT ROS:  No sore throat Breast ROS:  Respiratory ROS: No cough or dyspnea Cardiovascular ROS:   See above Gastrointestinal ROS:   No abdominal pain or change in bowel habit Genito-Urinary ROS: No urinary tract symptoms Musculoskeletal ROS: No muscle bone or joint pain Neurological ROS: No headache or change in vision Dermatological ROS: No new skin lesions Remaining ROS negative.  Physical Exam: Blood pressure 122/68, pulse 62, temperature 96.8 F (36 C), temperature source Oral, resp. rate 18, height 5\' 6"  (1.676 m), weight 143 lb 14.4 oz (65.273 kg). Wt Readings from Last 3 Encounters:  03/23/13 143 lb 14.4 oz (65.273 kg)  01/28/13 145 lb (65.772 kg)  10/30/12 143 lb 6.4 oz (65.046 kg)     General appearance: Thin Caucasian man HENNT: Pharynx no erythema, exudate, mass, or ulcer. No thyromegaly or thyroid nodules Lymph nodes: No cervical, supraclavicular, or axillary lymphadenopathy Breasts: Lungs: Clear to auscultation, resonant to percussion throughout Heart: Regular rhythm, no murmur, no gallop, no rub, no click, no edema Abdomen: Soft, nontender, normal bowel sounds, no mass, no organomegaly Extremities: No edema, no calf tenderness Musculoskeletal: no joint deformities GU Vascular: Carotid pulses 2+, no bruits,  Neurologic: Alert, oriented, PERRLA, , cranial nerves grossly normal, motor strength 5 over 5, reflexes 1+ symmetric, upper body coordination normal, gait normal, Skin: No rash or ecchymosis. No recurrent lesions on the left chest wall.  Lab Results: CBC W/Diff  White count differential: 69% neutrophils, 22% lymphocytes, 7% monocytes   Component Value Date/Time   WBC 5.2 03/23/2013 1424   WBC 4.7 10/01/2012 1415   RBC  4.54 03/23/2013 1424   RBC 4.55 10/01/2012 1415   HGB 14.6 03/23/2013 1424   HGB 15.3 10/01/2012 1415   HCT 45.4 03/23/2013 1424   HCT 44.5 10/01/2012 1415   PLT 108* 03/23/2013 1424   PLT 100.0* 10/01/2012 1415   MCV 100.0* 03/23/2013 1424   MCV 97.9  10/01/2012 1415   MCH 32.3 03/23/2013 1424   MCH 33.7* 06/07/2009 1359   MCHC 32.3 03/23/2013 1424   MCHC 34.3 10/01/2012 1415   RDW 12.7 03/23/2013 1424   RDW 12.8 10/01/2012 1415   LYMPHSABS 1.1 03/23/2013 1424   LYMPHSABS 1.5 10/01/2012 1415   MONOABS 0.4 03/23/2013 1424   MONOABS 0.3 10/01/2012 1415   EOSABS 0.1 03/23/2013 1424   EOSABS 0.1 10/01/2012 1415   BASOSABS 0.0 03/23/2013 1424   BASOSABS 0.0 10/01/2012 1415     Chemistry      Component Value Date/Time   NA 142 03/23/2013 1424   NA 140 10/02/2012 0945   NA 140 01/10/2011 1442   K 4.7 03/23/2013 1424   K 4.6 10/02/2012 0945   K 4.6 01/10/2011 1442   CL 104 10/02/2012 0945   CL 106 09/22/2012 1428   CL 102 01/10/2011 1442   CO2 28 03/23/2013 1424   CO2 29 10/02/2012 0945   CO2 26 01/10/2011 1442   BUN 18.1 03/23/2013 1424   BUN 22 10/02/2012 0945   BUN 19 01/10/2011 1442   CREATININE 0.8 03/23/2013 1424   CREATININE 1.0 10/02/2012 0945   CREATININE 1.0 01/10/2011 1442      Component Value Date/Time   CALCIUM 9.4 03/23/2013 1424   CALCIUM 9.1 10/02/2012 0945   CALCIUM 8.7 01/10/2011 1442   ALKPHOS 62 03/23/2013 1424   ALKPHOS 52 03/20/2012 0831   AST 25 03/23/2013 1424   AST 24 03/20/2012 0831   ALT 27 03/23/2013 1424   ALT 33 03/20/2012 0831   BILITOT 0.86 03/23/2013 1424   BILITOT 0.9 03/20/2012 0831    Cardiac cath may 2014:  Left main: No obstructive disease.  Left Anterior Descending Artery: Large caliber vessel that courses to the apex. The proximal vessel has mild plaque disease. The mid stented segment is patent. The small caliber diagonal branch is patent with ostial 70% stenosis as it is jailed by the stent and unchanged in appearance.  Circumflex Artery: Moderate caliber vessel with moderate caliber obtuse marginal branch with no significant disease. The AV groove Circumflex has a 40% stenosis just after the takeoff of the OM branch, unchanged.  Right Coronary Artery: Large, dominant vessel with serial 20% lesions  throughout the proximal vessel. The mid vessel has a 40% stenosis. The distal vessel has mild plaque disease.  Left Ventricular Angiogram: LVEF 55% with anterior and apical hypokinesis.    Impression:  #1. Chronic ITP Platelet count stable over time with no interventions  #2. Localized mycosis fungoides resolved with topical steroid application.  #3. Coronary artery disease status post previous MI, status post coronary stent  #4. Status post excision squamous cell carcinoma of the scalp  #5. History of benign colon polyps   CC: Patient Care Team: Kristian Covey, MD as PCP - General (Family Medicine)   Levert Feinstein, MD 11/11/20144:18 PM

## 2013-04-17 DIAGNOSIS — H905 Unspecified sensorineural hearing loss: Secondary | ICD-10-CM | POA: Diagnosis not present

## 2013-04-17 DIAGNOSIS — J0120 Acute ethmoidal sinusitis, unspecified: Secondary | ICD-10-CM | POA: Diagnosis not present

## 2013-06-16 ENCOUNTER — Other Ambulatory Visit: Payer: Self-pay | Admitting: Otolaryngology

## 2013-06-16 ENCOUNTER — Ambulatory Visit
Admission: RE | Admit: 2013-06-16 | Discharge: 2013-06-16 | Disposition: A | Discharge status: Home / Self Care | Payer: Medicare Other | Source: Ambulatory Visit | Attending: Otolaryngology | Admitting: Otolaryngology

## 2013-06-16 DIAGNOSIS — J3489 Other specified disorders of nose and nasal sinuses: Secondary | ICD-10-CM | POA: Diagnosis not present

## 2013-06-16 DIAGNOSIS — R0981 Nasal congestion: Secondary | ICD-10-CM

## 2013-06-16 DIAGNOSIS — J322 Chronic ethmoidal sinusitis: Secondary | ICD-10-CM | POA: Diagnosis not present

## 2013-06-24 DIAGNOSIS — H905 Unspecified sensorineural hearing loss: Secondary | ICD-10-CM | POA: Diagnosis not present

## 2013-06-24 DIAGNOSIS — H903 Sensorineural hearing loss, bilateral: Secondary | ICD-10-CM | POA: Diagnosis not present

## 2013-07-01 ENCOUNTER — Telehealth: Payer: Self-pay | Admitting: *Deleted

## 2013-07-01 NOTE — Telephone Encounter (Addendum)
Received call from pt stating that he has been through 3 rounds of antibiotics for a sinus problem & has seen Dr Lucia Gaskins but this has not completely cleared the problem.  He states that he had right eye soreness, pressure on right side of his nose & jaw pain which has decreased but not completely gone.  He reports that a CT of sinus was done & was OK per Dr Lucia Gaskins.  The pt wonders if this has anything to do with his T cell lymphoma & would like to see Dr Beryle Beams.  He can be reached at [phone].  Note to Dr Beryle Beams. After discussing with Dr Beryle Beams informed pt that he did not think it was from his T-cell lymphoma & that Dr Lucia Gaskins has done everything needed to treat sinus problem & doesn't see need to see him early.  The pt & wife state that Dr Lucia Gaskins said that he thought it was from his T-Cell Lymphoma.  Suggested that he talk with Dr Lucia Gaskins & maybe have him discuss with Dr. Beryle Beams.  The pt reports no fever & sinus issues better but not gone & if worse will talk with Dr Lucia Gaskins.

## 2013-07-13 ENCOUNTER — Encounter: Payer: Self-pay | Admitting: Oncology

## 2013-07-23 ENCOUNTER — Other Ambulatory Visit: Payer: Self-pay | Admitting: Dermatology

## 2013-07-23 DIAGNOSIS — D0430 Carcinoma in situ of skin of unspecified part of face: Secondary | ICD-10-CM | POA: Diagnosis not present

## 2013-07-23 DIAGNOSIS — D239 Other benign neoplasm of skin, unspecified: Secondary | ICD-10-CM | POA: Diagnosis not present

## 2013-07-23 DIAGNOSIS — L570 Actinic keratosis: Secondary | ICD-10-CM | POA: Diagnosis not present

## 2013-07-23 DIAGNOSIS — Z85828 Personal history of other malignant neoplasm of skin: Secondary | ICD-10-CM | POA: Diagnosis not present

## 2013-07-23 DIAGNOSIS — D0439 Carcinoma in situ of skin of other parts of face: Secondary | ICD-10-CM | POA: Diagnosis not present

## 2013-07-23 DIAGNOSIS — D485 Neoplasm of uncertain behavior of skin: Secondary | ICD-10-CM | POA: Diagnosis not present

## 2013-07-23 DIAGNOSIS — L821 Other seborrheic keratosis: Secondary | ICD-10-CM | POA: Diagnosis not present

## 2013-07-23 DIAGNOSIS — B353 Tinea pedis: Secondary | ICD-10-CM | POA: Diagnosis not present

## 2013-07-23 DIAGNOSIS — C8409 Mycosis fungoides, extranodal and solid organ sites: Secondary | ICD-10-CM | POA: Diagnosis not present

## 2013-08-10 ENCOUNTER — Encounter: Payer: Self-pay | Admitting: Family Medicine

## 2013-08-10 ENCOUNTER — Telehealth: Payer: Self-pay | Admitting: Family Medicine

## 2013-08-10 ENCOUNTER — Ambulatory Visit (INDEPENDENT_AMBULATORY_CARE_PROVIDER_SITE_OTHER): Payer: Medicare Other | Admitting: Family Medicine

## 2013-08-10 VITALS — BP 126/78 | HR 63 | Temp 97.5°F | Ht 66.0 in | Wt 148.0 lb

## 2013-08-10 DIAGNOSIS — I2510 Atherosclerotic heart disease of native coronary artery without angina pectoris: Secondary | ICD-10-CM | POA: Diagnosis not present

## 2013-08-10 DIAGNOSIS — D696 Thrombocytopenia, unspecified: Secondary | ICD-10-CM | POA: Diagnosis not present

## 2013-08-10 DIAGNOSIS — N400 Enlarged prostate without lower urinary tract symptoms: Secondary | ICD-10-CM | POA: Diagnosis not present

## 2013-08-10 DIAGNOSIS — I10 Essential (primary) hypertension: Secondary | ICD-10-CM

## 2013-08-10 DIAGNOSIS — E785 Hyperlipidemia, unspecified: Secondary | ICD-10-CM | POA: Diagnosis not present

## 2013-08-10 DIAGNOSIS — Z0000 Encounter for general adult medical examination without abnormal findings: Secondary | ICD-10-CM | POA: Diagnosis not present

## 2013-08-10 DIAGNOSIS — Z23 Encounter for immunization: Secondary | ICD-10-CM

## 2013-08-10 LAB — PSA: PSA: 1.15 ng/mL (ref 0.10–4.00)

## 2013-08-10 MED ORDER — DOXYCYCLINE HYCLATE 100 MG PO TABS: 100.0000 mg | ORAL_TABLET | Freq: Two times a day (BID) | ORAL | Status: DC | PRN

## 2013-08-10 MED ORDER — ALPRAZOLAM 0.5 MG PO TABS: 0.5000 mg | ORAL_TABLET | Freq: Two times a day (BID) | ORAL | Status: DC | PRN

## 2013-08-10 NOTE — Addendum Note (Signed)
Addended by: Marcina Millard on: 08/10/2013 10:19 AM   Modules accepted: Orders

## 2013-08-10 NOTE — Progress Notes (Signed)
Subjective:    Patient ID: [name], male    DOB: 08-05-1940, 73 y.o.   MRN: [id number]  HPI Patient seen for Medicare wellness exam and medical followup. His chronic problems include history of CAD, hypertension, hyperlipidemia, chronic insomnia, history of cutaneous T-cell lymphoma, diverticulosis of the colon, history of kidney stones, ITP. Medications reviewed. He is followed regularly by cardiology and they're refilling most of his medications. He takes Xanax at low dosage at night. He has been on this medication for many years. No recent falls. Denies recent chest pains.. Mood stable.  Patient requesting PSA after long discussion of risk and benefits. Immunizations are reviewed. He has not had shingles vaccine because of his T-cell lymphoma. He's never had Prevnar 13. Colonoscopy is up to date.  Past Medical History  Diagnosis Date  . Allergy   . CAD (coronary artery disease)   . Hyperlipidemia   . Diverticulosis of colon   . Benign prostatic hypertrophy   . Anxiety   . History of colon polyps   . Depression   . Kidney stones     "I've had them twice; last time was ~ 9 yr ago; both times passed on their own" (10-27-12)  . Pneumonia     "I think I had as a child" (10/27/12)  . ITP (idiopathic thrombocytopenic purpura)   . Factor VII deficiency   . History of blood transfusion 1941  . Arthritis     "all over" (10-27-12)  . Chronic lower back pain     "from a herniated disc" (10/27/2012)  . Myocardial infarction 2009  . T-cell lymphoma     "I'm in stage I; original site was on my left chest; had a place around my waist treated last summer; got a place on back of my left leg" (27-Oct-2012)  . Squamous carcinoma     "left side; top of head" (27-Oct-2012)  . Basal cell carcinoma of head     "right side; top of head" (27-Oct-2012)   Past Surgical History  Procedure Laterality Date  . Vasectomy    . Cataract extraction w/ intraocular lens  implant, bilateral Bilateral 2000's    . Mohs surgery Bilateral 2011-2012    "top of my head" (October 27, 2012)  . Colonoscopy    . Coronary angioplasty with stent placement  05/29/2007    "1" (2012-10-27)  . Cardiac catheterization  10-27-12  . Tonsillectomy  ~ 1945  . Cyst excision  1980's?    "or fatty tumor; taken off my back" (10/27/12)  . Lumbar epidural injection  ~ 2004    reports that he has never smoked. He has never used smokeless tobacco. He reports that he does not drink alcohol or use illicit drugs. family history includes Alzheimer's disease in his mother; COPD in his brother; Colon cancer in his paternal uncle; Coronary artery disease in his brother; Diabetes in his brother and brother; Lymphoma in his father. Allergies  Allergen Reactions  . Other     Antidepressants...has a variety of reactions to different antidepressants  . Prozac [Fluoxetine Hcl]     nighmares   1.  Risk factors based on Past Medical , Social, and Family history reviewed and as above 2.  Limitations in physical activities very active. Low risk for fall 3.  Depression/mood no depression or anxiety issues 4.  Hearing no recent changes 5.  ADLs independent in all 6.  Cognitive function (orientation to time and place, language, writing, speech,memory) no memory deficits. Only which and  judgment intact 7.  Home Safety no issues 8.  Height, weight, and visual acuity. All stable 9.  Counseling discussed the importance of weight bearing exercise and minimization of fall risk 10. Recommendation of preventive services. Prevnar 13 11. Labs based on risk factors PSA per patient request 12. Care Plan as above    Review of Systems  Constitutional: Negative for fever, activity change, appetite change and fatigue.  HENT: Negative for congestion, ear pain and trouble swallowing.   Eyes: Negative for pain and visual disturbance.  Respiratory: Negative for cough, shortness of breath and wheezing.   Cardiovascular: Negative for chest pain and  palpitations.  Gastrointestinal: Negative for nausea, vomiting, abdominal pain, diarrhea, constipation, blood in stool, abdominal distention and rectal pain.  Genitourinary: Negative for dysuria, hematuria and testicular pain.  Musculoskeletal: Negative for arthralgias and joint swelling.  Skin: Negative for rash.  Neurological: Negative for dizziness, syncope and headaches.  Hematological: Negative for adenopathy.  Psychiatric/Behavioral: Negative for confusion and dysphoric mood.       Objective:   Physical Exam  Constitutional: He is oriented to person, place, and time. He appears well-developed and well-nourished. No distress.  HENT:  Head: Normocephalic and atraumatic.  Right Ear: External ear normal.  Left Ear: External ear normal.  Mouth/Throat: Oropharynx is clear and moist.  Eyes: Conjunctivae and EOM are normal. Pupils are equal, round, and reactive to light.  Neck: Normal range of motion. Neck supple. No thyromegaly present.  Cardiovascular: Normal rate, regular rhythm and normal heart sounds.   No murmur heard. Pulmonary/Chest: No respiratory distress. He has no wheezes. He has no rales.  Abdominal: Soft. Bowel sounds are normal. He exhibits no distension and no mass. There is no tenderness. There is no rebound and no guarding.  Genitourinary: Rectum normal and prostate normal.  Musculoskeletal: He exhibits no edema.  Lymphadenopathy:    He has no cervical adenopathy.  Neurological: He is alert and oriented to person, place, and time. He displays normal reflexes. No cranial nerve deficit.  Skin: No rash noted.  Psychiatric: He has a normal mood and affect.          Assessment & Plan:  #1 health maintenance. Prevnar 13 given. PSA per patient request. He cannot take shingles vaccine secondary to history of T-cell lymphoma #2 history of hypertension which is been well controlled. Continue current medications #3 history of CAD. He has lipids followed closely by  cardiology and this will be deferred today #4 history of cutaneous T-cell lymphoma

## 2013-08-10 NOTE — Telephone Encounter (Signed)
Relevant patient education assigned to patient using Emmi. ° °

## 2013-08-10 NOTE — Progress Notes (Signed)
Pre visit review using our clinic review tool, if applicable. No additional management support is needed unless otherwise documented below in the visit note. 

## 2013-08-11 DIAGNOSIS — C8409 Mycosis fungoides, extranodal and solid organ sites: Secondary | ICD-10-CM | POA: Diagnosis not present

## 2013-08-17 DIAGNOSIS — H5710 Ocular pain, unspecified eye: Secondary | ICD-10-CM | POA: Diagnosis not present

## 2013-09-07 ENCOUNTER — Other Ambulatory Visit (INDEPENDENT_AMBULATORY_CARE_PROVIDER_SITE_OTHER): Payer: Medicare Other

## 2013-09-07 DIAGNOSIS — D696 Thrombocytopenia, unspecified: Secondary | ICD-10-CM

## 2013-09-07 DIAGNOSIS — C8409 Mycosis fungoides, extranodal and solid organ sites: Secondary | ICD-10-CM | POA: Diagnosis not present

## 2013-09-07 DIAGNOSIS — C8400 Mycosis fungoides, unspecified site: Secondary | ICD-10-CM

## 2013-09-07 LAB — CBC WITH DIFFERENTIAL/PLATELET
Basophils Absolute: 0 10*3/uL (ref 0.0–0.1)
Basophils Relative: 1 % (ref 0–1)
EOS ABS: 0.1 10*3/uL (ref 0.0–0.7)
EOS PCT: 3 % (ref 0–5)
HCT: 45.8 % (ref 39.0–52.0)
Hemoglobin: 15.3 g/dL (ref 13.0–17.0)
LYMPHS ABS: 1.5 10*3/uL (ref 0.7–4.0)
Lymphocytes Relative: 34 % (ref 12–46)
MCH: 33.4 pg (ref 26.0–34.0)
MCHC: 33.4 g/dL (ref 30.0–36.0)
MCV: 100 fL (ref 78.0–100.0)
Monocytes Absolute: 0.3 10*3/uL (ref 0.1–1.0)
Monocytes Relative: 7 % (ref 3–12)
Neutro Abs: 2.5 10*3/uL (ref 1.7–7.7)
Neutrophils Relative %: 55 % (ref 43–77)
RBC: 4.58 MIL/uL (ref 4.22–5.81)
RDW: 12.5 % (ref 11.5–15.5)
WBC: 4.5 10*3/uL (ref 4.0–10.5)

## 2013-09-07 LAB — COMPLETE METABOLIC PANEL WITH GFR
ALT: 22 U/L (ref 0–53)
AST: 20 U/L (ref 0–37)
Albumin: 3.5 g/dL (ref 3.5–5.2)
Alkaline Phosphatase: 61 U/L (ref 39–117)
BILIRUBIN TOTAL: 0.9 mg/dL (ref 0.3–1.2)
BUN: 22 mg/dL (ref 6–23)
CO2: 27 meq/L (ref 19–32)
CREATININE: 0.89 mg/dL (ref 0.50–1.35)
Calcium: 9 mg/dL (ref 8.4–10.5)
Chloride: 103 mEq/L (ref 96–112)
GFR, EST NON AFRICAN AMERICAN: 84 mL/min
GLUCOSE: 117 mg/dL — AB (ref 70–99)
Potassium: 4 mEq/L (ref 3.5–5.3)
Sodium: 141 mEq/L (ref 135–145)
Total Protein: 5.8 g/dL — ABNORMAL LOW (ref 6.0–8.3)

## 2013-09-07 LAB — LACTATE DEHYDROGENASE: LDH: 166 U/L (ref 94–250)

## 2013-09-14 ENCOUNTER — Ambulatory Visit (INDEPENDENT_AMBULATORY_CARE_PROVIDER_SITE_OTHER): Payer: Medicare Other | Admitting: Oncology

## 2013-09-14 ENCOUNTER — Encounter: Payer: Self-pay | Admitting: Oncology

## 2013-09-14 ENCOUNTER — Telehealth: Payer: Self-pay | Admitting: *Deleted

## 2013-09-14 VITALS — BP 125/69 | HR 56 | Temp 97.0°F | Ht 66.0 in | Wt 144.6 lb

## 2013-09-14 DIAGNOSIS — C8409 Mycosis fungoides, extranodal and solid organ sites: Secondary | ICD-10-CM | POA: Diagnosis not present

## 2013-09-14 DIAGNOSIS — D696 Thrombocytopenia, unspecified: Secondary | ICD-10-CM

## 2013-09-14 DIAGNOSIS — D693 Immune thrombocytopenic purpura: Secondary | ICD-10-CM | POA: Diagnosis not present

## 2013-09-14 DIAGNOSIS — C4442 Squamous cell carcinoma of skin of scalp and neck: Secondary | ICD-10-CM | POA: Diagnosis not present

## 2013-09-14 DIAGNOSIS — I252 Old myocardial infarction: Secondary | ICD-10-CM

## 2013-09-14 DIAGNOSIS — D126 Benign neoplasm of colon, unspecified: Secondary | ICD-10-CM | POA: Diagnosis not present

## 2013-09-14 DIAGNOSIS — C8400 Mycosis fungoides, unspecified site: Secondary | ICD-10-CM

## 2013-09-14 DIAGNOSIS — I2510 Atherosclerotic heart disease of native coronary artery without angina pectoris: Secondary | ICD-10-CM

## 2013-09-14 LAB — CBC WITH DIFFERENTIAL/PLATELET
Basophils Absolute: 0 10*3/uL (ref 0.0–0.1)
Basophils Relative: 0 % (ref 0–1)
Eosinophils Absolute: 0.1 10*3/uL (ref 0.0–0.7)
Eosinophils Relative: 2 % (ref 0–5)
HCT: 46.3 % (ref 39.0–52.0)
Hemoglobin: 15.5 g/dL (ref 13.0–17.0)
LYMPHS ABS: 1.3 10*3/uL (ref 0.7–4.0)
LYMPHS PCT: 28 % (ref 12–46)
MCH: 33.8 pg (ref 26.0–34.0)
MCHC: 33.5 g/dL (ref 30.0–36.0)
MCV: 100.9 fL — ABNORMAL HIGH (ref 78.0–100.0)
Monocytes Absolute: 0.3 10*3/uL (ref 0.1–1.0)
Monocytes Relative: 6 % (ref 3–12)
NEUTROS ABS: 2.9 10*3/uL (ref 1.7–7.7)
NEUTROS PCT: 64 % (ref 43–77)
PLATELETS: 111 10*3/uL — AB (ref 150–400)
RBC: 4.59 MIL/uL (ref 4.22–5.81)
RDW: 12.7 % (ref 11.5–15.5)
WBC: 4.6 10*3/uL (ref 4.0–10.5)

## 2013-09-14 NOTE — Progress Notes (Signed)
Patient ID: [name], male   DOB: 21-Feb-1941, 73 y.o.   MRN: [id number] Hematology and Oncology Follow Up Visit  [name] [id number] 14-Nov-1940 73 y.o. 09/14/2013 1:01 PM   Principle Diagnosis: Encounter Diagnoses  Name Primary?  . THROMBOCYTOPENIA Yes  . Mycosis fungoides, stage 1      Interim History:   Followup visit for this 73 year old man initially diagnosed with ITP in November 2009. Platelet counts have been consistently over 100,000. He never required any specific treatment. He developed a patchy rash over a 4 cm area, left pectoral region of his chest, which was there for about 7 years. He changed dermatologists. He was evaluated by Dr. Jari Pigg. 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 He was referred to Dr. Adelene Idler at [facility]. Repeat biopsies were confirmatory. He was treated with topical clobetasol ointment with complete regression of the rash. I obtained baseline CAT scans in August of 2012 and there was no organomegaly or lymphadenopathy. However, a lesion was seen on his scalp which was subsequently excised on 05/31/2011 and showed poorly differentiated squamous cell carcinoma. This appeared to be a localized process and not a metastasis. He also has a history of a  tubular adenoma removed at time of a colonoscopy 09/04/2011 by Dr. Oretha Caprice.  Overall he is doing well. He is currently receiving 5 fluorouracil topical ointment for the last 4 weeks on the skin over his left eye for a local recurrence of the mycosis fungoides. He saw Dr. Irish Elders back in January she discharged him for further followup on an as needed only basis. He has not developed any other new skin lesions.  He had a recent persistent sinus infection and took 3 courses of antibiotics over a 40 day interval before resolution.  He developed a large hematoma on his left foot but does not recall any trauma. Of note he has been on aspirin  and Plavix status post coronary stent in addition to his chronic borderline platelet count and my guess is that he inadvertently did have some minor trauma to his foot to result in the bruises.  He is accompanied by his wife again today who always wears a mask over her face. Their are children are coming into town for Mother's Day.   Medications: reviewed  Allergies:  Allergies  Allergen Reactions  . Other     Antidepressants...has a variety of reactions to different antidepressants  . Prozac [Fluoxetine Hcl]     nighmares    Review of Systems: Hematology:  See above ENT ROS: See above Breast ROS:  Respiratory ROS: No cough or dyspnea Cardiovascular ROS:  No chest pain or palpitations Gastrointestinal ROS: No abdominal pain or change in bowel habit   Genito-Urinary ROS: Not questioned  Musculoskeletal ROS: No muscle bone or joint pain Neurological ROS: No headache or change in vision Dermatological ROS: See above Remaining ROS negative:   Physical Exam: Blood pressure 125/69, pulse 56, temperature 97 F (36.1 C), temperature source Oral, height 5\' 6"  (1.676 m), weight 144 lb 9.6 oz (65.59 kg), SpO2 100.00%. Wt Readings from Last 3 Encounters:  09/14/13 144 lb 9.6 oz (65.59 kg)  08/10/13 148 lb (67.132 kg)  03/23/13 143 lb 14.4 oz (65.273 kg)     General appearance: Thin Caucasian man HENNT: Erythematous band over the left eyelid where he is currently receiving chemotherapy appointment. Pharynx no erythema, exudate, mass, or ulcer. No thyromegaly  or thyroid nodules Lymph nodes: No cervical, supraclavicular, or axillary lymphadenopathy Breasts:  Lungs: Clear to auscultation, resonant to percussion throughout Heart: Regular rhythm, no murmur, no gallop, no rub, no click, no edema Abdomen: Soft, nontender, normal bowel sounds, no mass, no organomegaly Extremities: No edema, no calf tenderness Musculoskeletal: no joint deformities GU:  Vascular: Carotid pulses 2+, no  bruits,  Neurologic: Alert, oriented, PERRLA,  cranial nerves grossly normal, motor strength 5 over 5, reflexes 1+ symmetric, upper body coordination normal, gait normal, Skin: No rash or ecchymosis except for noted above with a rash over his left eye under active treatment  Lab Results: CBC W/Diff    Component Value Date/Time   WBC 4.6 09/14/2013 0959   WBC 5.2 03/23/2013 1424   RBC 4.59 09/14/2013 0959   RBC 4.54 03/23/2013 1424   HGB 15.5 09/14/2013 0959   HGB 14.6 03/23/2013 1424   HCT 46.3 09/14/2013 0959   HCT 45.4 03/23/2013 1424   PLT 111* 09/14/2013 0959   PLT 108* 03/23/2013 1424   MCV 100.9* 09/14/2013 0959   MCV 100.0* 03/23/2013 1424   MCH 33.8 09/14/2013 0959   MCH 32.3 03/23/2013 1424   MCHC 33.5 09/14/2013 0959   MCHC 32.3 03/23/2013 1424   RDW 12.7 09/14/2013 0959   RDW 12.7 03/23/2013 1424   LYMPHSABS 1.3 09/14/2013 0959   LYMPHSABS 1.1 03/23/2013 1424   MONOABS 0.3 09/14/2013 0959   MONOABS 0.4 03/23/2013 1424   EOSABS 0.1 09/14/2013 0959   EOSABS 0.1 03/23/2013 1424   BASOSABS 0.0 09/14/2013 0959   BASOSABS 0.0 03/23/2013 1424     Chemistry      Component Value Date/Time   NA 141 09/07/2013 0925   NA 142 03/23/2013 1424   NA 140 01/10/2011 1442   K 4.0 09/07/2013 0925   K 4.7 03/23/2013 1424   K 4.6 01/10/2011 1442   CL 103 09/07/2013 0925   CL 106 09/22/2012 1428   CL 102 01/10/2011 1442   CO2 27 09/07/2013 0925   CO2 28 03/23/2013 1424   CO2 26 01/10/2011 1442   BUN 22 09/07/2013 0925   BUN 18.1 03/23/2013 1424   BUN 19 01/10/2011 1442   CREATININE 0.89 09/07/2013 0925   CREATININE 0.8 03/23/2013 1424   CREATININE 1.0 10/02/2012 0945      Component Value Date/Time   CALCIUM 9.0 09/07/2013 0925   CALCIUM 9.4 03/23/2013 1424   CALCIUM 8.7 01/10/2011 1442   ALKPHOS 61 09/07/2013 0925   ALKPHOS 62 03/23/2013 1424   AST 20 09/07/2013 0925   AST 25 03/23/2013 1424   ALT 22 09/07/2013 0925   ALT 27 03/23/2013 1424   BILITOT 0.9 09/07/2013 0925   BILITOT 0.86 03/23/2013 1424     Platelets were clumped on lab done April 27. I repeated the CBC today and a citrate anticoagulated to a platelet count is at his baseline of 111,000. White count 4600 with 64% neutrophils, 28% lymphocytes, 6% monocytes, 2% eosinophils. Blood chemistries normal on 09/07/2013 except slight decrease in total protein 5.8 g percent and random glucose 117    Impression:  #1. 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 Stage I mycosis fungoides Minimal skin involvement the treatment as outlined above.  #3. Coronary artery disease status post MI January 2009 status post angioplasty and stent to the LAD  #4. Squamous cell carcinoma of the scalp treated with primary excision  #5. Tubular adenoma of the colon resected   CC: Patient  Care Team: Eulas Post, MD as PCP - General (Family Medicine) Annia Belt, MD as Consulting Physician (Oncology) Josue Hector, MD as Consulting Physician (Cardiology) Jari Pigg, MD as Consulting Physician (Dermatology) Renaldo Harrison, MD as Referring Physician (Dermatology)   Annia Belt, MD 5/4/20151:01 PM

## 2013-09-14 NOTE — Patient Instructions (Signed)
To lab today Repeat lab and on March 15, 2014  MD visit 30 minutes  November 9th

## 2013-09-14 NOTE — Telephone Encounter (Signed)
Pt notified that his platelet count was 111,000, "which was better than his usual baseline" per Dr. Beryle Beams. Pt verbalized understanding of this information and stated "that's about what I've been running, thank you." Yvonna Alanis, RN, 09/14/13 - 7:07 PM

## 2013-11-05 DIAGNOSIS — L570 Actinic keratosis: Secondary | ICD-10-CM | POA: Diagnosis not present

## 2013-11-05 DIAGNOSIS — L821 Other seborrheic keratosis: Secondary | ICD-10-CM | POA: Diagnosis not present

## 2013-11-05 DIAGNOSIS — D0439 Carcinoma in situ of skin of other parts of face: Secondary | ICD-10-CM | POA: Diagnosis not present

## 2013-11-05 DIAGNOSIS — D0430 Carcinoma in situ of skin of unspecified part of face: Secondary | ICD-10-CM | POA: Diagnosis not present

## 2013-11-26 DIAGNOSIS — I2109 ST elevation (STEMI) myocardial infarction involving other coronary artery of anterior wall: Secondary | ICD-10-CM | POA: Diagnosis not present

## 2013-11-26 DIAGNOSIS — Z9861 Coronary angioplasty status: Secondary | ICD-10-CM | POA: Diagnosis not present

## 2013-11-26 DIAGNOSIS — D696 Thrombocytopenia, unspecified: Secondary | ICD-10-CM | POA: Diagnosis not present

## 2013-11-26 DIAGNOSIS — E782 Mixed hyperlipidemia: Secondary | ICD-10-CM | POA: Diagnosis not present

## 2014-02-03 DIAGNOSIS — Z23 Encounter for immunization: Secondary | ICD-10-CM | POA: Diagnosis not present

## 2014-03-02 ENCOUNTER — Emergency Department (HOSPITAL_COMMUNITY)
Admission: EM | Admit: 2014-03-02 | Discharge: 2014-03-02 | Disposition: A | Discharge status: Home / Self Care | Payer: Medicare Other | Attending: Emergency Medicine | Admitting: Emergency Medicine

## 2014-03-02 ENCOUNTER — Encounter (HOSPITAL_COMMUNITY): Payer: Self-pay | Admitting: Emergency Medicine

## 2014-03-02 ENCOUNTER — Emergency Department (HOSPITAL_COMMUNITY): Payer: Medicare Other

## 2014-03-02 DIAGNOSIS — G8929 Other chronic pain: Secondary | ICD-10-CM | POA: Diagnosis not present

## 2014-03-02 DIAGNOSIS — Y92410 Unspecified street and highway as the place of occurrence of the external cause: Secondary | ICD-10-CM | POA: Insufficient documentation

## 2014-03-02 DIAGNOSIS — Z7902 Long term (current) use of antithrombotics/antiplatelets: Secondary | ICD-10-CM | POA: Diagnosis not present

## 2014-03-02 DIAGNOSIS — F329 Major depressive disorder, single episode, unspecified: Secondary | ICD-10-CM | POA: Insufficient documentation

## 2014-03-02 DIAGNOSIS — Z79899 Other long term (current) drug therapy: Secondary | ICD-10-CM | POA: Diagnosis not present

## 2014-03-02 DIAGNOSIS — Z86010 Personal history of colonic polyps: Secondary | ICD-10-CM | POA: Insufficient documentation

## 2014-03-02 DIAGNOSIS — M7041 Prepatellar bursitis, right knee: Secondary | ICD-10-CM | POA: Diagnosis not present

## 2014-03-02 DIAGNOSIS — E785 Hyperlipidemia, unspecified: Secondary | ICD-10-CM | POA: Diagnosis not present

## 2014-03-02 DIAGNOSIS — Z87442 Personal history of urinary calculi: Secondary | ICD-10-CM | POA: Diagnosis not present

## 2014-03-02 DIAGNOSIS — M1990 Unspecified osteoarthritis, unspecified site: Secondary | ICD-10-CM | POA: Insufficient documentation

## 2014-03-02 DIAGNOSIS — Y9389 Activity, other specified: Secondary | ICD-10-CM | POA: Insufficient documentation

## 2014-03-02 DIAGNOSIS — Z7982 Long term (current) use of aspirin: Secondary | ICD-10-CM | POA: Insufficient documentation

## 2014-03-02 DIAGNOSIS — F419 Anxiety disorder, unspecified: Secondary | ICD-10-CM | POA: Insufficient documentation

## 2014-03-02 DIAGNOSIS — Z955 Presence of coronary angioplasty implant and graft: Secondary | ICD-10-CM | POA: Diagnosis not present

## 2014-03-02 DIAGNOSIS — Z8579 Personal history of other malignant neoplasms of lymphoid, hematopoietic and related tissues: Secondary | ICD-10-CM | POA: Diagnosis not present

## 2014-03-02 DIAGNOSIS — Z8701 Personal history of pneumonia (recurrent): Secondary | ICD-10-CM | POA: Diagnosis not present

## 2014-03-02 DIAGNOSIS — D682 Hereditary deficiency of other clotting factors: Secondary | ICD-10-CM | POA: Insufficient documentation

## 2014-03-02 DIAGNOSIS — I252 Old myocardial infarction: Secondary | ICD-10-CM | POA: Diagnosis not present

## 2014-03-02 DIAGNOSIS — I2510 Atherosclerotic heart disease of native coronary artery without angina pectoris: Secondary | ICD-10-CM | POA: Diagnosis not present

## 2014-03-02 DIAGNOSIS — D693 Immune thrombocytopenic purpura: Secondary | ICD-10-CM | POA: Diagnosis not present

## 2014-03-02 DIAGNOSIS — Z85828 Personal history of other malignant neoplasm of skin: Secondary | ICD-10-CM | POA: Insufficient documentation

## 2014-03-02 DIAGNOSIS — M25561 Pain in right knee: Secondary | ICD-10-CM | POA: Diagnosis not present

## 2014-03-02 DIAGNOSIS — Z87448 Personal history of other diseases of urinary system: Secondary | ICD-10-CM | POA: Insufficient documentation

## 2014-03-02 DIAGNOSIS — S8991XA Unspecified injury of right lower leg, initial encounter: Secondary | ICD-10-CM | POA: Diagnosis present

## 2014-03-02 DIAGNOSIS — M7989 Other specified soft tissue disorders: Secondary | ICD-10-CM | POA: Diagnosis not present
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
## 2014-03-02 NOTE — Discharge Instructions (Signed)
Prepatellar Bursitis   Bursitis is a condition that is characterized by inflammation of a bursa. Saunders Revel exists in many areas of the body. They are fluid-filled sacs that lie between a soft tissue (skin, tendon, or ligament) and a bone, and they reduce friction between the structures as well as the stress placed on the soft tissue. Prepatellar bursitis is inflammation of the bursa that lies between the skin and the kneecap (patella). This condition often causes pain over the patella. SYMPTOMS   Pain, tenderness, and/or inflammation over the patella.  Pain that worsens with movement of the knee joint.  Decreased range of motion for the knee joint.  A crackling sound (crepitation) when the bursa is moved or touched.  Occasionally, painless swelling of the bursa.  Fever (when infected). CAUSES  Bursitis is caused by damage to the bursa, which results in an inflammatory response. Common mechanisms of injury include:  Direct trauma to the front of the knee.  Repetitive and/or stressful use of the knee. RISK INCREASES WITH:  Activities in which kneeling and/or falling on one's knees is likely (volleyball or football).  Repetitive and stressful training, especially if it involves running on hills.  Improper training techniques, such as a sudden increase in the intensity, frequency, or duration of training.  Failure to warm up properly before activity.  Poor technique.  Artificial turf. PREVENTION   Avoid kneeling or falling on your knees.  Warm up and stretch properly before activity.  Allow for adequate recovery between workouts.  Maintain physical fitness:  Strength, flexibility, and endurance.  Cardiovascular fitness.  Learn and use proper technique. When possible, have a coach correct improper technique.  Wear properly fitted and padded protective equipment (kneepads). PROGNOSIS  If treated properly, then the symptoms of prepatellar bursitis usually resolve within 2  weeks. RELATED COMPLICATIONS   Recurrent symptoms that result in a chronic problem.  Prolonged healing time, if improperly treated or reinjured.  Limited range of motion.  Infection of bursa.  Chronic inflammation or scarring of bursa. TREATMENT  Treatment initially involves the use of ice and medication to help reduce pain and inflammation. The use of strengthening and stretching exercises may help reduce pain with activity, especially those of the quadriceps and hamstring muscles. These exercises may be performed at home or with referral to a therapist. Your caregiver may recommend kneepads when you return to playing sports, in order to reduce the stress on the prepatellar bursa. If symptoms persist despite treatment, then your caregiver may drain fluid out with a needle (aspirate) the bursa. If symptoms persist for greater than 6 months despite nonsurgical (conservative) treatment, then surgery may be recommended to remove the bursa.  MEDICATION  If pain medication is necessary, then nonsteroidal anti-inflammatory medications, such as aspirin and ibuprofen, or other minor pain relievers, such as acetaminophen, are often recommended.  Do not take pain medication for 7 days before surgery.  Prescription pain relievers may be given if deemed necessary by your caregiver. Use only as directed and only as much as you need.  Corticosteroid injections may be given by your caregiver. These injections should be reserved for the most serious cases, because they may only be given a certain number of times. HEAT AND COLD  Cold treatment (icing) relieves pain and reduces inflammation. Cold treatment should be applied for 10 to 15 minutes every 2 to 3 hours for inflammation and pain and immediately after any activity that aggravates your symptoms. Use ice packs or massage the area with a  piece of ice (ice massage).  Heat treatment may be used prior to performing the stretching and strengthening  activities prescribed by your caregiver, physical therapist, or athletic trainer. Use a heat pack or soak the injury in warm water. SEEK MEDICAL CARE IF:  Treatment seems to offer no benefit, or the condition worsens.  Any medications produce adverse side effects. Document Released: 04/30/2005 Document Revised: 09/14/2013 Document Reviewed: 08/12/2008 [facility] Patient Information 2015 [place], [place]. This information is not intended to replace advice given to you by your health care provider. Make sure you discuss any questions you have with your health care provider.
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
## 2014-03-03 NOTE — ED Provider Notes (Signed)
Medical screening examination/treatment/procedure(s) were performed by non-physician practitioner and as supervising physician I was immediately available for consultation/collaboration.   EKG Interpretation None        Francine Graven, DO 03/03/14 1803

## 2014-03-05 DIAGNOSIS — M25561 Pain in right knee: Secondary | ICD-10-CM | POA: Diagnosis not present

## 2014-03-08 ENCOUNTER — Other Ambulatory Visit (INDEPENDENT_AMBULATORY_CARE_PROVIDER_SITE_OTHER): Payer: Medicare Other

## 2014-03-08 ENCOUNTER — Ambulatory Visit (INDEPENDENT_AMBULATORY_CARE_PROVIDER_SITE_OTHER): Payer: Medicare Other | Admitting: Cardiovascular Disease

## 2014-03-08 ENCOUNTER — Encounter: Payer: Self-pay | Admitting: Cardiovascular Disease

## 2014-03-08 VITALS — BP 112/62 | HR 65 | Ht 66.0 in | Wt 140.1 lb

## 2014-03-08 DIAGNOSIS — I2510 Atherosclerotic heart disease of native coronary artery without angina pectoris: Secondary | ICD-10-CM

## 2014-03-08 DIAGNOSIS — R001 Bradycardia, unspecified: Secondary | ICD-10-CM | POA: Diagnosis not present

## 2014-03-08 DIAGNOSIS — C8400 Mycosis fungoides, unspecified site: Secondary | ICD-10-CM

## 2014-03-08 DIAGNOSIS — I10 Essential (primary) hypertension: Secondary | ICD-10-CM

## 2014-03-08 DIAGNOSIS — D696 Thrombocytopenia, unspecified: Secondary | ICD-10-CM

## 2014-03-08 DIAGNOSIS — E785 Hyperlipidemia, unspecified: Secondary | ICD-10-CM | POA: Diagnosis not present

## 2014-03-08 LAB — CBC WITH DIFFERENTIAL/PLATELET
BASOS ABS: 0 10*3/uL (ref 0.0–0.1)
Basophils Relative: 0 % (ref 0–1)
Eosinophils Absolute: 0.1 10*3/uL (ref 0.0–0.7)
Eosinophils Relative: 2 % (ref 0–5)
HCT: 44.1 % (ref 39.0–52.0)
Hemoglobin: 15 g/dL (ref 13.0–17.0)
LYMPHS PCT: 33 % (ref 12–46)
Lymphs Abs: 1.6 10*3/uL (ref 0.7–4.0)
MCH: 34.1 pg — ABNORMAL HIGH (ref 26.0–34.0)
MCHC: 34 g/dL (ref 30.0–36.0)
MCV: 100.2 fL — ABNORMAL HIGH (ref 78.0–100.0)
Monocytes Absolute: 0.3 10*3/uL (ref 0.1–1.0)
Monocytes Relative: 7 % (ref 3–12)
NEUTROS ABS: 2.8 10*3/uL (ref 1.7–7.7)
Neutrophils Relative %: 58 % (ref 43–77)
PLATELETS: 126 10*3/uL — AB (ref 150–400)
RBC: 4.4 MIL/uL (ref 4.22–5.81)
RDW: 12.7 % (ref 11.5–15.5)
WBC: 4.8 10*3/uL (ref 4.0–10.5)

## 2014-03-08 LAB — COMPLETE METABOLIC PANEL WITH GFR
ALT: 19 U/L (ref 0–53)
AST: 22 U/L (ref 0–37)
Albumin: 3.8 g/dL (ref 3.5–5.2)
Alkaline Phosphatase: 68 U/L (ref 39–117)
BILIRUBIN TOTAL: 1.5 mg/dL — AB (ref 0.3–1.2)
BUN: 17 mg/dL (ref 6–23)
CO2: 30 meq/L (ref 19–32)
CREATININE: 0.92 mg/dL (ref 0.50–1.35)
Calcium: 9.5 mg/dL (ref 8.4–10.5)
Chloride: 103 mEq/L (ref 96–112)
GFR, Est Non African American: 81 mL/min
Glucose, Bld: 93 mg/dL (ref 70–99)
Potassium: 4.9 mEq/L (ref 3.5–5.3)
Sodium: 143 mEq/L (ref 135–145)
Total Protein: 6.3 g/dL (ref 6.0–8.3)

## 2014-03-08 LAB — LACTATE DEHYDROGENASE: LDH: 236 U/L (ref 94–250)

## 2014-03-08 MED ORDER — NITROGLYCERIN 0.4 MG SL SUBL: 0.4000 mg | SUBLINGUAL_TABLET | SUBLINGUAL | Status: DC | PRN

## 2014-03-08 MED ORDER — CARVEDILOL 3.125 MG PO TABS: 3.1250 mg | ORAL_TABLET | Freq: Two times a day (BID) | ORAL | Status: DC

## 2014-03-08 MED ORDER — CLOPIDOGREL BISULFATE 75 MG PO TABS: 75.0000 mg | ORAL_TABLET | Freq: Every day | ORAL | Status: DC

## 2014-03-08 MED ORDER — LOSARTAN POTASSIUM 50 MG PO TABS: 50.0000 mg | ORAL_TABLET | Freq: Every day | ORAL | Status: DC

## 2014-03-08 MED ORDER — ATORVASTATIN CALCIUM 20 MG PO TABS: 20.0000 mg | ORAL_TABLET | Freq: Every day | ORAL | Status: DC

## 2014-03-08 NOTE — Patient Instructions (Signed)
Your physician wants you to follow-up in:  6 months. You will receive a reminder letter in the mail two months in advance. If you don't receive a letter, please call our office to schedule the follow-up appointment.   
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
## 2014-03-08 NOTE — Addendum Note (Signed)
Addended by: Thompson Grayer on: 03/08/2014 04:08 PM   Modules accepted: Orders

## 2014-03-09 ENCOUNTER — Telehealth: Payer: Self-pay | Admitting: *Deleted

## 2014-03-09 DIAGNOSIS — H01001 Unspecified blepharitis right upper eyelid: Secondary | ICD-10-CM | POA: Diagnosis not present

## 2014-03-09 DIAGNOSIS — H35373 Puckering of macula, bilateral: Secondary | ICD-10-CM | POA: Diagnosis not present

## 2014-03-09 DIAGNOSIS — H40013 Open angle with borderline findings, low risk, bilateral: Secondary | ICD-10-CM | POA: Diagnosis not present

## 2014-03-09 DIAGNOSIS — H43813 Vitreous degeneration, bilateral: Secondary | ICD-10-CM | POA: Diagnosis not present

## 2014-03-09 NOTE — Telephone Encounter (Signed)
Pt called /Informed labs stable and platelets @ 126,00, better than last time per Dr Beryle Beams. Pt undertood ; no questions.

## 2014-03-09 NOTE — Telephone Encounter (Signed)
Message copied by Ebbie Latus on Tue Mar 09, 2014  9:51 AM ------      Message from: Annia Belt      Created: Mon Mar 08, 2014  6:12 PM       Call pt lab stable: platelets 126,000 better than last time ------

## 2014-03-10 DIAGNOSIS — L570 Actinic keratosis: Secondary | ICD-10-CM | POA: Diagnosis not present

## 2014-03-22 ENCOUNTER — Ambulatory Visit (INDEPENDENT_AMBULATORY_CARE_PROVIDER_SITE_OTHER): Payer: Medicare Other | Admitting: Family Medicine

## 2014-03-22 ENCOUNTER — Encounter: Payer: Self-pay | Admitting: Family Medicine

## 2014-03-22 VITALS — BP 128/70 | HR 70 | Temp 97.4°F | Wt 139.0 lb

## 2014-03-22 DIAGNOSIS — R05 Cough: Secondary | ICD-10-CM

## 2014-03-22 DIAGNOSIS — R053 Chronic cough: Secondary | ICD-10-CM

## 2014-03-22 DIAGNOSIS — I2510 Atherosclerotic heart disease of native coronary artery without angina pectoris: Secondary | ICD-10-CM | POA: Diagnosis not present

## 2014-03-22 DIAGNOSIS — R634 Abnormal weight loss: Secondary | ICD-10-CM

## 2014-03-22 DIAGNOSIS — M25561 Pain in right knee: Secondary | ICD-10-CM | POA: Diagnosis not present

## 2014-03-22 NOTE — Progress Notes (Signed)
Pre visit review using our clinic review tool, if applicable. No additional management support is needed unless otherwise documented below in the visit note. 
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
## 2014-03-23 ENCOUNTER — Other Ambulatory Visit: Payer: Medicare Other

## 2014-03-23 ENCOUNTER — Ambulatory Visit (INDEPENDENT_AMBULATORY_CARE_PROVIDER_SITE_OTHER)
Admission: RE | Admit: 2014-03-23 | Discharge: 2014-03-23 | Disposition: A | Discharge status: Home / Self Care | Payer: Medicare Other | Source: Ambulatory Visit | Attending: Family Medicine | Admitting: Family Medicine

## 2014-03-23 DIAGNOSIS — R634 Abnormal weight loss: Secondary | ICD-10-CM | POA: Diagnosis not present

## 2014-03-23 DIAGNOSIS — R05 Cough: Secondary | ICD-10-CM

## 2014-03-23 DIAGNOSIS — R053 Chronic cough: Secondary | ICD-10-CM

## 2014-03-29 ENCOUNTER — Encounter: Payer: Self-pay | Admitting: Oncology

## 2014-03-29 ENCOUNTER — Ambulatory Visit (INDEPENDENT_AMBULATORY_CARE_PROVIDER_SITE_OTHER): Payer: Medicare Other | Admitting: Oncology

## 2014-03-29 VITALS — BP 131/72 | HR 65 | Temp 97.6°F | Resp 20 | Ht 65.0 in | Wt 138.8 lb

## 2014-03-29 DIAGNOSIS — D693 Immune thrombocytopenic purpura: Secondary | ICD-10-CM

## 2014-03-29 DIAGNOSIS — K8020 Calculus of gallbladder without cholecystitis without obstruction: Secondary | ICD-10-CM

## 2014-03-29 DIAGNOSIS — D696 Thrombocytopenia, unspecified: Secondary | ICD-10-CM | POA: Diagnosis not present

## 2014-03-29 DIAGNOSIS — I2510 Atherosclerotic heart disease of native coronary artery without angina pectoris: Secondary | ICD-10-CM

## 2014-03-29 DIAGNOSIS — C8400 Mycosis fungoides, unspecified site: Secondary | ICD-10-CM | POA: Diagnosis not present

## 2014-03-29 NOTE — Patient Instructions (Signed)
Return visit 6 months  Lab 1 week before visit 

## 2014-03-30 ENCOUNTER — Ambulatory Visit: Payer: Medicare Other | Admitting: Oncology
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
## 2014-04-22 ENCOUNTER — Encounter (HOSPITAL_COMMUNITY): Payer: Self-pay | Admitting: Cardiovascular Disease

## 2014-06-18 ENCOUNTER — Other Ambulatory Visit: Payer: Self-pay | Admitting: Oncology

## 2014-06-18 DIAGNOSIS — D693 Immune thrombocytopenic purpura: Secondary | ICD-10-CM

## 2014-06-18 DIAGNOSIS — C8400 Mycosis fungoides, unspecified site: Secondary | ICD-10-CM

## 2014-06-18 DIAGNOSIS — D696 Thrombocytopenia, unspecified: Secondary | ICD-10-CM

## 2014-06-24 ENCOUNTER — Encounter: Payer: Self-pay | Admitting: *Deleted

## 2014-07-02 DIAGNOSIS — J018 Other acute sinusitis: Secondary | ICD-10-CM | POA: Diagnosis not present

## 2014-07-02 DIAGNOSIS — R490 Dysphonia: Secondary | ICD-10-CM | POA: Diagnosis not present

## 2014-07-29 DIAGNOSIS — L821 Other seborrheic keratosis: Secondary | ICD-10-CM | POA: Diagnosis not present

## 2014-07-29 DIAGNOSIS — L259 Unspecified contact dermatitis, unspecified cause: Secondary | ICD-10-CM | POA: Diagnosis not present

## 2014-07-29 DIAGNOSIS — B353 Tinea pedis: Secondary | ICD-10-CM | POA: Diagnosis not present

## 2014-07-29 DIAGNOSIS — B351 Tinea unguium: Secondary | ICD-10-CM | POA: Diagnosis not present

## 2014-07-29 DIAGNOSIS — D2271 Melanocytic nevi of right lower limb, including hip: Secondary | ICD-10-CM | POA: Diagnosis not present

## 2014-07-29 DIAGNOSIS — Z85828 Personal history of other malignant neoplasm of skin: Secondary | ICD-10-CM | POA: Diagnosis not present

## 2014-08-09 ENCOUNTER — Other Ambulatory Visit: Payer: Medicare Other

## 2014-08-12 ENCOUNTER — Encounter: Payer: Self-pay | Admitting: Family Medicine

## 2014-08-12 ENCOUNTER — Ambulatory Visit (INDEPENDENT_AMBULATORY_CARE_PROVIDER_SITE_OTHER): Payer: Medicare Other | Admitting: Family Medicine

## 2014-08-12 VITALS — BP 126/70 | HR 78 | Temp 97.5°F | Wt 139.0 lb

## 2014-08-12 DIAGNOSIS — N400 Enlarged prostate without lower urinary tract symptoms: Secondary | ICD-10-CM | POA: Diagnosis not present

## 2014-08-12 DIAGNOSIS — E785 Hyperlipidemia, unspecified: Secondary | ICD-10-CM | POA: Diagnosis not present

## 2014-08-12 DIAGNOSIS — I10 Essential (primary) hypertension: Secondary | ICD-10-CM

## 2014-08-12 DIAGNOSIS — Z0000 Encounter for general adult medical examination without abnormal findings: Secondary | ICD-10-CM

## 2014-08-12 LAB — LIPID PANEL
CHOL/HDL RATIO: 2
Cholesterol: 113 mg/dL (ref 0–200)
HDL: 61.4 mg/dL (ref >39.00)
LDL Cholesterol: 38 mg/dL (ref 0–99)
NonHDL: 51.6
Triglycerides: 68 mg/dL (ref 0.0–149.0)
VLDL: 13.6 mg/dL (ref 0.0–40.0)

## 2014-08-12 MED ORDER — DOXYCYCLINE HYCLATE 100 MG PO CAPS: 100.0000 mg | ORAL_CAPSULE | Freq: Two times a day (BID) | ORAL | Status: DC

## 2014-08-12 MED ORDER — ALPRAZOLAM 0.5 MG PO TABS
0.5000 mg | ORAL_TABLET | Freq: Two times a day (BID) | ORAL | Status: DC | PRN
Start: 2014-08-12 — End: 2014-09-16

## 2014-08-12 NOTE — Patient Instructions (Signed)
Continue with yearly flu vaccine We will call you with lab results. Your tetanus is good for another 5 years.

## 2014-08-12 NOTE — Progress Notes (Signed)
Subjective:    Patient ID: [name], male    DOB: 01-28-1941, 74 y.o.   MRN: [id number]  HPI   Patient seen for Medicare wellness exam and medical follow-up. His other chronic problems include history of SVT, chronic insomnia, cutaneous T-cell lymphoma, history of ITP, hypertension, hyperlipidemia. He is followed regularly by dermatology, cardiology, and oncology.  He requests PSA. Uncle died of prostate cancer. He has history of BPH. Takes [employer] and. symptomatically stable. Hyperlipidemia treated with atorvastatin. Has not had recent lipid panel. He gets regular CBC and comprehensive metabolic panel per oncology. Has been on chronic low-dose alprazolam for many years. Requesting refills.  Immunizations reviewed. Cannot take shingles vaccine with history of cutaneous T-cell lymphoma. Other immunizations up-to-date. Colonoscopy up-to-date.  Past Medical History  Diagnosis Date  . Allergy   . CAD (coronary artery disease)   . Hyperlipidemia   . Diverticulosis of colon   . Benign prostatic hypertrophy   . Anxiety   . History of colon polyps   . Depression   . Kidney stones     "I've had them twice; last time was ~ 9 yr ago; both times passed on their own" (10/21/2012)  . Pneumonia     "I think I had as a child" (10/21/2012)  . ITP (idiopathic thrombocytopenic purpura)   . Factor VII deficiency   . History of blood transfusion 1941  . Arthritis     "all over" (2012/10/21)  . Chronic lower back pain     "from a herniated disc" (Oct 21, 2012)  . Myocardial infarction 2009  . T-cell lymphoma     "I'm in stage I; original site was on my left chest; had a place around my waist treated last summer; got a place on back of my left leg" (2012-10-21)  . Squamous carcinoma     "left side; top of head" (2012-10-21)  . Basal cell carcinoma of head     "right side; top of head" (2012-10-21)   Past Surgical History  Procedure Laterality Date  . Vasectomy    . Cataract extraction w/  intraocular lens  implant, bilateral Bilateral 2000's  . Mohs surgery Bilateral 2011-2012    "top of my head" (10-21-2012)  . Colonoscopy    . Coronary angioplasty with stent placement  05/29/2007    "1" (10/21/12)  . Cardiac catheterization  10-21-2012  . Tonsillectomy  ~ 1945  . Cyst excision  1980's?    "or fatty tumor; taken off my back" (2012-10-21)  . Lumbar epidural injection  ~ 2004  . Left heart catheterization with coronary angiogram N/A October 21, 2012    Procedure: LEFT HEART CATHETERIZATION WITH CORONARY ANGIOGRAM;  Surgeon: Burnell Blanks, MD;  Location[facility] CATH LAB;  Service: Cardiovascular;  Laterality: N/A;    reports that he has never smoked. He has never used smokeless tobacco. He reports that he does not drink alcohol or use illicit drugs. family history includes Alzheimer's disease in his mother; COPD in his brother; Colon cancer in his paternal uncle; Coronary artery disease in his brother; Diabetes in his brother and brother; Lymphoma in his father. Allergies  Allergen Reactions  . Other Other (See Comments)    Antidepressants...has a variety of reactions to different antidepressants  . Prozac [Fluoxetine Hcl] Other (See Comments)    nightmares   1.  Risk factors based on Past Medical , Social, and Family history reviewed and as indicated above with no changes 2.  Limitations in physical activities None.  No  recent falls. 3.  Depression/mood No active depression or anxiety issues 4.  Hearing No defiits 5.  ADLs independent in all. 6.  Cognitive function (orientation to time and place, language, writing, speech,memory) no short or long term memory issues.  Language and judgement intact. 7.  Home Safety no issues 8.  Height, weight, and visual acuity.all stable. 9.  Counseling discussed weight bearing exercise 10. Recommendation of preventive services. Continue with yearly flu vaccine. Zostavax contraindicated.  11. Labs based on risk factors PSA, lipid 12. Care  Plan as above. 13. Other Providers Dr Beryle Beams (Oncology), Dr Delman Cheadle (Dermatology), Dr Angelena Form (cardiology),  14. Written schedule of screening/prevention services given to patient.    Review of Systems  Constitutional: Negative for fever, activity change, appetite change and fatigue.  HENT: Negative for congestion, ear pain and trouble swallowing.   Eyes: Negative for pain and visual disturbance.  Respiratory: Negative for cough, shortness of breath and wheezing.   Cardiovascular: Negative for chest pain and palpitations.  Gastrointestinal: Negative for nausea, vomiting, abdominal pain, diarrhea, constipation, blood in stool, abdominal distention and rectal pain.  Genitourinary: Negative for dysuria, hematuria and testicular pain.  Musculoskeletal: Negative for joint swelling and arthralgias.  Skin: Negative for rash.  Neurological: Negative for dizziness, syncope and headaches.  Hematological: Negative for adenopathy.  Psychiatric/Behavioral: Negative for confusion and dysphoric mood.       Objective:   Physical Exam  Constitutional: He is oriented to person, place, and time. He appears well-developed and well-nourished. No distress.  HENT:  Head: Normocephalic and atraumatic.  Right Ear: External ear normal.  Left Ear: External ear normal.  Mouth/Throat: Oropharynx is clear and moist.  Eyes: Conjunctivae and EOM are normal. Pupils are equal, round, and reactive to light.  Neck: Normal range of motion. Neck supple. No thyromegaly present.  Cardiovascular: Normal rate, regular rhythm and normal heart sounds.   No murmur heard. Pulmonary/Chest: No respiratory distress. He has no wheezes. He has no rales.  Abdominal: Soft. Bowel sounds are normal. He exhibits no distension and no mass. There is no tenderness. There is no rebound and no guarding.  Genitourinary: Rectum normal and prostate normal.  Musculoskeletal: He exhibits no edema.  Lymphadenopathy:    He has no cervical  adenopathy.  Neurological: He is alert and oriented to person, place, and time. He displays normal reflexes. No cranial nerve deficit.  Skin: No rash noted.  Psychiatric: He has a normal mood and affect.          Assessment & Plan:  #1 Medicare wellness exam. Continue yearly flu vaccine. Other immunizations up-to-date. Patient requesting repeat PSA # 2 hyperlipidemia. Check lipid panel. #3 chronic insomnia. Refill low-dose alprazolam which he has been on for many years

## 2014-08-12 NOTE — Progress Notes (Signed)
Pre visit review using our clinic review tool, if applicable. No additional management support is needed unless otherwise documented below in the visit note. 

## 2014-08-16 ENCOUNTER — Other Ambulatory Visit (INDEPENDENT_AMBULATORY_CARE_PROVIDER_SITE_OTHER): Payer: Medicare Other

## 2014-08-16 ENCOUNTER — Encounter: Payer: Medicare Other | Admitting: Oncology

## 2014-08-16 DIAGNOSIS — C8400 Mycosis fungoides, unspecified site: Secondary | ICD-10-CM

## 2014-08-16 DIAGNOSIS — D693 Immune thrombocytopenic purpura: Secondary | ICD-10-CM

## 2014-08-16 DIAGNOSIS — D696 Thrombocytopenia, unspecified: Secondary | ICD-10-CM

## 2014-08-16 LAB — COMPREHENSIVE METABOLIC PANEL
ALT: 25 U/L (ref 0–53)
AST: 21 U/L (ref 0–37)
Albumin: 3.7 g/dL (ref 3.5–5.2)
Alkaline Phosphatase: 55 U/L (ref 39–117)
BILIRUBIN TOTAL: 0.9 mg/dL (ref 0.2–1.2)
BUN: 17 mg/dL (ref 6–23)
CALCIUM: 8.7 mg/dL (ref 8.4–10.5)
CO2: 31 meq/L (ref 19–32)
Chloride: 107 mEq/L (ref 96–112)
Creat: 0.78 mg/dL (ref 0.50–1.35)
GLUCOSE: 128 mg/dL — AB (ref 70–99)
Potassium: 4.9 mEq/L (ref 3.5–5.3)
SODIUM: 141 meq/L (ref 135–145)
TOTAL PROTEIN: 5.4 g/dL — AB (ref 6.0–8.3)

## 2014-08-16 LAB — CBC WITH DIFFERENTIAL/PLATELET
BASOS ABS: 0 10*3/uL (ref 0.0–0.1)
BASOS PCT: 0 % (ref 0–1)
Eosinophils Absolute: 0.1 10*3/uL (ref 0.0–0.7)
Eosinophils Relative: 3 % (ref 0–5)
HCT: 44.4 % (ref 39.0–52.0)
Hemoglobin: 14.7 g/dL (ref 13.0–17.0)
Lymphocytes Relative: 35 % (ref 12–46)
Lymphs Abs: 1.1 10*3/uL (ref 0.7–4.0)
MCH: 33.3 pg (ref 26.0–34.0)
MCHC: 33.1 g/dL (ref 30.0–36.0)
MCV: 100.7 fL — ABNORMAL HIGH (ref 78.0–100.0)
MONO ABS: 0.2 10*3/uL (ref 0.1–1.0)
MONOS PCT: 5 % (ref 3–12)
Neutro Abs: 1.8 10*3/uL (ref 1.7–7.7)
Neutrophils Relative %: 57 % (ref 43–77)
Platelets: 72 10*3/uL — ABNORMAL LOW (ref 150–400)
RBC: 4.41 MIL/uL (ref 4.22–5.81)
RDW: 12.8 % (ref 11.5–15.5)
WBC: 3.2 10*3/uL — ABNORMAL LOW (ref 4.0–10.5)

## 2014-08-16 LAB — SAVE SMEAR

## 2014-08-16 LAB — LACTATE DEHYDROGENASE: LDH: 128 U/L (ref 94–250)

## 2014-08-17 ENCOUNTER — Other Ambulatory Visit: Payer: Self-pay | Admitting: Oncology

## 2014-08-17 ENCOUNTER — Telehealth: Payer: Self-pay | Admitting: *Deleted

## 2014-08-17 DIAGNOSIS — D696 Thrombocytopenia, unspecified: Secondary | ICD-10-CM

## 2014-08-17 DIAGNOSIS — D61818 Other pancytopenia: Secondary | ICD-10-CM

## 2014-08-17 DIAGNOSIS — D539 Nutritional anemia, unspecified: Secondary | ICD-10-CM

## 2014-08-17 NOTE — Telephone Encounter (Signed)
Pt called / informed "blood counts lower than his baseline" and he will discuss this with him on Monday;repeat to be sure this is not a lab error per Dr Beryle Beams. Stated he had looked at his platelet count this morning and noticed it's lower than it has ever been.

## 2014-08-17 NOTE — Telephone Encounter (Signed)
-----   Message from Annia Belt, MD sent at 08/16/2014  8:46 PM EDT ----- Call pt  Blood counts lower than his baseline  I will discuss at time of his visit next week & repeat to make sure not a lab error

## 2014-08-23 ENCOUNTER — Telehealth: Payer: Self-pay | Admitting: Family Medicine

## 2014-08-23 ENCOUNTER — Ambulatory Visit (INDEPENDENT_AMBULATORY_CARE_PROVIDER_SITE_OTHER): Payer: Medicare Other | Admitting: Oncology

## 2014-08-23 ENCOUNTER — Encounter: Payer: Self-pay | Admitting: Oncology

## 2014-08-23 VITALS — BP 128/66 | HR 57 | Temp 97.8°F | Ht 65.0 in | Wt 141.8 lb

## 2014-08-23 DIAGNOSIS — D696 Thrombocytopenia, unspecified: Secondary | ICD-10-CM | POA: Diagnosis not present

## 2014-08-23 DIAGNOSIS — D539 Nutritional anemia, unspecified: Secondary | ICD-10-CM | POA: Diagnosis not present

## 2014-08-23 DIAGNOSIS — C8400 Mycosis fungoides, unspecified site: Secondary | ICD-10-CM

## 2014-08-23 DIAGNOSIS — D61818 Other pancytopenia: Secondary | ICD-10-CM

## 2014-08-23 LAB — CBC WITH DIFFERENTIAL/PLATELET
BASOS ABS: 0 10*3/uL (ref 0.0–0.1)
Basophils Relative: 0 % (ref 0–1)
EOS ABS: 0.1 10*3/uL (ref 0.0–0.7)
EOS PCT: 2 % (ref 0–5)
HEMATOCRIT: 44.8 % (ref 39.0–52.0)
Hemoglobin: 14.7 g/dL (ref 13.0–17.0)
Lymphocytes Relative: 36 % (ref 12–46)
Lymphs Abs: 1.4 10*3/uL (ref 0.7–4.0)
MCH: 33 pg (ref 26.0–34.0)
MCHC: 32.8 g/dL (ref 30.0–36.0)
MCV: 100.7 fL — ABNORMAL HIGH (ref 78.0–100.0)
MONO ABS: 0.3 10*3/uL (ref 0.1–1.0)
Monocytes Relative: 7 % (ref 3–12)
Neutro Abs: 2.1 10*3/uL (ref 1.7–7.7)
Neutrophils Relative %: 55 % (ref 43–77)
Platelets: 88 10*3/uL — ABNORMAL LOW (ref 150–400)
RBC: 4.45 MIL/uL (ref 4.22–5.81)
RDW: 12.8 % (ref 11.5–15.5)
WBC: 3.8 10*3/uL — ABNORMAL LOW (ref 4.0–10.5)

## 2014-08-23 LAB — VITAMIN B12: Vitamin B-12: 598 pg/mL (ref 211–911)

## 2014-08-23 LAB — SAVE SMEAR

## 2014-08-23 LAB — FOLATE: Folate: 17.6 ng/mL

## 2014-08-23 NOTE — Progress Notes (Signed)
Patient ID: [name], male   DOB: Jun 11, 1940, 74 y.o.   MRN: [id number] Hematology and Oncology Follow Up Visit  [name] [id number] [birth year] 74 y.o. 08/23/2014 12:21 PM   Principle Diagnosis: Encounter Diagnoses  Name Primary?  . Thrombocytopenia   . Anemia, macrocytic   . Other pancytopenia   . Mycosis fungoides, stage 1 Yes   clinical summary: 74 year old man initially diagnosed with ITP in November 2009. Platelet counts have been consistently over 100,000 until 2016 1 platelets dropped to 72,000 on April 4. Repeat value one week later 88,000.Marland Kitchen He has never required any specific treatment. He developed a patchy rash over a 4 cm area, left pectoral region of his chest, which was there for about 7 years. He changed dermatologists. He was evaluated by Dr. Jari Pigg. 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 He was referred to Dr. Adelene Idler at [facility]. Repeat biopsies were confirmatory. He was treated with topical clobetasol ointment with complete regression of the rash. He has had no recurrent areas of involvement. I obtained baseline CAT scans in August of 2012 and there was no organomegaly or lymphadenopathy. However, a lesion was seen on his scalp which was subsequently excised on 05/31/2011 and showed poorly differentiated squamous cell carcinoma. This appeared to be a localized process and not a metastasis. He has a history of a tubular adenoma removed at time of a colonoscopy 09/04/2011 by Dr. Oretha Caprice.   Interim History:   He seems to get a persistent bronchitis about once a year. He had another episode in February of this year and was on antibiotics for 20 days. He thought that this was less severe than the episode he had last year when he was on antibiotics for 40 days. He has had no other interim medical problems. He is still devoting a lot of time to his wife who got injured in a car accident last year.  She is still requiring rehabilitation. He does report that on occasion his right hand is shaky. This does not seem to happen all the time and does not seem to be getting more severe or frequent. He has not noted any new areas of skin rash. Other than being tired from taking care of his wife, a performance status remains normal. Appetite is good and weight is stable.  Medications: reviewed  Allergies:  Allergies  Allergen Reactions  . Other Other (See Comments)    Antidepressants...has a variety of reactions to different antidepressants  . Prozac [Fluoxetine Hcl] Other (See Comments)    nightmares    Review of Systems: See history of present illness Remaining ROS negative:   Physical Exam: Blood pressure 128/66, pulse 57, temperature 97.8 F (36.6 C), temperature source Oral, height 5\' 5"  (1.651 m), weight 141 lb 12.8 oz (64.32 kg), SpO2 100 %. Wt Readings from Last 3 Encounters:  08/23/14 141 lb 12.8 oz (64.32 kg)  08/12/14 139 lb (63.05 kg)  03/29/14 138 lb 12.8 oz (62.959 kg)     General appearance: Thin Caucasian man HENNT: Pharynx no erythema, exudate, mass, or ulcer. No thyromegaly or thyroid nodules Lymph nodes: No cervical, supraclavicular, or axillary lymphadenopathy Breasts: Lungs: Clear to auscultation, resonant to percussion throughout Heart: Regular rhythm, no murmur, no gallop, no rub, no click, no edema Abdomen: Soft, nontender, normal bowel sounds, no mass, no organomegaly Extremities: No edema, no calf tenderness Musculoskeletal: no joint deformities GU:  Vascular: Carotid  pulses 2+, no bruits, distal pulses: Dorsalis pedis 1+ symmetric Neurologic: Alert, oriented, PERRLA,  cranial nerves grossly normal, motor strength 5 over 5, reflexes 1+ symmetric, upper body coordination normal, gait normal, no resting tremor. No intention tremor. Normal finger to finger, rapid alternating movements, and finger to nose exam. Skin: No rash; chronic ecchymosis dorsa of both  hands. No new areas of involvement with cutaneous lymphoma  Lab Results: CBC W/Diff    Component Value Date/Time   WBC 3.8* 08/23/2014 0940   WBC 5.2 03/23/2013 1424   RBC 4.45 08/23/2014 0940   RBC 4.54 03/23/2013 1424   HGB 14.7 08/23/2014 0940   HGB 14.6 03/23/2013 1424   HCT 44.8 08/23/2014 0940   HCT 45.4 03/23/2013 1424   PLT 88* 08/23/2014 0940   PLT 108* 03/23/2013 1424   MCV 100.7* 08/23/2014 0940   MCV 100.0* 03/23/2013 1424   MCH 33.0 08/23/2014 0940   MCH 32.3 03/23/2013 1424   MCHC 32.8 08/23/2014 0940   MCHC 32.3 03/23/2013 1424   RDW 12.8 08/23/2014 0940   RDW 12.7 03/23/2013 1424   LYMPHSABS 1.4 08/23/2014 0940   LYMPHSABS 1.1 03/23/2013 1424   MONOABS 0.3 08/23/2014 0940   MONOABS 0.4 03/23/2013 1424   EOSABS 0.1 08/23/2014 0940   EOSABS 0.1 03/23/2013 1424   BASOSABS 0.0 08/23/2014 0940   BASOSABS 0.0 03/23/2013 1424     Chemistry      Component Value Date/Time   NA 141 08/16/2014 1024   NA 142 03/23/2013 1424   NA 140 01/10/2011 1442   K 4.9 08/16/2014 1024   K 4.7 03/23/2013 1424   K 4.6 01/10/2011 1442   CL 107 08/16/2014 1024   CL 106 09/22/2012 1428   CL 102 01/10/2011 1442   CO2 31 08/16/2014 1024   CO2 28 03/23/2013 1424   CO2 26 01/10/2011 1442   BUN 17 08/16/2014 1024   BUN 18.1 03/23/2013 1424   BUN 19 01/10/2011 1442   CREATININE 0.78 08/16/2014 1024   CREATININE 0.8 03/23/2013 1424   CREATININE 1.0 10/02/2012 0945      Component Value Date/Time   CALCIUM 8.7 08/16/2014 1024   CALCIUM 9.4 03/23/2013 1424   CALCIUM 8.7 01/10/2011 1442   ALKPHOS 55 08/16/2014 1024   ALKPHOS 62 03/23/2013 1424   AST 21 08/16/2014 1024   AST 25 03/23/2013 1424   ALT 25 08/16/2014 1024   ALT 27 03/23/2013 1424   BILITOT 0.9 08/16/2014 1024   BILITOT 0.86 03/23/2013 1424       Radiological Studies: No results found.  Impression:  #1. 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 Both white count and platelets improving on a one-week interval sample. No new medications. No infection since February. I have no immediate concern. We will continue to monitor his labs over time. I will review his peripheral blood film.  #2. Stage I mycosis fungoides Minimal skin involvement treated with local treatment and no evidence of recurrence at this time.  #3. Coronary artery disease status post MI January 2009 status post angioplasty and stent to the LAD  #4. Squamous cell carcinoma of the scalp treated with primary excision  #5. Tubular adenoma of the colon resected continue surveillance colonoscopies. Last procedure done April 2013.   CC: Patient Care Team: Eulas Post, MD as PCP - General (  Family Medicine) Annia Belt, MD as Consulting Physician (Oncology) Josue Hector, MD as Consulting Physician (Cardiology) Jari Pigg, MD as Consulting Physician (Dermatology) Renaldo Harrison, MD as Referring Physician (Dermatology)   Annia Belt, MD 4/11/201612:21 PM

## 2014-08-23 NOTE — Telephone Encounter (Signed)
Pt calling for results of PSA , but I do not see it was done. Pt states it is not coming up on my chart. pls advise if I need to schedule.

## 2014-08-23 NOTE — Telephone Encounter (Signed)
Need to schedule appointment.

## 2014-08-23 NOTE — Patient Instructions (Signed)
Lab 02/22/15 MD visit 1-2 weeks after lab

## 2014-08-24 ENCOUNTER — Other Ambulatory Visit: Payer: Self-pay | Admitting: Oncology

## 2014-08-24 DIAGNOSIS — D693 Immune thrombocytopenic purpura: Secondary | ICD-10-CM

## 2014-08-24 NOTE — Telephone Encounter (Signed)
Pt will cb and schedule. Pt very busy w/ wife's appts this week

## 2014-08-25 ENCOUNTER — Telehealth: Payer: Self-pay | Admitting: *Deleted

## 2014-08-25 ENCOUNTER — Telehealth: Payer: Self-pay | Admitting: Cardiovascular Disease

## 2014-08-25 NOTE — Telephone Encounter (Signed)
Recent labs are ok in Dr. Azucena Freed office and we do not need anything else right now. [name]

## 2014-08-25 NOTE — Telephone Encounter (Signed)
New message      Pt is due for labs in April.  Dr Beryle Beams did labs recently.  Will he still need to come in for more lab work?

## 2014-08-25 NOTE — Telephone Encounter (Signed)
Pt called / informed E15 and Folic acid levels are normal; Platelet count is 88,000 and to repeat CBC in 2 months per Dr Beryle Beams.

## 2014-08-25 NOTE — Telephone Encounter (Signed)
-----   Message from Annia Belt, MD sent at 08/24/2014  2:12 PM EDT ----- Call pt: I71 & folic acid levels are normal.  Platelet count 88,000 - let's repeat CBC in 2 months

## 2014-08-25 NOTE — Telephone Encounter (Signed)
Pt states that Dr. Beryle Beams drew several labs last week and this past Monday and is also requesting they be redrawn in 2 months. Pt states that he has a lab appt with our office at the end of this month and was wondering if he needed to have labs drawn or if the ones from Dr. Azucena Freed office would be ok? Informed pt that I would route this information over to Dr. Angelena Form to review just to make sure that there aren't labs that Dr. Angelena Form wants that Dr. Beryle Beams didn't draw.  Pt verbalized understanding and was in agreement with this plan.

## 2014-08-26 NOTE — Telephone Encounter (Signed)
Pt notified and lab work appt scheduled for April 28,2016 cancelled.

## 2014-08-27 NOTE — Telephone Encounter (Signed)
Counts were already improving on repeat so I don't think he needs to [place].  Lab changes unlikely to be related to any change in his vision.

## 2014-08-27 NOTE — Telephone Encounter (Signed)
[place]; he wants to know when he comes June 13 for labs, should he stay here in [place] for a couple days Until the results are back in case you need to see him. Also he forgot to tell u about having vision problems - blurred vision. Could it be related to his blood counts? Thanks

## 2014-08-27 NOTE — Telephone Encounter (Signed)
Pt called & informed.

## 2014-08-30 DIAGNOSIS — H01001 Unspecified blepharitis right upper eyelid: Secondary | ICD-10-CM | POA: Diagnosis not present

## 2014-08-30 DIAGNOSIS — H52202 Unspecified astigmatism, left eye: Secondary | ICD-10-CM | POA: Diagnosis not present

## 2014-08-30 DIAGNOSIS — H43813 Vitreous degeneration, bilateral: Secondary | ICD-10-CM | POA: Diagnosis not present

## 2014-08-30 DIAGNOSIS — H01004 Unspecified blepharitis left upper eyelid: Secondary | ICD-10-CM | POA: Diagnosis not present

## 2014-09-02 ENCOUNTER — Other Ambulatory Visit: Payer: Medicare Other

## 2014-09-02 LAB — PSA: PSA: 1.34 ng/mL (ref 0.10–4.00)

## 2014-09-03 ENCOUNTER — Encounter: Payer: Self-pay | Admitting: Family Medicine

## 2014-09-09 ENCOUNTER — Other Ambulatory Visit: Payer: Medicare Other

## 2014-09-15 NOTE — Progress Notes (Signed)
Chief Complaint  Patient presents with  . Coronary Artery Disease   History of Present Illness: 74 yo WM with history of CAD, HTN, hyperlipidemia, idiopathic thrombocytopenia and OSA here today for cardiac follow up. In January of 2009 he had an anterior MI treated with a drug-eluting stent in the LAD. His ejection fraction improved from 25% to 40-50% following the first event. No evidence of AAA on abdominal aortic u/s December 2013. He has been diagnosed with T-cell lymphoma but this is in remission. We arranged a stress myoview on 09/25/12. He exercised for over 10 minutes with no chest pain but his stress test suggested scar in the anterior wall, apex and inferoapical wall with LVEF of 55%. NO ischemic EKG changes. Cardiac cath 10-19-2012 with mild to moderate CAD, no obstructive lesions.   He is here today for follow up. He has been feeling well overall but he is constantly tired. No chest pain or SOB.   Primary Care Physician: Carolann Littler  Last Lipid Profile:Lipid Panel     Component Value Date/Time   CHOL 113 08/12/2014 0952   TRIG 68.0 08/12/2014 0952   HDL 61.40 08/12/2014 0952   CHOLHDL 2 08/12/2014 0952   VLDL 13.6 08/12/2014 0952   LDLCALC 38 08/12/2014 0952     Past Medical History  Diagnosis Date  . Allergy   . CAD (coronary artery disease)   . Hyperlipidemia   . Diverticulosis of colon   . Benign prostatic hypertrophy   . Anxiety   . History of colon polyps   . Depression   . Kidney stones     "I've had them twice; last time was ~ 9 yr ago; both times passed on their own" (10-19-12)  . Pneumonia     "I think I had as a child" (10-19-2012)  . ITP (idiopathic thrombocytopenic purpura)   . Factor VII deficiency   . History of blood transfusion 1941  . Arthritis     "all over" (10/19/2012)  . Chronic lower back pain     "from a herniated disc" (10-19-12)  . Myocardial infarction 2009  . T-cell lymphoma     "I'm in stage I; original site was on my left  chest; had a place around my waist treated last summer; got a place on back of my left leg" (October 19, 2012)  . Squamous carcinoma     "left side; top of head" (10-19-2012)  . Basal cell carcinoma of head     "right side; top of head" (10-19-2012)    Past Surgical History  Procedure Laterality Date  . Vasectomy    . Cataract extraction w/ intraocular lens  implant, bilateral Bilateral 2000's  . Mohs surgery Bilateral 2011-2012    "top of my head" (10-19-2012)  . Colonoscopy    . Coronary angioplasty with stent placement  05/29/2007    "1" (19-Oct-2012)  . Cardiac catheterization  10/19/12  . Tonsillectomy  ~ 1945  . Cyst excision  1980's?    "or fatty tumor; taken off my back" (10/19/12)  . Lumbar epidural injection  ~ 2004  . Left heart catheterization with coronary angiogram N/A 10/19/12    Procedure: LEFT HEART CATHETERIZATION WITH CORONARY ANGIOGRAM;  Surgeon: Burnell Blanks, MD;  Location: [facility] CATH LAB;  Service: Cardiovascular;  Laterality: N/A;    Current Outpatient Prescriptions  Medication Sig Dispense Refill  . acetaminophen (TYLENOL) 650 MG CR tablet Take 650 mg by mouth 2 (two) times daily.     Marland Kitchen ALPRAZolam Duanne Moron)  0.5 MG tablet Take 0.5 mg by mouth at bedtime as needed for anxiety (for sleep).    Marland Kitchen aspirin 81 MG tablet Take 81 mg by mouth daily.      Marland Kitchen atorvastatin (LIPITOR) 20 MG tablet Take 1 tablet (20 mg total) by mouth daily. 90 tablet 3  . carvedilol (COREG) 3.125 MG tablet Take 1 tablet (3.125 mg total) by mouth 2 (two) times daily with a meal. 180 tablet 3  . Cholecalciferol (VITAMIN D3) 1000 UNITS tablet Take 1,000 Units by mouth 2 (two) times daily.     . Cyanocobalamin (VITAMIN B 12 PO) Take 500 mg by mouth daily.    Marland Kitchen doxycycline (VIBRAMYCIN) 100 MG capsule Take 1 capsule (100 mg total) by mouth 2 (two) times daily. 20 capsule 0  . fluticasone (FLONASE) 50 MCG/ACT nasal spray Place 2 sprays into both nostrils daily.    Marland Kitchen FOLIC ACID PO Take 754 mg by mouth  daily.    Marland Kitchen losartan (COZAAR) 50 MG tablet Take 1 tablet (50 mg total) by mouth daily. 90 tablet 3  . Magnesium 250 MG TABS Take 250 mg by mouth daily.     . nitroGLYCERIN (NITROSTAT) 0.4 MG SL tablet Place 0.4 mg under the tongue every 5 (five) minutes as needed for chest pain (for chest pain).    . Omega-3 Fatty Acids (FISH OIL) 1000 MG CAPS Take 1,000 mg by mouth 2 (two) times daily.     . Saw Palmetto 450 MG CAPS Take 450 mg by mouth 2 (two) times daily.     Marland Kitchen Ubiquinol 100 MG CAPS Take 1 capsule by mouth daily.       No current facility-administered medications for this visit.    Allergies  Allergen Reactions  . Other Other (See Comments)    Antidepressants...has a variety of reactions to different antidepressants  . Prozac [Fluoxetine Hcl] Other (See Comments)    nightmares    History   Social History  . Marital Status: Married    Spouse Name: N/A  . Number of Children: 2  . Years of Education: N/A   Occupational History  . Retired    Social History Main Topics  . Smoking status: Never Smoker   . Smokeless tobacco: Never Used  . Alcohol Use: No  . Drug Use: No  . Sexual Activity: Not Currently   Other Topics Concern  . Not on file   Social History Narrative   No caffeine     Family History  Problem Relation Age of Onset  . Alzheimer's disease Mother   . Lymphoma Father   . Coronary artery disease Brother   . COPD Brother   . Diabetes Brother   . Colon cancer Paternal Uncle   . Diabetes Brother     Review of Systems:  As stated in the HPI and otherwise negative.   BP 128/76 mmHg  Pulse 54  Ht 5\' 6"  (1.676 m)  Wt 140 lb 6.4 oz (63.685 kg)  BMI 22.67 kg/m2  Physical Examination: General: Well developed, well nourished, NAD HEENT: OP clear, mucus membranes moist SKIN: warm, dry. No rashes. Neuro: No focal deficits Musculoskeletal: Muscle strength 5/5 all ext Psychiatric: Mood and affect normal Neck: No JVD, no carotid bruits, no thyromegaly, no  lymphadenopathy. Lungs:Clear bilaterally, no wheezes, rhonci, crackles Cardiovascular: Regular rate and rhythm. No murmurs, gallops or rubs. Abdomen:Soft. Bowel sounds present. Non-tender.  Extremities: No lower extremity edema. Pulses are 2 + in the bilateral DP/PT.  Stress myoview  09/25/12:  Rest Procedure: Myocardial perfusion imaging was performed at rest 45 minutes following the intravenous administration of Technetium 26m Sestamibi.  Rest ECG: NSR old anterior MI  Stress Procedure: The patient exercised on the treadmill utilizing the Bruce Protocol for 10:15 minutes. The patient stopped due to fatigue, and chest tightness 4/10. This patient had rare pacs/pvcs with exercise.Technetium 67m Sestamibi was injected at peak exercise and myocardial perfusion imaging was performed after a brief delay.  Stress ECG: No significant change from baseline ECG  QPS  Raw Data Images: Normal; no motion artifact; normal heart/lung ratio.  Stress Images: There is decreased uptake in the anterior wall.  Rest Images: There is decreased uptake in the anterior wall.  Subtraction (SDS): There is a fixed defect that is most consistent with a previous infarction.  Transient Ischemic Dilatation (Normal <1.22): 0.92  Lung/Heart Ratio (Normal <0.45): 0.21  Quantitative Gated Spect Images  QGS EDV: 93 ml  QGS ESV: 42 ml  Impression  Exercise Capacity: Excellent exercise capacity.  BP Response: Normal blood pressure response.  Clinical Symptoms: Chest tightness  ECG Impression: No significant ST segment change suggestive of ischemia.  Comparison with Prior Nuclear Study: No images to compare  Overall Impression: Intermediate risk stress nuclear study Moderate mid and distal anterior wall, apical and inferoapical wall infarct with no ischemia.  LV Ejection Fraction: 55%. LV Wall Motion: EF reported as normal but clearly there is an anteroapical wall motion abnormality. Suggest MRI to quantitate and assess scar  burden   Cardiac cath may 2014: Left main: No obstructive disease.  Left Anterior Descending Artery: Large caliber vessel that courses to the apex. The proximal vessel has mild plaque disease. The mid stented segment is patent. The small caliber diagonal branch is patent with ostial 70% stenosis as it is jailed by the stent and unchanged in appearance.  Circumflex Artery: Moderate caliber vessel with moderate caliber obtuse marginal branch with no significant disease. The AV groove Circumflex has a 40% stenosis just after the takeoff of the OM branch, unchanged.  Right Coronary Artery: Large, dominant vessel with serial 20% lesions throughout the proximal vessel. The mid vessel has a 40% stenosis. The distal vessel has mild plaque disease.  Left Ventricular Angiogram: LVEF 55% with anterior and apical hypokinesis.   EKG:  EKG is not ordered today. The ekg ordered today demonstrates   Recent Labs: 08/16/2014: ALT 25; BUN 17; Creatinine 0.78; Potassium 4.9; Sodium 141 08/23/2014: Hemoglobin 14.7; Platelets 88*   Lipid Panel    Component Value Date/Time   CHOL 113 08/12/2014 0952   TRIG 68.0 08/12/2014 0952   HDL 61.40 08/12/2014 0952   CHOLHDL 2 08/12/2014 0952   VLDL 13.6 08/12/2014 0952   LDLCALC 38 08/12/2014 0952   LDLDIRECT 137.3 08/08/2006 1059     Wt Readings from Last 3 Encounters:  09/16/14 140 lb 6.4 oz (63.685 kg)  08/23/14 141 lb 12.8 oz (64.32 kg)  08/12/14 139 lb (63.05 kg)     Other studies Reviewed: Additional studies/ records that were reviewed today include: . Review of the above records demonstrates:   Assessment and Plan:   1. CAD: Stable. Continue current meds. His medications are ASA, Plavix, statin, beta blocker, Ace-inh. He could stop Plavix if necessary given his severe bruising.   2. HTN: BP controlled. No changes.   3. HLD: Lipids well controlled. Continue statin.   Current medicines are reviewed at length with the patient today.  The patient does  not have concerns  regarding medicines.  The following changes have been made:  no change  Labs/ tests ordered today include:  No orders of the defined types were placed in this encounter.    Disposition:   FU with me in 6 months  Signed, Lauree Chandler, MD 09/16/2014 10:25 AM    [address], [place], [place]  [address] Phone: [phone]; Fax: [phone]

## 2014-09-16 ENCOUNTER — Encounter: Payer: Self-pay | Admitting: Cardiovascular Disease

## 2014-09-16 ENCOUNTER — Ambulatory Visit (INDEPENDENT_AMBULATORY_CARE_PROVIDER_SITE_OTHER): Payer: Medicare Other | Admitting: Cardiovascular Disease

## 2014-09-16 VITALS — BP 128/76 | HR 54 | Ht 66.0 in | Wt 140.4 lb

## 2014-09-16 DIAGNOSIS — I10 Essential (primary) hypertension: Secondary | ICD-10-CM | POA: Diagnosis not present

## 2014-09-16 DIAGNOSIS — E785 Hyperlipidemia, unspecified: Secondary | ICD-10-CM | POA: Diagnosis not present

## 2014-09-16 DIAGNOSIS — I2510 Atherosclerotic heart disease of native coronary artery without angina pectoris: Secondary | ICD-10-CM

## 2014-09-16 NOTE — Patient Instructions (Signed)
Medication Instructions:  Your physician recommends that you continue on your current medications as directed. Please refer to the Current Medication list given to you today.   Labwork: none  Testing/Procedures: None   Follow-Up: Your physician wants you to follow-up in: 6 months.  You will receive a reminder letter in the mail two months in advance. If you don't receive a letter, please call our office to schedule the follow-up appointment.

## 2014-10-19 ENCOUNTER — Encounter: Payer: Self-pay | Admitting: Gastroenterology

## 2014-10-25 ENCOUNTER — Other Ambulatory Visit (INDEPENDENT_AMBULATORY_CARE_PROVIDER_SITE_OTHER): Payer: Medicare Other

## 2014-10-25 DIAGNOSIS — D696 Thrombocytopenia, unspecified: Secondary | ICD-10-CM

## 2014-10-25 DIAGNOSIS — C8400 Mycosis fungoides, unspecified site: Secondary | ICD-10-CM

## 2014-10-25 DIAGNOSIS — D693 Immune thrombocytopenic purpura: Secondary | ICD-10-CM | POA: Diagnosis not present

## 2014-10-25 LAB — CBC WITH DIFFERENTIAL/PLATELET
Basophils Absolute: 0 10*3/uL (ref 0.0–0.1)
Basophils Relative: 0 % (ref 0–1)
Eosinophils Absolute: 0.1 10*3/uL (ref 0.0–0.7)
Eosinophils Relative: 2 % (ref 0–5)
HEMATOCRIT: 45.9 % (ref 39.0–52.0)
HEMOGLOBIN: 15.2 g/dL (ref 13.0–17.0)
LYMPHS PCT: 34 % (ref 12–46)
Lymphs Abs: 1.3 10*3/uL (ref 0.7–4.0)
MCH: 32.8 pg (ref 26.0–34.0)
MCHC: 33.1 g/dL (ref 30.0–36.0)
MCV: 99.1 fL (ref 78.0–100.0)
MONOS PCT: 7 % (ref 3–12)
Monocytes Absolute: 0.3 10*3/uL (ref 0.1–1.0)
Neutro Abs: 2.2 10*3/uL (ref 1.7–7.7)
Neutrophils Relative %: 57 % (ref 43–77)
Platelets: 89 10*3/uL — ABNORMAL LOW (ref 150–400)
RBC: 4.63 MIL/uL (ref 4.22–5.81)
RDW: 12.6 % (ref 11.5–15.5)
WBC: 3.9 10*3/uL — AB (ref 4.0–10.5)

## 2014-10-25 LAB — COMPREHENSIVE METABOLIC PANEL
ALBUMIN: 3.9 g/dL (ref 3.5–5.2)
ALT: 28 U/L (ref 0–53)
AST: 26 U/L (ref 0–37)
Alkaline Phosphatase: 61 U/L (ref 39–117)
BILIRUBIN TOTAL: 1.1 mg/dL (ref 0.2–1.2)
BUN: 15 mg/dL (ref 6–23)
CO2: 30 mEq/L (ref 19–32)
Calcium: 8.9 mg/dL (ref 8.4–10.5)
Chloride: 105 mEq/L (ref 96–112)
Creat: 0.85 mg/dL (ref 0.50–1.35)
GLUCOSE: 89 mg/dL (ref 70–99)
POTASSIUM: 4.2 meq/L (ref 3.5–5.3)
SODIUM: 141 meq/L (ref 135–145)
Total Protein: 5.7 g/dL — ABNORMAL LOW (ref 6.0–8.3)

## 2014-10-25 LAB — LACTATE DEHYDROGENASE: LDH: 142 U/L (ref 94–250)

## 2014-10-25 NOTE — Addendum Note (Signed)
Addended by: Truddie Crumble on: 10/25/2014 12:02 PM   Modules accepted: Orders

## 2014-10-26 ENCOUNTER — Telehealth: Payer: Self-pay | Admitting: *Deleted

## 2014-10-26 NOTE — Telephone Encounter (Signed)
Pt called / informed platelets stable @ 89,000 per Dr Beryle Beams. Stated he had already looked at his labs on My chart this morning.

## 2014-10-26 NOTE — Telephone Encounter (Signed)
-----   Message from Annia Belt, MD sent at 10/26/2014  7:30 AM EDT ----- Call pt: platelets stable at 89,000.  Likely a little higher than what the machine is getting when we look at the blood under the microscope.

## 2014-11-08 ENCOUNTER — Other Ambulatory Visit: Payer: Self-pay

## 2014-12-06 DIAGNOSIS — E782 Mixed hyperlipidemia: Secondary | ICD-10-CM | POA: Diagnosis not present

## 2014-12-06 DIAGNOSIS — I2109 ST elevation (STEMI) myocardial infarction involving other coronary artery of anterior wall: Secondary | ICD-10-CM | POA: Diagnosis not present

## 2014-12-06 DIAGNOSIS — Z9861 Coronary angioplasty status: Secondary | ICD-10-CM | POA: Diagnosis not present

## 2014-12-15 ENCOUNTER — Telehealth: Payer: Self-pay | Admitting: Cardiovascular Disease

## 2014-12-15 NOTE — Telephone Encounter (Signed)
New Message  Pt wanted to make sure that he needed labwork before 11/2 appt. Was sched for labs on 10/31, but no lab work in syst. Please call back and discuss.

## 2014-12-15 NOTE — Telephone Encounter (Signed)
Lipids last checked in March 2016.  I spoke with pt and told him he did not lab work from our office prior to appt

## 2015-01-27 DIAGNOSIS — Z23 Encounter for immunization: Secondary | ICD-10-CM | POA: Diagnosis not present

## 2015-02-22 ENCOUNTER — Other Ambulatory Visit: Payer: Medicare Other

## 2015-03-01 ENCOUNTER — Other Ambulatory Visit: Payer: Medicare Other

## 2015-03-02 ENCOUNTER — Other Ambulatory Visit (INDEPENDENT_AMBULATORY_CARE_PROVIDER_SITE_OTHER): Payer: Medicare Other

## 2015-03-02 DIAGNOSIS — C8400 Mycosis fungoides, unspecified site: Secondary | ICD-10-CM | POA: Diagnosis present

## 2015-03-02 DIAGNOSIS — D61818 Other pancytopenia: Secondary | ICD-10-CM

## 2015-03-02 DIAGNOSIS — R739 Hyperglycemia, unspecified: Secondary | ICD-10-CM

## 2015-03-02 DIAGNOSIS — D696 Thrombocytopenia, unspecified: Secondary | ICD-10-CM

## 2015-03-02 DIAGNOSIS — D539 Nutritional anemia, unspecified: Secondary | ICD-10-CM

## 2015-03-02 LAB — COMPREHENSIVE METABOLIC PANEL
ALK PHOS: 61 U/L (ref 38–126)
ALT: 24 U/L (ref 17–63)
AST: 26 U/L (ref 15–41)
Albumin: 3.4 g/dL — ABNORMAL LOW (ref 3.5–5.0)
Anion gap: 7 (ref 5–15)
BUN: 20 mg/dL (ref 6–20)
CHLORIDE: 103 mmol/L (ref 101–111)
CO2: 25 mmol/L (ref 22–32)
Calcium: 8.7 mg/dL — ABNORMAL LOW (ref 8.9–10.3)
Creatinine, Ser: 0.86 mg/dL (ref 0.61–1.24)
GFR calc non Af Amer: >60 mL/min (ref >60)
Glucose, Bld: 154 mg/dL — ABNORMAL HIGH (ref 65–99)
Potassium: 3.7 mmol/L (ref 3.5–5.1)
Sodium: 135 mmol/L (ref 135–145)
Total Bilirubin: 1.4 mg/dL — ABNORMAL HIGH (ref 0.3–1.2)
Total Protein: 5.4 g/dL — ABNORMAL LOW (ref 6.5–8.1)

## 2015-03-02 LAB — CBC WITH DIFFERENTIAL/PLATELET
Basophils Absolute: 0 10*3/uL (ref 0.0–0.1)
Basophils Relative: 1 %
Eosinophils Absolute: 0.1 10*3/uL (ref 0.0–0.7)
Eosinophils Relative: 3 %
HCT: 45.7 % (ref 39.0–52.0)
HEMOGLOBIN: 15.1 g/dL (ref 13.0–17.0)
Lymphocytes Relative: 35 %
Lymphs Abs: 1.4 10*3/uL (ref 0.7–4.0)
MCH: 33 pg (ref 26.0–34.0)
MCHC: 33 g/dL (ref 30.0–36.0)
MCV: 99.8 fL (ref 78.0–100.0)
MONOS PCT: 6 %
Monocytes Absolute: 0.3 10*3/uL (ref 0.1–1.0)
NEUTROS PCT: 55 %
Neutro Abs: 2.3 10*3/uL (ref 1.7–7.7)
Platelets: 100 10*3/uL — ABNORMAL LOW (ref 150–400)
RBC: 4.58 MIL/uL (ref 4.22–5.81)
RDW: 12.8 % (ref 11.5–15.5)
WBC: 4.1 10*3/uL (ref 4.0–10.5)

## 2015-03-02 LAB — LACTATE DEHYDROGENASE: LDH: 173 U/L (ref 98–192)

## 2015-03-02 LAB — SEDIMENTATION RATE: Sed Rate: 0 mm/hr (ref 0–16)

## 2015-03-07 ENCOUNTER — Ambulatory Visit (INDEPENDENT_AMBULATORY_CARE_PROVIDER_SITE_OTHER): Payer: Medicare Other | Admitting: Oncology

## 2015-03-07 ENCOUNTER — Encounter: Payer: Self-pay | Admitting: Oncology

## 2015-03-07 VITALS — BP 118/62 | HR 62 | Temp 97.7°F | Ht 65.0 in | Wt 142.7 lb

## 2015-03-07 DIAGNOSIS — Z955 Presence of coronary angioplasty implant and graft: Secondary | ICD-10-CM

## 2015-03-07 DIAGNOSIS — I2510 Atherosclerotic heart disease of native coronary artery without angina pectoris: Secondary | ICD-10-CM

## 2015-03-07 DIAGNOSIS — C8400 Mycosis fungoides, unspecified site: Secondary | ICD-10-CM

## 2015-03-07 DIAGNOSIS — C8409 Mycosis fungoides, extranodal and solid organ sites: Secondary | ICD-10-CM

## 2015-03-07 DIAGNOSIS — D693 Immune thrombocytopenic purpura: Secondary | ICD-10-CM

## 2015-03-07 DIAGNOSIS — I252 Old myocardial infarction: Secondary | ICD-10-CM

## 2015-03-07 DIAGNOSIS — D696 Thrombocytopenia, unspecified: Secondary | ICD-10-CM

## 2015-03-07 NOTE — Progress Notes (Signed)
Patient ID: [name], male   DOB: 06/15/1940, 74 y.o.   MRN: [id number] Hematology and Oncology Follow Up Visit  [name] [id number] 1940/06/11 74 y.o. 03/07/2015 3:32 PM   Principle Diagnosis: Encounter Diagnoses  Name Primary?  . Thrombocytopenia ([place]) Yes  . Mycosis fungoides, stage 1 ([place])   . ITP (idiopathic thrombocytopenic purpura)   Clinical Summary: 74 year old man initially diagnosed with ITP in November 2009. Platelet counts have been consistently over 100,000 until 2016 1 platelets dropped to 72,000 on April 4. Repeat value one week later 88,000. He has never required any specific treatment. He developed a patchy rash over a 4 cm area, left pectoral region of his chest, which was there for about 7 years. He changed dermatologists. He was evaluated by Dr. Jari Pigg. 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 He was referred to [facility] Adelene Idler at [facility]. Repeat biopsies were confirmatory. He was treated with topical clobetasol ointment with complete regression of the rash. He has had no recurrent areas of involvement. I obtained baseline CAT scans in August of 2012 and there was no organomegaly or lymphadenopathy. However, a lesion was seen on his scalp which was subsequently excised on 05/31/2011 and showed poorly differentiated squamous cell carcinoma. This appeared to be a localized process and not a metastasis. He has a history of a tubular adenoma removed at time of a colonoscopy 09/04/2011 by Dr. Oretha Caprice.  Interim History:   He has had no interim medical problems. He had a recent follow-up with his dermatologist Dr. Delman Cheadle. He has noted no new areas of involvement with the mycosis fungoides. This is confirmed by my exam today. He is having problems with his right knee. He will likely need surgery. He is scheduled to see orthopedic surgery, Dr. Noemi Chapel, this week. He has had no hematuria, hematochezia, or  other areas of bleeding. Platelet count done on 03/02/2015 now back to his usual baseline of 100,000. White blood count which had dipped down to 3200 back in April currently 4100 with 55% neutrophils, 35 lymphocytes, 6 monocytes. Hemoglobin remains normal at 15.1. He already received his flu shot this season when [place] was [place] in the mountains. [place] the [place] 5 months [place]. He has had no active cardiac issues and denies any chest pain or palpitations. He had a follow-up with his cardiologist in May and no new medication changes were made.    Medications: reviewed  Allergies:  Allergies  Allergen Reactions  . Other Other (See Comments)    Antidepressants...has a variety of reactions to different antidepressants  . Prozac [Fluoxetine Hcl] Other (See Comments)    nightmares    Review of Systems: See HPI Remaining ROS negative:   Physical Exam: Blood pressure 118/62, pulse 62, temperature 97.7 F (36.5 C), temperature source Oral, height 5\' 5"  (1.651 m), weight 142 lb 11.2 oz (64.728 kg), SpO2 100 %. Wt Readings from Last 3 Encounters:  03/07/15 142 lb 11.2 oz (64.728 kg)  09/16/14 140 lb 6.4 oz (63.685 kg)  08/23/14 141 lb 12.8 oz (64.32 kg)     General appearance: Thin Caucasian man HENNT: Pharynx no erythema, exudate, mass, or ulcer. No thyromegaly or thyroid nodules Lymph nodes: No cervical, supraclavicular, or axillary lymphadenopathy Breasts:  Lungs: Clear to auscultation, resonant to percussion throughout Heart: Regular rhythm, no murmur, no gallop, no rub, no click, no edema Abdomen: Soft,  nontender, normal bowel sounds, no mass, no organomegaly Extremities: No edema, no calf tenderness Musculoskeletal: no joint deformities GU:  Vascular: Carotid pulses 2+, no bruits, Neurologic: Alert, oriented, PERRLA, cranial nerves grossly normal, motor strength 5 over 5, reflexes 1+ symmetric, upper body coordination normal, gait normal, Skin: No rash or  ecchymosis. Large seborrheic keratosis left frontal region.  Lab Results: CBC W/Diff    Component Value Date/Time   WBC 4.1 03/02/2015 1010   WBC 5.2 03/23/2013 1424   RBC 4.58 03/02/2015 1010   RBC 4.54 03/23/2013 1424   HGB 15.1 03/02/2015 1010   HGB 14.6 03/23/2013 1424   HCT 45.7 03/02/2015 1010   HCT 45.4 03/23/2013 1424   PLT 100* 03/02/2015 1010   PLT 108* 03/23/2013 1424   MCV 99.8 03/02/2015 1010   MCV 100.0* 03/23/2013 1424   MCH 33.0 03/02/2015 1010   MCH 32.3 03/23/2013 1424   MCHC 33.0 03/02/2015 1010   MCHC 32.3 03/23/2013 1424   RDW 12.8 03/02/2015 1010   RDW 12.7 03/23/2013 1424   LYMPHSABS 1.4 03/02/2015 1010   LYMPHSABS 1.1 03/23/2013 1424   MONOABS 0.3 03/02/2015 1010   MONOABS 0.4 03/23/2013 1424   EOSABS 0.1 03/02/2015 1010   EOSABS 0.1 03/23/2013 1424   BASOSABS 0.0 03/02/2015 1010   BASOSABS 0.0 03/23/2013 1424     Chemistry      Component Value Date/Time   NA 135 03/02/2015 1010   NA 142 03/23/2013 1424   NA 140 01/10/2011 1442   K 3.7 03/02/2015 1010   K 4.7 03/23/2013 1424   K 4.6 01/10/2011 1442   CL 103 03/02/2015 1010   CL 106 09/22/2012 1428   CL 102 01/10/2011 1442   CO2 25 03/02/2015 1010   CO2 28 03/23/2013 1424   CO2 26 01/10/2011 1442   BUN 20 03/02/2015 1010   BUN 18.1 03/23/2013 1424   BUN 19 01/10/2011 1442   CREATININE 0.86 03/02/2015 1010   CREATININE 0.85 10/25/2014 1040   CREATININE 0.8 03/23/2013 1424      Component Value Date/Time   CALCIUM 8.7* 03/02/2015 1010   CALCIUM 9.4 03/23/2013 1424   CALCIUM 8.7 01/10/2011 1442   ALKPHOS 61 03/02/2015 1010   ALKPHOS 62 03/23/2013 1424   AST 26 03/02/2015 1010   AST 25 03/23/2013 1424   ALT 24 03/02/2015 1010   ALT 27 03/23/2013 1424   BILITOT 1.4* 03/02/2015 1010   BILITOT 0.86 03/23/2013 1424       Radiological Studies: No results found.  Impression:  #1. 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 Suspect this might have been due to an intercurrent viral infection with transient bone marrow suppression. I will see him again with repeat blood counts in 6 months.  #2. Stage I mycosis fungoides Minimal skin involvement treated with local treatment and no evidence of recurrence at this time. He continues dermatology follow-up.  #3. Coronary artery disease status post MI January 2009 status post angioplasty and stent to the LAD  #4. Squamous cell carcinoma of the scalp treated with primary excision  #5. Tubular adenoma of the colon resected;  continue surveillance colonoscopies. Last procedure done April 2013.  CC: Patient Care Team: Eulas Post, MD as PCP - General (Family Medicine) Annia Belt, MD as Consulting Physician (Oncology) Josue Hector, MD as Consulting Physician (Cardiology) Jari Pigg, MD as Consulting Physician (Dermatology) Linford Arnold  Irish Elders, MD as Referring Physician (Dermatology)   Annia Belt, MD 10/24/20163:32 PM

## 2015-03-07 NOTE — Patient Instructions (Signed)
Lab & visit in 6 months - can come in 1 week before visit for lab or day of visit per patient preference

## 2015-03-09 DIAGNOSIS — M25562 Pain in left knee: Secondary | ICD-10-CM | POA: Diagnosis not present

## 2015-03-09 DIAGNOSIS — M25561 Pain in right knee: Secondary | ICD-10-CM | POA: Diagnosis not present

## 2015-03-09 NOTE — Addendum Note (Signed)
Addended by: Ailene Rud E on: 03/09/2015 08:05 AM   Modules accepted: Orders

## 2015-03-11 DIAGNOSIS — H40013 Open angle with borderline findings, low risk, bilateral: Secondary | ICD-10-CM | POA: Diagnosis not present

## 2015-03-11 DIAGNOSIS — H52202 Unspecified astigmatism, left eye: Secondary | ICD-10-CM | POA: Diagnosis not present

## 2015-03-11 DIAGNOSIS — H01002 Unspecified blepharitis right lower eyelid: Secondary | ICD-10-CM | POA: Diagnosis not present

## 2015-03-11 DIAGNOSIS — H35373 Puckering of macula, bilateral: Secondary | ICD-10-CM | POA: Diagnosis not present

## 2015-03-14 ENCOUNTER — Other Ambulatory Visit: Payer: Medicare Other

## 2015-03-16 ENCOUNTER — Ambulatory Visit (INDEPENDENT_AMBULATORY_CARE_PROVIDER_SITE_OTHER): Payer: Medicare Other | Admitting: Cardiovascular Disease

## 2015-03-16 ENCOUNTER — Encounter: Payer: Self-pay | Admitting: Cardiovascular Disease

## 2015-03-16 VITALS — BP 118/64 | HR 52 | Ht 65.0 in | Wt 138.0 lb

## 2015-03-16 DIAGNOSIS — I2510 Atherosclerotic heart disease of native coronary artery without angina pectoris: Secondary | ICD-10-CM | POA: Diagnosis not present

## 2015-03-16 DIAGNOSIS — I10 Essential (primary) hypertension: Secondary | ICD-10-CM | POA: Diagnosis not present

## 2015-03-16 DIAGNOSIS — E785 Hyperlipidemia, unspecified: Secondary | ICD-10-CM

## 2015-03-16 MED ORDER — LOSARTAN POTASSIUM 50 MG PO TABS: 50.0000 mg | ORAL_TABLET | Freq: Every day | ORAL | Status: DC

## 2015-03-16 MED ORDER — ATORVASTATIN CALCIUM 20 MG PO TABS: 20.0000 mg | ORAL_TABLET | Freq: Every day | ORAL | Status: DC

## 2015-03-16 MED ORDER — NITROGLYCERIN 0.4 MG SL SUBL: 0.4000 mg | SUBLINGUAL_TABLET | SUBLINGUAL | Status: DC | PRN

## 2015-03-16 MED ORDER — CARVEDILOL 3.125 MG PO TABS: 3.1250 mg | ORAL_TABLET | Freq: Two times a day (BID) | ORAL | Status: DC

## 2015-03-16 NOTE — Patient Instructions (Signed)
Medication Instructions:  Your physician recommends that you continue on your current medications as directed. Please refer to the Current Medication list given to you today.   Labwork: none  Testing/Procedures: none  Follow-Up  Your physician wants you to follow-up in: 12 months.  You will receive a reminder letter in the mail two months in advance. If you don't receive a letter, please call our office to schedule the follow-up appointment     If you need a refill on your cardiac medications before your next appointment, please call your pharmacy.

## 2015-03-16 NOTE — Progress Notes (Signed)
Chief Complaint  Patient presents with  . Leg Swelling  '  History of Present Illness: 74 yo WM with history of CAD, HTN, hyperlipidemia, idiopathic thrombocytopenia and OSA here today for cardiac follow up. In January of 2009 he had an anterior MI treated with a drug-eluting stent in the LAD. His ejection fraction improved from 25% to 40-50% following the first event. No evidence of AAA on abdominal aortic u/s December 2013. He has been diagnosed with T-cell lymphoma but this is in remission. We arranged a stress myoview on 09/25/12. He exercised for over 10 minutes with no chest pain but his stress test suggested scar in the anterior wall, apex and inferoapical wall with LVEF of 55%. NO ischemic EKG changes. Cardiac cath 19-Oct-2012 with mild to moderate CAD, no obstructive lesions.   He is here today for follow up. He has no chest pain or SOB. He has bilateral knee pain. Recent injections and some improvement.   Primary Care Physician: Carolann Littler   Past Medical History  Diagnosis Date  . Allergy   . CAD (coronary artery disease)   . Hyperlipidemia   . Diverticulosis of colon   . Benign prostatic hypertrophy   . Anxiety   . History of colon polyps   . Depression   . Kidney stones     "I've had them twice; last time was ~ 9 yr ago; both times passed on their own" (2012-10-19)  . Pneumonia     "I think I had as a child" (19-Oct-2012)  . ITP (idiopathic thrombocytopenic purpura)   . Factor VII deficiency ([place])   . History of blood transfusion 1941  . Arthritis     "all over" (10-19-2012)  . Chronic lower back pain     "from a herniated disc" (19-Oct-2012)  . Myocardial infarction ([place]) 2009  . T-cell lymphoma ([place])     "I'm in stage I; original site was on my left chest; had a place around my waist treated last summer; got a place on back of my left leg" (2012/10/19)  . Squamous carcinoma ([place])     "left side; top of head" (19-Oct-2012)  . Basal cell carcinoma of head     "right  side; top of head" (19-Oct-2012)    Past Surgical History  Procedure Laterality Date  . Vasectomy    . Cataract extraction w/ intraocular lens  implant, bilateral Bilateral 2000's  . Mohs surgery Bilateral 2011-2012    "top of my head" (10/19/2012)  . Colonoscopy    . Coronary angioplasty with stent placement  05/29/2007    "1" (10/19/12)  . Cardiac catheterization  2012-10-19  . Tonsillectomy  ~ 1945  . Cyst excision  1980's?    "or fatty tumor; taken off my back" (19-Oct-2012)  . Lumbar epidural injection  ~ 2004  . Left heart catheterization with coronary angiogram N/A Oct 19, 2012    Procedure: LEFT HEART CATHETERIZATION WITH CORONARY ANGIOGRAM;  Surgeon: Burnell Blanks, MD;  Location: [facility] CATH LAB;  Service: Cardiovascular;  Laterality: N/A;    Current Outpatient Prescriptions  Medication Sig Dispense Refill  . acetaminophen (TYLENOL) 650 MG CR tablet Take 650 mg by mouth 2 (two) times daily.     Marland Kitchen ALPRAZolam (XANAX) 0.5 MG tablet Take 0.5 mg by mouth at bedtime as needed for anxiety (for sleep).    Marland Kitchen aspirin 81 MG tablet Take 81 mg by mouth daily.      Marland Kitchen atorvastatin (LIPITOR) 20 MG tablet Take 1 tablet (20  mg total) by mouth daily. 90 tablet 3  . carvedilol (COREG) 3.125 MG tablet Take 1 tablet (3.125 mg total) by mouth 2 (two) times daily with a meal. 180 tablet 3  . Cholecalciferol (VITAMIN D3) 1000 UNITS tablet Take 1,000 Units by mouth 2 (two) times daily.     . Cyanocobalamin (VITAMIN B 12 PO) Take 500 mg by mouth daily.    . fluticasone (FLONASE) 50 MCG/ACT nasal spray Place 2 sprays into both nostrils daily.    Marland Kitchen losartan (COZAAR) 50 MG tablet Take 1 tablet (50 mg total) by mouth daily. 90 tablet 3  . Magnesium 250 MG TABS Take 250 mg by mouth daily.     . Omega-3 Fatty Acids (FISH OIL) 1000 MG CAPS Take 1,000 mg by mouth 2 (two) times daily.     . Saw Palmetto 450 MG CAPS Take 450 mg by mouth 2 (two) times daily.     Marland Kitchen Ubiquinol 100 MG CAPS Take 1 capsule by mouth  daily.      Marland Kitchen doxycycline (VIBRAMYCIN) 100 MG capsule Take 1 capsule (100 mg total) by mouth 2 (two) times daily. (Patient not taking: Reported on 03/16/2015) 20 capsule 0  . nitroGLYCERIN (NITROSTAT) 0.4 MG SL tablet Place 1 tablet (0.4 mg total) under the tongue every 5 (five) minutes as needed for chest pain (for chest pain). 25 tablet 6   No current facility-administered medications for this visit.    Allergies  Allergen Reactions  . Other Other (See Comments)    Antidepressants...has a variety of reactions to different antidepressants  . Prozac [Fluoxetine Hcl] Other (See Comments)    nightmares    Social History   Social History  . Marital Status: Married    Spouse Name: N/A  . Number of Children: 2  . Years of Education: N/A   Occupational History  . Retired    Social History Main Topics  . Smoking status: Never Smoker   . Smokeless tobacco: Never Used  . Alcohol Use: No  . Drug Use: No  . Sexual Activity: Not Currently   Other Topics Concern  . Not on file   Social History Narrative   No caffeine     Family History  Problem Relation Age of Onset  . Alzheimer's disease Mother   . Lymphoma Father   . Coronary artery disease Brother   . COPD Brother   . Diabetes Brother   . Colon cancer Paternal Uncle   . Diabetes Brother     Review of Systems:  As stated in the HPI and otherwise negative.   BP 118/64 mmHg  Pulse 52  Ht 5\' 5"  (1.651 m)  Wt 138 lb (62.596 kg)  BMI 22.96 kg/m2  Physical Examination: General: Well developed, well nourished, NAD HEENT: OP clear, mucus membranes moist SKIN: warm, dry. No rashes. Neuro: No focal deficits Musculoskeletal: Muscle strength 5/5 all ext Psychiatric: Mood and affect normal Neck: No JVD, no carotid bruits, no thyromegaly, no lymphadenopathy. Lungs:Clear bilaterally, no wheezes, rhonci, crackles Cardiovascular: Regular rate and rhythm. No murmurs, gallops or rubs. Abdomen:Soft. Bowel sounds present.  Non-tender.  Extremities: No lower extremity edema. Pulses are 2 + in the bilateral DP/PT.  Stress myoview 09/25/12:  Rest Procedure: Myocardial perfusion imaging was performed at rest 45 minutes following the intravenous administration of Technetium 62m Sestamibi.  Rest ECG: NSR old anterior MI  Stress Procedure: The patient exercised on the treadmill utilizing the Bruce Protocol for 10:15 minutes. The patient stopped due to  fatigue, and chest tightness 4/10. This patient had rare pacs/pvcs with exercise.Technetium 73m Sestamibi was injected at peak exercise and myocardial perfusion imaging was performed after a brief delay.  Stress ECG: No significant change from baseline ECG  QPS  Raw Data Images: Normal; no motion artifact; normal heart/lung ratio.  Stress Images: There is decreased uptake in the anterior wall.  Rest Images: There is decreased uptake in the anterior wall.  Subtraction (SDS): There is a fixed defect that is most consistent with a previous infarction.  Transient Ischemic Dilatation (Normal <1.22): 0.92  Lung/Heart Ratio (Normal <0.45): 0.21  Quantitative Gated Spect Images  QGS EDV: 93 ml  QGS ESV: 42 ml  Impression  Exercise Capacity: Excellent exercise capacity.  BP Response: Normal blood pressure response.  Clinical Symptoms: Chest tightness  ECG Impression: No significant ST segment change suggestive of ischemia.  Comparison with Prior Nuclear Study: No images to compare  Overall Impression: Intermediate risk stress nuclear study Moderate mid and distal anterior wall, apical and inferoapical wall infarct with no ischemia.  LV Ejection Fraction: 55%. LV Wall Motion: EF reported as normal but clearly there is an anteroapical wall motion abnormality. Suggest MRI to quantitate and assess scar burden   Cardiac cath may 2014: Left main: No obstructive disease.  Left Anterior Descending Artery: Large caliber vessel that courses to the apex. The proximal vessel has mild  plaque disease. The mid stented segment is patent. The small caliber diagonal branch is patent with ostial 70% stenosis as it is jailed by the stent and unchanged in appearance.  Circumflex Artery: Moderate caliber vessel with moderate caliber obtuse marginal branch with no significant disease. The AV groove Circumflex has a 40% stenosis just after the takeoff of the OM branch, unchanged.  Right Coronary Artery: Large, dominant vessel with serial 20% lesions throughout the proximal vessel. The mid vessel has a 40% stenosis. The distal vessel has mild plaque disease.  Left Ventricular Angiogram: LVEF 55% with anterior and apical hypokinesis.   EKG:  EKG is ordered today. The ekg ordered today demonstrates sinus bradycardia, rate 52 bpm. Poor R wave progression  Recent Labs: 03/02/2015: ALT 24; BUN 20; Creatinine, Ser 0.86; Hemoglobin 15.1; Platelets 100*; Potassium 3.7; Sodium 135   Lipid Panel Lipid Panel     Component Value Date/Time   CHOL 113 08/12/2014 0952   TRIG 68.0 08/12/2014 0952   HDL 61.40 08/12/2014 0952   CHOLHDL 2 08/12/2014 0952   VLDL 13.6 08/12/2014 0952   LDLCALC 38 08/12/2014 0952   LDLDIRECT 137.3 08/08/2006 1059      Wt Readings from Last 3 Encounters:  03/16/15 138 lb (62.596 kg)  03/07/15 142 lb 11.2 oz (64.728 kg)  09/16/14 140 lb 6.4 oz (63.685 kg)     Other studies Reviewed: Additional studies/ records that were reviewed today include: . Review of the above records demonstrates:   Assessment and Plan:   1. CAD: Stable. Continue current meds. His medications are ASA, statin, beta blocker, Ace-inh. Will repeat echo in one year to assess LVEF.   2. HTN: BP controlled. No changes.   3. HLD: Lipids well controlled. Continue statin.   Current medicines are reviewed at length with the patient today.  The patient does not have concerns regarding medicines.  The following changes have been made:  no change  Labs/ tests ordered today include:   Orders  Placed This Encounter  Procedures  . EKG 12-Lead    Disposition:   FU with me in  6 months  Signed, Lauree Chandler, MD 03/16/2015 11:57 AM    [address], [place], [place]  [address] Phone: [phone]; Fax: [phone]

## 2015-03-18 ENCOUNTER — Other Ambulatory Visit: Payer: Medicare Other

## 2015-04-01 ENCOUNTER — Other Ambulatory Visit (INDEPENDENT_AMBULATORY_CARE_PROVIDER_SITE_OTHER): Payer: Medicare Other

## 2015-04-01 ENCOUNTER — Encounter: Payer: Self-pay | Admitting: Family Medicine

## 2015-04-01 DIAGNOSIS — R739 Hyperglycemia, unspecified: Secondary | ICD-10-CM

## 2015-04-01 LAB — HEMOGLOBIN A1C: Hgb A1c MFr Bld: 5.8 % (ref 4.6–6.5)

## 2015-04-01 LAB — GLUCOSE, POCT (MANUAL RESULT ENTRY): POC GLUCOSE: 87 mg/dL (ref 70–99)

## 2015-04-13 DIAGNOSIS — M170 Bilateral primary osteoarthritis of knee: Secondary | ICD-10-CM | POA: Diagnosis not present

## 2015-04-20 DIAGNOSIS — M170 Bilateral primary osteoarthritis of knee: Secondary | ICD-10-CM | POA: Diagnosis not present

## 2015-04-27 DIAGNOSIS — M170 Bilateral primary osteoarthritis of knee: Secondary | ICD-10-CM | POA: Diagnosis not present

## 2015-06-22 DIAGNOSIS — M170 Bilateral primary osteoarthritis of knee: Secondary | ICD-10-CM | POA: Diagnosis not present

## 2015-07-05 ENCOUNTER — Telehealth: Payer: Self-pay | Admitting: Cardiovascular Disease

## 2015-07-05 NOTE — Telephone Encounter (Signed)
Spoke with pt. He has arthritis in knees and is thinking about having partial knee joint replacement. He has seen Dr. Percell Miller but surgery not scheduled yet.  Before he proceeds any further with evaluation he is asking if Dr.McAlhany feels from a cardiac standpoint it would be OK for him to have surgery. He also reports a "strange feeling of weakness" in his left arm and hand for the past week. Not constant. Worse in the evening. It is not numb. Able to use arm and hand and do normal activities. No other weakness. No problems with speech. No facial changes. Not painful. No chest pain.  I asked him to contact primary care for evaluation of this.

## 2015-07-05 NOTE — Telephone Encounter (Signed)
I don't see a reason why he could not have knee replacement if he is feeling well. cdm

## 2015-07-05 NOTE — Telephone Encounter (Signed)
New message     Pt wants surgical clearance i informed him that the office has to call for it.  He said he wants to speak to the Dr. himself

## 2015-07-06 NOTE — Telephone Encounter (Signed)
Spoke with pt and gave him information from Dr. Angelena Form. I told him once surgery is planned surgeon should send [place] clearance form

## 2015-07-29 ENCOUNTER — Encounter: Payer: Self-pay | Admitting: Family Medicine

## 2015-07-29 ENCOUNTER — Ambulatory Visit (INDEPENDENT_AMBULATORY_CARE_PROVIDER_SITE_OTHER): Payer: Medicare Other | Admitting: Family Medicine

## 2015-07-29 VITALS — BP 120/50 | HR 59 | Temp 98.7°F | Ht 65.0 in | Wt 141.5 lb

## 2015-07-29 DIAGNOSIS — J069 Acute upper respiratory infection, unspecified: Secondary | ICD-10-CM | POA: Diagnosis not present

## 2015-07-29 MED ORDER — BENZONATATE 100 MG PO CAPS: 100.0000 mg | ORAL_CAPSULE | Freq: Three times a day (TID) | ORAL | Status: DC | PRN

## 2015-07-29 MED ORDER — DOXYCYCLINE HYCLATE 100 MG PO TABS: 100.0000 mg | ORAL_TABLET | Freq: Two times a day (BID) | ORAL | Status: DC

## 2015-07-29 NOTE — Progress Notes (Signed)
Pre visit review using our clinic review tool, if applicable. No additional management support is needed unless otherwise documented below in the visit note. 

## 2015-07-29 NOTE — Progress Notes (Signed)
HPI:  Acute visit for sinus congestion: -started: 2-3 days ago -symptoms:nasal congestion, sore throat, she gradually throat, occasional cough -denies:fever, SOB, NVD, tooth pain, sinus pain -has tried: Salt water gargles and Flonase -sick contacts/travel/risks: no reported flu, strep or tick exposure -Hx of: Reports immunosuppression, history of blood disorders, risk of infection, reports he always gets antibiotics from PCP if any chance of infection. Prefers doxycycline. ROS: See pertinent positives and negatives per HPI.  Past Medical History  Diagnosis Date  . Allergy   . CAD (coronary artery disease)   . Hyperlipidemia   . Diverticulosis of colon   . Benign prostatic hypertrophy   . Anxiety   . History of colon polyps   . Depression   . Kidney stones     "I've had them twice; last time was ~ 9 yr ago; both times passed on their own" (10-15-12)  . Pneumonia     "I think I had as a child" (10-15-2012)  . ITP (idiopathic thrombocytopenic purpura)   . Factor VII deficiency ([place])   . History of blood transfusion 1941  . Arthritis     "all over" (2012/10/15)  . Chronic lower back pain     "from a herniated disc" (2012-10-15)  . Myocardial infarction ([place]) 2009  . T-cell lymphoma ([place])     "I'm in stage I; original site was on my left chest; had a place around my waist treated last summer; got a place on back of my left leg" (15-Oct-2012)  . Squamous carcinoma ([place])     "left side; top of head" (10-15-12)  . Basal cell carcinoma of head     "right side; top of head" (October 15, 2012)    Past Surgical History  Procedure Laterality Date  . Vasectomy    . Cataract extraction w/ intraocular lens  implant, bilateral Bilateral 2000's  . Mohs surgery Bilateral 2011-2012    "top of my head" (October 15, 2012)  . Colonoscopy    . Coronary angioplasty with stent placement  05/29/2007    "1" (Oct 15, 2012)  . Cardiac catheterization  2012-10-15  . Tonsillectomy  ~ 1945  . Cyst excision  1980's?     "or fatty tumor; taken off my back" (10-15-12)  . Lumbar epidural injection  ~ 2004  . Left heart catheterization with coronary angiogram N/A 10/15/2012    Procedure: LEFT HEART CATHETERIZATION WITH CORONARY ANGIOGRAM;  Surgeon: Burnell Blanks, MD;  Location: [facility] CATH LAB;  Service: Cardiovascular;  Laterality: N/A;    Family History  Problem Relation Age of Onset  . Alzheimer's disease Mother   . Lymphoma Father   . Coronary artery disease Brother   . COPD Brother   . Diabetes Brother   . Colon cancer Paternal Uncle   . Diabetes Brother     Social History   Social History  . Marital Status: Married    Spouse Name: N/A  . Number of Children: 2  . Years of Education: N/A   Occupational History  . Retired    Social History Main Topics  . Smoking status: Never Smoker   . Smokeless tobacco: Never Used  . Alcohol Use: No  . Drug Use: No  . Sexual Activity: Not Currently   Other Topics Concern  . None   Social History Narrative   No caffeine      Current outpatient prescriptions:  .  acetaminophen (TYLENOL) 650 MG CR tablet, Take 650 mg by mouth 2 (two) times daily. , Disp: , Rfl:  .  ALPRAZolam (XANAX) 0.5 MG tablet, Take 0.5 mg by mouth at bedtime as needed for anxiety (for sleep)., Disp: , Rfl:  .  aspirin 81 MG tablet, Take 81 mg by mouth daily.  , Disp: , Rfl:  .  atorvastatin (LIPITOR) 20 MG tablet, Take 1 tablet (20 mg total) by mouth daily., Disp: 90 tablet, Rfl: 3 .  carvedilol (COREG) 3.125 MG tablet, Take 1 tablet (3.125 mg total) by mouth 2 (two) times daily with a meal., Disp: 180 tablet, Rfl: 3 .  Cholecalciferol (VITAMIN D3) 1000 UNITS tablet, Take 1,000 Units by mouth 2 (two) times daily. , Disp: , Rfl:  .  Cyanocobalamin (VITAMIN B 12 PO), Take 500 mg by mouth daily., Disp: , Rfl:  .  losartan (COZAAR) 50 MG tablet, Take 1 tablet (50 mg total) by mouth daily., Disp: 90 tablet, Rfl: 3 .  Magnesium 250 MG TABS, Take 250 mg by mouth daily. ,  Disp: , Rfl:  .  nitroGLYCERIN (NITROSTAT) 0.4 MG SL tablet, Place 1 tablet (0.4 mg total) under the tongue every 5 (five) minutes as needed for chest pain (for chest pain)., Disp: 25 tablet, Rfl: 6 .  Omega-3 Fatty Acids (FISH OIL) 1000 MG CAPS, Take 1,000 mg by mouth 2 (two) times daily. , Disp: , Rfl:  .  Saw Palmetto 450 MG CAPS, Take 450 mg by mouth 2 (two) times daily. , Disp: , Rfl:  .  Ubiquinol 100 MG CAPS, Take 1 capsule by mouth daily.  , Disp: , Rfl:  .  benzonatate (TESSALON PERLES) 100 MG capsule, Take 1 capsule (100 mg total) by mouth 3 (three) times daily as needed., Disp: 20 capsule, Rfl: 0 .  doxycycline (VIBRA-TABS) 100 MG tablet, Take 1 tablet (100 mg total) by mouth 2 (two) times daily., Disp: 20 tablet, Rfl: 0  EXAM:  Filed Vitals:   07/29/15 1617  BP: 120/50  Pulse: 59  Temp: 98.7 F (37.1 C)    Body mass index is 23.55 kg/(m^2).  GENERAL: vitals reviewed and listed above, alert, oriented, appears well hydrated and in no acute distress  HEENT: atraumatic, conjunttiva clear, no obvious abnormalities on inspection of external nose and ears, normal appearance of ear canals and TMs, clear nasal congestion, mild post oropharyngeal erythema with PND, no tonsillar edema or exudate, no sinus TTP  NECK: no obvious masses on inspection  LUNGS: clear to auscultation bilaterally, no wheezes, rales or rhonchi, good air movement  CV: HRRR, no peripheral edema  MS: moves all extremities without noticeable abnormality  PSYCH: pleasant and cooperative, no obvious depression or anxiety  ASSESSMENT AND PLAN:  Discussed the following assessment and plan:  Acute upper respiratory infection  -given HPI and exam findings today, a serious infection or illness is unlikely. We discussed potential etiologies, with VURI being most likely, and advised supportive care and monitoring. We discussed treatment side effects, likely course, antibiotic misuse, transmission, and signs of  developing a serious illness.Delayed antibiotic Rx provided after discussion of risks given his concerns for developing a bacterial infection. -of course, we advised to return or notify a doctor immediately if symptoms worsen or persist or new concerns arise.    Patient Instructions  INSTRUCTIONS FOR UPPER RESPIRATORY INFECTION:  -plenty of rest and fluids  -nasal saline wash 2-3 times daily (use prepackaged nasal saline or bottled/distilled water if making your own)   -can use AFRIN nasal spray for drainage and nasal congestion - but do NOT use longer then 3-4 days  -  if you are taking a cough medication - use only as directed, may also try a teaspoon of honey to coat the throat and throat lozenges.  -for sore throat, salt water gargles can help  -follow up if you have fevers, facial pain, tooth pain, difficulty breathing or are worsening or symptoms persist longer then expected  Upper Respiratory Infection, Adult An upper respiratory infection (URI) is also known as the common cold. It is often caused by a type of germ (virus). Colds are easily spread (contagious). You can pass it to others by kissing, coughing, sneezing, or drinking out of the same glass. Usually, you get better in 1 to 3  weeks.  However, the cough can last for even longer. HOME CARE   Only take medicine as told by your doctor. Follow instructions provided above.  Drink enough water and fluids to keep your pee (urine) clear or pale yellow.  Get plenty of rest.  Return to work when your temperature is < 100 for 24 hours or as told by your doctor. You may use a face mask and wash your hands to stop your cold from spreading. GET HELP RIGHT AWAY IF:   After the first few days, you feel you are getting worse.  You have questions about your medicine.  You have chills, shortness of breath, or red spit (mucus).  You have pain in the face for more then 1-2 days, especially when you bend forward.  You have a fever,  puffy (swollen) neck, pain when you swallow, or white spots in the back of your throat.  You have a bad headache, ear pain, sinus pain, or chest pain.  You have a high-pitched whistling sound when you breathe in and out (wheezing).  You cough up blood.  You have sore muscles or a stiff neck. MAKE SURE YOU:   Understand these instructions.  Will watch your condition.  Will get help right away if you are not doing well or get worse. Document Released: 10/17/2007 Document Revised: 07/23/2011 Document Reviewed: 08/05/2013 [facility] Patient Information 2015 [place], [place]. This information is not intended to replace advice given to you by your health care provider. Make sure you discuss any questions you have with your health care provider.      Johnathan Benton R.

## 2015-07-29 NOTE — Patient Instructions (Signed)
INSTRUCTIONS FOR UPPER RESPIRATORY INFECTION:  -plenty of rest and fluids  -nasal saline wash 2-3 times daily (use prepackaged nasal saline or bottled/distilled water if making your own)   -can use AFRIN nasal spray for drainage and nasal congestion - but do NOT use longer then 3-4 days  -if you are taking a cough medication - use only as directed, may also try a teaspoon of honey to coat the throat and throat lozenges.  -for sore throat, salt water gargles can help  -follow up if you have fevers, facial pain, tooth pain, difficulty breathing or are worsening or symptoms persist longer then expected  Upper Respiratory Infection, Adult An upper respiratory infection (URI) is also known as the common cold. It is often caused by a type of germ (virus). Colds are easily spread (contagious). You can pass it to others by kissing, coughing, sneezing, or drinking out of the same glass. Usually, you get better in 1 to 3  weeks.  However, the cough can last for even longer. HOME CARE   Only take medicine as told by your doctor. Follow instructions provided above.  Drink enough water and fluids to keep your pee (urine) clear or pale yellow.  Get plenty of rest.  Return to work when your temperature is < 100 for 24 hours or as told by your doctor. You may use a face mask and wash your hands to stop your cold from spreading. GET HELP RIGHT AWAY IF:   After the first few days, you feel you are getting worse.  You have questions about your medicine.  You have chills, shortness of breath, or red spit (mucus).  You have pain in the face for more then 1-2 days, especially when you bend forward.  You have a fever, puffy (swollen) neck, pain when you swallow, or white spots in the back of your throat.  You have a bad headache, ear pain, sinus pain, or chest pain.  You have a high-pitched whistling sound when you breathe in and out (wheezing).  You cough up blood.  You have sore muscles or a  stiff neck. MAKE SURE YOU:   Understand these instructions.  Will watch your condition.  Will get help right away if you are not doing well or get worse. Document Released: 10/17/2007 Document Revised: 07/23/2011 Document Reviewed: 08/05/2013 [facility] Patient Information 2015 [place], [place]. This information is not intended to replace advice given to you by your health care provider. Make sure you discuss any questions you have with your health care provider.

## 2015-08-09 DIAGNOSIS — H40011 Open angle with borderline findings, low risk, right eye: Secondary | ICD-10-CM | POA: Diagnosis not present

## 2015-08-09 DIAGNOSIS — H40012 Open angle with borderline findings, low risk, left eye: Secondary | ICD-10-CM | POA: Diagnosis not present

## 2015-08-10 DIAGNOSIS — Z85828 Personal history of other malignant neoplasm of skin: Secondary | ICD-10-CM | POA: Diagnosis not present

## 2015-08-10 DIAGNOSIS — Z23 Encounter for immunization: Secondary | ICD-10-CM | POA: Diagnosis not present

## 2015-08-10 DIAGNOSIS — D2271 Melanocytic nevi of right lower limb, including hip: Secondary | ICD-10-CM | POA: Diagnosis not present

## 2015-08-10 DIAGNOSIS — L570 Actinic keratosis: Secondary | ICD-10-CM | POA: Diagnosis not present

## 2015-08-10 DIAGNOSIS — L821 Other seborrheic keratosis: Secondary | ICD-10-CM | POA: Diagnosis not present

## 2015-08-15 ENCOUNTER — Ambulatory Visit (INDEPENDENT_AMBULATORY_CARE_PROVIDER_SITE_OTHER): Payer: Medicare Other | Admitting: Family Medicine

## 2015-08-15 ENCOUNTER — Encounter: Payer: Self-pay | Admitting: Family Medicine

## 2015-08-15 VITALS — BP 122/78 | HR 57 | Temp 97.5°F | Ht <= 58 in | Wt 139.8 lb

## 2015-08-15 DIAGNOSIS — E785 Hyperlipidemia, unspecified: Secondary | ICD-10-CM

## 2015-08-15 DIAGNOSIS — N400 Enlarged prostate without lower urinary tract symptoms: Secondary | ICD-10-CM

## 2015-08-15 DIAGNOSIS — I10 Essential (primary) hypertension: Secondary | ICD-10-CM

## 2015-08-15 DIAGNOSIS — Z0000 Encounter for general adult medical examination without abnormal findings: Secondary | ICD-10-CM

## 2015-08-15 LAB — COMPREHENSIVE METABOLIC PANEL
ALT: 24 U/L (ref 0–53)
AST: 21 U/L (ref 0–37)
Albumin: 4.3 g/dL (ref 3.5–5.2)
Alkaline Phosphatase: 70 U/L (ref 39–117)
BUN: 20 mg/dL (ref 6–23)
CO2: 31 meq/L (ref 19–32)
CREATININE: 0.86 mg/dL (ref 0.40–1.50)
Calcium: 9.3 mg/dL (ref 8.4–10.5)
Chloride: 104 mEq/L (ref 96–112)
GFR: 91.69 mL/min (ref >60.00)
GLUCOSE: 95 mg/dL (ref 70–99)
Potassium: 3.8 mEq/L (ref 3.5–5.1)
Sodium: 141 mEq/L (ref 135–145)
Total Bilirubin: 1.2 mg/dL (ref 0.2–1.2)
Total Protein: 6.3 g/dL (ref 6.0–8.3)

## 2015-08-15 LAB — LIPID PANEL
CHOL/HDL RATIO: 2
Cholesterol: 118 mg/dL (ref 0–200)
HDL: 61.9 mg/dL (ref >39.00)
LDL Cholesterol: 42 mg/dL (ref 0–99)
NONHDL: 55.68
Triglycerides: 66 mg/dL (ref 0.0–149.0)
VLDL: 13.2 mg/dL (ref 0.0–40.0)

## 2015-08-15 LAB — PSA: PSA: 1.46 ng/mL (ref 0.10–4.00)

## 2015-08-15 MED ORDER — ALPRAZOLAM 0.5 MG PO TABS: 0.5000 mg | ORAL_TABLET | Freq: Two times a day (BID) | ORAL | Status: DC | PRN

## 2015-08-15 MED ORDER — DOXYCYCLINE HYCLATE 100 MG PO TABS: 100.0000 mg | ORAL_TABLET | Freq: Two times a day (BID) | ORAL | Status: DC

## 2015-08-15 NOTE — Patient Instructions (Signed)
Health Maintenance  Topic Date Due  . PNA vac Low Risk Adult (2 of 2 - PPSV23) 08/11/2014  . ZOSTAVAX  08/12/2019 (Originally 10/11/1998)  . INFLUENZA VACCINE  12/13/2015  . COLONOSCOPY  09/03/2016  . TETANUS/TDAP  06/14/2019

## 2015-08-15 NOTE — Progress Notes (Signed)
Subjective:    Patient ID: [name], male    DOB: [birth year], 75 y.o.   MRN: [id number]  HPI Here for Medicare subsequent annual wellness visit and medical follow-up.  History of CAD. Single vessel coronary disease followed by cardiology. History of ischemic cardiomyopathy. Denies any recent chest pains. He had repeat catheterization over year ago showed no progression of disease.  Chronic low platelets followed by hematology. He has history of cutaneous T-cell lymphoma which has been stable with no recent signs of recurrence.  Upcoming plans for right total knee replacement. He plans see hematologist before then.  Medications reviewed. He was taken off Plavix by cardiology. Remains on aspirin. He also made on beta-blocker and angiotensin receptor blocker. Blood pressure well controlled. No dizziness. No recent dyspnea.  History of BPH. Has occasional slow stream and occasional nocturia.  Past Medical History  Diagnosis Date  . Allergy   . CAD (coronary artery disease)   . Hyperlipidemia   . Diverticulosis of colon   . Benign prostatic hypertrophy   . Anxiety   . History of colon polyps   . Depression   . Kidney stones     "I've had them twice; last time was ~ 9 yr ago; both times passed on their own" (October 15, 2012)  . Pneumonia     "I think I had as a child" (15-Oct-2012)  . ITP (idiopathic thrombocytopenic purpura)   . Factor VII deficiency ([place])   . History of blood transfusion 1941  . Arthritis     "all over" (2012-10-15)  . Chronic lower back pain     "from a herniated disc" (10/15/2012)  . Myocardial infarction ([place]) 2009  . T-cell lymphoma ([place])     "I'm in stage I; original site was on my left chest; had a place around my waist treated last summer; got a place on back of my left leg" (10/15/12)  . Squamous carcinoma ([place])     "left side; top of head" (10-15-2012)  . Basal cell carcinoma of head     "right side; top of head" (October 15, 2012)   Past Surgical History    Procedure Laterality Date  . Vasectomy    . Cataract extraction w/ intraocular lens  implant, bilateral Bilateral 2000's  . Mohs surgery Bilateral 2011-2012    "top of my head" (10/15/12)  . Colonoscopy    . Coronary angioplasty with stent placement  05/29/2007    "1" (2012-10-15)  . Cardiac catheterization  10/15/12  . Tonsillectomy  ~ 1945  . Cyst excision  1980's?    "or fatty tumor; taken off my back" (Oct 15, 2012)  . Lumbar epidural injection  ~ 2004  . Left heart catheterization with coronary angiogram N/A 10-15-12    Procedure: LEFT HEART CATHETERIZATION WITH CORONARY ANGIOGRAM;  Surgeon: Burnell Blanks, MD;  Location: [facility] CATH LAB;  Service: Cardiovascular;  Laterality: N/A;    reports that he has never smoked. He has never used smokeless tobacco. He reports that he does not drink alcohol or use illicit drugs. family history includes Alzheimer's disease in his mother; COPD in his brother; Colon cancer in his paternal uncle; Coronary artery disease in his brother; Diabetes in his brother and brother; Lymphoma in his father. Allergies  Allergen Reactions  . Other Other (See Comments)    Antidepressants...has a variety of reactions to different antidepressants  . Prozac [Fluoxetine Hcl] Other (See Comments)    nightmares   1.  Risk factors based on Past Medical , Social,  and Family history reviewed and as indicated above with no changes 2.  Limitations in physical activities More limited with walking recently secondary to advanced osteoarthritis of both knees. No recent falls 3.  Depression/mood No active depression or anxiety issues 4.  Hearing No defiits 5.  ADLs independent in all. 6.  Cognitive function (orientation to time and place, language, writing, speech,memory) no short or long term memory issues.  Language and judgement intact. 7.  Home Safety no issues 8.  Height, weight, and visual acuity.all stable. 9.  Counseling discussed continue yearly flu vaccine 10.  Recommendation of preventive services. Immunizations up-to-date 11. Labs based on risk factors Lipid, comprehensive metabolic panel, PSA 12. Care Plan-As above 13. Other Providers Dr McAlhany-Cardiology,  Dr Sandra Cockayne 14. Written schedule of screening/prevention services given to patient.    Review of Systems  Constitutional: Negative for fatigue and unexpected weight change.  Eyes: Negative for visual disturbance.  Respiratory: Negative for cough, chest tightness and shortness of breath.   Cardiovascular: Negative for chest pain, palpitations and leg swelling.  Genitourinary: Negative for dysuria.  Neurological: Negative for dizziness, syncope, weakness, light-headedness and headaches.       Objective:   Physical Exam  Constitutional: He is oriented to person, place, and time. He appears well-developed and well-nourished.  Neck: Neck supple. No thyromegaly present.  Cardiovascular: Normal rate and regular rhythm.   Pulmonary/Chest: Effort normal and breath sounds normal. No respiratory distress. He has no wheezes. He has no rales.  Musculoskeletal: He exhibits no edema.  Neurological: He is alert and oriented to person, place, and time. No cranial nerve deficit.          Assessment & Plan:  #1 Medicare wellness exam. Immunizations reviewed. He cannot take shingles vaccine secondary to history of cutaneous T-cell lymphoma. Other Immunizations are up-to-date.  #2 history of CAD. Recheck lipid panel  #3 history of BPH. Symptomatically stable. Patient requesting PSA. The natural history of prostate cancer and ongoing controversy regarding screening and potential treatment outcomes of prostate cancer has been discussed with the patient. The meaning of a false positive PSA and a false negative PSA has been discussed. He indicates understanding of the limitations of this screening test and wishes  to proceed with screening PSA testing.  #4 hypertension stable and at  goal

## 2015-08-24 ENCOUNTER — Telehealth: Payer: Self-pay | Admitting: Family Medicine

## 2015-08-24 NOTE — Telephone Encounter (Signed)
APPT. REMINDER CALL, LMTCB °

## 2015-08-29 ENCOUNTER — Other Ambulatory Visit (INDEPENDENT_AMBULATORY_CARE_PROVIDER_SITE_OTHER): Payer: Medicare Other

## 2015-08-29 DIAGNOSIS — D693 Immune thrombocytopenic purpura: Secondary | ICD-10-CM

## 2015-08-29 DIAGNOSIS — D696 Thrombocytopenia, unspecified: Secondary | ICD-10-CM

## 2015-08-29 DIAGNOSIS — C8400 Mycosis fungoides, unspecified site: Secondary | ICD-10-CM

## 2015-08-29 LAB — CBC WITH DIFFERENTIAL/PLATELET
BASOS ABS: 0 10*3/uL (ref 0.0–0.1)
BASOS PCT: 0 %
Eosinophils Absolute: 0.1 10*3/uL (ref 0.0–0.7)
Eosinophils Relative: 2 %
HCT: 47.2 % (ref 39.0–52.0)
HEMOGLOBIN: 15.2 g/dL (ref 13.0–17.0)
LYMPHS PCT: 36 %
Lymphs Abs: 1.7 10*3/uL (ref 0.7–4.0)
MCH: 32.4 pg (ref 26.0–34.0)
MCHC: 32.2 g/dL (ref 30.0–36.0)
MCV: 100.6 fL — ABNORMAL HIGH (ref 78.0–100.0)
Monocytes Absolute: 0.3 10*3/uL (ref 0.1–1.0)
Monocytes Relative: 7 %
NEUTROS ABS: 2.6 10*3/uL (ref 1.7–7.7)
NEUTROS PCT: 55 %
PLATELETS: 109 10*3/uL — AB (ref 150–400)
RBC: 4.69 MIL/uL (ref 4.22–5.81)
RDW: 13 % (ref 11.5–15.5)
WBC: 4.7 10*3/uL (ref 4.0–10.5)

## 2015-08-30 ENCOUNTER — Telehealth: Payer: Self-pay | Admitting: *Deleted

## 2015-08-30 NOTE — Telephone Encounter (Signed)
Pt called / informed "Platelets count stable @ 109,000" per Dr Beryle Beams. Stated "good" and he will see [place] next Monday.

## 2015-08-30 NOTE — Telephone Encounter (Signed)
-----   Message from Annia Belt, MD sent at 08/29/2015  4:16 PM EDT ----- Call pt platelet count stable @ 109,000

## 2015-09-05 ENCOUNTER — Encounter: Payer: Self-pay | Admitting: Oncology

## 2015-09-05 ENCOUNTER — Ambulatory Visit (INDEPENDENT_AMBULATORY_CARE_PROVIDER_SITE_OTHER): Payer: Medicare Other | Admitting: Oncology

## 2015-09-05 VITALS — BP 109/60 | HR 58 | Temp 97.7°F | Ht 65.0 in | Wt 141.2 lb

## 2015-09-05 DIAGNOSIS — Z955 Presence of coronary angioplasty implant and graft: Secondary | ICD-10-CM | POA: Diagnosis not present

## 2015-09-05 DIAGNOSIS — I2510 Atherosclerotic heart disease of native coronary artery without angina pectoris: Secondary | ICD-10-CM

## 2015-09-05 DIAGNOSIS — M1710 Unilateral primary osteoarthritis, unspecified knee: Secondary | ICD-10-CM | POA: Diagnosis not present

## 2015-09-05 DIAGNOSIS — Z85038 Personal history of other malignant neoplasm of large intestine: Secondary | ICD-10-CM

## 2015-09-05 DIAGNOSIS — I252 Old myocardial infarction: Secondary | ICD-10-CM | POA: Diagnosis not present

## 2015-09-05 DIAGNOSIS — C8400 Mycosis fungoides, unspecified site: Secondary | ICD-10-CM | POA: Diagnosis not present

## 2015-09-05 DIAGNOSIS — D693 Immune thrombocytopenic purpura: Secondary | ICD-10-CM

## 2015-09-05 DIAGNOSIS — Z85828 Personal history of other malignant neoplasm of skin: Secondary | ICD-10-CM

## 2015-09-05 NOTE — Patient Instructions (Signed)
MD visit 6 months Lab 1 week before visit

## 2015-09-05 NOTE — Progress Notes (Signed)
Patient ID: [name], male   DOB: 03-06-1941, 75 y.o.   MRN: [id number] Hematology and Oncology Follow Up Visit  DELVONTE SOMOZA [id number] 12-23-1940 75 y.o. 09/05/2015 6:43 PM   Principle Diagnosis: Encounter Diagnoses  Name Primary?  . ITP (idiopathic thrombocytopenic purpura) Yes  . Mycosis fungoides, stage 1 [facility])   Clinical Summary: 75 year old man initially diagnosed with ITP in November 2009. Platelet counts have been consistently over 100,000 until 2016 1 platelets dropped to 72,000 on April 4. Repeat value one week later 88,000. Platelet count rebounded to his prior baseline and was 100,000 on 03/02/2015 and 109,000 on 08/29/2015. He has never required any specific treatment. He developed a patchy rash over a 4 cm area, left pectoral region of his chest, which was there for about 7 years. He changed dermatologists. He was evaluated by Dr. Jari Pigg. 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 He was referred to Dr. Adelene Idler at [facility]. Repeat biopsies were confirmatory. He was treated with topical clobetasol ointment with complete regression of the rash. He has had no recurrent areas of involvement. I obtained baseline CAT scans in August of 2012 and there was no organomegaly or lymphadenopathy. However, a lesion was seen on his scalp which was subsequently excised on 05/31/2011 and showed poorly differentiated squamous cell carcinoma. This appeared to be a localized process and not a metastasis. He has a history of a tubular adenoma removed at time of a colonoscopy 09/04/2011 by Dr. Oretha Caprice.   Interim History:   Overall doing well but he has had progressive arthritis in his knees. He was evaluated by orthopedics and knee replacement is planned. Most recent follow-up with cardiology on 03/16/2015. He has known underlying coronary artery disease with a history of a anterior MI in January 2009. He required a drug  eluting stent to the LAD. Most recent cardiac cath done 10/03/2012 did not show any clinically significant obstructive lesions. At time of the November 2016 visit, cardiac status was stable and no new medication changes were made. Cardiologist did not feel that there were any contraindications to upcoming knee surgery. He had a recent follow-up visit with his primary care physician on April 3. Other medical issues are currently under control. He is not had any clinical bleeding.   Medications: reviewed  Allergies:  Allergies  Allergen Reactions  . Other Other (See Comments)    Antidepressants...has a variety of reactions to different antidepressants  . Prozac [Fluoxetine Hcl] Other (See Comments)    nightmares    Review of Systems: See interim history Remaining ROS negative:   Physical Exam: Blood pressure 109/60, pulse 58, temperature 97.7 F (36.5 C), temperature source Oral, height 5\' 5"  (1.651 m), weight 141 lb 3.2 oz (64.048 kg), SpO2 100 %. Wt Readings from Last 3 Encounters:  09/05/15 141 lb 3.2 oz (64.048 kg)  08/15/15 139 lb 12.8 oz (63.413 kg)  07/29/15 141 lb 8 oz (64.184 kg)     General appearance: Thin Caucasian man HENNT: Pharynx no erythema, exudate, mass, or ulcer. No thyromegaly or thyroid nodules Lymph nodes: No cervical, supraclavicular, or axillary lymphadenopathy Breasts:  Lungs: Clear to auscultation, resonant to percussion throughout Heart: Regular rhythm, no murmur, no gallop, no rub, no click, no edema Abdomen: Soft, nontender, normal bowel sounds, no mass, no organomegaly Extremities: No edema, no calf tenderness Musculoskeletal: no joint deformities GU:  Vascular: Carotid pulses 2+, no bruits,  Neurologic:  Alert, oriented, PERRLA, optic discs sharp and vessels normal, no hemorrhage or exudate, cranial nerves grossly normal, motor strength 5 over 5, reflexes 1+ symmetric, upper body coordination normal, gait unsteady due to painful arthritis in his  knees Skin: No rash or ecchymosis. Large 4 x 4 centimeter seborrheic keratosis left temple area. Vague, almost not visible, salmon-colored rash in the left pectoral area stable compared with prior exams. Macular area with crusting center medial aspect left calf likely actinic keratosis. No other skin lesions suspicious for mycosis fungoides.  Lab Results: CBC W/Diff    Component Value Date/Time   WBC 4.7 08/29/2015 1115   WBC 5.2 03/23/2013 1424   RBC 4.69 08/29/2015 1115   RBC 4.54 03/23/2013 1424   HGB 15.2 08/29/2015 1115   HGB 14.6 03/23/2013 1424   HCT 47.2 08/29/2015 1115   HCT 45.4 03/23/2013 1424   PLT 109* 08/29/2015 1115   PLT 108* 03/23/2013 1424   MCV 100.6* 08/29/2015 1115   MCV 100.0* 03/23/2013 1424   MCH 32.4 08/29/2015 1115   MCH 32.3 03/23/2013 1424   MCHC 32.2 08/29/2015 1115   MCHC 32.3 03/23/2013 1424   RDW 13.0 08/29/2015 1115   RDW 12.7 03/23/2013 1424   LYMPHSABS 1.7 08/29/2015 1115   LYMPHSABS 1.1 03/23/2013 1424   MONOABS 0.3 08/29/2015 1115   MONOABS 0.4 03/23/2013 1424   EOSABS 0.1 08/29/2015 1115   EOSABS 0.1 03/23/2013 1424   BASOSABS 0.0 08/29/2015 1115   BASOSABS 0.0 03/23/2013 1424     Chemistry      Component Value Date/Time   NA 141 08/15/2015 0936   NA 142 03/23/2013 1424   NA 140 01/10/2011 1442   K 3.8 08/15/2015 0936   K 4.7 03/23/2013 1424   K 4.6 01/10/2011 1442   CL 104 08/15/2015 0936   CL 106 09/22/2012 1428   CL 102 01/10/2011 1442   CO2 31 08/15/2015 0936   CO2 28 03/23/2013 1424   CO2 26 01/10/2011 1442   BUN 20 08/15/2015 0936   BUN 18.1 03/23/2013 1424   BUN 19 01/10/2011 1442   CREATININE 0.86 08/15/2015 0936   CREATININE 0.85 10/25/2014 1040   CREATININE 0.8 03/23/2013 1424      Component Value Date/Time   CALCIUM 9.3 08/15/2015 0936   CALCIUM 9.4 03/23/2013 1424   CALCIUM 8.7 01/10/2011 1442   ALKPHOS 70 08/15/2015 0936   ALKPHOS 62 03/23/2013 1424   AST 21 08/15/2015 0936   AST 25 03/23/2013 1424    ALT 24 08/15/2015 0936   ALT 27 03/23/2013 1424   BILITOT 1.2 08/15/2015 0936   BILITOT 0.86 03/23/2013 1424       Radiological Studies: No results found.  Impression:  #1. 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 Suspect this might have been due to an intercurrent viral infection with transient bone marrow suppression.   #2. Stage I mycosis fungoides Minimal skin involvement treated with local treatment and no evidence of recurrence at this time. He continues dermatology follow-up.  #3. Coronary artery disease status post MI January 2009 status post angioplasty and stent to the LAD  #4. Squamous cell carcinoma of the scalp treated with primary excision  #5. Tubular adenoma of the colon resected; continue surveillance colonoscopies. Last procedure done April 2013.  #6. Degenerative arthritis with planned knee replacement surgery Current platelet count 109,000 so I do not anticipate any problems with the  surgery. It is also reasonable to use Xarelto 10 mg daily prophylactic dose for 4 weeks following surgery. He has normal renal function.  CC: Patient Care Team: Eulas Post, MD as PCP - General (Family Medicine) Annia Belt, MD as Consulting Physician (Oncology) Josue Hector, MD as Consulting Physician (Cardiology) Jari Pigg, MD as Consulting Physician (Dermatology) Renaldo Harrison, MD as Referring Physician (Dermatology)   Annia Belt, MD 4/24/20176:43 PM

## 2015-09-16 ENCOUNTER — Encounter: Payer: Self-pay | Admitting: Family Medicine

## 2015-09-16 ENCOUNTER — Ambulatory Visit (INDEPENDENT_AMBULATORY_CARE_PROVIDER_SITE_OTHER): Payer: Medicare Other | Admitting: Family Medicine

## 2015-09-16 VITALS — BP 110/84 | HR 98 | Temp 98.3°F | Ht 65.0 in | Wt 140.0 lb

## 2015-09-16 DIAGNOSIS — R21 Rash and other nonspecific skin eruption: Secondary | ICD-10-CM

## 2015-09-16 DIAGNOSIS — R29898 Other symptoms and signs involving the musculoskeletal system: Secondary | ICD-10-CM

## 2015-09-16 NOTE — Progress Notes (Signed)
Subjective:    Patient ID: [name], male    DOB: 03-02-1941, 75 y.o.   MRN: [id number]  HPI Patient here with complaints of questionable intermittent left upper extremity weakness. For example, he was on the computer the other day and after several minutes noticed after typing left hand seemed somewhat weaker. Symptoms are very intermittent. He notices more than anything easy fatigability of the left arm at times. Right-hand dominant. He has some chronic left-sided sciatica symptoms but no acute left lower extremity weakness. No recent speech changes. Denies radiculopathy symptoms. Denies upper extremity numbness. He has known osteoarthritis of the neck  Separate issue of slightly pruritic slightly scaly hyperpigmented area left upper lateral leg. No injury.  Has not tried anything topical.   Past Medical History  Diagnosis Date  . Allergy   . CAD (coronary artery disease)   . Hyperlipidemia   . Diverticulosis of colon   . Benign prostatic hypertrophy   . Anxiety   . History of colon polyps   . Depression   . Kidney stones     "I've had them twice; last time was ~ 9 yr ago; both times passed on their own" (2012/10/11)  . Pneumonia     "I think I had as a child" (10/11/12)  . ITP (idiopathic thrombocytopenic purpura)   . Factor VII deficiency ([place])   . History of blood transfusion 1941  . Arthritis     "all over" (2012/10/11)  . Chronic lower back pain     "from a herniated disc" (Oct 11, 2012)  . Myocardial infarction ([place]) 2009  . T-cell lymphoma ([place])     "I'm in stage I; original site was on my left chest; had a place around my waist treated last summer; got a place on back of my left leg" (10/11/2012)  . Squamous carcinoma ([place])     "left side; top of head" (Oct 11, 2012)  . Basal cell carcinoma of head     "right side; top of head" (2012-10-11)   Past Surgical History  Procedure Laterality Date  . Vasectomy    . Cataract extraction w/ intraocular lens  implant,  bilateral Bilateral 2000's  . Mohs surgery Bilateral 2011-2012    "top of my head" (10-11-12)  . Colonoscopy    . Coronary angioplasty with stent placement  05/29/2007    "1" (2012/10/11)  . Cardiac catheterization  Oct 11, 2012  . Tonsillectomy  ~ 1945  . Cyst excision  1980's?    "or fatty tumor; taken off my back" (10-11-12)  . Lumbar epidural injection  ~ 2004  . Left heart catheterization with coronary angiogram N/A 10-11-12    Procedure: LEFT HEART CATHETERIZATION WITH CORONARY ANGIOGRAM;  Surgeon: Burnell Blanks, MD;  Location: [facility] CATH LAB;  Service: Cardiovascular;  Laterality: N/A;    reports that he has never smoked. He has never used smokeless tobacco. He reports that he does not drink alcohol or use illicit drugs. family history includes Alzheimer's disease in his mother; COPD in his brother; Colon cancer in his paternal uncle; Coronary artery disease in his brother; Diabetes in his brother and brother; Lymphoma in his father. Allergies  Allergen Reactions  . Other Other (See Comments)    Antidepressants...has a variety of reactions to different antidepressants  . Prozac [Fluoxetine Hcl] Other (See Comments)    nightmares      Review of Systems  Constitutional: Negative for fatigue.  Eyes: Negative for visual disturbance.  Respiratory: Negative for cough, chest tightness and shortness  of breath.   Cardiovascular: Negative for chest pain, palpitations and leg swelling.  Musculoskeletal: Positive for neck stiffness.  Neurological: Positive for tremors (intention tremor which is not new). Negative for dizziness, syncope, light-headedness and headaches. Weakness: see history of present illness.       Objective:   Physical Exam  Constitutional: He appears well-developed and well-nourished.  Cardiovascular: Normal rate and regular rhythm.   Pulmonary/Chest: Effort normal and breath sounds normal. No respiratory distress. He has no wheezes. He has no rales.    Musculoskeletal: Normal range of motion. He exhibits no edema.  Neurological: He displays normal reflexes.  Full strength upper extremities. No obvious muscle atrophy. Symmetric reflexes.  Skin:  Left lateral leg. Approximately 1 cm minimally scaly area of hyperpigmentation. No irregular features.          Assessment & Plan:  #1 possible mild intermittent left upper extremity weakness. Question related to degenerative arthritis in the neck. He does not have any features to suggest carpal tunnel or peripheral nerve defect and no evidence for recent CVA or TIA. We recommend observation this time. His neurologic exam currently is non-focal Consider start with neck films for any progressive weakness or numbness.  #2 nonspecific pigmented area left lateral leg. No atypical features. Suspect related to recent post-inflammation change.  He has mild scaling. Try over-the-counter hydrocortisone cream.  Eulas Post MD [facility] at [facility]

## 2015-09-16 NOTE — Progress Notes (Signed)
Pre visit review using our clinic review tool, if applicable. No additional management support is needed unless otherwise documented below in the visit note. 

## 2015-09-16 NOTE — Patient Instructions (Signed)
Follow up for any progressive weakness or numbness or left arm- or any new left leg symptoms Suspect cervical neck arthritis may be be accounting for left arm symptoms.

## 2015-10-20 DIAGNOSIS — M170 Bilateral primary osteoarthritis of knee: Secondary | ICD-10-CM | POA: Diagnosis not present

## 2015-10-20 DIAGNOSIS — M1711 Unilateral primary osteoarthritis, right knee: Secondary | ICD-10-CM | POA: Diagnosis present

## 2015-10-20 NOTE — H&P (Deleted)
PREOPERATIVE H&P Patient ID: [name] MRN: [id number] DOB/AGE: 1941/02/10 75 y.o.  Chief Complaint: OA RIGHT KNEE  Planned Procedure Date: 11/08/15  Medical Clearance by Dr. Elease Hashimoto   Cardiac Clearance by Dr. Angelena Form Additional clearance by Heme/Onc Dr. Beryle Beams   HPI: [name] is a 75 y.o. male with a history of MI w/ LAD stenting 2009, ITP, Factor VII, T-cell lymphoma who presents for evaluation of OA RIGHT KNEE. The patient has a history of pain and functional disability in the right knee due to arthritis and has failed non-surgical conservative treatments for greater than 12 weeks to include corticosteriod injections, viscosupplementation injections and activity modification.  Onset of symptoms was gradual, starting 2 years ago with gradually worsening course since that time. The patient noted no past surgery on the right knee.  Patient currently rates pain at 7 out of 10 with activity. Patient has worsening of pain with activity and weight bearing, pain that interferes with activities of daily living and pain with passive range of motion.  X rays show severe OA bilaterally, R>L and  joint space narrowing stress X-rays of both knees demonstrate good opening of his medial side. There is no active infection.  Past Medical History  Diagnosis Date  . Allergy   . CAD (coronary artery disease)   . Hyperlipidemia   . Diverticulosis of colon   . Benign prostatic hypertrophy   . Anxiety   . History of colon polyps   . Depression   . Kidney stones     "I've had them twice; last time was ~ 9 yr ago; both times passed on their own" (10-14-2012)  . Pneumonia     "I think I had as a child" (10-14-12)  . ITP (idiopathic thrombocytopenic purpura)   . Factor VII deficiency ([place])   . History of blood transfusion 1941  . Arthritis     "all over" (2012/10/14)  . Chronic lower back pain     "from a herniated disc" (10/14/12)  . Myocardial infarction ([place]) 2009  . T-cell lymphoma ([place])      "I'm in stage I; original site was on my left chest; had a place around my waist treated last summer; got a place on back of my left leg" (10/14/2012)  . Squamous carcinoma ([place])     "left side; top of head" (October 14, 2012)  . Basal cell carcinoma of head     "right side; top of head" (10/14/2012)   Past Surgical History  Procedure Laterality Date  . Vasectomy    . Cataract extraction w/ intraocular lens  implant, bilateral Bilateral 2000's  . Mohs surgery Bilateral 2011-2012    "top of my head" (October 14, 2012)  . Colonoscopy    . Coronary angioplasty with stent placement  05/29/2007    "1" (10-14-2012)  . Cardiac catheterization  10-14-12  . Tonsillectomy  ~ 1945  . Cyst excision  1980's?    "or fatty tumor; taken off my back" (10-14-2012)  . Lumbar epidural injection  ~ 2004  . Left heart catheterization with coronary angiogram N/A 2012/10/14    Procedure: LEFT HEART CATHETERIZATION WITH CORONARY ANGIOGRAM;  Surgeon: Burnell Blanks, MD;  Location: [facility] CATH LAB;  Service: Cardiovascular;  Laterality: N/A;   Allergies  Allergen Reactions  . Other Other (See Comments)    Antidepressants...has a variety of reactions to different antidepressants  . Prozac [Fluoxetine Hcl] Other (See Comments)    nightmares   Prior to Admission medications   Medication Sig Start  Date End Date Taking? Authorizing Provider  acetaminophen (TYLENOL) 650 MG CR tablet Take 650 mg by mouth 2 (two) times daily.     Historical Provider, MD  ALPRAZolam Duanne Moron) 0.5 MG tablet Take 1 tablet (0.5 mg total) by mouth 2 (two) times daily as needed for anxiety (for sleep). 08/15/15   Eulas Post, MD  aspirin 81 MG tablet Take 81 mg by mouth daily.      Historical Provider, MD  atorvastatin (LIPITOR) 20 MG tablet Take 1 tablet (20 mg total) by mouth daily. 03/16/15   Burnell Blanks, MD  carvedilol (COREG) 3.125 MG tablet Take 1 tablet (3.125 mg total) by mouth 2 (two) times daily with a meal. 03/16/15    Burnell Blanks, MD  Cholecalciferol (VITAMIN D3) 1000 UNITS tablet Take 1,000 Units by mouth 2 (two) times daily.     Historical Provider, MD  Cyanocobalamin (VITAMIN B 12 PO) Take 500 mg by mouth daily.    Historical Provider, MD  doxycycline (VIBRA-TABS) 100 MG tablet Take 1 tablet (100 mg total) by mouth 2 (two) times daily. 08/15/15   Eulas Post, MD  losartan (COZAAR) 50 MG tablet Take 1 tablet (50 mg total) by mouth daily. 03/16/15   Burnell Blanks, MD  Magnesium 250 MG TABS Take 250 mg by mouth daily.     Historical Provider, MD  nitroGLYCERIN (NITROSTAT) 0.4 MG SL tablet Place 1 tablet (0.4 mg total) under the tongue every 5 (five) minutes as needed for chest pain (for chest pain). 03/16/15   Burnell Blanks, MD  Omega-3 Fatty Acids (FISH OIL) 1000 MG CAPS Take 1,000 mg by mouth 2 (two) times daily.     Historical Provider, MD  Saw Palmetto 450 MG CAPS Take 450 mg by mouth 2 (two) times daily.     Historical Provider, MD  Ubiquinol 100 MG CAPS Take 1 capsule by mouth daily.      Historical Provider, MD   Social History   Social History  . Marital Status: Married    Spouse Name: N/A  . Number of Children: 2  . Years of Education: N/A   Occupational History  . Retired    Social History Main Topics  . Smoking status: Never Smoker   . Smokeless tobacco: Never Used  . Alcohol Use: No  . Drug Use: No  . Sexual Activity: Not Currently   Other Topics Concern  . Not on file   Social History Narrative   No caffeine    Family History  Problem Relation Age of Onset  . Alzheimer's disease Mother   . Lymphoma Father   . Coronary artery disease Brother   . COPD Brother   . Diabetes Brother   . Colon cancer Paternal Uncle   . Diabetes Brother     ROS: Currently denies lightheadedness, dizziness, Fever, chills, CP, SOB.  No personal history of DVT, PE, or CVA.  MI w/ stenting 2009. No loose teeth or dentures All other systems have been reviewed and  were otherwise negative with the exception of those mentioned in the HPI and as above.  Objective: Vitals: Ht: 5'6" Wt: 141 Temp: 97.8 BP: 122/73 Pulse: 58 O2 99% on room air. Physical Exam: General: Alert, NAD.  Antalgic Gait  HEENT: EOMI, Good Neck Extension  Pulm: No increased work of breathing.  Clear B/L A/P w/o crackle or wheeze.  CV: RRR GI: soft, NT, ND Neuro: Neuro grossly intact b/l upper/lower ext.  Sensation intact distally  Skin: No lesions in the area of chief complaint MSK/Surgical Site: Well appearing male no apparent distress. He has medial sided joint line pain with pain with range of motion and a varus load. No pain with range of motion under a valgus load.  Neurovascularly intact distally.   Imaging Review Plain radiographs demonstrate severe degenerative medial joint OA bilaterally, R>L and joint space narrowing.  Stress X-rays of both knees demonstrate good opening of his medial side.   Assessment: OA RIGHT KNEE  Plan: Plan for Procedure(s): RIGHT UNICOMPARTMENTAL KNEE ARTHROPLASTY  The patient history, physical exam, clinical judgement of the provider and imaging are consistent with end stage degenerative joint disease and unicompartmental joint arthroplasty is deemed medically necessary. The treatment options including medical management, injection therapy, and arthroplasty were discussed at length. The risks and benefits of Procedure(s): RIGHT UNICOMPARTMENTAL KNEE ARTHROPLASTY was presented and reviewed.  The risks of nonoperative treatment, versus surgical intervention including but not limited to continued pain, aseptic loosening, stiffness, dislocation/subluxation, infection, bleeding, nerve injury, blood clots, cardiopulmonary complications, morbidity, mortality, among others were discussed. The patient verbalizes understanding and wishes to proceed with the plan.  Patient is being admitted for inpatient treatment for surgery, pain control, PT, OT, prophylactic  antibiotics, VTE prophylaxis, progressive ambulation, ADL's and discharge planning.   Dental prophylaxis discussed and recommended for 2 years postoperatively.  The patient does not meet the criteria for TXA. Xarelto  will be used postoperatively for DVT prophylaxis in addition to SCDs, and early ambulation. The patient is planning to be discharged home with home health services in care of his wife.  Charna Elizabeth Martensen III,PA-C 10/20/2015 5:54 PM

## 2015-10-27 NOTE — Pre-Procedure Instructions (Signed)
[name]  10/27/2015      [facility] [place] [facility] [address] [place] [address] Phone: [phone] Fax: [phone]  [id number] [place] [place] - [address] [place] [address] Phone: [phone] Fax: [phone]    Your procedure is scheduled on June 27  Report to [facility] Admitting at 100 pm   Call this number if you have problems the morning of surgery:  [phone]   Remember:  Do not eat food or drink liquids after midnight.  Take these medicines the morning of surgery with A SIP OF WATER tylenol if needed, carvedilol (Coreg), Nitrostat if needed  Stop taking Aspirin, BC's, Goody's, Herbal medications, Fish Oil, Ibuprofen, Advil, Motrin, Aleve   Do not wear jewelry, make-up or nail polish.  Do not wear lotions, powders, or perfumes.  You may wear deoderant.  Do not shave 48 hours prior to surgery.  Men may shave face and neck.  Do not bring valuables to the hospital.  [facility] is not responsible for any belongings or valuables.  Contacts, dentures or bridgework may not be worn into surgery.  Leave your suitcase in the car.  After surgery it may be brought to your room.  For patients admitted to the hospital, discharge time will be determined by your treatment team.  Patients discharged the day of surgery will not be allowed to drive home.    Special instructions:  [facility] - Preparing for Surgery  Before surgery, you can play an important role.  Because skin is not sterile, your skin needs to be as free of germs as possible.  You can reduce the number of germs on you skin by washing with CHG (chlorahexidine gluconate) soap before surgery.  CHG is an antiseptic cleaner which kills germs and bonds with the skin to continue killing germs even after washing.  Please DO NOT use  if you have an allergy to CHG or antibacterial soaps.  If your skin becomes reddened/irritated stop using the CHG and inform your nurse when you arrive at Short Stay.  Do not shave (including legs and underarms) for at least 48 hours prior to the first CHG shower.  You may shave your face.  Please follow these instructions carefully:   1.  Shower with CHG Soap the night before surgery and the    morning of Surgery.  2.  If you choose to wash your hair, wash your hair first as usual with your  normal shampoo.  3.  After you shampoo, rinse your hair and body thoroughly to remove the  Shampoo.  4.  Use CHG as you would any other liquid soap.  You can apply chg directly to the skin and wash gently with scrungie or a clean washcloth.  5.  Apply the CHG Soap to your body ONLY FROM THE NECK DOWN.   Do not use on open wounds or open sores.  Avoid contact with your eyes, ears, mouth and genitals (private parts).  Wash genitals (private parts) with your normal soap.  6.  Wash thoroughly, paying special attention to the area where your surgery  will be performed.  7.  Thoroughly rinse your body with warm water from the neck down.  8.  DO NOT shower/wash with your normal soap after using and rinsing off  the CHG Soap.  9.  Pat yourself dry with a clean towel.            10.  Wear clean pajamas.            11.  Place clean sheets on your bed the night of your first shower and do not sleep with pets.  Day of Surgery  Do not apply any lotions/deoderants the morning of surgery.  Please wear clean clothes to the hospital/surgery center.    Please read over the following fact sheets that you were given. Pain Booklet, Blood Transfusion Information, MRSA Information and Surgical Site Infection Prevention, Incentive Spirometry

## 2015-10-28 ENCOUNTER — Encounter (HOSPITAL_COMMUNITY)
Admission: RE | Admit: 2015-10-28 | Discharge: 2015-10-28 | Disposition: A | Discharge status: Home / Self Care | Payer: Medicare Other | Source: Ambulatory Visit | Attending: Orthopedic Surgery | Admitting: Orthopedic Surgery

## 2015-10-28 ENCOUNTER — Encounter (HOSPITAL_COMMUNITY): Payer: Self-pay

## 2015-10-28 DIAGNOSIS — Z01812 Encounter for preprocedural laboratory examination: Secondary | ICD-10-CM | POA: Diagnosis not present

## 2015-10-28 DIAGNOSIS — D693 Immune thrombocytopenic purpura: Secondary | ICD-10-CM | POA: Diagnosis not present

## 2015-10-28 DIAGNOSIS — I2510 Atherosclerotic heart disease of native coronary artery without angina pectoris: Secondary | ICD-10-CM | POA: Insufficient documentation

## 2015-10-28 DIAGNOSIS — Z79899 Other long term (current) drug therapy: Secondary | ICD-10-CM | POA: Diagnosis not present

## 2015-10-28 DIAGNOSIS — Z0183 Encounter for blood typing: Secondary | ICD-10-CM | POA: Diagnosis not present

## 2015-10-28 DIAGNOSIS — Z7982 Long term (current) use of aspirin: Secondary | ICD-10-CM | POA: Diagnosis not present

## 2015-10-28 DIAGNOSIS — E785 Hyperlipidemia, unspecified: Secondary | ICD-10-CM | POA: Insufficient documentation

## 2015-10-28 DIAGNOSIS — Z8572 Personal history of non-Hodgkin lymphomas: Secondary | ICD-10-CM | POA: Diagnosis not present

## 2015-10-28 DIAGNOSIS — Z85828 Personal history of other malignant neoplasm of skin: Secondary | ICD-10-CM | POA: Diagnosis not present

## 2015-10-28 DIAGNOSIS — M1711 Unilateral primary osteoarthritis, right knee: Secondary | ICD-10-CM | POA: Diagnosis not present

## 2015-10-28 DIAGNOSIS — Z01818 Encounter for other preprocedural examination: Secondary | ICD-10-CM | POA: Insufficient documentation

## 2015-10-28 DIAGNOSIS — Z955 Presence of coronary angioplasty implant and graft: Secondary | ICD-10-CM | POA: Insufficient documentation

## 2015-10-28 DIAGNOSIS — I252 Old myocardial infarction: Secondary | ICD-10-CM | POA: Diagnosis not present

## 2015-10-28 HISTORY — DX: Cardiac arrhythmia, unspecified: I49.9

## 2015-10-28 LAB — PROTIME-INR
INR: 1.06 (ref 0.00–1.49)
PROTHROMBIN TIME: 14 s (ref 11.6–15.2)

## 2015-10-28 LAB — COMPREHENSIVE METABOLIC PANEL
ALT: 27 U/L (ref 17–63)
AST: 25 U/L (ref 15–41)
Albumin: 3.8 g/dL (ref 3.5–5.0)
Alkaline Phosphatase: 76 U/L (ref 38–126)
Anion gap: 4 — ABNORMAL LOW (ref 5–15)
BUN: 16 mg/dL (ref 6–20)
CHLORIDE: 106 mmol/L (ref 101–111)
CO2: 31 mmol/L (ref 22–32)
Calcium: 9.3 mg/dL (ref 8.9–10.3)
Creatinine, Ser: 0.86 mg/dL (ref 0.61–1.24)
GFR calc Af Amer: >60 mL/min (ref >60)
Glucose, Bld: 87 mg/dL (ref 65–99)
POTASSIUM: 4.3 mmol/L (ref 3.5–5.1)
SODIUM: 141 mmol/L (ref 135–145)
Total Bilirubin: 1 mg/dL (ref 0.3–1.2)
Total Protein: 6 g/dL — ABNORMAL LOW (ref 6.5–8.1)

## 2015-10-28 LAB — CBC WITH DIFFERENTIAL/PLATELET
BASOS PCT: 0 %
Basophils Absolute: 0 10*3/uL (ref 0.0–0.1)
EOS ABS: 0.1 10*3/uL (ref 0.0–0.7)
EOS PCT: 2 %
HCT: 47.9 % (ref 39.0–52.0)
Hemoglobin: 15.1 g/dL (ref 13.0–17.0)
LYMPHS PCT: 29 %
Lymphs Abs: 1.5 10*3/uL (ref 0.7–4.0)
MCH: 32.5 pg (ref 26.0–34.0)
MCHC: 31.5 g/dL (ref 30.0–36.0)
MCV: 103.2 fL — AB (ref 78.0–100.0)
MONO ABS: 0.3 10*3/uL (ref 0.1–1.0)
Monocytes Relative: 6 %
Neutro Abs: 3.3 10*3/uL (ref 1.7–7.7)
Neutrophils Relative %: 63 %
PLATELETS: 102 10*3/uL — AB (ref 150–400)
RBC: 4.64 MIL/uL (ref 4.22–5.81)
RDW: 12.5 % (ref 11.5–15.5)
WBC: 5.2 10*3/uL (ref 4.0–10.5)

## 2015-10-28 LAB — URINALYSIS, ROUTINE W REFLEX MICROSCOPIC
BILIRUBIN URINE: NEGATIVE
GLUCOSE, UA: NEGATIVE mg/dL
Hgb urine dipstick: NEGATIVE
KETONES UR: NEGATIVE mg/dL
Leukocytes, UA: NEGATIVE
Nitrite: NEGATIVE
PH: 5.5 (ref 5.0–8.0)
Protein, ur: NEGATIVE mg/dL
Specific Gravity, Urine: 1.015 (ref 1.005–1.030)

## 2015-10-28 LAB — SURGICAL PCR SCREEN
MRSA, PCR: NEGATIVE
Staphylococcus aureus: NEGATIVE

## 2015-10-28 LAB — TYPE AND SCREEN
ABO/RH(D): A POS
ANTIBODY SCREEN: NEGATIVE

## 2015-10-28 LAB — APTT: APTT: 27 s (ref 24–37)

## 2015-10-28 LAB — ABO/RH: ABO/RH(D): A POS

## 2015-10-28 NOTE — Progress Notes (Addendum)
PCP is Dr Elease Hashimoto Oncology is Dr Beryle Beams Cardiologist is Dr Angelena Form States he had a sleep study that showed mild apnea, but was not given a bipap or cpap.  Clearance noted from Dr Angelena Form from 07-13-15 (media tab) Echo noted from 03-28-2012 Stress test and card cath noted from 2014 Pt states if he goes a long time without food and fluids he will get weak and "crash". Dr Percell Miller did tell the patient to come in early if needed to get iv started and get fluids. Pt wife states they may come in around 0900.

## 2015-10-28 NOTE — Progress Notes (Addendum)
Anesthesia PAT Evaluation: Patient is a 75 year old male scheduled for right unicompartmental knee on 11/08/15 by Dr. Edmonia Lynch.   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 Beryle Beams), cutaneous T-cell lymphoma (mycosis fungoides, stage 1), skin cancer (SCC, BCC), BPH, arthritis, tonsillectomy ~ '45, , tubular adenoma s/p colon resection.  PCP is Dr. Carolann Littler who is aware of plans for surgery.  Hematologist is Dr. Beryle Beams, last seen on 09/05/15.  He is aware of surgery plans and wrote, "Current platelet count 109,000 so I do not anticipate any problems with the surgery. It is also reasonable to use Xarelto 10 mg daily prophylactic dose for 4 weeks following surgery. He has normal renal function." (Patient reports Factor VII deficiency, although I don't see this documented. I have communicated with Dr. Beryle Beams who wrote, "He has no history of factor VII deficiency. This is also supported by his normal Protime...") Patient denied excessive bleeding with his MOHS procedure (in total took 7-8 hours) or with dental procedures. He did have two episodes where he had excessive bleeding (at least one in which he was on Plavix) that included a cut on his finger requiring ED evaluation and the other following a skin shaving/biopsy at his dermatologist that started bleeding during the night and required cauterization in the office the next day. He has had three steroid injections in his back without issues, but these were several years ago.   Cardiologist is Dr. Angelena Form, last visit 03/16/15. On 07/05/15 he wrote, "I don't see a reason why he could not have knee replacement if he is feeling well." He continues to feel well from a cardiac standpoint. 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  Meds  include Xanax, ASA 81mg  (holding day of surgery), Lipitor, Coreg, losartan, magnesium, Nitro, fish oil (on hold), saw palmetto (on hold). (I left a voice message with Claiborne Billings at Dr. Debroah Loop office to let patient know if he needs to hold ASA longer.)  PAT Vitals: BP 131/60, HR 56, RR 18, T 36.4C, O2 sat 100%. BMI 23.56. Patient is a pleasant Caucasian male in NAD. His wife is at his side. They are both wearing masks to protect themselves while inside the hospital. They have several questions today regarding anesthesia options. They both prefer that he have spinal anesthesia. They are concerned about anesthesia effects on his heart. I discussed with anesthesiologist Dr. Aris Lot. With his ITP history some anesthesiologist may feel that general anesthesia is a better option. However, spinal anesthesia may also be an option if his PLT count is > 100K and PT/PTT WNL. Dr. Beryle Beams states that patient does NOT have Factor VII deficiency. I told patient and his wife that the final anesthesia plan will have to be determined on the day of surgery following anesthesiologist evaluation.   03/16/15 EKG: SB, cannot rule out anterior infarct (age undetermined).  10/03/12 Cardiac cath: Angiographic Findings: Left main: No obstructive disease.  Left Anterior Descending Artery: Large caliber vessel that courses to the apex. The proximal vessel has mild plaque disease. The mid stented segment is patent. The small caliber diagonal branch is patent with ostial 70% stenosis as it is jailed by the stent and unchanged in appearance.  Circumflex Artery: Moderate caliber vessel with moderate caliber obtuse marginal branch with no significant disease. The AV  groove Circumflex has a 40% stenosis just after the takeoff of the OM branch, unchanged.  Right Coronary Artery: Large, dominant vessel with serial 20% lesions throughout the proximal vessel. The mid vessel has a 40% stenosis. The distal vessel has mild plaque disease.   Left Ventricular Angiogram: LVEF 55% with anterior and apical hypokinesis.  Impression: 1. Stable single vessel CAD 2. Segmental wall motion abnormality of the anterior wall after anterior MI in 2009, overall preserved LV function.  3. Non-cardiac chest pain.  Recommendations: No clear etiology of his dizziness and chest pain.  03/28/12 Echo: Study Conclusions - Left ventricle: The cavity size was normal. Wall thickness was normal. Systolic function was mildly to moderately reduced. The estimated ejection fraction was in the range of 40% to 45%. There is akinesis of the apical myocardium. Doppler parameters are consistent with abnormal left ventricular relaxation (grade 1 diastolic dysfunction). - Pulmonary arteries: PA peak pressure: 22mm Hg (S).  09/2012 48 hour Holter monitor: SR, SB (lowest 42 bpm), several short runs of SVT.  10/09/12 Carotid U/S: 0-39% BICA stenosis. Patent vertebral artery flow.  Preoperative labs noted. PLT count stable at 102K, stable. Cr 0.86. PT/PTT WNL. T&S done.   Further evaluation on the day of surgery by his assigned anesthesiologist to discuss the definitive anesthesia plan.  George Hugh [facility] Short Stay Center/Anesthesiology Phone [phone] 10/28/2015 5:38 PM

## 2015-10-31 ENCOUNTER — Telehealth: Payer: Self-pay | Admitting: Cardiovascular Disease

## 2015-10-31 NOTE — Telephone Encounter (Signed)
Pt  having knee replacment surgery 10-2715 and was told not to take his Cozar  And wants to make sure this is ok with Dr. Ethlyn Gallery advise  [phone]

## 2015-10-31 NOTE — Telephone Encounter (Signed)
It should be ok if this is the plan that was outlined by the anesthesia team. Thanks, chris

## 2015-10-31 NOTE — Telephone Encounter (Signed)
PT CALLED WANTING TO KNOW IF THIS IS OKAY TO HOLD  COZAAR . CALLED AND  SPOKE  TO  [place]  AT [phone] AND THIS  WAS INSTRUCTED TO  PT .PER  [facility]  ANESTHESIA HAVE  PT'S HOLD  LOSARTAN  AND  TAKE  BETA BLOCKERS WAS NOT  AWARE  OF  THIS  WILL FORWARD TO DR  Angelena Form FOR REVIEW .Adonis Housekeeper

## 2015-10-31 NOTE — Anesthesia Preprocedure Evaluation (Addendum)
Anesthesia Evaluation   Patient awake    Reviewed: Allergy & Precautions, NPO status , Patient's Chart, lab work & pertinent test results  History of Anesthesia Complications Negative for: history of anesthetic complications  Airway Mallampati: II  TM Distance: >3 FB Neck ROM: Full    Dental  (+) Teeth Intact   Pulmonary neg shortness of breath, sleep apnea , neg COPD, neg recent URI,    breath sounds clear to auscultation       Cardiovascular hypertension, Pt. on medications and Pt. on home beta blockers + CAD, + Past MI and + Cardiac Stents  + dysrhythmias Supra Ventricular Tachycardia  Rhythm:Regular     Neuro/Psych PSYCHIATRIC DISORDERS Anxiety Depression negative neurological ROS     GI/Hepatic negative GI ROS, Neg liver ROS,   Endo/Other  negative endocrine ROS  Renal/GU Renal disease     Musculoskeletal  (+) Arthritis ,   Abdominal   Peds  Hematology  (+) Blood dyscrasia, ,   Anesthesia Other Findings Anesthesia PAT Evaluation: Patient is a 75 year old male scheduled for right unicompartmental knee on 11/08/15 by Dr. Edmonia Lynch.   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 Beryle Beams), cutaneous T-cell lymphoma (mycosis fungoides, stage 1), skin cancer (SCC, BCC), BPH, arthritis, tonsillectomy ~ '45, , tubular adenoma s/p colon resection.  PCP is Dr. Carolann Littler who is aware of plans for surgery.  Hematologist is Dr. Beryle Beams, last seen on 09/05/15. He is aware of surgery plans and wrote, "Current platelet count 109,000 so I do not anticipate any problems with the surgery. It is also reasonable to use Xarelto 10 mg daily prophylactic dose for 4 weeks following surgery. He has normal renal function." (Patient reports Factor VII deficiency, although I don't see this documented. I have  communicated with Dr. Beryle Beams who wrote, "He has no history of factor VII deficiency. This is also supported by his normal Protime...") Patient denied excessive bleeding with his MOHS procedure (in total took 7-8 hours) or with dental procedures. He did have two episodes where he had excessive bleeding (at least one in which he was on Plavix) that included a cut on his finger requiring ED evaluation and the other following a skin shaving/biopsy at his dermatologist that started bleeding during the night and required cauterization in the office the next day. He has had three steroid injections in his back without issues, but these were several years ago.   Cardiologist is Dr. Angelena Form, last visit 03/16/15. On 07/05/15 he wrote, "I don't see a reason why he could not have knee replacement if he is feeling well." He continues to feel well from a cardiac standpoint. 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  Meds include Xanax, ASA 81mg  (holding day of surgery), Lipitor, Coreg, losartan, magnesium, Nitro, fish oil (on hold), saw palmetto (on hold). (I left a voice message with Claiborne Billings at Dr. Debroah Loop office to let patient know if he needs to hold ASA longer.)  PAT Vitals: BP 131/60, HR 56, RR 18, T 36.4C, O2 sat 100%. BMI 23.56. Patient is a pleasant Caucasian male in NAD. His wife is at his side. They are both wearing masks to protect themselves while inside the hospital. They have several questions today regarding anesthesia options. They both prefer that he have spinal anesthesia.  They are concerned about anesthesia effects on his heart. I discussed with anesthesiologist Dr. Aris Lot. With his ITP history some anesthesiologist may feel that general anesthesia is a better option. However, spinal anesthesia may also be an option if his PLT count is > 100K and PT/PTT WNL. Dr. Beryle Beams states that patient does NOT have  Factor VII deficiency. I told patient and his wife that the final anesthesia plan will have to be determined on the day of surgery following anesthesiologist evaluation.   03/16/15 EKG: SB, cannot rule out anterior infarct (age undetermined).  10/03/12 Cardiac cath: Angiographic Findings: Left main: No obstructive disease.  Left Anterior Descending Artery: Large caliber vessel that courses to the apex. The proximal vessel has mild plaque disease. The mid stented segment is patent. The small caliber diagonal branch is patent with ostial 70% stenosis as it is jailed by the stent and unchanged in appearance.  Circumflex Artery: Moderate caliber vessel with moderate caliber obtuse marginal branch with no significant disease. The AV groove Circumflex has a 40% stenosis just after the takeoff of the OM branch, unchanged.  Right Coronary Artery: Large, dominant vessel with serial 20% lesions throughout the proximal vessel. The mid vessel has a 40% stenosis. The distal vessel has mild plaque disease.  Left Ventricular Angiogram: LVEF 55% with anterior and apical hypokinesis.  Impression: 1. Stable single vessel CAD 2. Segmental wall motion abnormality of the anterior wall after anterior MI in 2009, overall preserved LV function.  3. Non-cardiac chest pain.  Recommendations: No clear etiology of his dizziness and chest pain.  03/28/12 Echo: Study Conclusions - Left ventricle: The cavity size was normal. Wall thickness was normal. Systolic function was mildly to moderately reduced. The estimated ejection fraction was in the range of 40% to 45%. There is akinesis of the apical myocardium. Doppler parameters are consistent with abnormal left ventricular relaxation (grade 1 diastolic dysfunction). - Pulmonary arteries: PA peak pressure: 5mm Hg (S).  09/2012 48 hour Holter monitor: SR, SB (lowest 42 bpm), several short runs of SVT.  10/09/12 Carotid U/S: 0-39% BICA stenosis. Patent  vertebral artery flow.  Preoperative labs noted. PLT count stable at 102K, stable. Cr 0.86. PT/PTT WNL. T&S done.   Further evaluation on the day of surgery by his assigned anesthesiologist to discuss the definitive anesthesia plan.  Reproductive/Obstetrics                            Anesthesia Physical Anesthesia Plan  ASA: III  Anesthesia Plan: Spinal   Post-op Pain Management:    Induction:   Airway Management Planned: Natural Airway, Nasal Cannula and Simple Face Mask  Additional Equipment: None  Intra-op Plan:   Post-operative Plan:   Informed Consent: I have reviewed the patients History and Physical, chart, labs and discussed the procedure including the risks, benefits and alternatives for the proposed anesthesia with the patient or authorized representative who has indicated his/her understanding and acceptance.   Dental advisory given  Plan Discussed with: CRNA and Surgeon  Anesthesia Plan Comments: (See my anesthesia note. Patient prefers spinal anesthesia, although instructed that definitive plan to be discussed following evaluation by his anesthesiologist on the day of surgery. Myra Gianotti, PA-C  Discussed lab studies and h/o coagulopathy with patient and wife. Explained risk and benefits of neuroaxial anesthesia and in setting of PLT > 100k and nl coags without anticoagulation decision was made to proceed with spinal anesthesia. )  Anesthesia Quick Evaluation  

## 2015-10-31 NOTE — Telephone Encounter (Signed)
PT AWARE OF  DR MCALHANY'S RESPONSE./CY

## 2015-11-04 NOTE — H&P (Signed)
PREOPERATIVE H&P Patient ID: [name] MRN: [id number] DOB/AGE: [birth year] 77 y.o.  Chief Complaint: OA RIGHT KNEE  Planned Procedure Date: 11/08/15  Medical Clearance by Dr. Elease Hashimoto   Cardiac Clearance by Dr. Angelena Form Additional clearance by Heme/Onc Dr. Beryle Beams   HPI: [name] is a 75 y.o. male with a history of MI w/ LAD stenting 2009, ITP, Factor VII, T-cell lymphoma who presents for evaluation of OA RIGHT KNEE. The patient has a history of pain and functional disability in the right knee due to arthritis and has failed non-surgical conservative treatments for greater than 12 weeks to include corticosteriod injections, viscosupplementation injections and activity modification.  Onset of symptoms was gradual, starting 2 years ago with gradually worsening course since that time. The patient noted no past surgery on the right knee.  Patient currently rates pain at 7 out of 10 with activity. Patient has worsening of pain with activity and weight bearing, pain that interferes with activities of daily living and pain with passive range of motion.  X rays show severe OA bilaterally, R>L and  joint space narrowing stress X-rays of both knees demonstrate good opening of his medial side. There is no active infection.  Past Medical History  Diagnosis Date  . Allergy   . CAD (coronary artery disease)   . Hyperlipidemia   . Diverticulosis of colon   . Benign prostatic hypertrophy   . Anxiety   . History of colon polyps   . Depression   . Kidney stones     "I've had them twice; last time was ~ 9 yr ago; both times passed on their own" (Oct 29, 2012)  . Pneumonia     "I think I had as a child" (October 29, 2012)  . ITP (idiopathic thrombocytopenic purpura)   . Factor VII deficiency ([place])     10/28/15: No history of factor VII deficiency per Dr. Beryle Beams  . History of blood transfusion 1941  . Arthritis     "all over" (2012-10-29)  . Chronic lower back pain     "from a herniated disc"  (10-29-12)  . Myocardial infarction ([place]) 2009  . T-cell lymphoma ([place])     "I'm in stage I; original site was on my left chest; had a place around my waist treated last summer; got a place on back of my left leg" (10-29-12)  . Squamous carcinoma ([place])     "left side; top of head" (October 29, 2012)  . Basal cell carcinoma of head     "right side; top of head" (10-29-2012)  . Dysrhythmia    Past Surgical History  Procedure Laterality Date  . Vasectomy    . Cataract extraction w/ intraocular lens  implant, bilateral Bilateral 2000's  . Mohs surgery Bilateral 2011-2012    "top of my head" (Oct 29, 2012)  . Colonoscopy    . Coronary angioplasty with stent placement  05/29/2007    "1" (2012/10/29)  . Cardiac catheterization  2012-10-29  . Tonsillectomy  ~ 1945  . Cyst excision  1980's?    "or fatty tumor; taken off my back" (2012/10/29)  . Lumbar epidural injection  ~ 2004  . Left heart catheterization with coronary angiogram N/A 10/29/2012    Procedure: LEFT HEART CATHETERIZATION WITH CORONARY ANGIOGRAM;  Surgeon: Burnell Blanks, MD;  Location: [facility] CATH LAB;  Service: Cardiovascular;  Laterality: N/A;   Allergies  Allergen Reactions  . Other Other (See Comments)    Antidepressants...has a variety of reactions to different antidepressants  . Prozac [Fluoxetine Hcl] Other (  See Comments)    nightmares   Prior to Admission medications   Medication Sig Start Date End Date Taking? Authorizing Provider  acetaminophen (TYLENOL) 650 MG CR tablet Take 650 mg by mouth 2 (two) times daily.     Historical Provider, MD  ALPRAZolam Duanne Moron) 0.5 MG tablet Take 1 tablet (0.5 mg total) by mouth 2 (two) times daily as needed for anxiety (for sleep). 08/15/15   Eulas Post, MD  aspirin 81 MG tablet Take 81 mg by mouth daily.      Historical Provider, MD  atorvastatin (LIPITOR) 20 MG tablet Take 1 tablet (20 mg total) by mouth daily. 03/16/15   Burnell Blanks, MD  carvedilol (COREG) 3.125 MG  tablet Take 1 tablet (3.125 mg total) by mouth 2 (two) times daily with a meal. 03/16/15   Burnell Blanks, MD  Cholecalciferol (VITAMIN D3) 1000 UNITS tablet Take 1,000 Units by mouth 2 (two) times daily.     Historical Provider, MD  Cyanocobalamin (VITAMIN B 12 PO) Take 500 mg by mouth daily.    Historical Provider, MD  doxycycline (VIBRA-TABS) 100 MG tablet Take 1 tablet (100 mg total) by mouth 2 (two) times daily. 08/15/15   Eulas Post, MD  losartan (COZAAR) 50 MG tablet Take 1 tablet (50 mg total) by mouth daily. 03/16/15   Burnell Blanks, MD  Magnesium 250 MG TABS Take 250 mg by mouth daily.     Historical Provider, MD  nitroGLYCERIN (NITROSTAT) 0.4 MG SL tablet Place 1 tablet (0.4 mg total) under the tongue every 5 (five) minutes as needed for chest pain (for chest pain). 03/16/15   Burnell Blanks, MD  Omega-3 Fatty Acids (FISH OIL) 1000 MG CAPS Take 1,000 mg by mouth 2 (two) times daily.     Historical Provider, MD  Saw Palmetto 450 MG CAPS Take 450 mg by mouth 2 (two) times daily.     Historical Provider, MD  Ubiquinol 100 MG CAPS Take 1 capsule by mouth daily.      Historical Provider, MD   Social History   Social History  . Marital Status: Married    Spouse Name: N/A  . Number of Children: 2  . Years of Education: N/A   Occupational History  . Retired    Social History Main Topics  . Smoking status: Never Smoker   . Smokeless tobacco: Never Used  . Alcohol Use: No  . Drug Use: No  . Sexual Activity: Not Currently   Other Topics Concern  . Not on file   Social History Narrative   No caffeine    Family History  Problem Relation Age of Onset  . Alzheimer's disease Mother   . Lymphoma Father   . Coronary artery disease Brother   . COPD Brother   . Diabetes Brother   . Colon cancer Paternal Uncle   . Diabetes Brother     ROS: Currently denies lightheadedness, dizziness, Fever, chills, CP, SOB.  No personal history of DVT, PE, or CVA.  MI  w/ stenting 2009. No loose teeth or dentures All other systems have been reviewed and were otherwise negative with the exception of those mentioned in the HPI and as above.  Objective: Vitals: Ht: 5'6" Wt: 141 Temp: 97.8 BP: 122/73 Pulse: 58 O2 99% on room air. Physical Exam: General: Alert, NAD.  Antalgic Gait  HEENT: EOMI, Good Neck Extension  Pulm: No increased work of breathing.  Clear B/L A/P w/o crackle or wheeze.  CV: RRR GI: soft, NT, ND Neuro: Neuro grossly intact b/l upper/lower ext.  Sensation intact distally Skin: No lesions in the area of chief complaint MSK/Surgical Site: Well appearing male no apparent distress. He has medial sided joint line pain with pain with range of motion and a varus load. No pain with range of motion under a valgus load.  Neurovascularly intact distally.   Imaging Review Plain radiographs demonstrate severe degenerative medial joint OA bilaterally, R>L and joint space narrowing.  Stress X-rays of both knees demonstrate good opening of his medial side.   Assessment: OA RIGHT KNEE  Plan: Plan for Procedure(s): RIGHT UNICOMPARTMENTAL KNEE ARTHROPLASTY  The patient history, physical exam, clinical judgement of the provider and imaging are consistent with end stage degenerative joint disease and unicompartmental joint arthroplasty is deemed medically necessary. The treatment options including medical management, injection therapy, and arthroplasty were discussed at length. The risks and benefits of Procedure(s): RIGHT UNICOMPARTMENTAL KNEE ARTHROPLASTY was presented and reviewed.  The risks of nonoperative treatment, versus surgical intervention including but not limited to continued pain, aseptic loosening, stiffness, dislocation/subluxation, infection, bleeding, nerve injury, blood clots, cardiopulmonary complications, morbidity, mortality, among others were discussed. The patient verbalizes understanding and wishes to proceed with the plan.  Patient  is being admitted for inpatient treatment for surgery, pain control, PT, OT, prophylactic antibiotics, VTE prophylaxis, progressive ambulation, ADL's and discharge planning.   Dental prophylaxis discussed and recommended for 2 years postoperatively.  The patient not does meet the criteria for TXA. Xarelto for 14 days will be used postoperatively for DVT prophylaxis per Heme/Onc in addition to SCDs, and early ambulation. The patient is planning to be discharged home with 5 HHPT visits and then will help with OPPT transportation if needed.   Charna Elizabeth Martensen III,PA-C 11/04/2015 3:03 PM

## 2015-11-07 MED ORDER — ACETAMINOPHEN 500 MG PO TABS
1000.0000 mg | ORAL_TABLET | Freq: Once | ORAL | Status: AC
  Administered 2015-11-08: 1000 mg via ORAL

## 2015-11-07 MED ORDER — CEFAZOLIN SODIUM-DEXTROSE 2-4 GM/100ML-% IV SOLN
2.0000 g | INTRAVENOUS | Status: AC
  Administered 2015-11-08: 2 g via INTRAVENOUS
  Filled 2015-11-07: qty 100

## 2015-11-08 ENCOUNTER — Inpatient Hospital Stay (HOSPITAL_COMMUNITY): Payer: Medicare Other | Admitting: Anesthesiology

## 2015-11-08 ENCOUNTER — Inpatient Hospital Stay (HOSPITAL_COMMUNITY): Payer: Medicare Other

## 2015-11-08 ENCOUNTER — Inpatient Hospital Stay (HOSPITAL_COMMUNITY)
Admission: RE | Admit: 2015-11-08 | Discharge: 2015-11-10 | DRG: 470 | Disposition: A | Discharge status: Home Health | Payer: Medicare Other | Source: Ambulatory Visit | Attending: Orthopedic Surgery | Admitting: Orthopedic Surgery

## 2015-11-08 ENCOUNTER — Encounter (HOSPITAL_COMMUNITY): Payer: Self-pay | Admitting: *Deleted

## 2015-11-08 ENCOUNTER — Inpatient Hospital Stay (HOSPITAL_COMMUNITY): Payer: Medicare Other | Admitting: Vascular Surgery

## 2015-11-08 ENCOUNTER — Encounter (HOSPITAL_COMMUNITY)
Admission: RE | Disposition: A | Discharge status: Home Health | Payer: Self-pay | Source: Ambulatory Visit | Attending: Orthopedic Surgery

## 2015-11-08 DIAGNOSIS — E785 Hyperlipidemia, unspecified: Secondary | ICD-10-CM | POA: Diagnosis present

## 2015-11-08 DIAGNOSIS — C8400 Mycosis fungoides, unspecified site: Secondary | ICD-10-CM | POA: Diagnosis present

## 2015-11-08 DIAGNOSIS — Z79899 Other long term (current) drug therapy: Secondary | ICD-10-CM | POA: Diagnosis not present

## 2015-11-08 DIAGNOSIS — G8929 Other chronic pain: Secondary | ICD-10-CM | POA: Diagnosis present

## 2015-11-08 DIAGNOSIS — I2510 Atherosclerotic heart disease of native coronary artery without angina pectoris: Secondary | ICD-10-CM | POA: Diagnosis present

## 2015-11-08 DIAGNOSIS — D62 Acute posthemorrhagic anemia: Secondary | ICD-10-CM | POA: Diagnosis not present

## 2015-11-08 DIAGNOSIS — Z888 Allergy status to other drugs, medicaments and biological substances status: Secondary | ICD-10-CM

## 2015-11-08 DIAGNOSIS — D693 Immune thrombocytopenic purpura: Secondary | ICD-10-CM | POA: Diagnosis present

## 2015-11-08 DIAGNOSIS — Z955 Presence of coronary angioplasty implant and graft: Secondary | ICD-10-CM | POA: Diagnosis not present

## 2015-11-08 DIAGNOSIS — Z96651 Presence of right artificial knee joint: Secondary | ICD-10-CM | POA: Diagnosis not present

## 2015-11-08 DIAGNOSIS — I252 Old myocardial infarction: Secondary | ICD-10-CM | POA: Diagnosis not present

## 2015-11-08 DIAGNOSIS — Z8249 Family history of ischemic heart disease and other diseases of the circulatory system: Secondary | ICD-10-CM | POA: Diagnosis not present

## 2015-11-08 DIAGNOSIS — G4733 Obstructive sleep apnea (adult) (pediatric): Secondary | ICD-10-CM | POA: Diagnosis present

## 2015-11-08 DIAGNOSIS — Z85828 Personal history of other malignant neoplasm of skin: Secondary | ICD-10-CM

## 2015-11-08 DIAGNOSIS — Z807 Family history of other malignant neoplasms of lymphoid, hematopoietic and related tissues: Secondary | ICD-10-CM

## 2015-11-08 DIAGNOSIS — I10 Essential (primary) hypertension: Secondary | ICD-10-CM | POA: Diagnosis present

## 2015-11-08 DIAGNOSIS — D696 Thrombocytopenia, unspecified: Secondary | ICD-10-CM | POA: Diagnosis present

## 2015-11-08 DIAGNOSIS — I255 Ischemic cardiomyopathy: Secondary | ICD-10-CM | POA: Diagnosis present

## 2015-11-08 DIAGNOSIS — M5126 Other intervertebral disc displacement, lumbar region: Secondary | ICD-10-CM | POA: Diagnosis present

## 2015-11-08 DIAGNOSIS — Z800 Family history of malignant neoplasm of digestive organs: Secondary | ICD-10-CM

## 2015-11-08 DIAGNOSIS — Z471 Aftercare following joint replacement surgery: Secondary | ICD-10-CM | POA: Diagnosis not present

## 2015-11-08 DIAGNOSIS — N400 Enlarged prostate without lower urinary tract symptoms: Secondary | ICD-10-CM | POA: Diagnosis present

## 2015-11-08 DIAGNOSIS — M545 Low back pain: Secondary | ICD-10-CM | POA: Diagnosis not present

## 2015-11-08 DIAGNOSIS — R001 Bradycardia, unspecified: Secondary | ICD-10-CM | POA: Diagnosis present

## 2015-11-08 DIAGNOSIS — M1711 Unilateral primary osteoarthritis, right knee: Principal | ICD-10-CM | POA: Diagnosis present

## 2015-11-08 DIAGNOSIS — Z7982 Long term (current) use of aspirin: Secondary | ICD-10-CM | POA: Diagnosis not present

## 2015-11-08 DIAGNOSIS — I119 Hypertensive heart disease without heart failure: Secondary | ICD-10-CM | POA: Diagnosis present

## 2015-11-08 DIAGNOSIS — M179 Osteoarthritis of knee, unspecified: Secondary | ICD-10-CM | POA: Diagnosis not present

## 2015-11-08 HISTORY — PX: PARTIAL KNEE ARTHROPLASTY: SHX2174

## 2015-11-08 SURGERY — ARTHROPLASTY, KNEE, UNICOMPARTMENTAL
Anesthesia: Spinal | Site: Knee | Laterality: Right

## 2015-11-08 MED ORDER — ACETAMINOPHEN 325 MG PO TABS: 325.0000 mg | ORAL_TABLET | ORAL | Status: DC | PRN

## 2015-11-08 MED ORDER — ONDANSETRON HCL 4 MG/2ML IJ SOLN
4.0000 mg | Freq: Four times a day (QID) | INTRAMUSCULAR | Status: DC | PRN
  Administered 2015-11-09: 4 mg via INTRAVENOUS

## 2015-11-08 MED ORDER — BUPIVACAINE HCL (PF) 0.25 % IJ SOLN
INTRAMUSCULAR | Status: AC
  Filled 2015-11-08: qty 30

## 2015-11-08 MED ORDER — PROPOFOL 500 MG/50ML IV EMUL
INTRAVENOUS | Status: DC | PRN
  Administered 2015-11-08: 75 ug/kg/min via INTRAVENOUS

## 2015-11-08 MED ORDER — FENTANYL CITRATE (PF) 100 MCG/2ML IJ SOLN
INTRAMUSCULAR | Status: DC | PRN
  Administered 2015-11-08 (×2): 50 ug via INTRAVENOUS

## 2015-11-08 MED ORDER — FENTANYL CITRATE (PF) 100 MCG/2ML IJ SOLN
INTRAMUSCULAR | Status: AC
  Filled 2015-11-08: qty 2

## 2015-11-08 MED ORDER — ONDANSETRON HCL 4 MG/2ML IJ SOLN
INTRAMUSCULAR | Status: AC
  Filled 2015-11-08: qty 2

## 2015-11-08 MED ORDER — BUPIVACAINE HCL (PF) 0.25 % IJ SOLN
INTRAMUSCULAR | Status: DC | PRN
  Administered 2015-11-08: 30 mL

## 2015-11-08 MED ORDER — ONDANSETRON HCL 4 MG PO TABS
4.0000 mg | ORAL_TABLET | Freq: Four times a day (QID) | ORAL | Status: DC | PRN
  Administered 2015-11-09: 4 mg via ORAL
  Filled 2015-11-08: qty 1

## 2015-11-08 MED ORDER — FENTANYL CITRATE (PF) 250 MCG/5ML IJ SOLN
INTRAMUSCULAR | Status: AC
  Filled 2015-11-08: qty 5

## 2015-11-08 MED ORDER — OXYCODONE-ACETAMINOPHEN 5-325 MG PO TABS: 1.0000 | ORAL_TABLET | ORAL | Status: DC | PRN

## 2015-11-08 MED ORDER — BUPIVACAINE IN DEXTROSE 0.75-8.25 % IT SOLN
INTRATHECAL | Status: DC | PRN
  Administered 2015-11-08: 1.8 mL via INTRATHECAL

## 2015-11-08 MED ORDER — MIDAZOLAM HCL 5 MG/5ML IJ SOLN
INTRAMUSCULAR | Status: DC | PRN
  Administered 2015-11-08 (×2): 1 mg via INTRAVENOUS

## 2015-11-08 MED ORDER — ONDANSETRON HCL 4 MG/2ML IJ SOLN
INTRAMUSCULAR | Status: DC | PRN
  Administered 2015-11-08: 4 mg via INTRAVENOUS

## 2015-11-08 MED ORDER — ALPRAZOLAM 0.5 MG PO TABS
0.5000 mg | ORAL_TABLET | Freq: Two times a day (BID) | ORAL | Status: DC | PRN
  Administered 2015-11-09: 0.5 mg via ORAL
  Filled 2015-11-08: qty 1

## 2015-11-08 MED ORDER — PHENYLEPHRINE HCL 10 MG/ML IJ SOLN
INTRAMUSCULAR | Status: DC | PRN
Start: 2015-11-08 — End: 2015-11-08
  Administered 2015-11-08: 80 ug via INTRAVENOUS
  Administered 2015-11-08: 40 ug via INTRAVENOUS

## 2015-11-08 MED ORDER — GLYCOPYRROLATE 0.2 MG/ML IV SOSY
PREFILLED_SYRINGE | INTRAVENOUS | Status: AC
  Filled 2015-11-08: qty 3

## 2015-11-08 MED ORDER — LOSARTAN POTASSIUM 50 MG PO TABS
50.0000 mg | ORAL_TABLET | Freq: Every day | ORAL | Status: DC
  Administered 2015-11-09 – 2015-11-10 (×2): 50 mg via ORAL
  Filled 2015-11-08 (×3): qty 1

## 2015-11-08 MED ORDER — KETOROLAC TROMETHAMINE 30 MG/ML IJ SOLN
INTRAMUSCULAR | Status: AC
  Filled 2015-11-08: qty 1

## 2015-11-08 MED ORDER — ACETAMINOPHEN 325 MG PO TABS
650.0000 mg | ORAL_TABLET | Freq: Four times a day (QID) | ORAL | Status: DC | PRN
  Administered 2015-11-09 – 2015-11-10 (×2): 650 mg via ORAL
  Filled 2015-11-08 (×3): qty 2

## 2015-11-08 MED ORDER — PROPOFOL 1000 MG/100ML IV EMUL
INTRAVENOUS | Status: AC
  Filled 2015-11-08: qty 100

## 2015-11-08 MED ORDER — KETOROLAC TROMETHAMINE 30 MG/ML IJ SOLN
INTRAMUSCULAR | Status: DC | PRN
  Administered 2015-11-08: 30 mg via INTRA_ARTICULAR

## 2015-11-08 MED ORDER — OXYCODONE HCL 5 MG PO TABS
5.0000 mg | ORAL_TABLET | ORAL | Status: DC | PRN
  Administered 2015-11-08: 5 mg via ORAL
  Administered 2015-11-09 (×2): 10 mg via ORAL
  Administered 2015-11-09: 5 mg via ORAL
  Filled 2015-11-08: qty 2
  Filled 2015-11-08 (×2): qty 1
  Filled 2015-11-08: qty 2

## 2015-11-08 MED ORDER — MENTHOL 3 MG MT LOZG: 1.0000 | LOZENGE | OROMUCOSAL | Status: DC | PRN

## 2015-11-08 MED ORDER — RIVAROXABAN 10 MG PO TABS: 10.0000 mg | ORAL_TABLET | Freq: Every day | ORAL | Status: DC

## 2015-11-08 MED ORDER — ACETAMINOPHEN 160 MG/5ML PO SOLN
325.0000 mg | ORAL | Status: DC | PRN
  Filled 2015-11-08: qty 20.3

## 2015-11-08 MED ORDER — EPHEDRINE 5 MG/ML INJ
INTRAVENOUS | Status: AC
  Filled 2015-11-08: qty 10

## 2015-11-08 MED ORDER — ACETAMINOPHEN 500 MG PO TABS
ORAL_TABLET | ORAL | Status: AC
  Administered 2015-11-08: 1000 mg via ORAL
  Filled 2015-11-08: qty 2

## 2015-11-08 MED ORDER — MORPHINE SULFATE (PF) 2 MG/ML IV SOLN
2.0000 mg | INTRAVENOUS | Status: DC | PRN
  Administered 2015-11-08: 2 mg via INTRAVENOUS
  Filled 2015-11-08: qty 1

## 2015-11-08 MED ORDER — LACTATED RINGERS IV SOLN
INTRAVENOUS | Status: DC
  Administered 2015-11-08 (×2): via INTRAVENOUS

## 2015-11-08 MED ORDER — CHLORHEXIDINE GLUCONATE 4 % EX LIQD: 60.0000 mL | Freq: Once | CUTANEOUS | Status: DC

## 2015-11-08 MED ORDER — TRANEXAMIC ACID 1000 MG/10ML IV SOLN
1000.0000 mg | INTRAVENOUS | Status: AC
  Administered 2015-11-08: 1000 mg via INTRAVENOUS
  Filled 2015-11-08: qty 10

## 2015-11-08 MED ORDER — METOCLOPRAMIDE HCL 5 MG/ML IJ SOLN
5.0000 mg | Freq: Three times a day (TID) | INTRAMUSCULAR | Status: DC | PRN
  Administered 2015-11-09: 10 mg via INTRAVENOUS
  Filled 2015-11-08: qty 2

## 2015-11-08 MED ORDER — MIDAZOLAM HCL 2 MG/2ML IJ SOLN
INTRAMUSCULAR | Status: AC
  Filled 2015-11-08: qty 2

## 2015-11-08 MED ORDER — OXYCODONE HCL 5 MG/5ML PO SOLN: 5.0000 mg | Freq: Once | ORAL | Status: AC | PRN

## 2015-11-08 MED ORDER — NITROGLYCERIN 0.4 MG SL SUBL: 0.4000 mg | SUBLINGUAL_TABLET | SUBLINGUAL | Status: DC | PRN

## 2015-11-08 MED ORDER — ACETAMINOPHEN 650 MG RE SUPP: 650.0000 mg | Freq: Four times a day (QID) | RECTAL | Status: DC | PRN

## 2015-11-08 MED ORDER — OXYCODONE HCL 5 MG PO TABS
ORAL_TABLET | ORAL | Status: AC
  Filled 2015-11-08: qty 1

## 2015-11-08 MED ORDER — DEXTROSE-NACL 5-0.45 % IV SOLN: 100.0000 mL/h | INTRAVENOUS | Status: DC

## 2015-11-08 MED ORDER — CARVEDILOL 3.125 MG PO TABS
3.1250 mg | ORAL_TABLET | Freq: Two times a day (BID) | ORAL | Status: DC
  Administered 2015-11-09 – 2015-11-10 (×3): 3.125 mg via ORAL
  Filled 2015-11-08 (×3): qty 1

## 2015-11-08 MED ORDER — GLYCOPYRROLATE 0.2 MG/ML IJ SOLN
INTRAMUSCULAR | Status: DC | PRN
  Administered 2015-11-08: 0.2 mg via INTRAVENOUS

## 2015-11-08 MED ORDER — PHENYLEPHRINE HCL 10 MG/ML IJ SOLN
10.0000 mg | INTRAMUSCULAR | Status: DC | PRN
  Administered 2015-11-08: 15 ug/min via INTRAVENOUS

## 2015-11-08 MED ORDER — CEFAZOLIN SODIUM-DEXTROSE 2-4 GM/100ML-% IV SOLN
2.0000 g | Freq: Four times a day (QID) | INTRAVENOUS | Status: AC
  Administered 2015-11-08 (×2): 2 g via INTRAVENOUS
  Filled 2015-11-08 (×3): qty 100

## 2015-11-08 MED ORDER — ONDANSETRON HCL 4 MG PO TABS: 4.0000 mg | ORAL_TABLET | Freq: Three times a day (TID) | ORAL | Status: DC | PRN

## 2015-11-08 MED ORDER — OXYCODONE HCL 5 MG PO TABS
5.0000 mg | ORAL_TABLET | Freq: Once | ORAL | Status: AC | PRN
  Administered 2015-11-08: 5 mg via ORAL

## 2015-11-08 MED ORDER — SODIUM CHLORIDE 0.9 % IJ SOLN
INTRAMUSCULAR | Status: DC | PRN
  Administered 2015-11-08: 30 mL

## 2015-11-08 MED ORDER — SODIUM CHLORIDE 0.9 % IR SOLN
Status: DC | PRN
  Administered 2015-11-08: 3000 mL

## 2015-11-08 MED ORDER — RIVAROXABAN 10 MG PO TABS
10.0000 mg | ORAL_TABLET | Freq: Every day | ORAL | Status: DC
  Administered 2015-11-09 – 2015-11-10 (×2): 10 mg via ORAL
  Filled 2015-11-08 (×2): qty 1

## 2015-11-08 MED ORDER — FENTANYL CITRATE (PF) 100 MCG/2ML IJ SOLN
25.0000 ug | INTRAMUSCULAR | Status: DC | PRN
  Administered 2015-11-08: 50 ug via INTRAVENOUS
  Administered 2015-11-08 (×2): 25 ug via INTRAVENOUS

## 2015-11-08 MED ORDER — DEXTROSE-NACL 5-0.45 % IV SOLN
INTRAVENOUS | Status: DC
  Administered 2015-11-08: 17:00:00 via INTRAVENOUS

## 2015-11-08 MED ORDER — DEXAMETHASONE SODIUM PHOSPHATE 10 MG/ML IJ SOLN
10.0000 mg | Freq: Once | INTRAMUSCULAR | Status: AC
  Administered 2015-11-09: 10 mg via INTRAVENOUS
  Filled 2015-11-08: qty 1

## 2015-11-08 MED ORDER — PHENOL 1.4 % MT LIQD: 1.0000 | OROMUCOSAL | Status: DC | PRN

## 2015-11-08 MED ORDER — METOCLOPRAMIDE HCL 5 MG PO TABS: 5.0000 mg | ORAL_TABLET | Freq: Three times a day (TID) | ORAL | Status: DC | PRN

## 2015-11-08 MED ORDER — ATORVASTATIN CALCIUM 20 MG PO TABS
20.0000 mg | ORAL_TABLET | Freq: Every day | ORAL | Status: DC
  Administered 2015-11-08 – 2015-11-10 (×3): 20 mg via ORAL
  Filled 2015-11-08 (×4): qty 1

## 2015-11-08 MED ORDER — DOCUSATE SODIUM 100 MG PO CAPS
100.0000 mg | ORAL_CAPSULE | Freq: Two times a day (BID) | ORAL | Status: DC
  Administered 2015-11-08 – 2015-11-10 (×4): 100 mg via ORAL
  Filled 2015-11-08 (×4): qty 1

## 2015-11-08 MED ORDER — EPHEDRINE SULFATE 50 MG/ML IJ SOLN
INTRAMUSCULAR | Status: DC | PRN
  Administered 2015-11-08: 10 mg via INTRAVENOUS
  Administered 2015-11-08: 15 mg via INTRAVENOUS
  Administered 2015-11-08 (×2): 5 mg via INTRAVENOUS

## 2015-11-08 MED ORDER — PROPOFOL 10 MG/ML IV BOLUS
INTRAVENOUS | Status: AC
  Filled 2015-11-08: qty 20

## 2015-11-08 SURGICAL SUPPLY — 69 items
BANDAGE ESMARK 6X9 LF (GAUZE/BANDAGES/DRESSINGS) ×1 IMPLANT
BLADE SAW RECIP 87.9 MT (BLADE) IMPLANT
BLADE SAW SGTL 81X20 HD (BLADE) IMPLANT
BNDG CMPR 9X6 STRL LF SNTH (GAUZE/BANDAGES/DRESSINGS) ×1
BNDG ESMARK 6X9 LF (GAUZE/BANDAGES/DRESSINGS) ×3
BONE CEMENT PALACOSE (Orthopedic Implant) ×3 IMPLANT
BOWL SMART MIX CTS (DISPOSABLE) ×3 IMPLANT
CAPT KNEE PARTIAL 2 ×2 IMPLANT
CEMENT BONE PALACOSE (Orthopedic Implant) ×1 IMPLANT
CHLORAPREP W/TINT 26ML (MISCELLANEOUS) ×3 IMPLANT
CLOSURE STERI-STRIP 1/2X4 (GAUZE/BANDAGES/DRESSINGS) ×1
CLSR STERI-STRIP ANTIMIC 1/2X4 (GAUZE/BANDAGES/DRESSINGS) ×2 IMPLANT
COVER SURGICAL LIGHT HANDLE (MISCELLANEOUS) ×3 IMPLANT
CUFF TOURNIQUET SINGLE 34IN LL (TOURNIQUET CUFF) ×3 IMPLANT
DECANTER SPIKE VIAL GLASS SM (MISCELLANEOUS) ×2 IMPLANT
DRAPE ARTHROSCOPY W/POUCH 114 (DRAPES) ×5 IMPLANT
DRAPE IMP U-DRAPE 54X76 (DRAPES) ×6 IMPLANT
DRAPE PROXIMA HALF (DRAPES) ×3 IMPLANT
DRAPE U-SHAPE 47X51 STRL (DRAPES) ×3 IMPLANT
DRSG MEPILEX BORDER 4X12 (GAUZE/BANDAGES/DRESSINGS) ×2 IMPLANT
DRSG MEPILEX BORDER 4X4 (GAUZE/BANDAGES/DRESSINGS) IMPLANT
DRSG MEPILEX BORDER 4X8 (GAUZE/BANDAGES/DRESSINGS) ×1 IMPLANT
ELECT CAUTERY BLADE 6.4 (BLADE) ×3 IMPLANT
ELECT REM PT RETURN 9FT ADLT (ELECTROSURGICAL) ×3
ELECTRODE REM PT RTRN 9FT ADLT (ELECTROSURGICAL) ×1 IMPLANT
EVACUATOR 1/8 PVC DRAIN (DRAIN) IMPLANT
FACESHIELD WRAPAROUND (MASK) ×6 IMPLANT
FACESHIELD WRAPAROUND OR TEAM (MASK) ×2 IMPLANT
GLOVE BIO SURGEON STRL SZ7 (GLOVE) ×3 IMPLANT
GLOVE BIO SURGEON STRL SZ7.5 (GLOVE) ×3 IMPLANT
GLOVE BIOGEL PI IND STRL 6.5 (GLOVE) IMPLANT
GLOVE BIOGEL PI IND STRL 7.0 (GLOVE) ×1 IMPLANT
GLOVE BIOGEL PI IND STRL 7.5 (GLOVE) IMPLANT
GLOVE BIOGEL PI IND STRL 8 (GLOVE) ×1 IMPLANT
GLOVE BIOGEL PI INDICATOR 6.5 (GLOVE) ×4
GLOVE BIOGEL PI INDICATOR 7.0 (GLOVE) ×2
GLOVE BIOGEL PI INDICATOR 7.5 (GLOVE) ×2
GLOVE BIOGEL PI INDICATOR 8 (GLOVE) ×2
GOWN STRL REUS W/ TWL LRG LVL3 (GOWN DISPOSABLE) ×1 IMPLANT
GOWN STRL REUS W/ TWL XL LVL3 (GOWN DISPOSABLE) ×1 IMPLANT
GOWN STRL REUS W/TWL LRG LVL3 (GOWN DISPOSABLE) ×9
GOWN STRL REUS W/TWL XL LVL3 (GOWN DISPOSABLE) ×3
HANDPIECE INTERPULSE COAX TIP (DISPOSABLE) ×3
IMMOBILIZER KNEE 22 UNIV (SOFTGOODS) ×3 IMPLANT
KIT BASIN OR (CUSTOM PROCEDURE TRAY) ×3 IMPLANT
KIT ROOM TURNOVER OR (KITS) ×3 IMPLANT
MANIFOLD NEPTUNE II (INSTRUMENTS) ×3 IMPLANT
NDL 18GX1X1/2 (RX/OR ONLY) (NEEDLE) IMPLANT
NEEDLE 18GX1X1/2 (RX/OR ONLY) (NEEDLE) ×3 IMPLANT
NS IRRIG 1000ML POUR BTL (IV SOLUTION) ×3 IMPLANT
PACK BLADE SAW RECIP 70 3 PT (BLADE) ×2 IMPLANT
PACK TOTAL JOINT (CUSTOM PROCEDURE TRAY) ×3 IMPLANT
PACK UNIVERSAL I (CUSTOM PROCEDURE TRAY) ×3 IMPLANT
PAD ARMBOARD 7.5X6 YLW CONV (MISCELLANEOUS) ×3 IMPLANT
SET HNDPC FAN SPRY TIP SCT (DISPOSABLE) ×1 IMPLANT
STAPLER VISISTAT 35W (STAPLE) IMPLANT
SUCTION FRAZIER HANDLE 10FR (MISCELLANEOUS) ×2
SUCTION TUBE FRAZIER 10FR DISP (MISCELLANEOUS) ×1 IMPLANT
SUT MNCRL AB 4-0 PS2 18 (SUTURE) IMPLANT
SUT MON AB 2-0 CT1 27 (SUTURE) ×6 IMPLANT
SUT MON AB 2-0 CT1 36 (SUTURE) ×4 IMPLANT
SUT VIC AB 1 CTX 36 (SUTURE) ×6
SUT VIC AB 1 CTX36XBRD ANBCTR (SUTURE) ×2 IMPLANT
SYR 3ML LL SCALE MARK (SYRINGE) ×2 IMPLANT
SYRINGE 60CC LL (MISCELLANEOUS) ×2 IMPLANT
SYRINGE TOOMEY DISP (SYRINGE) ×2 IMPLANT
TOWEL OR 17X24 6PK STRL BLUE (TOWEL DISPOSABLE) ×3 IMPLANT
TOWEL OR 17X26 10 PK STRL BLUE (TOWEL DISPOSABLE) ×3 IMPLANT
TRAY CATH 16FR W/PLASTIC CATH (SET/KITS/TRAYS/PACK) ×2 IMPLANT

## 2015-11-08 NOTE — Progress Notes (Signed)
Orthopedic Tech Progress Note Patient Details:  [name] 01-19-1941 [id number] Applied CPM to RLE.  Left Bone Foam with pt.'s nurse. CPM Right Knee CPM Right Knee: On Right Knee Flexion (Degrees): 90 Right Knee Extension (Degrees): 0   Darrol Poke 11/08/2015, 2:13 PM

## 2015-11-08 NOTE — Anesthesia Procedure Notes (Signed)
Spinal Patient location during procedure: OR Staffing Anesthesiologist: Barack Nicodemus Preanesthetic Checklist Completed: patient identified, surgical consent, pre-op evaluation, timeout performed, IV checked, risks and benefits discussed and monitors and equipment checked Spinal Block Patient position: sitting Prep: site prepped and draped and DuraPrep Patient monitoring: heart rate, cardiac monitor, continuous pulse ox and blood pressure Approach: midline Location: L4-5 Injection technique: single-shot Needle Needle type: Pencan  Needle gauge: 24 G Needle length: 10 cm Assessment Sensory level: T6

## 2015-11-08 NOTE — Transfer of Care (Signed)
Immediate Anesthesia Transfer of Care Note  Patient: [name]  Procedure(s) Performed: Procedure(s): RIGHT UNICOMPARTMENTAL KNEE (Right)  Patient Location: PACU  Anesthesia Type:Spinal  Level of Consciousness:  sedated, patient cooperative and responds to stimulation  Airway & Oxygen Therapy:Patient Spontanous Breathing and Patient connected to face mask oxgen  Post-op Assessment:  Report given to PACU RN and Post -op Vital signs reviewed and stable  Post vital signs:  Reviewed and stable  Last Vitals:  Filed Vitals:   11/08/15 0959  BP: 160/64  Pulse: 57  Temp: 36.7 C  Resp: 18    Complications: No apparent anesthesia complications

## 2015-11-08 NOTE — Op Note (Signed)
11/08/2015  12:47 PM  PATIENT:  [name]    PRE-OPERATIVE DIAGNOSIS:  OA RIGHT KNEE  POST-OPERATIVE DIAGNOSIS:  Same  PROCEDURE:  RIGHT UNICOMPARTMENTAL KNEE  SURGEON:  Rohen Kimes, Ernesta Amble, MD  PHYSICIAN ASSISTANT: Roxan Hockey, PA-C, She was present and scrubbed throughout the case, critical for completion in a timely fashion, and for retraction, instrumentation, and closure.   ANESTHESIA:   General  PREOPERATIVE INDICATIONS:  DAMONTEZ Graham is a  75 y.o. male with a diagnosis of OA RIGHT KNEE who failed conservative measures and elected for surgical management.    The risks benefits and alternatives were discussed with the patient preoperatively including but not limited to the risks of infection, bleeding, nerve injury, cardiopulmonary complications, blood clots, the need for revision surgery, among others, and the patient was willing to proceed.  OPERATIVE IMPLANTS: Biomet Oxford mobile bearing medial compartment arthroplasty. Femoral Component: medium. Tibial tray: C, Size 3 poly.   OPERATIVE FINDINGS: Endstage grade 4 medial compartment osteoarthritis. No significant changes in the lateral or patellofemoral joint  OPERATIVE PROCEDURE: The patient was brought to the operating room placed in supine position. General anesthesia was administered. IV antibiotics were given. The lower extremity was placed in the legholder and prepped and draped in usual sterile fashion.  Time out was performed.  The leg was elevated and exsanguinated and the tourniquet was inflated. Anteromedial incision was performed, and I took care to preserve the MCL. Parapatellar incision was carried out, and the osteophytes were excised, along with the medial meniscus and a small portion of the fat pad.  The extra medullary tibial cutting jig was applied, using the spoon and the 81mm G-Clamp, and I took care to protect the anterior cruciate ligament insertion and the tibial spine. The medial collateral ligament  was also protected, and I resected my proximal tibia, matching the anatomic slope.   The proximal tibial bony cut was removed in one piece, and I turned my attention to the femur.  The intramedullary femoral rod was placed using the drill, and then using the appropriate reference, I assembled the femoral jig, setting my posterior cutting block. I resected my posterior femur, and then measured my gap.   I then used the mill to match the extension gap to the flexion gap. The gaps were then measured again with the appropriate feeler gauges. Once I had balanced flexion and extension gaps, I then completed the preparation of the femur.  I milled off the anterior aspect of the distal femur to prevent impingement. I also exposed the tibia, and selected the above-named component, and then used the cutting jig to prepare the keel slot on the tibia. I also used the awl to curette out the bone to complete the preparation of the keel. The back wall was intact.  I then placed trial components, and it was found to have excellent motion, and appropriate balance.  I then cemented the components into place, cementing the tibia first, removing all excess cement, and then cementing the femur.  All loose cement was removed.  The real polyethylene insert was applied manually, and the knee was taken through functional range of motion, and found to have excellent stability and restoration of joint motion, with excellent balance.  The wounds were irrigated copiously, and the parapatellar tissue closed with Vicryl, followed by Vicryl for the subcutaneous tissue, with routine closure with Steri-Strips and sterile gauze.  The tourniquet was released, and the patient was awakened and extubated and returned to PACU  in stable and satisfactory condition. There were no complications.  POSTOPERATIVE PLAN: DVT px will consist of SCD's and chemical px, WBAT     Renette Butters, MD  This note was generated using a template  and dragon dictation system. In light of that, I have reviewed the note and all aspects of it are applicable to this case. Any dictation errors are due to the computerized dictation system.

## 2015-11-08 NOTE — Anesthesia Postprocedure Evaluation (Signed)
Anesthesia Post Note  Patient: [name]  Procedure(s) Performed: Procedure(s) (LRB): RIGHT UNICOMPARTMENTAL KNEE (Right)  Patient location during evaluation: PACU Anesthesia Type: Spinal Level of consciousness: awake and alert Pain management: pain level controlled Vital Signs Assessment: post-procedure vital signs reviewed and stable Respiratory status: spontaneous breathing Cardiovascular status: stable Postop Assessment: spinal receding, patient able to bend at knees and no signs of nausea or vomiting Anesthetic complications: no    Last Vitals:  Filed Vitals:   11/08/15 1615 11/08/15 1642  BP: 126/66 127/67  Pulse: 63 62  Temp: 36.6 C 36.5 C  Resp: 16 18    Last Pain:  Filed Vitals:   11/08/15 1700  PainSc: 2                  Solace Manwarren

## 2015-11-08 NOTE — Interval H&P Note (Signed)
History and Physical Interval Note:  11/08/2015 9:11 AM  [name]  has presented today for surgery, with the diagnosis of OA RIGHT KNEE  The various methods of treatment have been discussed with the patient and family. After consideration of risks, benefits and other options for treatment, the patient has consented to  Procedure(s): UNICOMPARTMENTAL KNEE (Right) as a surgical intervention .  The patient's history has been reviewed, patient examined, no change in status, stable for surgery.  I have reviewed the patient's chart and labs.  Questions were answered to the patient's satisfaction.     [name]

## 2015-11-08 NOTE — Discharge Instructions (Signed)

## 2015-11-08 NOTE — Progress Notes (Signed)
Orthopedic Tech Progress Note Patient Details:  [name] Mar 04, 1941 [id number]  CPM Right Knee CPM Right Knee: On Right Knee Flexion (Degrees): 90 Right Knee Extension (Degrees): 0   [name] 11/08/2015, 7:10 PM

## 2015-11-08 NOTE — Progress Notes (Signed)
Orthopedic Tech Progress Note Patient Details:  [name] 1940/09/12 [id number]  CPM Right Knee CPM Right Knee: On Right Knee Flexion (Degrees): 90 Right Knee Extension (Degrees): 0   [name] 11/08/2015, 5:05 PM

## 2015-11-09 ENCOUNTER — Encounter (HOSPITAL_COMMUNITY): Payer: Self-pay | Admitting: Orthopedic Surgery

## 2015-11-09 LAB — CBC
HEMATOCRIT: 38.9 % — AB (ref 39.0–52.0)
Hemoglobin: 12.9 g/dL — ABNORMAL LOW (ref 13.0–17.0)
MCH: 33.3 pg (ref 26.0–34.0)
MCHC: 33.2 g/dL (ref 30.0–36.0)
MCV: 100.5 fL — AB (ref 78.0–100.0)
Platelets: 77 10*3/uL — ABNORMAL LOW (ref 150–400)
RBC: 3.87 MIL/uL — ABNORMAL LOW (ref 4.22–5.81)
RDW: 12.3 % (ref 11.5–15.5)
WBC: 6.8 10*3/uL (ref 4.0–10.5)

## 2015-11-09 LAB — BASIC METABOLIC PANEL
Anion gap: 8 (ref 5–15)
BUN: 11 mg/dL (ref 6–20)
CALCIUM: 8.3 mg/dL — AB (ref 8.9–10.3)
CO2: 25 mmol/L (ref 22–32)
CREATININE: 0.91 mg/dL (ref 0.61–1.24)
Chloride: 101 mmol/L (ref 101–111)
GFR calc Af Amer: >60 mL/min (ref >60)
GFR calc non Af Amer: >60 mL/min (ref >60)
Glucose, Bld: 119 mg/dL — ABNORMAL HIGH (ref 65–99)
Potassium: 4.1 mmol/L (ref 3.5–5.1)
Sodium: 134 mmol/L — ABNORMAL LOW (ref 135–145)

## 2015-11-09 MED ORDER — DEXTROSE-NACL 5-0.45 % IV SOLN
INTRAVENOUS | Status: DC
  Administered 2015-11-09 – 2015-11-10 (×2): via INTRAVENOUS

## 2015-11-09 NOTE — Progress Notes (Signed)
Orthopedic Tech Progress Note Patient Details:  [name] 04/25/1941 [id number] Ortho visit put on cpm at 1905 Patient ID: [name], male   DOB: 1940-10-20, 75 y.o.   MRN: [id number]   Braulio Bosch 11/09/2015, 7:04 PM

## 2015-11-09 NOTE — Progress Notes (Signed)
Orthopedic Tech Progress Note Patient Details:  [name] 01-12-1941 [id number]  Patient ID: [name], male   DOB: 29-Dec-1940, 75 y.o.   MRN: [id number] Applied cpm 0-60  Karolee Stamps 11/09/2015, 6:26 AM

## 2015-11-09 NOTE — Progress Notes (Signed)
   Assessment/Plan: 1 Day Post-Op  S/P Procedure(s) (LRB): RIGHT UNICOMPARTMENTAL KNEE (Right) by Dr. Ernesta Amble. [name] on 11/08/15  Principal Problem:   Primary osteoarthritis of right knee Active Problems:   Hyperlipidemia   Thrombocytopenia (HCC)   Obstructive sleep apnea   Essential hypertension   BEN HTN HEART DISEASE WITHOUT HEART FAIL   Coronary atherosclerosis   CORONARY ATHEROSCLEROSIS NATIVE CORONARY ARTERY   Cardiomyopathy, ischemic   BPH (benign prostatic hypertrophy)   NEPHROLITHIASIS, HX OF   ITP (idiopathic thrombocytopenic purpura)   Mycosis fungoides, stage 1 (HCC)   Bradycardia   Osteoarthritis of right knee ADDITIONAL DIAGNOSIS: Acute Blood loss anemia - likely with dilutional component.   Platelets 77 from 102 preop  PLAN: Advance diet Up with therapy D/C IV fluids Discharge home with home health pending PT eval and patient progress.  Weight Bearing: Weight Bearing as Tolerated (WBAT)  Dressings: PRN VTE prophylaxis: Xarelto Dispo: Home with home health PT  Subjective: Patient reports pain as moderate, controlled with  PO meds.  Tolerating diet.  Urinating.  +Flatus.  No CP, SOB.  Objective:   VITALS:   Filed Vitals:   11/08/15 1642 11/08/15 2005 11/09/15 0033 11/09/15 0433  BP: 127/67 123/65 109/62 126/58  Pulse: 62 67 58 59  Temp: 97.7 F (36.5 C) 98 F (36.7 C) 98.6 F (37 C) 98.8 F (37.1 C)  TempSrc:  Oral Oral Oral  Resp: 18 16 15 16   Height:      Weight:      SpO2: 100% 100% 100% 100%    Lab Results  Component Value Date   WBC 6.8 11/09/2015   HGB 12.9* 11/09/2015   HCT 38.9* 11/09/2015   MCV 100.5* 11/09/2015   PLT 77* 11/09/2015   BMET    Component Value Date/Time   NA 134* 11/09/2015 0701   NA 142 03/23/2013 1424   NA 140 01/10/2011 1442   K 4.1 11/09/2015 0701   K 4.7 03/23/2013 1424   K 4.6 01/10/2011 1442   CL 101 11/09/2015 0701   CL 106 09/22/2012 1428   CL 102 01/10/2011 1442   CO2 25 11/09/2015 0701   CO2 28 03/23/2013 1424   CO2 26 01/10/2011 1442   GLUCOSE 119* 11/09/2015 0701   GLUCOSE 91 03/23/2013 1424   GLUCOSE 99 09/22/2012 1428   GLUCOSE 128* 01/10/2011 1442   BUN 11 11/09/2015 0701   BUN 18.1 03/23/2013 1424   BUN 19 01/10/2011 1442   CREATININE 0.91 11/09/2015 0701   CREATININE 0.85 10/25/2014 1040   CREATININE 0.8 03/23/2013 1424   CALCIUM 8.3* 11/09/2015 0701   CALCIUM 9.4 03/23/2013 1424   CALCIUM 8.7 01/10/2011 1442   GFRNONAA >60 11/09/2015 0701   GFRNONAA 81 03/08/2014 0927   GFRAA >60 11/09/2015 0701   GFRAA >89 03/08/2014 0927    Physical Exam General: NAD.  Supine in bed. Resp: clear to auscultation bilaterally Cardio: regular rate and rhythm ABD soft Neurologically intact MSK Neurovascular intact Sensation intact distally Intact pulses distally Dorsiflexion/Plantar flexion intact Incision: dressing C/D/I   [name] 11/09/2015, 7:39 AM

## 2015-11-09 NOTE — Progress Notes (Signed)
Physical Therapy Treatment Patient Details Name: [name] MRN: [id number] DOB: Nov 01, 1940 Today's Date: 11/09/2015    History of Present Illness Pt is a 75 y.o. male now s/p Rt unicompartmental knee. PMH: CAD, anxiety, depression, chronic back pain, MI.     PT Comments    Patient progressing well with mobility including ambulation (175 ft) but limited due to reports of ongoing nausea and general fatigue. PT to continue to follow and progress mobility with anticipation of D/C to home following acute stay.   Follow Up Recommendations  Home health PT;Supervision for mobility/OOB     Equipment Recommendations  None recommended by PT    Recommendations for Other Services       Precautions / Restrictions Precautions Precautions: Knee;Fall Precaution Booklet Issued: Yes (comment) Precaution Comments: HEP provided, reviewed knee extension precautions Restrictions Weight Bearing Restrictions: Yes RLE Weight Bearing: Weight bearing as tolerated    Mobility  Bed Mobility Overal bed mobility: Needs Assistance Bed Mobility: Supine to Sit     Supine to sit: Supervision     General bed mobility comments: HOB 30 degrees  Transfers Overall transfer level: Needs assistance Equipment used: Rolling walker (2 wheeled) Transfers: Sit to/from Stand Sit to Stand: Min guard         General transfer comment: cues for hand position  Ambulation/Gait Ambulation/Gait assistance: Supervision;Min guard Ambulation Distance (Feet): 175 Feet Assistive device: Rolling walker (2 wheeled) Gait Pattern/deviations: Step-through pattern;Decreased stance time - right;Decreased weight shift to right Gait velocity: decreased   General Gait Details: improving strides, no loss of balance. Pt reporting continuing nausea and fatigue while ambulating.    Stairs            Wheelchair Mobility    Modified Rankin (Stroke Patients Only)       Balance Overall balance assessment: Needs  assistance Sitting-balance support: No upper extremity supported Sitting balance-Leahy Scale: Good     Standing balance support: Bilateral upper extremity supported Standing balance-Leahy Scale: Poor Standing balance comment: using rw for support                    Cognition Arousal/Alertness: Awake/alert Behavior During Therapy: WFL for tasks assessed/performed Overall Cognitive Status: Within Functional Limits for tasks assessed                      Exercises Total Joint Exercises Ankle Circles/Pumps: AROM;Both;10 reps Quad Sets: Strengthening;Right;10 reps Heel Slides: AAROM;Right;10 reps Straight Leg Raises: Strengthening;Right;10 reps Goniometric ROM: 58 degrees    General Comments        Pertinent Vitals/Pain Pain Assessment: 0-10 Pain Score: 3  Pain Location: Rt knee Pain Descriptors / Indicators: Sore Pain Intervention(s): Limited activity within patient's tolerance;Monitored during session    Home Living    Prior Function         PT Goals (current goals can now be found in the care plan section) Acute Rehab PT Goals Patient Stated Goal: get feeling better PT Goal Formulation: With patient Time For Goal Achievement: 11/23/15 Potential to Achieve Goals: Good Progress towards PT goals: Progressing toward goals    Frequency  7X/week    PT Plan Current plan remains appropriate    Co-evaluation             End of Session Equipment Utilized During Treatment: Gait belt Activity Tolerance: Patient limited by fatigue (nausea) Patient left: in bed;in CPM;with call bell/phone within reach;with family/visitor present;with SCD's reapplied     Time: [id number]  PT Time Calculation (min) (ACUTE ONLY): 24 min  Charges:  $Gait Training: 8-22 mins $Therapeutic Exercise: 8-22 mins                    G Codes:      Cassell Clement, PT, CSCS Pager [phone] Office [phone]  11/09/2015, 2:40 PM

## 2015-11-09 NOTE — Evaluation (Signed)
Physical Therapy Evaluation Patient Details Name: [name] MRN: [id number] DOB: [birth year] Today's Date: 11/09/2015   History of Present Illness  Pt is a 75 y.o. male now s/p Rt unicompartmental knee. PMH: CAD, anxiety, depression, chronic back pain, MI.   Clinical Impression  Pt is s/p TKA resulting in the deficits listed below (see PT Problem List). Pt able to ambulate 110 ft with rw, minguard/supervision. Pt does report ongoing nausea throughout session despite being medicated prior to session. At home the patient's spouse will not be able to provide any physical assistance, pt will need to be at supervision to mod I level. Pt will benefit from skilled PT to increase their independence and safety with mobility to allow discharge to the venue listed below.      Follow Up Recommendations Home health PT;Supervision for mobility/OOB    Equipment Recommendations  None recommended by PT    Recommendations for Other Services       Precautions / Restrictions Precautions Precautions: Knee;Fall Precaution Booklet Issued: Yes (comment) Precaution Comments: HEP provided, reviewed knee extension precautions Restrictions Weight Bearing Restrictions: Yes RLE Weight Bearing: Weight bearing as tolerated      Mobility  Bed Mobility Overal bed mobility: Needs Assistance Bed Mobility: Supine to Sit     Supine to sit: Supervision     General bed mobility comments: HOB 30 degrees  Transfers Overall transfer level: Needs assistance Equipment used: Rolling walker (2 wheeled) Transfers: Sit to/from Stand Sit to Stand: Min guard         General transfer comment: cue for hand position, encouraging use of Rt LE  Ambulation/Gait Ambulation/Gait assistance: Min guard;Supervision Ambulation Distance (Feet): 110 Feet Assistive device: Rolling walker (2 wheeled) Gait Pattern/deviations: Step-through pattern;Decreased weight shift to right;Decreased stance time - right Gait velocity:  decreased   General Gait Details: working on even strides and weightbearing through [place]            Wheelchair Mobility    Modified Rankin (Stroke Patients Only)       Balance Overall balance assessment: Needs assistance Sitting-balance support: No upper extremity supported Sitting balance-Leahy Scale: Good     Standing balance support: Bilateral upper extremity supported Standing balance-Leahy Scale: Poor Standing balance comment: using rw for support                             Pertinent Vitals/Pain Pain Assessment: 0-10 Pain Score: 4  Pain Location: Rt knee Pain Descriptors / Indicators: Sore Pain Intervention(s): Limited activity within patient's tolerance;Monitored during session    Home Living Family/patient expects to be discharged to:: Private residence Living Arrangements: Spouse/significant other Available Help at Discharge: Family;Available 24 hours/day Type of Home: House Home Access: Stairs to enter Entrance Stairs-Rails: None Entrance Stairs-Number of Steps: 2 Home Layout: Two level;Able to live on main level with bedroom/bathroom Home Equipment: Gilford Rile - 2 wheels;Bedside commode;Cane - single point Additional Comments: Spouse will not be able to provide any physical assistance at home. Daughter in town until Friday.     Prior Function Level of Independence: Independent with assistive device(s)         Comments: using SPC      Hand Dominance        Extremity/Trunk Assessment   Upper Extremity Assessment: Overall WFL for tasks assessed           Lower Extremity Assessment: RLE deficits/detail RLE Deficits / Details: able to perform SLR  independently       Communication   Communication: No difficulties  Cognition Arousal/Alertness: Awake/alert Behavior During Therapy: WFL for tasks assessed/performed Overall Cognitive Status: Within Functional Limits for tasks assessed                       General Comments      Exercises        Assessment/Plan    PT Assessment Patient needs continued PT services  PT Diagnosis Difficulty walking   PT Problem List Decreased strength;Decreased range of motion;Decreased activity tolerance;Decreased balance;Decreased mobility  PT Treatment Interventions Gait training;DME instruction;Stair training;Functional mobility training;Therapeutic activities;Therapeutic exercise;Balance training;Patient/family education   PT Goals (Current goals can be found in the Care Plan section) Acute Rehab PT Goals Patient Stated Goal: be active and independent PT Goal Formulation: With patient Time For Goal Achievement: 11/23/15 Potential to Achieve Goals: Good    Frequency 7X/week   Barriers to discharge        Co-evaluation               End of Session Equipment Utilized During Treatment: Gait belt Activity Tolerance: Patient tolerated treatment well;Other (comment) (complains of ongoing nausea, no emesis) Patient left: in chair;with call bell/phone within reach (in bone foam) Nurse Communication: Mobility status;Weight bearing status         Time: 1043-1110 PT Time Calculation (min) (ACUTE ONLY): 27 min   Charges:   PT Evaluation $PT Eval Moderate Complexity: 1 Procedure PT Treatments $Gait Training: 8-22 mins   PT G Codes:        Cassell Clement, PT, CSCS Pager [phone] Office [phone]  11/09/2015, 11:23 AM

## 2015-11-10 LAB — BASIC METABOLIC PANEL
Anion gap: 4 — ABNORMAL LOW (ref 5–15)
BUN: 13 mg/dL (ref 6–20)
CALCIUM: 8.3 mg/dL — AB (ref 8.9–10.3)
CHLORIDE: 102 mmol/L (ref 101–111)
CO2: 26 mmol/L (ref 22–32)
CREATININE: 0.83 mg/dL (ref 0.61–1.24)
Glucose, Bld: 164 mg/dL — ABNORMAL HIGH (ref 65–99)
Potassium: 4.6 mmol/L (ref 3.5–5.1)
SODIUM: 132 mmol/L — AB (ref 135–145)

## 2015-11-10 LAB — CBC
HCT: 36.7 % — ABNORMAL LOW (ref 39.0–52.0)
HEMOGLOBIN: 12.2 g/dL — AB (ref 13.0–17.0)
MCH: 33.1 pg (ref 26.0–34.0)
MCHC: 33.2 g/dL (ref 30.0–36.0)
MCV: 99.5 fL (ref 78.0–100.0)
Platelets: 76 10*3/uL — ABNORMAL LOW (ref 150–400)
RBC: 3.69 MIL/uL — ABNORMAL LOW (ref 4.22–5.81)
RDW: 11.9 % (ref 11.5–15.5)
WBC: 9.3 10*3/uL (ref 4.0–10.5)

## 2015-11-10 MED ORDER — HYDROCODONE-ACETAMINOPHEN 5-325 MG PO TABS: 1.0000 | ORAL_TABLET | ORAL | Status: DC | PRN

## 2015-11-10 NOTE — Evaluation (Signed)
Occupational Therapy Evaluation and Discharge Patient Details Name: [name] MRN: [id number] DOB: 04/03/1941 Today's Date: 11/10/2015    History of Present Illness Pt is a 75 y.o. male now s/p Rt unicompartmental knee. PMH: CAD, anxiety, depression, chronic back pain, MI.    Clinical Impression   Pt reports feeling much better today. Mild pain and no nausea. Moving about room and bathroom at a modified independent level using RW. Pt able to reach his R foot for bathing and dressing. Instructed in tub transfers and how to safely transport items with RW.  No further OT needs.     Follow Up Recommendations  No OT follow up    Equipment Recommendations  None recommended by OT    Recommendations for Other Services       Precautions / Restrictions Precautions Precautions: Knee;Fall Restrictions Weight Bearing Restrictions: Yes RLE Weight Bearing: Weight bearing as tolerated      Mobility Bed Mobility Overal bed mobility: Modified Independent Bed Mobility: Supine to Sit     Supine to sit: Modified independent (Device/Increase time)     General bed mobility comments: HOB 30 degrees  Transfers Overall transfer level: Modified independent Equipment used: Rolling walker (2 wheeled)                  Balance     Sitting balance-Leahy Scale: Good       Standing balance-Leahy Scale: Fair                              ADL Overall ADL's : Modified independent                                     Functional mobility during ADLs: Modified independent;Rolling walker General ADL Comments: Pt demonstrated ability to toilet, stand to groom and access R foot for LB bathing and dressing. Practiced simulated tub transfer and reinforced technique for safety. Educated in safe footwear and how to transport items safely with RW.     Vision     Perception     Praxis      Pertinent Vitals/Pain Pain Assessment: 0-10 Pain Score: 4  Pain  Location: R knee Pain Descriptors / Indicators: Sore Pain Intervention(s): Monitored during session;Premedicated before session     Hand Dominance Right   Extremity/Trunk Assessment Upper Extremity Assessment Upper Extremity Assessment: Overall WFL for tasks assessed   Lower Extremity Assessment Lower Extremity Assessment: Defer to PT evaluation       Communication Communication Communication: No difficulties   Cognition Arousal/Alertness: Awake/alert Behavior During Therapy: WFL for tasks assessed/performed Overall Cognitive Status: Within Functional Limits for tasks assessed                     General Comments       Exercises       Shoulder Instructions      Home Living Family/patient expects to be discharged to:: Private residence Living Arrangements: Spouse/significant other Available Help at Discharge: Family;Available 24 hours/day Type of Home: House Home Access: Stairs to enter [employer] of Steps: 2 Entrance Stairs-Rails: None Home Layout: Two level;Able to live on main level with bedroom/bathroom     Bathroom Shower/Tub: Tub/shower unit Shower/tub characteristics: Door Biochemist, clinical: Standard     Home Equipment: Environmental consultant - 2 wheels;Bedside commode;Cane - single point;Shower seat;Grab bars - tub/shower  Additional Comments: Spouse will not be able to provide any physical assistance at home. Daughter in town until Friday.       Prior Functioning/Environment Level of Independence: Independent with assistive device(s)        Comments: using SPC     OT Diagnosis: Generalized weakness;Acute pain   OT Problem List:     OT Treatment/Interventions:      OT Goals(Current goals can be found in the care plan section) Acute Rehab OT Goals Patient Stated Goal: return to active lifestyle  OT Frequency:     Barriers to D/C:            Co-evaluation              End of Session Equipment Utilized During Treatment:  Rolling walker;Gait belt   Activity Tolerance: Patient tolerated treatment well Patient left: in chair;with call bell/phone within reach  Bone Foam applied   Time: [id number] OT Time Calculation (min): 18 min Charges:  OT General Charges $OT Visit: 1 Procedure OT Evaluation $OT Eval Low Complexity: 1 Procedure G-Codes:    Malka So 11/10/2015, 9:00 AM  [phone]

## 2015-11-10 NOTE — Care Management Important Message (Signed)
Important Message  Patient Details  Name: [name] MRN: [id number] Date of Birth: 11-05-1940   Medicare Important Message Given:  Yes    Loann Quill 11/10/2015, 9:59 AM

## 2015-11-10 NOTE — Progress Notes (Signed)
   Assessment/Plan: 2 Days Post-Op  S/P Procedure(s) (LRB): RIGHT UNICOMPARTMENTAL KNEE (Right) by Dr. Ernesta Amble. Percell Miller on 11/08/15  Principal Problem:   Primary osteoarthritis of right knee Active Problems:   Hyperlipidemia   Thrombocytopenia (HCC)   Obstructive sleep apnea   Essential hypertension   BEN HTN HEART DISEASE WITHOUT HEART FAIL   Coronary atherosclerosis   CORONARY ATHEROSCLEROSIS NATIVE CORONARY ARTERY   Cardiomyopathy, ischemic   BPH (benign prostatic hypertrophy)   NEPHROLITHIASIS, HX OF   ITP (idiopathic thrombocytopenic purpura)   Mycosis fungoides, stage 1 (HCC)   Bradycardia   Osteoarthritis of right knee ADDITIONAL DIAGNOSIS: Acute Blood loss anemia. Platelets and H/H stable  - likely with dilutional component.     PLAN: D/C IV fluids Continue diet Up with therapy Discharge home with home health pending PT eval and patient progress.  Weight Bearing: Weight Bearing as Tolerated (WBAT)  Dressings: PRN VTE prophylaxis: Xarelto Dispo: Home with home health PT  Subjective: Patient reports pain as mild, controlled with PO meds.  Tolerating diet.  Urinating.  +Flatus.  No CP, SOB.  Feeling fit to go home today.  Objective:   VITALS:   Filed Vitals:   11/09/15 1500 11/09/15 2043 11/09/15 2122 11/10/15 0500  BP: 112/49 92/49 103/50 93/47  Pulse:  66 59 56  Temp: 98.1 F (36.7 C) 99 F (37.2 C)  98.2 F (36.8 C)  TempSrc: Oral Oral  Oral  Resp:  16  18  Height:      Weight:      SpO2:  96%  95%    Lab Results  Component Value Date   WBC 9.3 11/10/2015   HGB 12.2* 11/10/2015   HCT 36.7* 11/10/2015   MCV 99.5 11/10/2015   PLT 76* 11/10/2015   BMET    Component Value Date/Time   NA 134* 11/09/2015 0701   NA 142 03/23/2013 1424   NA 140 01/10/2011 1442   K 4.1 11/09/2015 0701   K 4.7 03/23/2013 1424   K 4.6 01/10/2011 1442   CL 101 11/09/2015 0701   CL 106 09/22/2012 1428   CL 102 01/10/2011 1442   CO2 25 11/09/2015 0701   CO2  28 03/23/2013 1424   CO2 26 01/10/2011 1442   GLUCOSE 119* 11/09/2015 0701   GLUCOSE 91 03/23/2013 1424   GLUCOSE 99 09/22/2012 1428   GLUCOSE 128* 01/10/2011 1442   BUN 11 11/09/2015 0701   BUN 18.1 03/23/2013 1424   BUN 19 01/10/2011 1442   CREATININE 0.91 11/09/2015 0701   CREATININE 0.85 10/25/2014 1040   CREATININE 0.8 03/23/2013 1424   CALCIUM 8.3* 11/09/2015 0701   CALCIUM 9.4 03/23/2013 1424   CALCIUM 8.7 01/10/2011 1442   GFRNONAA >60 11/09/2015 0701   GFRNONAA 81 03/08/2014 0927   GFRAA >60 11/09/2015 0701   GFRAA >89 03/08/2014 0927    Physical Exam General: NAD.  Supine in bed. Resp: clear to auscultation bilaterally Cardio: regular rate and rhythm ABD soft Neurologically intact MSK Neurovascular intact Sensation intact distally Intact pulses distally Dorsiflexion/Plantar flexion intact Incision: dressing C/D/I   Prudencio Burly III 11/10/2015, 6:51 AM

## 2015-11-10 NOTE — Progress Notes (Signed)
Pt discharge education and instructions completed with pt and family at bedside; all voices understanding and denies any questions. Pt IV and telemetry removed; pt handed his prescriptions for xarelto, zofran and Norco. Pt knee incision dsg remains clean, dry and intact with no active bleeding noted; Pt discharge home with family to transport him home. Pt transported off unit via wheelchair with family and belongings to the side. Delia Heady RN

## 2015-11-10 NOTE — Discharge Summary (Signed)
Discharge Summary  Patient ID: [name] MRN: [id number] DOB/AGE: 06/07/1940 75 y.o.  Admit date: 11/08/2015 Discharge date: 11/10/2015  Admission Diagnoses:  Primary osteoarthritis of right knee  Discharge Diagnoses:  Principal Problem:   Primary osteoarthritis of right knee Active Problems:   Hyperlipidemia   Thrombocytopenia (HCC)   Obstructive sleep apnea   Essential hypertension   BEN HTN HEART DISEASE WITHOUT HEART FAIL   Coronary atherosclerosis   CORONARY ATHEROSCLEROSIS NATIVE CORONARY ARTERY   Cardiomyopathy, ischemic   BPH (benign prostatic hypertrophy)   NEPHROLITHIASIS, HX OF   ITP (idiopathic thrombocytopenic purpura)   Mycosis fungoides, stage 1 (HCC)   Bradycardia   Osteoarthritis of right knee   Past Medical History  Diagnosis Date  . Allergy   . CAD (coronary artery disease)   . Hyperlipidemia   . Diverticulosis of colon   . Benign prostatic hypertrophy   . Anxiety   . History of colon polyps   . Depression   . Kidney stones     "I've had them twice; last time was ~ 9 yr ago; both times passed on their own" (10/27/2012)  . Pneumonia     "I think I had as a child" (October 27, 2012)  . ITP (idiopathic thrombocytopenic purpura)   . Factor VII deficiency ([place])     10/28/15: No history of factor VII deficiency per Dr. Beryle Beams  . History of blood transfusion 1941  . Arthritis     "all over" (Oct 27, 2012)  . Chronic lower back pain     "from a herniated disc" (27-Oct-2012)  . Myocardial infarction ([place]) 2009  . T-cell lymphoma ([place])     "I'm in stage I; original site was on my left chest; had a place around my waist treated last summer; got a place on back of my left leg" (10-27-12)  . Squamous carcinoma ([place])     "left side; top of head" (Oct 27, 2012)  . Basal cell carcinoma of head     "right side; top of head" (2012/10/27)  . Dysrhythmia     Surgeries: Procedure(s): RIGHT UNICOMPARTMENTAL KNEE on 11/08/2015   Consultants (if any):    Discharged  Condition: Improved  Hospital Course: SOCORRO GARBERS is an 75 y.o. male who was admitted 11/08/2015 with a diagnosis of Primary osteoarthritis of right knee and went to the operating room on 11/08/2015 and underwent the above named procedures.    He was given perioperative antibiotics:  Anti-infectives    Start     Dose/Rate Route Frequency Ordered Stop   11/08/15 1700  ceFAZolin (ANCEF) IVPB 2g/100 mL premix     2 g 200 mL/hr over 30 Minutes Intravenous Every 6 hours 11/08/15 1634 11/08/15 2300   11/08/15 1100  ceFAZolin (ANCEF) IVPB 2g/100 mL premix     2 g 200 mL/hr over 30 Minutes Intravenous To [facility] 11/07/15 1023 11/08/15 1114    .  He was given sequential compression devices, early ambulation, and Xarelto for DVT prophylaxis.  He benefited maximally from the hospital stay and there were no complications.    Recent vital signs:  Filed Vitals:   11/09/15 2122 11/10/15 0500  BP: 103/50 93/47  Pulse: 59 56  Temp:  98.2 F (36.8 C)  Resp:  18    Recent laboratory studies:  Lab Results  Component Value Date   HGB 12.2* 11/10/2015   HGB 12.9* 11/09/2015   HGB 15.1 10/28/2015   Lab Results  Component Value Date   WBC 9.3 11/10/2015  PLT 76* 11/10/2015   Lab Results  Component Value Date   INR 1.06 10/28/2015   Lab Results  Component Value Date   NA 134* 11/09/2015   K 4.1 11/09/2015   CL 101 11/09/2015   CO2 25 11/09/2015   BUN 11 11/09/2015   CREATININE 0.91 11/09/2015   GLUCOSE 119* 11/09/2015    Discharge Medications:     Medication List    TAKE these medications        acetaminophen 650 MG CR tablet  Commonly known as:  TYLENOL  Take 650 mg by mouth 2 (two) times daily.     ALPRAZolam 0.5 MG tablet  Commonly known as:  XANAX  Take 1 tablet (0.5 mg total) by mouth 2 (two) times daily as needed for anxiety (for sleep).     aspirin 81 MG tablet  Take 81 mg by mouth daily.     atorvastatin 20 MG tablet  Commonly known as:  LIPITOR   Take 1 tablet (20 mg total) by mouth daily.     carvedilol 3.125 MG tablet  Commonly known as:  COREG  Take 1 tablet (3.125 mg total) by mouth 2 (two) times daily with a meal.     cholecalciferol 1000 units tablet  Commonly known as:  VITAMIN D  Take 1,000 Units by mouth 2 (two) times daily.     Fish Oil 1000 MG Caps  Take 1,000 mg by mouth 2 (two) times daily.     HYDROcodone-acetaminophen 5-325 MG tablet  Commonly known as:  NORCO  Take 1-2 tablets by mouth every 4 (four) hours as needed for moderate pain.     losartan 50 MG tablet  Commonly known as:  COZAAR  Take 1 tablet (50 mg total) by mouth daily.     Magnesium 250 MG Tabs  Take 250 mg by mouth daily.     nitroGLYCERIN 0.4 MG SL tablet  Commonly known as:  NITROSTAT  Place 1 tablet (0.4 mg total) under the tongue every 5 (five) minutes as needed for chest pain (for chest pain).     ondansetron 4 MG tablet  Commonly known as:  ZOFRAN  Take 1 tablet (4 mg total) by mouth every 8 (eight) hours as needed for nausea or vomiting.     rivaroxaban 10 MG Tabs tablet  Commonly known as:  XARELTO  Take 1 tablet (10 mg total) by mouth daily.     Saw Palmetto 450 MG Caps  Take 450 mg by mouth 2 (two) times daily.     VITAMIN B 12 PO  Take 500 mg by mouth daily.        Diagnostic Studies: Dg Knee Right Port  11/08/2015  CLINICAL DATA:  Right knee osteoarthritis. Status post unicompartmental knee arthroplasty. EXAM: PORTABLE RIGHT KNEE - 1-2 VIEW COMPARISON:  None. FINDINGS: A medial unicompartmental arthroplasty is seen with both components in expected position. No evidence of fracture or dislocation. Postoperative knee joint effusion, soft tissue gas, and anterior soft tissue swelling noted. IMPRESSION: Expected postoperative appearance of medial unicompartmental arthroplasty. Electronically Signed   By: Earle Gell M.D.   On: 11/08/2015 15:35    Disposition: 01-Home or Self Care        Follow-up Information     Follow up with MURPHY, TIMOTHY D, MD In 2 weeks.   Specialty:  Orthopedic Surgery   Contact information:   [address] [id number] [phone]        Signed:  Prudencio Burly III PA-C 11/10/2015, 6:52 AM

## 2015-11-10 NOTE — Progress Notes (Signed)
Physical Therapy Treatment Patient Details Name: [name] MRN: [id number] DOB: 15-Oct-1940 Today's Date: 11/10/2015    History of Present Illness Pt is a 75 y.o. male now s/p Rt unicompartmental knee. PMH: CAD, anxiety, depression, chronic back pain, MI.     PT Comments    Patient is making good progress with PT.  From a mobility standpoint anticipate patient will be ready for DC home when medically ready.     Follow Up Recommendations  Home health PT;Supervision for mobility/OOB     Equipment Recommendations  None recommended by PT    Recommendations for Other Services       Precautions / Restrictions Precautions Precautions: Knee;Fall Restrictions Weight Bearing Restrictions: Yes RLE Weight Bearing: Weight bearing as tolerated    Mobility  Bed Mobility Overal bed mobility: Modified Independent Bed Mobility: Supine to Sit     Supine to sit: Modified independent (Device/Increase time)     General bed mobility comments: OOB in chair upon arrival  Transfers Overall transfer level: Modified independent Equipment used: Rolling walker (2 wheeled) Transfers: Sit to/from Stand Sit to Stand: Modified independent (Device/Increase time)            Ambulation/Gait Ambulation/Gait assistance: Supervision Ambulation Distance (Feet): 250 Feet Assistive device: Rolling walker (2 wheeled) Gait Pattern/deviations: Step-through pattern;Decreased stride length Gait velocity: decreased   General Gait Details: improved symmetrical step lengths; steady gait; cues for cadence   Stairs Stairs: Yes Stairs assistance: Min guard Stair Management: No rails;Backwards;With walker Number of Stairs: 2 General stair comments: educated on sequencing and technique; good safety awareness  Wheelchair Mobility    Modified Rankin (Stroke Patients Only)       Balance     Sitting balance-Leahy Scale: Good     Standing balance support: Single extremity supported Standing  balance-Leahy Scale: Fair                      Cognition Arousal/Alertness: Awake/alert Behavior During Therapy: WFL for tasks assessed/performed Overall Cognitive Status: Within Functional Limits for tasks assessed                      Exercises Total Joint Exercises Quad Sets: Strengthening;Right;10 reps;Seated Heel Slides: Right;Strengthening;Seated;15 reps Straight Leg Raises: Strengthening;Right;10 reps;Seated Long Arc Quad: Strengthening;Right;Seated;15 reps Goniometric ROM: 0-90    General Comments        Pertinent Vitals/Pain Pain Assessment: 0-10 Pain Score: 3  Pain Location: R knee Pain Descriptors / Indicators: Sore Pain Intervention(s): Monitored during session;Premedicated before session    Home Living Family/patient expects to be discharged to:: Private residence Living Arrangements: Spouse/significant other Available Help at Discharge: Family;Available 24 hours/day Type of Home: House Home Access: Stairs to enter Entrance Stairs-Rails: None Home Layout: Two level;Able to live on main level with bedroom/bathroom Home Equipment: Gilford Rile - 2 wheels;Bedside commode;Cane - single point;Shower seat;Grab bars - tub/shower Additional Comments: Spouse will not be able to provide any physical assistance at home. Daughter in town until Friday.     Prior Function Level of Independence: Independent with assistive device(s)      Comments: using SPC    PT Goals (current goals can now be found in the care plan section) Acute Rehab PT Goals Patient Stated Goal: be active with no pain PT Goal Formulation: With patient Time For Goal Achievement: 11/23/15 Potential to Achieve Goals: Good Progress towards PT goals: Progressing toward goals    Frequency  7X/week    PT Plan Current plan  remains appropriate    Co-evaluation             End of Session Equipment Utilized During Treatment: Gait belt Activity Tolerance: Patient tolerated treatment  well Patient left: with call bell/phone within reach;in chair     Time: [id number] PT Time Calculation (min) (ACUTE ONLY): 26 min  Charges:  $Gait Training: 8-22 mins $Therapeutic Exercise: 8-22 mins                    G Codes:      [place] April, PTA Pager: [phone]   11/10/2015, 10:02 AM

## 2015-11-10 NOTE — Progress Notes (Signed)
Orthopedic Tech Progress Note Patient Details:  [name] August 21, 1940 [id number]  Patient ID: [name], male   DOB: December 12, 1940, 75 y.o.   MRN: [id number] Applied cpm 0-60  Karolee Stamps 11/10/2015, 6:23 AM

## 2015-11-12 DIAGNOSIS — I119 Hypertensive heart disease without heart failure: Secondary | ICD-10-CM | POA: Diagnosis not present

## 2015-11-12 DIAGNOSIS — I255 Ischemic cardiomyopathy: Secondary | ICD-10-CM | POA: Diagnosis not present

## 2015-11-12 DIAGNOSIS — Z96651 Presence of right artificial knee joint: Secondary | ICD-10-CM | POA: Diagnosis not present

## 2015-11-12 DIAGNOSIS — I2510 Atherosclerotic heart disease of native coronary artery without angina pectoris: Secondary | ICD-10-CM | POA: Diagnosis not present

## 2015-11-12 DIAGNOSIS — Z471 Aftercare following joint replacement surgery: Secondary | ICD-10-CM | POA: Diagnosis not present

## 2015-11-14 DIAGNOSIS — I255 Ischemic cardiomyopathy: Secondary | ICD-10-CM | POA: Diagnosis not present

## 2015-11-14 DIAGNOSIS — I2510 Atherosclerotic heart disease of native coronary artery without angina pectoris: Secondary | ICD-10-CM | POA: Diagnosis not present

## 2015-11-14 DIAGNOSIS — Z471 Aftercare following joint replacement surgery: Secondary | ICD-10-CM | POA: Diagnosis not present

## 2015-11-14 DIAGNOSIS — I119 Hypertensive heart disease without heart failure: Secondary | ICD-10-CM | POA: Diagnosis not present

## 2015-11-14 DIAGNOSIS — Z96651 Presence of right artificial knee joint: Secondary | ICD-10-CM | POA: Diagnosis not present

## 2015-11-17 DIAGNOSIS — Z96651 Presence of right artificial knee joint: Secondary | ICD-10-CM | POA: Diagnosis not present

## 2015-11-17 DIAGNOSIS — Z471 Aftercare following joint replacement surgery: Secondary | ICD-10-CM | POA: Diagnosis not present

## 2015-11-17 DIAGNOSIS — I255 Ischemic cardiomyopathy: Secondary | ICD-10-CM | POA: Diagnosis not present

## 2015-11-17 DIAGNOSIS — I2510 Atherosclerotic heart disease of native coronary artery without angina pectoris: Secondary | ICD-10-CM | POA: Diagnosis not present

## 2015-11-17 DIAGNOSIS — I119 Hypertensive heart disease without heart failure: Secondary | ICD-10-CM | POA: Diagnosis not present

## 2015-11-21 DIAGNOSIS — I119 Hypertensive heart disease without heart failure: Secondary | ICD-10-CM | POA: Diagnosis not present

## 2015-11-21 DIAGNOSIS — I255 Ischemic cardiomyopathy: Secondary | ICD-10-CM | POA: Diagnosis not present

## 2015-11-21 DIAGNOSIS — Z471 Aftercare following joint replacement surgery: Secondary | ICD-10-CM | POA: Diagnosis not present

## 2015-11-21 DIAGNOSIS — Z96651 Presence of right artificial knee joint: Secondary | ICD-10-CM | POA: Diagnosis not present

## 2015-11-21 DIAGNOSIS — I2510 Atherosclerotic heart disease of native coronary artery without angina pectoris: Secondary | ICD-10-CM | POA: Diagnosis not present

## 2015-11-23 DIAGNOSIS — I2510 Atherosclerotic heart disease of native coronary artery without angina pectoris: Secondary | ICD-10-CM | POA: Diagnosis not present

## 2015-11-23 DIAGNOSIS — I255 Ischemic cardiomyopathy: Secondary | ICD-10-CM | POA: Diagnosis not present

## 2015-11-23 DIAGNOSIS — Z96651 Presence of right artificial knee joint: Secondary | ICD-10-CM | POA: Diagnosis not present

## 2015-11-23 DIAGNOSIS — Z471 Aftercare following joint replacement surgery: Secondary | ICD-10-CM | POA: Diagnosis not present

## 2015-11-23 DIAGNOSIS — M1711 Unilateral primary osteoarthritis, right knee: Secondary | ICD-10-CM | POA: Diagnosis not present

## 2015-11-23 DIAGNOSIS — I119 Hypertensive heart disease without heart failure: Secondary | ICD-10-CM | POA: Diagnosis not present

## 2015-11-25 DIAGNOSIS — M25661 Stiffness of right knee, not elsewhere classified: Secondary | ICD-10-CM | POA: Diagnosis not present

## 2015-11-25 DIAGNOSIS — M25561 Pain in right knee: Secondary | ICD-10-CM | POA: Diagnosis not present

## 2015-11-25 DIAGNOSIS — R262 Difficulty in walking, not elsewhere classified: Secondary | ICD-10-CM | POA: Diagnosis not present

## 2015-11-25 DIAGNOSIS — M25461 Effusion, right knee: Secondary | ICD-10-CM | POA: Diagnosis not present

## 2015-11-28 DIAGNOSIS — M25561 Pain in right knee: Secondary | ICD-10-CM | POA: Diagnosis not present

## 2015-11-28 DIAGNOSIS — M25661 Stiffness of right knee, not elsewhere classified: Secondary | ICD-10-CM | POA: Diagnosis not present

## 2015-11-28 DIAGNOSIS — M25461 Effusion, right knee: Secondary | ICD-10-CM | POA: Diagnosis not present

## 2015-11-30 DIAGNOSIS — M1711 Unilateral primary osteoarthritis, right knee: Secondary | ICD-10-CM | POA: Diagnosis not present

## 2015-12-01 DIAGNOSIS — R262 Difficulty in walking, not elsewhere classified: Secondary | ICD-10-CM | POA: Diagnosis not present

## 2015-12-01 DIAGNOSIS — M25461 Effusion, right knee: Secondary | ICD-10-CM | POA: Diagnosis not present

## 2015-12-01 DIAGNOSIS — M25561 Pain in right knee: Secondary | ICD-10-CM | POA: Diagnosis not present

## 2015-12-01 DIAGNOSIS — M25661 Stiffness of right knee, not elsewhere classified: Secondary | ICD-10-CM | POA: Diagnosis not present

## 2015-12-05 DIAGNOSIS — M25461 Effusion, right knee: Secondary | ICD-10-CM | POA: Diagnosis not present

## 2015-12-05 DIAGNOSIS — M25661 Stiffness of right knee, not elsewhere classified: Secondary | ICD-10-CM | POA: Diagnosis not present

## 2015-12-05 DIAGNOSIS — M25561 Pain in right knee: Secondary | ICD-10-CM | POA: Diagnosis not present

## 2015-12-05 DIAGNOSIS — R262 Difficulty in walking, not elsewhere classified: Secondary | ICD-10-CM | POA: Diagnosis not present

## 2015-12-07 DIAGNOSIS — M25661 Stiffness of right knee, not elsewhere classified: Secondary | ICD-10-CM | POA: Diagnosis not present

## 2015-12-07 DIAGNOSIS — M25561 Pain in right knee: Secondary | ICD-10-CM | POA: Diagnosis not present

## 2015-12-07 DIAGNOSIS — R262 Difficulty in walking, not elsewhere classified: Secondary | ICD-10-CM | POA: Diagnosis not present

## 2015-12-07 DIAGNOSIS — M25461 Effusion, right knee: Secondary | ICD-10-CM | POA: Diagnosis not present

## 2015-12-13 DIAGNOSIS — R262 Difficulty in walking, not elsewhere classified: Secondary | ICD-10-CM | POA: Diagnosis not present

## 2015-12-13 DIAGNOSIS — M25461 Effusion, right knee: Secondary | ICD-10-CM | POA: Diagnosis not present

## 2015-12-13 DIAGNOSIS — M25661 Stiffness of right knee, not elsewhere classified: Secondary | ICD-10-CM | POA: Diagnosis not present

## 2015-12-13 DIAGNOSIS — M25561 Pain in right knee: Secondary | ICD-10-CM | POA: Diagnosis not present

## 2015-12-15 DIAGNOSIS — R262 Difficulty in walking, not elsewhere classified: Secondary | ICD-10-CM | POA: Diagnosis not present

## 2015-12-15 DIAGNOSIS — M25661 Stiffness of right knee, not elsewhere classified: Secondary | ICD-10-CM | POA: Diagnosis not present

## 2015-12-15 DIAGNOSIS — M25561 Pain in right knee: Secondary | ICD-10-CM | POA: Diagnosis not present

## 2015-12-15 DIAGNOSIS — M25461 Effusion, right knee: Secondary | ICD-10-CM | POA: Diagnosis not present

## 2015-12-20 DIAGNOSIS — R262 Difficulty in walking, not elsewhere classified: Secondary | ICD-10-CM | POA: Diagnosis not present

## 2015-12-20 DIAGNOSIS — M25561 Pain in right knee: Secondary | ICD-10-CM | POA: Diagnosis not present

## 2015-12-20 DIAGNOSIS — M25661 Stiffness of right knee, not elsewhere classified: Secondary | ICD-10-CM | POA: Diagnosis not present

## 2015-12-20 DIAGNOSIS — M25461 Effusion, right knee: Secondary | ICD-10-CM | POA: Diagnosis not present

## 2015-12-21 DIAGNOSIS — M25461 Effusion, right knee: Secondary | ICD-10-CM | POA: Diagnosis not present

## 2015-12-22 DIAGNOSIS — M25661 Stiffness of right knee, not elsewhere classified: Secondary | ICD-10-CM | POA: Diagnosis not present

## 2015-12-22 DIAGNOSIS — M25461 Effusion, right knee: Secondary | ICD-10-CM | POA: Diagnosis not present

## 2015-12-22 DIAGNOSIS — R262 Difficulty in walking, not elsewhere classified: Secondary | ICD-10-CM | POA: Diagnosis not present

## 2015-12-22 DIAGNOSIS — M25561 Pain in right knee: Secondary | ICD-10-CM | POA: Diagnosis not present

## 2015-12-27 DIAGNOSIS — M25561 Pain in right knee: Secondary | ICD-10-CM | POA: Diagnosis not present

## 2015-12-27 DIAGNOSIS — M25661 Stiffness of right knee, not elsewhere classified: Secondary | ICD-10-CM | POA: Diagnosis not present

## 2015-12-27 DIAGNOSIS — M25461 Effusion, right knee: Secondary | ICD-10-CM | POA: Diagnosis not present

## 2015-12-27 DIAGNOSIS — R262 Difficulty in walking, not elsewhere classified: Secondary | ICD-10-CM | POA: Diagnosis not present

## 2015-12-29 DIAGNOSIS — R262 Difficulty in walking, not elsewhere classified: Secondary | ICD-10-CM | POA: Diagnosis not present

## 2015-12-29 DIAGNOSIS — M25561 Pain in right knee: Secondary | ICD-10-CM | POA: Diagnosis not present

## 2015-12-29 DIAGNOSIS — M25661 Stiffness of right knee, not elsewhere classified: Secondary | ICD-10-CM | POA: Diagnosis not present

## 2015-12-29 DIAGNOSIS — M25461 Effusion, right knee: Secondary | ICD-10-CM | POA: Diagnosis not present

## 2015-12-30 ENCOUNTER — Other Ambulatory Visit: Payer: Self-pay | Admitting: Oncology

## 2015-12-30 DIAGNOSIS — D696 Thrombocytopenia, unspecified: Secondary | ICD-10-CM

## 2015-12-30 DIAGNOSIS — D693 Immune thrombocytopenic purpura: Secondary | ICD-10-CM

## 2015-12-30 DIAGNOSIS — C8400 Mycosis fungoides, unspecified site: Secondary | ICD-10-CM

## 2016-01-03 DIAGNOSIS — M25661 Stiffness of right knee, not elsewhere classified: Secondary | ICD-10-CM | POA: Diagnosis not present

## 2016-01-03 DIAGNOSIS — R262 Difficulty in walking, not elsewhere classified: Secondary | ICD-10-CM | POA: Diagnosis not present

## 2016-01-03 DIAGNOSIS — M25561 Pain in right knee: Secondary | ICD-10-CM | POA: Diagnosis not present

## 2016-01-03 DIAGNOSIS — M25461 Effusion, right knee: Secondary | ICD-10-CM | POA: Diagnosis not present

## 2016-01-06 DIAGNOSIS — M25561 Pain in right knee: Secondary | ICD-10-CM | POA: Diagnosis not present

## 2016-01-06 DIAGNOSIS — R262 Difficulty in walking, not elsewhere classified: Secondary | ICD-10-CM | POA: Diagnosis not present

## 2016-01-06 DIAGNOSIS — M25661 Stiffness of right knee, not elsewhere classified: Secondary | ICD-10-CM | POA: Diagnosis not present

## 2016-01-06 DIAGNOSIS — M25461 Effusion, right knee: Secondary | ICD-10-CM | POA: Diagnosis not present

## 2016-01-18 DIAGNOSIS — M25461 Effusion, right knee: Secondary | ICD-10-CM | POA: Diagnosis not present

## 2016-01-18 DIAGNOSIS — R262 Difficulty in walking, not elsewhere classified: Secondary | ICD-10-CM | POA: Diagnosis not present

## 2016-01-18 DIAGNOSIS — M25661 Stiffness of right knee, not elsewhere classified: Secondary | ICD-10-CM | POA: Diagnosis not present

## 2016-01-18 DIAGNOSIS — M25561 Pain in right knee: Secondary | ICD-10-CM | POA: Diagnosis not present

## 2016-01-20 DIAGNOSIS — M25461 Effusion, right knee: Secondary | ICD-10-CM | POA: Diagnosis not present

## 2016-01-20 DIAGNOSIS — R262 Difficulty in walking, not elsewhere classified: Secondary | ICD-10-CM | POA: Diagnosis not present

## 2016-01-20 DIAGNOSIS — M25561 Pain in right knee: Secondary | ICD-10-CM | POA: Diagnosis not present

## 2016-01-20 DIAGNOSIS — M25661 Stiffness of right knee, not elsewhere classified: Secondary | ICD-10-CM | POA: Diagnosis not present

## 2016-01-24 DIAGNOSIS — M25461 Effusion, right knee: Secondary | ICD-10-CM | POA: Diagnosis not present

## 2016-01-24 DIAGNOSIS — M25561 Pain in right knee: Secondary | ICD-10-CM | POA: Diagnosis not present

## 2016-01-24 DIAGNOSIS — R262 Difficulty in walking, not elsewhere classified: Secondary | ICD-10-CM | POA: Diagnosis not present

## 2016-01-24 DIAGNOSIS — M25661 Stiffness of right knee, not elsewhere classified: Secondary | ICD-10-CM | POA: Diagnosis not present

## 2016-02-07 DIAGNOSIS — H903 Sensorineural hearing loss, bilateral: Secondary | ICD-10-CM | POA: Diagnosis not present

## 2016-02-07 DIAGNOSIS — H9113 Presbycusis, bilateral: Secondary | ICD-10-CM | POA: Diagnosis not present

## 2016-02-07 DIAGNOSIS — H9311 Tinnitus, right ear: Secondary | ICD-10-CM | POA: Diagnosis not present

## 2016-02-10 DIAGNOSIS — Z23 Encounter for immunization: Secondary | ICD-10-CM | POA: Diagnosis not present

## 2016-02-13 ENCOUNTER — Other Ambulatory Visit (INDEPENDENT_AMBULATORY_CARE_PROVIDER_SITE_OTHER): Payer: Medicare Other

## 2016-02-13 DIAGNOSIS — D696 Thrombocytopenia, unspecified: Secondary | ICD-10-CM | POA: Diagnosis present

## 2016-02-13 DIAGNOSIS — C8400 Mycosis fungoides, unspecified site: Secondary | ICD-10-CM

## 2016-02-13 DIAGNOSIS — D693 Immune thrombocytopenic purpura: Secondary | ICD-10-CM

## 2016-02-13 LAB — CBC WITH DIFFERENTIAL/PLATELET
BASOS ABS: 0 10*3/uL (ref 0.0–0.1)
Basophils Relative: 0 %
EOS PCT: 2 %
Eosinophils Absolute: 0.1 10*3/uL (ref 0.0–0.7)
HEMATOCRIT: 45.5 % (ref 39.0–52.0)
Hemoglobin: 14.6 g/dL (ref 13.0–17.0)
LYMPHS ABS: 1.5 10*3/uL (ref 0.7–4.0)
LYMPHS PCT: 32 %
MCH: 32.5 pg (ref 26.0–34.0)
MCHC: 32.1 g/dL (ref 30.0–36.0)
MCV: 101.3 fL — AB (ref 78.0–100.0)
MONO ABS: 0.3 10*3/uL (ref 0.1–1.0)
Monocytes Relative: 6 %
NEUTROS ABS: 2.7 10*3/uL (ref 1.7–7.7)
Neutrophils Relative %: 59 %
Platelets: 100 10*3/uL — ABNORMAL LOW (ref 150–400)
RBC: 4.49 MIL/uL (ref 4.22–5.81)
RDW: 13.1 % (ref 11.5–15.5)
WBC: 4.5 10*3/uL (ref 4.0–10.5)

## 2016-02-13 LAB — COMPREHENSIVE METABOLIC PANEL
ALT: 22 U/L (ref 17–63)
AST: 24 U/L (ref 15–41)
Albumin: 3.7 g/dL (ref 3.5–5.0)
Alkaline Phosphatase: 73 U/L (ref 38–126)
Anion gap: 4 — ABNORMAL LOW (ref 5–15)
BILIRUBIN TOTAL: 1 mg/dL (ref 0.3–1.2)
BUN: 15 mg/dL (ref 6–20)
CO2: 28 mmol/L (ref 22–32)
CREATININE: 0.83 mg/dL (ref 0.61–1.24)
Calcium: 9 mg/dL (ref 8.9–10.3)
Chloride: 107 mmol/L (ref 101–111)
Glucose, Bld: 123 mg/dL — ABNORMAL HIGH (ref 65–99)
Potassium: 4.2 mmol/L (ref 3.5–5.1)
Sodium: 139 mmol/L (ref 135–145)
TOTAL PROTEIN: 5.4 g/dL — AB (ref 6.5–8.1)

## 2016-02-13 LAB — LACTATE DEHYDROGENASE: LDH: 165 U/L (ref 98–192)

## 2016-02-14 ENCOUNTER — Telehealth: Payer: Self-pay | Admitting: *Deleted

## 2016-02-14 NOTE — Telephone Encounter (Signed)
-----   Message from Johnathan Belt, MD sent at 02/13/2016  5:30 PM EDT ----- Call pt lab stable: platelets 100,000, Hb 14.6

## 2016-02-14 NOTE — Telephone Encounter (Signed)
Pt called/informed "labs stable: platelets 100,000, Hb 14.6 " per Dr Beryle Beams. Stated he had already seen them on "the internet".

## 2016-02-15 DIAGNOSIS — M25461 Effusion, right knee: Secondary | ICD-10-CM | POA: Diagnosis not present

## 2016-02-20 ENCOUNTER — Ambulatory Visit (INDEPENDENT_AMBULATORY_CARE_PROVIDER_SITE_OTHER): Payer: Medicare Other | Admitting: Oncology

## 2016-02-20 ENCOUNTER — Encounter: Payer: Self-pay | Admitting: Oncology

## 2016-02-20 VITALS — BP 126/60 | HR 56 | Temp 97.8°F | Ht 65.0 in | Wt 139.2 lb

## 2016-02-20 DIAGNOSIS — Z955 Presence of coronary angioplasty implant and graft: Secondary | ICD-10-CM

## 2016-02-20 DIAGNOSIS — Z888 Allergy status to other drugs, medicaments and biological substances status: Secondary | ICD-10-CM

## 2016-02-20 DIAGNOSIS — C8400 Mycosis fungoides, unspecified site: Secondary | ICD-10-CM

## 2016-02-20 DIAGNOSIS — Z7982 Long term (current) use of aspirin: Secondary | ICD-10-CM

## 2016-02-20 DIAGNOSIS — I252 Old myocardial infarction: Secondary | ICD-10-CM | POA: Diagnosis not present

## 2016-02-20 DIAGNOSIS — Z832 Family history of diseases of the blood and blood-forming organs and certain disorders involving the immune mechanism: Secondary | ICD-10-CM | POA: Diagnosis not present

## 2016-02-20 DIAGNOSIS — Z86010 Personal history of colonic polyps: Secondary | ICD-10-CM | POA: Diagnosis not present

## 2016-02-20 DIAGNOSIS — Z7901 Long term (current) use of anticoagulants: Secondary | ICD-10-CM

## 2016-02-20 DIAGNOSIS — D693 Immune thrombocytopenic purpura: Secondary | ICD-10-CM | POA: Diagnosis present

## 2016-02-20 DIAGNOSIS — Z96651 Presence of right artificial knee joint: Secondary | ICD-10-CM | POA: Diagnosis not present

## 2016-02-20 DIAGNOSIS — Z85828 Personal history of other malignant neoplasm of skin: Secondary | ICD-10-CM | POA: Diagnosis not present

## 2016-02-20 DIAGNOSIS — I2510 Atherosclerotic heart disease of native coronary artery without angina pectoris: Secondary | ICD-10-CM

## 2016-02-20 HISTORY — DX: Long term (current) use of anticoagulants: Z79.01

## 2016-02-20 NOTE — Progress Notes (Signed)
Hematology and Oncology Follow Up Visit  [name] [id number] 1940-07-09 75 y.o. 02/20/2016 6:41 PM   Principle Diagnosis: Encounter Diagnoses  Name Primary?  . Chronic anticoagulation   . Chronic ITP (idiopathic thrombocytopenia) (HCC) Yes  . Mycosis fungoides, stage 1 ([place])      Interim History:   Overall doing well. Since his last visit with me he had a right partial knee replacement on 11/08/2015 by Dr. Edmonia Lynch. He tolerated the procedure well. He is recuperating without incident. He has noted no new skin rashes. No clinical bleeding. No headache, change in vision, trouble swallowing, dyspnea, chest pain or palpitations, although apparently he had transient bigeminal rhythm appreciated by his cardiologist. No change in bowel habit.  Medications: reviewed  Allergies:  Allergies  Allergen Reactions  . Other Other (See Comments)    Antidepressants...has a variety of reactions to different antidepressants  . Prozac [Fluoxetine Hcl] Other (See Comments)    nightmares    Review of Systems: See interim history Remaining ROS negative:   Physical Exam: Blood pressure 126/60, pulse (!) 56, temperature 97.8 F (36.6 C), temperature source Oral, height 5\' 5"  (1.651 m), weight 139 lb 3.2 oz (63.1 kg), SpO2 100 %. Wt Readings from Last 3 Encounters:  02/20/16 139 lb 3.2 oz (63.1 kg)  11/08/15 141 lb (64 kg)  10/28/15 141 lb 9 oz (64.2 kg)     General appearance: Thin Caucasian man HENNT: Pharynx no erythema, exudate, mass, or ulcer. No thyromegaly or thyroid nodules Lymph nodes: No cervical, supraclavicular, or axillary lymphadenopathy Breasts:  Lungs: Clear to auscultation, resonant to percussion throughout Heart: Regular rhythm, no murmur, no gallop, no rub, no click, no edema Abdomen: Soft, nontender, normal bowel sounds, no mass, no organomegaly Extremities: No edema, no calf tenderness Musculoskeletal: no joint deformities. Scar over right knee from recent  total knee replacement. GU:  Vascular: Carotid pulses 2+, no bruits,  Neurologic: Alert, oriented, PERRLA, optic discs sharp and vessels normal, no hemorrhage or exudate, cranial nerves grossly normal, motor strength 5 over 5, reflexes 1+ symmetric upper extremities, 1+ at the left knee, not checked on the right due to recent surgery., upper body coordination normal, gait normal, Skin: No rash or ecchymosis  Lab Results: CBC W/Diff    Component Value Date/Time   WBC 4.5 02/13/2016 1354   RBC 4.49 02/13/2016 1354   HGB 14.6 02/13/2016 1354   HGB 14.6 03/23/2013 1424   HCT 45.5 02/13/2016 1354   HCT 45.4 03/23/2013 1424   PLT 100 (L) 02/13/2016 1354   PLT 108 (L) 03/23/2013 1424   MCV 101.3 (H) 02/13/2016 1354   MCV 100.0 (H) 03/23/2013 1424   MCH 32.5 02/13/2016 1354   MCHC 32.1 02/13/2016 1354   RDW 13.1 02/13/2016 1354   RDW 12.7 03/23/2013 1424   LYMPHSABS 1.5 02/13/2016 1354   LYMPHSABS 1.1 03/23/2013 1424   MONOABS 0.3 02/13/2016 1354   MONOABS 0.4 03/23/2013 1424   EOSABS 0.1 02/13/2016 1354   EOSABS 0.1 03/23/2013 1424   BASOSABS 0.0 02/13/2016 1354   BASOSABS 0.0 03/23/2013 1424     Chemistry      Component Value Date/Time   NA 139 02/13/2016 1354   NA 142 03/23/2013 1424   K 4.2 02/13/2016 1354   K 4.7 03/23/2013 1424   CL 107 02/13/2016 1354   CL 106 09/22/2012 1428   CO2 28 02/13/2016 1354   CO2 28 03/23/2013 1424   BUN 15 02/13/2016 1354   BUN 18.1  03/23/2013 1424   CREATININE 0.83 02/13/2016 1354   CREATININE 0.85 10/25/2014 1040   CREATININE 0.8 03/23/2013 1424      Component Value Date/Time   CALCIUM 9.0 02/13/2016 1354   CALCIUM 9.4 03/23/2013 1424   ALKPHOS 73 02/13/2016 1354   ALKPHOS 62 03/23/2013 1424   AST 24 02/13/2016 1354   AST 25 03/23/2013 1424   ALT 22 02/13/2016 1354   ALT 27 03/23/2013 1424   BILITOT 1.0 02/13/2016 1354   BILITOT 0.86 03/23/2013 1424       Radiological Studies: No results found.  Impression:  #1. Chronic  ITP-likely paraneoplastic related to underlying mycosis fungoides lymphoma Transient fall in his counts back in April now recovered to his baseline. Suspect this might have been due to an intercurrent viral infection with transient bone marrow suppression.   #2. Stage I mycosis fungoides Minimal skin involvement treated with local treatment and no evidence of recurrence at this time. He continues dermatology follow-up.  #3. Coronary artery disease status post MI January 2009 status post angioplasty and stent to the LAD  #4. Squamous cell carcinoma of the scalp treated with primary excision  #5. Tubular adenoma of the colon resected; continue surveillance colonoscopies. Last procedure done April 2013.  #6. Degenerative arthritis status post partial right knee replacement surgery He is currently on prophylactic dose Xarelto for a planned 4 week course.  #7. Inherited factor VII deficiency? Patient's daughter does have factor VII deficiency but she is not my patient and I don't have any hard data on her. Mr. Luman has normal PT and PTT which would make it extremely unlikely that he has a factor VII deficiency. If he does, it is so mild that it does not affect his prothrombin time. He had no problems with recent orthopedic surgery and in fact is currently on prophylactic anticoagulation which he is tolerating well. I told the patient I would have to go back to the old computer system to see a Vicodin to get any prior lab that we might of done on him with respect to factor levels.    CC: Patient Care Team: Eulas Post, MD as PCP - General (Family Medicine) Annia Belt, MD as Consulting Physician (Oncology) Josue Hector, MD as Consulting Physician (Cardiology) Jari Pigg, MD as Consulting Physician (Dermatology) Renaldo Harrison, MD as Referring Physician (Dermatology)   Annia Belt, MD 10/9/20176:41 PM

## 2016-02-20 NOTE — Patient Instructions (Signed)
Lab in 6 months on August 13, 2016 Then Lab 1 week before visit in 1 year

## 2016-03-06 ENCOUNTER — Encounter: Payer: Self-pay | Admitting: Cardiovascular Disease

## 2016-03-12 DIAGNOSIS — H52202 Unspecified astigmatism, left eye: Secondary | ICD-10-CM | POA: Diagnosis not present

## 2016-03-12 DIAGNOSIS — H43813 Vitreous degeneration, bilateral: Secondary | ICD-10-CM | POA: Diagnosis not present

## 2016-03-12 DIAGNOSIS — H40011 Open angle with borderline findings, low risk, right eye: Secondary | ICD-10-CM | POA: Diagnosis not present

## 2016-03-12 DIAGNOSIS — H40012 Open angle with borderline findings, low risk, left eye: Secondary | ICD-10-CM | POA: Diagnosis not present

## 2016-03-19 ENCOUNTER — Encounter: Payer: Self-pay | Admitting: Cardiovascular Disease

## 2016-03-19 ENCOUNTER — Ambulatory Visit (INDEPENDENT_AMBULATORY_CARE_PROVIDER_SITE_OTHER): Payer: Medicare Other | Admitting: Cardiovascular Disease

## 2016-03-19 VITALS — BP 130/70 | HR 56 | Ht 66.0 in | Wt 140.8 lb

## 2016-03-19 DIAGNOSIS — E7800 Pure hypercholesterolemia, unspecified: Secondary | ICD-10-CM

## 2016-03-19 DIAGNOSIS — I10 Essential (primary) hypertension: Secondary | ICD-10-CM

## 2016-03-19 DIAGNOSIS — I2510 Atherosclerotic heart disease of native coronary artery without angina pectoris: Secondary | ICD-10-CM | POA: Diagnosis not present
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
## 2016-03-19 MED ORDER — LOSARTAN POTASSIUM 50 MG PO TABS: 50.0000 mg | ORAL_TABLET | Freq: Every day | ORAL | 3 refills | Status: DC

## 2016-03-19 MED ORDER — ATORVASTATIN CALCIUM 20 MG PO TABS: 20.0000 mg | ORAL_TABLET | Freq: Every day | ORAL | 3 refills | Status: DC

## 2016-03-19 MED ORDER — CARVEDILOL 3.125 MG PO TABS: 3.1250 mg | ORAL_TABLET | Freq: Two times a day (BID) | ORAL | 3 refills | Status: DC

## 2016-03-19 MED ORDER — NITROGLYCERIN 0.4 MG SL SUBL: 0.4000 mg | SUBLINGUAL_TABLET | SUBLINGUAL | 6 refills | Status: DC | PRN

## 2016-03-19 NOTE — Progress Notes (Signed)
Chief Complaint  Patient presents with  . Coronary Artery Disease  '  History of Present Illness: 75 yo WM with history of CAD, HTN, hyperlipidemia, idiopathic thrombocytopenia and OSA here today for cardiac follow up. In January of 2009 he had an anterior MI treated with a drug-eluting stent in the LAD. His ejection fraction improved from 25% to 40-50% following the first event. No evidence of AAA on abdominal aortic u/s December 2013. He has been diagnosed with T-cell lymphoma but this is in remission. We arranged a stress myoview on 09/25/12. He exercised for over 10 minutes with no chest pain but his stress test suggested scar in the anterior wall, apex and inferoapical wall with LVEF of 55%. NO ischemic EKG changes. Cardiac cath 10/25/12 with mild to moderate CAD, no obstructive lesions.   He is here today for follow up. He has no chest pain or SOB. He had a right partial knee replacement. He did well.   Primary Care Physician: Eulas Post, MD   Past Medical History:  Diagnosis Date  . Allergy   . Anxiety   . Arthritis    "all over" (25-Oct-2012)  . Basal cell carcinoma of head    "right side; top of head" (2012-10-25)  . Benign prostatic hypertrophy   . CAD (coronary artery disease)   . Chronic anticoagulation 02/20/2016  . Chronic lower back pain    "from a herniated disc" (10-25-12)  . Depression   . Diverticulosis of colon   . Dysrhythmia   . Factor VII deficiency ([place])    10/28/15: No history of factor VII deficiency per Dr. Beryle Beams  . History of blood transfusion 1941  . History of colon polyps   . Hyperlipidemia   . ITP (idiopathic thrombocytopenic purpura)   . Kidney stones    "I've had them twice; last time was ~ 9 yr ago; both times passed on their own" (10-25-12)  . Myocardial infarction 2009  . Pneumonia    "I think I had as a child" (Oct 25, 2012)  . Squamous carcinoma    "left side; top of head" (2012-10-25)  . T-cell lymphoma ([place])    "I'm in stage  I; original site was on my left chest; had a place around my waist treated last summer; got a place on back of my left leg" (10/25/12)    Past Surgical History:  Procedure Laterality Date  . CARDIAC CATHETERIZATION  October 25, 2012  . CATARACT EXTRACTION W/ INTRAOCULAR LENS  IMPLANT, BILATERAL Bilateral 2000's  . COLONOSCOPY    . CORONARY ANGIOPLASTY WITH STENT PLACEMENT  05/29/2007   "1" (10-25-2012)  . CYST EXCISION  1980's?   "or fatty tumor; taken off my back" (10/25/12)  . LEFT HEART CATHETERIZATION WITH CORONARY ANGIOGRAM N/A 10-25-12   Procedure: LEFT HEART CATHETERIZATION WITH CORONARY ANGIOGRAM;  Surgeon: Burnell Blanks, MD;  Location: [facility] C[facility] LAB;  Service: Cardiovascular;  Laterality: N/A;  . LUMBAR EPIDURAL INJECTION  ~ 2004  . MOHS SURGERY Bilateral 2011-2012   "top of my head" (10-25-2012)  . PARTIAL KNEE ARTHROPLASTY Right 11/08/2015   Procedure: RIGHT UNICOMPARTMENTAL KNEE;  Surgeon: Renette Butters, MD;  Location: [place];  Service: Orthopedics;  Laterality: Right;  . TONSILLECTOMY  ~ 1945  . VASECTOMY      Current Outpatient Prescriptions  Medication Sig Dispense Refill  . acetaminophen (TYLENOL) 650 MG CR tablet Take 650 mg by mouth 2 (two) times daily.     Marland Kitchen ALPRAZolam (XANAX) 0.5 MG tablet Take 1 tablet (  0.5 mg total) by mouth 2 (two) times daily as needed for anxiety (for sleep). 60 tablet 5  . aspirin 81 MG tablet Take 81 mg by mouth daily.      Marland Kitchen atorvastatin (LIPITOR) 20 MG tablet Take 1 tablet (20 mg total) by mouth daily. 90 tablet 3  . carvedilol (COREG) 3.125 MG tablet Take 1 tablet (3.125 mg total) by mouth 2 (two) times daily with a meal. 180 tablet 3  . Cholecalciferol (VITAMIN D3) 1000 UNITS tablet Take 1,000 Units by mouth daily.     . Cyanocobalamin (VITAMIN B 12 PO) Take 500 mg by mouth daily.    Marland Kitchen losartan (COZAAR) 50 MG tablet Take 1 tablet (50 mg total) by mouth daily. 90 tablet 3  . Magnesium 250 MG TABS Take 250 mg by mouth daily.     .  nitroGLYCERIN (NITROSTAT) 0.4 MG SL tablet Place 1 tablet (0.4 mg total) under the tongue every 5 (five) minutes as needed for chest pain (for chest pain). 25 tablet 6  . Omega-3 Fatty Acids (FISH OIL) 1000 MG CAPS Take 1,000 mg by mouth 2 (two) times daily.     . Saw Palmetto 450 MG CAPS Take 450 mg by mouth 2 (two) times daily.      No current facility-administered medications for this visit.     Allergies  Allergen Reactions  . Fluoxetine Hcl Other (See Comments)    nightmares  . Other Other (See Comments)    ANTI-DEPRESSANTS. ANTI-DEPRESSANTS Antidepressants...has a variety of reactions to different antidepressants  . Prozac [Fluoxetine Hcl] Other (See Comments)    nightmares  . Oxycodone Other (See Comments)    unknown    Social History   Social History  . Marital status: Married    Spouse name: N/A  . Number of children: 2  . Years of education: N/A   Occupational History  . Retired    Social History Main Topics  . Smoking status: Never Smoker  . Smokeless tobacco: Never Used  . Alcohol use No  . Drug use: No  . Sexual activity: Not Currently   Other Topics Concern  . Not on file   Social History Narrative   No caffeine     Family History  Problem Relation Age of Onset  . Alzheimer's disease Mother   . Lymphoma Father   . Coronary artery disease Brother   . COPD Brother   . Diabetes Brother   . Colon cancer Paternal Uncle   . Diabetes Brother     Review of Systems:  As stated in the HPI and otherwise negative.   BP 130/70   Pulse (!) 56   Ht 5\' 6"  (1.676 m)   Wt 140 lb 12.8 oz (63.9 kg)   BMI 22.73 kg/m   Physical Examination: General: Well developed, well nourished, NAD  HEENT: OP clear, mucus membranes moist  SKIN: warm, dry. No rashes. Neuro: No focal deficits  Musculoskeletal: Muscle strength 5/5 all ext  Psychiatric: Mood and affect normal  Neck: No JVD, no carotid bruits, no thyromegaly, no lymphadenopathy.  Lungs:Clear bilaterally,  no wheezes, rhonci, crackles Cardiovascular: Regular rate and rhythm. No murmurs, gallops or rubs. Abdomen:Soft. Bowel sounds present. Non-tender.  Extremities: No lower extremity edema. Pulses are 2 + in the bilateral DP/PT.  Stress myoview 09/25/12:  Rest Procedure: Myocardial perfusion imaging was performed at rest 45 minutes following the intravenous administration of Technetium 39m Sestamibi.  Rest ECG: NSR old anterior MI  Stress Procedure: The  patient exercised on the treadmill utilizing the Bruce Protocol for 10:15 minutes. The patient stopped due to fatigue, and chest tightness 4/10. This patient had rare pacs/pvcs with exercise.Technetium 44m Sestamibi was injected at peak exercise and myocardial perfusion imaging was performed after a brief delay.  Stress ECG: No significant change from baseline ECG  QPS  Raw Data Images: Normal; no motion artifact; normal heart/lung ratio.  Stress Images: There is decreased uptake in the anterior wall.  Rest Images: There is decreased uptake in the anterior wall.  Subtraction (SDS): There is a fixed defect that is most consistent with a previous infarction.  Transient Ischemic Dilatation (Normal <1.22): 0.92  Lung/Heart Ratio (Normal <0.45): 0.21  Quantitative Gated Spect Images  QGS EDV: 93 ml  QGS ESV: 42 ml  Impression  Exercise Capacity: Excellent exercise capacity.  BP Response: Normal blood pressure response.  Clinical Symptoms: Chest tightness  ECG Impression: No significant ST segment change suggestive of ischemia.  Comparison with Prior Nuclear Study: No images to compare  Overall Impression: Intermediate risk stress nuclear study Moderate mid and distal anterior wall, apical and inferoapical wall infarct with no ischemia.  LV Ejection Fraction: 55%. LV Wall Motion: EF reported as normal but clearly there is an anteroapical wall motion abnormality. Suggest MRI to quantitate and assess scar burden   Cardiac cath may 2014: Left main:  No obstructive disease.  Left Anterior Descending Artery: Large caliber vessel that courses to the apex. The proximal vessel has mild plaque disease. The mid stented segment is patent. The small caliber diagonal branch is patent with ostial 70% stenosis as it is jailed by the stent and unchanged in appearance.  Circumflex Artery: Moderate caliber vessel with moderate caliber obtuse marginal branch with no significant disease. The AV groove Circumflex has a 40% stenosis just after the takeoff of the OM branch, unchanged.  Right Coronary Artery: Large, dominant vessel with serial 20% lesions throughout the proximal vessel. The mid vessel has a 40% stenosis. The distal vessel has mild plaque disease.  Left Ventricular Angiogram: LVEF 55% with anterior and apical hypokinesis.   EKG:  EKG is ordered today. The ekg ordered today demonstrates Sinus bradycardia, rate 56 bpm. Poor R wave progression precordial leads.   Recent Labs: 02/13/2016: ALT 22; BUN 15; Creatinine, Ser 0.83; Hemoglobin 14.6; Platelets 100; Potassium 4.2; Sodium 139   Lipid Panel Lipid Panel     Component Value Date/Time   CHOL 118 08/15/2015 0936   TRIG 66.0 08/15/2015 0936   HDL 61.90 08/15/2015 0936   CHOLHDL 2 08/15/2015 0936   VLDL 13.2 08/15/2015 0936   LDLCALC 42 08/15/2015 0936   LDLDIRECT 137.3 08/08/2006 1059      Wt Readings from Last 3 Encounters:  03/19/16 140 lb 12.8 oz (63.9 kg)  02/20/16 139 lb 3.2 oz (63.1 kg)  11/08/15 141 lb (64 kg)     Other studies Reviewed: Additional studies/ records that were reviewed today include: . Review of the above records demonstrates:   Assessment and Plan:   1. CAD without angina: He has no chest pain suggestive of angina. Continue current meds. His medications are ASA, statin, beta blocker, Ace-inh. Repeat echo next year to assess LVEF.   2. HTN: BP controlled. No changes.   3. HLD: Lipids well controlled. Continue statin.   Current medicines are reviewed at  length with the patient today.  The patient does not have concerns regarding medicines.  The following changes have been made:  no change  Labs/ tests ordered today include:   Orders Placed This Encounter  Procedures  . EKG 12-Lead    Disposition:   FU with me in 6 months  Signed, Lauree Chandler, MD 03/19/2016 4:40 PM    [facility] [address], [place], [place]  [address] Phone: [phone]; Fax: [phone]

## 2016-03-19 NOTE — Patient Instructions (Signed)
Medication Instructions:  Your physician recommends that you continue on your current medications as directed. Please refer to the Current Medication list given to you today.   Labwork: none  Testing/Procedures: none  Follow-Up: Your physician recommends that you schedule a follow-up appointment in: 12 months.  Please call our office in July or August to schedule this appointment.     Any Other Special Instructions Will Be Listed Below (If Applicable).     If you need a refill on your cardiac medications before your next appointment, please call your pharmacy.

## 2016-04-10 DIAGNOSIS — H903 Sensorineural hearing loss, bilateral: Secondary | ICD-10-CM | POA: Diagnosis not present

## 2016-04-27 DIAGNOSIS — M5416 Radiculopathy, lumbar region: Secondary | ICD-10-CM | POA: Diagnosis not present

## 2016-04-27 DIAGNOSIS — M545 Low back pain: Secondary | ICD-10-CM | POA: Diagnosis not present

## 2016-05-10 DIAGNOSIS — M545 Low back pain: Secondary | ICD-10-CM | POA: Diagnosis not present

## 2016-05-18 DIAGNOSIS — M48062 Spinal stenosis, lumbar region with neurogenic claudication: Secondary | ICD-10-CM | POA: Diagnosis not present

## 2016-05-28 DIAGNOSIS — M4603 Spinal enthesopathy, cervicothoracic region: Secondary | ICD-10-CM | POA: Diagnosis not present

## 2016-05-28 DIAGNOSIS — M545 Low back pain: Secondary | ICD-10-CM | POA: Diagnosis not present

## 2016-05-28 DIAGNOSIS — M47817 Spondylosis without myelopathy or radiculopathy, lumbosacral region: Secondary | ICD-10-CM | POA: Diagnosis not present

## 2016-06-06 DIAGNOSIS — M545 Low back pain: Secondary | ICD-10-CM | POA: Diagnosis not present

## 2016-06-06 DIAGNOSIS — M47817 Spondylosis without myelopathy or radiculopathy, lumbosacral region: Secondary | ICD-10-CM | POA: Diagnosis not present

## 2016-06-18 ENCOUNTER — Encounter: Payer: Self-pay | Admitting: Gastroenterology

## 2016-06-19 ENCOUNTER — Encounter: Payer: Self-pay | Admitting: Gastroenterology

## 2016-06-26 DIAGNOSIS — M47817 Spondylosis without myelopathy or radiculopathy, lumbosacral region: Secondary | ICD-10-CM | POA: Diagnosis not present

## 2016-06-26 DIAGNOSIS — M545 Low back pain: Secondary | ICD-10-CM | POA: Diagnosis not present

## 2016-07-24 ENCOUNTER — Encounter: Payer: Self-pay | Admitting: Gastroenterology

## 2016-07-24 ENCOUNTER — Ambulatory Visit (INDEPENDENT_AMBULATORY_CARE_PROVIDER_SITE_OTHER): Payer: Medicare Other | Admitting: Gastroenterology

## 2016-07-24 VITALS — BP 122/70 | HR 68 | Ht 65.0 in | Wt 141.4 lb

## 2016-07-24 DIAGNOSIS — Z86010 Personal history of colonic polyps: Secondary | ICD-10-CM | POA: Diagnosis not present

## 2016-07-24 MED ORDER — NA SULFATE-K SULFATE-MG SULF 17.5-3.13-1.6 GM/177ML PO SOLN: 1.0000 | Freq: Once | ORAL | 0 refills | Status: AC

## 2016-07-24 NOTE — Progress Notes (Signed)
Review of pertinent gastrointestinal problems: 1. Adenomatous colon polyps. Colonoscopy Dr. Ardis Hughs 2008 found two subcentimeter adenomas. Repeat colonoscopy Dr. Ardis Hughs 2013 found a single subcentimeter adenoma. Recommended 5 year recall.   HPI: [name] whom I asked to come by to discuss polyp surveillance colonoscopy  Chief complaint is personal history of adenomatous colon polyps   He has had no significant GI issues except for very mild intermittent rectal bleeding which is been going on for years for him, attributed to intermittent hemorrhoids. He did have some constipation around the time of the knee surgery. That was temporary. No significant abdominal pains.      Review of systems: Pertinent positive and negative review of systems were noted in the above HPI section. Complete review of systems was performed and was otherwise normal.   Past Medical History:  Diagnosis Date  . Allergy   . Anxiety   . Arthritis    "all over" (10/24/2012)  . Basal cell carcinoma of head    "right side; top of head" (10-24-12)  . Benign prostatic hypertrophy   . CAD (coronary artery disease)   . Chronic anticoagulation 02/20/2016  . Chronic lower back pain    "from a herniated disc" (October 24, 2012)  . Depression   . Diverticulosis of colon   . Dysrhythmia   . Factor VII deficiency ([place])    10/28/15: No history of factor VII deficiency per Dr. Beryle Beams  . History of blood transfusion 1941  . History of colon polyps   . Hyperlipidemia   . ITP (idiopathic thrombocytopenic purpura)   . Kidney stones    "I've had them twice; last time was ~ 9 yr ago; both times passed on their own" (10/24/12)  . Myocardial infarction 2009  . Pneumonia    "I think I had as a child" (10-24-12)  . Squamous carcinoma    "left side; top of head" (October 24, 2012)  . T-cell lymphoma ([place])    "I'm in stage I; original site was on my left chest; had a place around my waist treated last  summer; got a place on back of my left leg" (10-24-2012)    Past Surgical History:  Procedure Laterality Date  . CARDIAC CATHETERIZATION  2012/10/24  . CATARACT EXTRACTION W/ INTRAOCULAR LENS  IMPLANT, BILATERAL Bilateral 2000's  . COLONOSCOPY    . CORONARY ANGIOPLASTY WITH STENT PLACEMENT  05/29/2007   "1" (10/24/12)  . CYST EXCISION  1980's?   "or fatty tumor; taken off my back" (10/24/2012)  . LEFT HEART CATHETERIZATION WITH CORONARY ANGIOGRAM N/A October 24, 2012   Procedure: LEFT HEART CATHETERIZATION WITH CORONARY ANGIOGRAM;  Surgeon: Burnell Blanks, MD;  Location[facility] CATH LAB;  Service: Cardiovascular;  Laterality: N/A;  . LUMBAR EPIDURAL INJECTION  ~ 2004  . MOHS SURGERY Bilateral 2011-2012   "top of my head" (10-24-12)  . PARTIAL KNEE ARTHROPLASTY Right 11/08/2015   Procedure: RIGHT UNICOMPARTMENTAL KNEE;  Surgeon: Renette Butters, MD;  Location: [place];  Service: Orthopedics;  Laterality: Right;  . TONSILLECTOMY  ~ 1945  . VASECTOMY      Current Outpatient Prescriptions  Medication Sig Dispense Refill  . acetaminophen (TYLENOL) 650 MG CR tablet Take 650 mg by mouth 2 (two) times daily.     Marland Kitchen ALPRAZolam (XANAX) 0.5 MG tablet Take 1 tablet (0.5 mg total) by mouth 2 (two) times daily as needed for anxiety (for sleep). 60 tablet 5  . aspirin 81 MG tablet Take 81 mg by mouth daily.      Marland Kitchen  atorvastatin (LIPITOR) 20 MG tablet Take 1 tablet (20 mg total) by mouth daily. 90 tablet 3  . carvedilol (COREG) 3.125 MG tablet Take 1 tablet (3.125 mg total) by mouth 2 (two) times daily with a meal. 180 tablet 3  . Cholecalciferol (VITAMIN D3) 1000 UNITS tablet Take 1,000 Units by mouth daily.     . Cyanocobalamin (VITAMIN B 12 PO) Take 500 mg by mouth daily.    Marland Kitchen losartan (COZAAR) 50 MG tablet Take 1 tablet (50 mg total) by mouth daily. 90 tablet 3  . Magnesium 250 MG TABS Take 250 mg by mouth daily.     . nitroGLYCERIN (NITROSTAT) 0.4 MG SL tablet Place 1 tablet (0.4 mg total) under the  tongue every 5 (five) minutes as needed for chest pain (for chest pain). 25 tablet 6  . Omega-3 Fatty Acids (FISH OIL) 1000 MG CAPS Take 1,000 mg by mouth 2 (two) times daily.     . Saw Palmetto 450 MG CAPS Take 450 mg by mouth 2 (two) times daily.      No current facility-administered medications for this visit.     Allergies as of 07/24/2016 - Review Complete 07/24/2016  Allergen Reaction Noted  . Fluoxetine hcl Other (See Comments) 03/21/2011  . Other Other (See Comments) 03/24/2012  . Prozac [fluoxetine hcl] Other (See Comments) 03/21/2011  . Oxycodone Other (See Comments) 02/07/2016    Family History  Problem Relation Age of Onset  . Alzheimer's disease Mother   . Lymphoma Father   . Coronary artery disease Brother   . COPD Brother   . Diabetes Brother   . Colon cancer Paternal Uncle   . Diabetes Brother     Social History   Social History  . Marital status: Married    Spouse name: N/A  . Number of children: 2  . Years of education: N/A   Occupational History  . Retired    Social History Main Topics  . Smoking status: Never Smoker  . Smokeless tobacco: Never Used  . Alcohol use No  . Drug use: No  . Sexual activity: Not Currently   Other Topics Concern  . Not on file   Social History Narrative   No caffeine      Physical Exam: BP 122/70   Pulse 68   Ht 5\' 5"  (1.651 m)   Wt 141 lb 6.4 oz (64.1 kg)   BMI 23.53 kg/m  Constitutional: generally well-appearing Psychiatric: alert and oriented x3 Eyes: extraocular movements intact Mouth: oral pharynx moist, no lesions Neck: supple no lymphadenopathy Cardiovascular: heart regular rate and rhythm Lungs: clear to auscultation bilaterally Abdomen: soft, nontender, nondistended, no obvious ascites, no peritoneal signs, normal bowel sounds Extremities: no lower extremity edema bilaterally Skin: no lesions on visible extremities   Assessment and plan: 76 y.o. male with  Personal history of adenomatous  colon polyps  We had nice discussion about the nature of colon cancer screening, polyp surveillance examinations. He understands that examinations such as disease generally stops between the ages of 100 and 27. He is in very good health overall and I think colon polyp surveillance is still a relevant issue for him.  Greater than 50% of this visit was spent in direct face-to-face counseling.  Total time of this visit was 30 min.   Please see the "Patient Instructions" section for addition details about the plan.   Owens Loffler, MD [facility] 07/24/2016, 2:42 PM  Cc: Eulas Post, MD

## 2016-07-24 NOTE — Patient Instructions (Addendum)
You will be set up for a colonoscopy for polyp surveillance

## 2016-07-27 DIAGNOSIS — C84A0 Cutaneous T-cell lymphoma, unspecified, unspecified site: Secondary | ICD-10-CM | POA: Diagnosis not present

## 2016-07-27 DIAGNOSIS — L821 Other seborrheic keratosis: Secondary | ICD-10-CM | POA: Diagnosis not present

## 2016-07-27 DIAGNOSIS — L570 Actinic keratosis: Secondary | ICD-10-CM | POA: Diagnosis not present

## 2016-07-27 DIAGNOSIS — Z85828 Personal history of other malignant neoplasm of skin: Secondary | ICD-10-CM | POA: Diagnosis not present

## 2016-07-27 DIAGNOSIS — D2271 Melanocytic nevi of right lower limb, including hip: Secondary | ICD-10-CM | POA: Diagnosis not present

## 2016-07-27 DIAGNOSIS — Z23 Encounter for immunization: Secondary | ICD-10-CM | POA: Diagnosis not present

## 2016-08-03 DIAGNOSIS — M545 Low back pain: Secondary | ICD-10-CM | POA: Diagnosis not present

## 2016-08-03 DIAGNOSIS — M47817 Spondylosis without myelopathy or radiculopathy, lumbosacral region: Secondary | ICD-10-CM | POA: Diagnosis not present

## 2016-08-09 ENCOUNTER — Telehealth: Payer: Self-pay | Admitting: Family Medicine

## 2016-08-09 NOTE — Telephone Encounter (Signed)
APT. REMINDER CALL, LMTCB °

## 2016-08-13 ENCOUNTER — Other Ambulatory Visit (INDEPENDENT_AMBULATORY_CARE_PROVIDER_SITE_OTHER): Payer: Medicare Other

## 2016-08-13 ENCOUNTER — Other Ambulatory Visit: Payer: Self-pay | Admitting: Oncology

## 2016-08-13 DIAGNOSIS — D693 Immune thrombocytopenic purpura: Secondary | ICD-10-CM

## 2016-08-13 DIAGNOSIS — Z7901 Long term (current) use of anticoagulants: Secondary | ICD-10-CM | POA: Diagnosis present

## 2016-08-13 DIAGNOSIS — C8400 Mycosis fungoides, unspecified site: Secondary | ICD-10-CM

## 2016-08-13 DIAGNOSIS — D696 Thrombocytopenia, unspecified: Secondary | ICD-10-CM

## 2016-08-13 LAB — CBC WITH DIFFERENTIAL/PLATELET
BASOS ABS: 0 10*3/uL (ref 0.0–0.1)
Basophils Relative: 1 %
EOS ABS: 0.2 10*3/uL (ref 0.0–0.7)
Eosinophils Relative: 4 %
HCT: 47 % (ref 39.0–52.0)
Hemoglobin: 15.2 g/dL (ref 13.0–17.0)
Lymphocytes Relative: 32 %
Lymphs Abs: 1.3 10*3/uL (ref 0.7–4.0)
MCH: 33 pg (ref 26.0–34.0)
MCHC: 32.3 g/dL (ref 30.0–36.0)
MCV: 102 fL — ABNORMAL HIGH (ref 78.0–100.0)
MONO ABS: 0.4 10*3/uL (ref 0.1–1.0)
Monocytes Relative: 9 %
Neutro Abs: 2.2 10*3/uL (ref 1.7–7.7)
Neutrophils Relative %: 55 %
PLATELETS: UNDETERMINED 10*3/uL (ref 150–400)
RBC: 4.61 MIL/uL (ref 4.22–5.81)
RDW: 12.8 % (ref 11.5–15.5)
WBC: 4 10*3/uL (ref 4.0–10.5)

## 2016-08-14 ENCOUNTER — Telehealth: Payer: Self-pay | Admitting: *Deleted

## 2016-08-14 NOTE — Telephone Encounter (Signed)
Pt called / informed both tubes platelets clumped per Dr Beryle Beams. Stated he's in [place] - will come back Monday the 9th @ 1330 PM.

## 2016-08-14 NOTE — Telephone Encounter (Signed)
-----   Message from Annia Belt, MD sent at 08/13/2016  5:23 PM EDT ----- Call pt: platelets clumped so machine could not get an accurate count.  If he is in [place] he can repeat CBC here - we will use a different test tube to collect the blood so the platelets don't clump. I will put in order. Linus Orn - this is another one that will need to be repeated in a citrate tube. drG

## 2016-08-16 ENCOUNTER — Ambulatory Visit: Payer: Medicare Other

## 2016-08-17 ENCOUNTER — Encounter: Payer: Self-pay | Admitting: Family Medicine

## 2016-08-17 ENCOUNTER — Ambulatory Visit (INDEPENDENT_AMBULATORY_CARE_PROVIDER_SITE_OTHER): Payer: Medicare Other | Admitting: Family Medicine

## 2016-08-17 VITALS — BP 110/80 | HR 52 | Temp 98.3°F | Ht 65.5 in | Wt 139.5 lb

## 2016-08-17 DIAGNOSIS — I2510 Atherosclerotic heart disease of native coronary artery without angina pectoris: Secondary | ICD-10-CM

## 2016-08-17 DIAGNOSIS — Z0000 Encounter for general adult medical examination without abnormal findings: Secondary | ICD-10-CM

## 2016-08-17 DIAGNOSIS — Z125 Encounter for screening for malignant neoplasm of prostate: Secondary | ICD-10-CM

## 2016-08-17 DIAGNOSIS — I10 Essential (primary) hypertension: Secondary | ICD-10-CM

## 2016-08-17 DIAGNOSIS — F5104 Psychophysiologic insomnia: Secondary | ICD-10-CM | POA: Diagnosis not present

## 2016-08-17 DIAGNOSIS — D696 Thrombocytopenia, unspecified: Secondary | ICD-10-CM

## 2016-08-17 LAB — COMPREHENSIVE METABOLIC PANEL
ALK PHOS: 71 U/L (ref 39–117)
ALT: 18 U/L (ref 0–53)
AST: 19 U/L (ref 0–37)
Albumin: 4.1 g/dL (ref 3.5–5.2)
BUN: 18 mg/dL (ref 6–23)
CHLORIDE: 104 meq/L (ref 96–112)
CO2: 32 meq/L (ref 19–32)
Calcium: 9.2 mg/dL (ref 8.4–10.5)
Creatinine, Ser: 0.83 mg/dL (ref 0.40–1.50)
GFR: 95.27 mL/min (ref >60.00)
GLUCOSE: 88 mg/dL (ref 70–99)
POTASSIUM: 4.2 meq/L (ref 3.5–5.1)
SODIUM: 140 meq/L (ref 135–145)
Total Bilirubin: 1.4 mg/dL — ABNORMAL HIGH (ref 0.2–1.2)
Total Protein: 5.9 g/dL — ABNORMAL LOW (ref 6.0–8.3)

## 2016-08-17 LAB — LIPID PANEL
CHOL/HDL RATIO: 2
Cholesterol: 108 mg/dL (ref 0–200)
HDL: 55.5 mg/dL (ref >39.00)
LDL CALC: 40 mg/dL (ref 0–99)
NONHDL: 52.93
Triglycerides: 67 mg/dL (ref 0.0–149.0)
VLDL: 13.4 mg/dL (ref 0.0–40.0)

## 2016-08-17 LAB — PSA: PSA: 1.27 ng/mL (ref 0.10–4.00)

## 2016-08-17 MED ORDER — ALPRAZOLAM 0.5 MG PO TABS: 0.5000 mg | ORAL_TABLET | Freq: Two times a day (BID) | ORAL | 5 refills | Status: DC | PRN

## 2016-08-17 NOTE — Patient Instructions (Signed)
Health Maintenance  Topic Date Due  . PNA vac Low Risk Adult (2 of 2 - PPSV23) 08/18/2026 (Originally 08/11/2014)  . COLONOSCOPY  09/03/2016  . INFLUENZA VACCINE  12/12/2016  . TETANUS/TDAP  06/14/2019

## 2016-08-17 NOTE — Progress Notes (Signed)
Subjective:     Patient ID: [name], male   DOB: 1940-05-30, 76 y.o.   MRN: [id number]  HPI Patient seen for Medicare wellness visit and medical follow-up. He has history of CAD, chronic thrombocytopenia, remote history of cutaneous T-cell lymphoma, BPH. He had sleep study last year which revealed possible restless leg syndrome. Only very mild obstructive apnea. He's not consciously aware of any restless leg symptoms. He was prescribed Requip but read up on possible side effects and never took this. Does not drink any caffeine. No recent anemia by labs. He had right total knee replacement last year and recovered fairly well from that. He is due for repeat colonoscopy soon. Immunizations up-to-date.  He has for years taken low-dose alprazolam at night. We've discussed potential issues with chronic use including cognitive impairment and increased risk of falls.  Past Medical History:  Diagnosis Date  . Allergy   . Anxiety   . Arthritis    "all over" (10/25/2012)  . Basal cell carcinoma of head    "right side; top of head" (10-25-2012)  . Benign prostatic hypertrophy   . CAD (coronary artery disease)   . Chronic anticoagulation 02/20/2016  . Chronic lower back pain    "from a herniated disc" (25-Oct-2012)  . Depression   . Diverticulosis of colon   . Dysrhythmia   . Factor VII deficiency ([place])    10/28/15: No history of factor VII deficiency per Dr. Beryle Beams  . History of blood transfusion 1941  . History of colon polyps   . Hyperlipidemia   . ITP (idiopathic thrombocytopenic purpura)   . Kidney stones    "I've had them twice; last time was ~ 9 yr ago; both times passed on their own" (October 25, 2012)  . Myocardial infarction 2009  . Pneumonia    "I think I had as a child" (25-Oct-2012)  . Squamous carcinoma    "left side; top of head" (10/25/12)  . T-cell lymphoma ([place])    "I'm in stage I; original site was on my left chest; had a place around my waist treated last summer; got a place on  back of my left leg" (2012/10/25)   Past Surgical History:  Procedure Laterality Date  . CARDIAC CATHETERIZATION  Oct 25, 2012  . CATARACT EXTRACTION W/ INTRAOCULAR LENS  IMPLANT, BILATERAL Bilateral 2000's  . COLONOSCOPY    . CORONARY ANGIOPLASTY WITH STENT PLACEMENT  05/29/2007   "1" (Oct 25, 2012)  . CYST EXCISION  1980's?   "or fatty tumor; taken off my back" (2012-10-25)  . KNEE SURGERY     right  . LEFT HEART CATHETERIZATION WITH CORONARY ANGIOGRAM N/A October 25, 2012   Procedure: LEFT HEART CATHETERIZATION WITH CORONARY ANGIOGRAM;  Surgeon: Burnell Blanks, MD;  Location: [facility] CATH LAB;  Service: Cardiovascular;  Laterality: N/A;  . LUMBAR EPIDURAL INJECTION  ~ 2004  . MOHS SURGERY Bilateral 2011-2012   "top of my head" (October 25, 2012)  . PARTIAL KNEE ARTHROPLASTY Right 11/08/2015   Procedure: RIGHT UNICOMPARTMENTAL KNEE;  Surgeon: Renette Butters, MD;  Location: [facility];  Service: Orthopedics;  Laterality: Right;  . TONSILLECTOMY  ~ 1945  . VASECTOMY      reports that he has never smoked. He has never used smokeless tobacco. He reports that he does not drink alcohol or use drugs. family history includes Alzheimer's disease in his mother; COPD in his brother; Colon cancer in his paternal uncle; Coronary artery disease in his brother; Diabetes in his brother and brother; Lymphoma in his father. Allergies  Allergen Reactions  . Fluoxetine Hcl Other (See Comments)    nightmares  . Other Other (See Comments)    ANTI-DEPRESSANTS. ANTI-DEPRESSANTS Antidepressants...has a variety of reactions to different antidepressants  . Prozac [Fluoxetine Hcl] Other (See Comments)    nightmares  . Oxycodone Other (See Comments)    unknown   .1.  Risk factors based on Past Medical , Social, and Family history reviewed and as indicated above with no changes 2.  Limitations in physical activities None.  No recent falls. 3.  Depression/mood No active depression or anxiety issues 4.  Hearing has bilateral  hearing deficits and has hearing aids 5.  ADLs independent in all. 6.  Cognitive function (orientation to time and place, language, writing, speech,memory) no short or long term memory issues.  Language and judgement intact. 7.  Home Safety no issues 8.  Height, weight, and visual acuity.all stable. 9.  Counseling discussed -insomnia (sleep hygiene).  Risks of benzo use in elderly. 10. Recommendation of preventive services.  None indicated at this time. 11. Labs based on risk factors-lipid, hepatic, BMP, pt requesting PSA. 12. Care Plan- as above 13. Other Providers Dr Ardis Hughs GI, Dr Angelena Form cardiology, Dr Beryle Beams hematology. 14. Written schedule of screening/prevention services given to patient.    Review of Systems  Constitutional: Negative for activity change, appetite change, fatigue and fever.  HENT: Negative for congestion, ear pain and trouble swallowing.   Eyes: Negative for pain and visual disturbance.  Respiratory: Negative for cough, shortness of breath and wheezing.   Cardiovascular: Negative for chest pain and palpitations.  Gastrointestinal: Negative for abdominal distention, abdominal pain, blood in stool, constipation, diarrhea, nausea, rectal pain and vomiting.  Genitourinary: Negative for dysuria, hematuria and testicular pain.  Musculoskeletal: Negative for arthralgias and joint swelling.  Skin: Negative for rash.  Neurological: Negative for dizziness, syncope and headaches.  Hematological: Negative for adenopathy.  Psychiatric/Behavioral: Negative for confusion and dysphoric mood.       Objective:   Physical Exam  Constitutional: He is oriented to person, place, and time. He appears well-developed and well-nourished. No distress.  HENT:  Head: Normocephalic and atraumatic.  Right Ear: External ear normal.  Left Ear: External ear normal.  Mouth/Throat: Oropharynx is clear and moist.  Eyes: Conjunctivae and EOM are normal. Pupils are equal, round, and  reactive to light.  Neck: Normal range of motion. Neck supple. No thyromegaly present.  Cardiovascular: Normal rate, regular rhythm and normal heart sounds.   No murmur heard. Pulmonary/Chest: No respiratory distress. He has no wheezes. He has no rales.  Abdominal: Soft. Bowel sounds are normal. He exhibits no distension and no mass. There is no tenderness. There is no rebound and no guarding.  Musculoskeletal: He exhibits no edema.  Lymphadenopathy:    He has no cervical adenopathy.  Neurological: He is alert and oriented to person, place, and time. He displays normal reflexes. No cranial nerve deficit.  Skin: No rash noted.  Psychiatric: He has a normal mood and affect.       Assessment:     #1 history of CAD. Followed by cardiology. No recent chest pains. Needs follow-up lipid panel  #2 history of chronic insomnia  #3 chronic thrombocytopenia  #4 history of BPH symptomatically stable  #5 probable restless leg syndrome  #6 history of cutaneous T-cell lymphoma. Followed by dermatology yearly    Plan:     -Already scheduled for repeat colonoscopy -Obtain lipid and comprehensive metabolic panel -The natural history of prostate cancer and  ongoing controversy regarding screening and potential treatment outcomes of prostate cancer has been discussed with the patient. The meaning of a false positive PSA and a false negative PSA has been discussed. He indicates understanding of the limitations of this screening test and wishes to proceed with screening PSA testing. -Reminder for yearly flu vaccination  Eulas Post MD [facility]

## 2016-08-17 NOTE — Progress Notes (Signed)
Pre visit review using our clinic review tool, if applicable. No additional management support is needed unless otherwise documented below in the visit note. 

## 2016-08-18 ENCOUNTER — Encounter: Payer: Self-pay | Admitting: Family Medicine

## 2016-08-20 ENCOUNTER — Other Ambulatory Visit (INDEPENDENT_AMBULATORY_CARE_PROVIDER_SITE_OTHER): Payer: Medicare Other

## 2016-08-20 DIAGNOSIS — D696 Thrombocytopenia, unspecified: Secondary | ICD-10-CM | POA: Diagnosis present

## 2016-08-20 LAB — CBC WITH DIFFERENTIAL/PLATELET
BASOS ABS: 0 10*3/uL (ref 0.0–0.1)
Basophils Relative: 0 %
Eosinophils Absolute: 0.1 10*3/uL (ref 0.0–0.7)
Eosinophils Relative: 3 %
HEMATOCRIT: 44.8 % (ref 39.0–52.0)
Hemoglobin: 14.7 g/dL (ref 13.0–17.0)
LYMPHS ABS: 1.4 10*3/uL (ref 0.7–4.0)
LYMPHS PCT: 27 %
MCH: 33 pg (ref 26.0–34.0)
MCHC: 32.8 g/dL (ref 30.0–36.0)
MCV: 100.7 fL — AB (ref 78.0–100.0)
MONO ABS: 0.3 10*3/uL (ref 0.1–1.0)
Monocytes Relative: 6 %
NEUTROS ABS: 3.1 10*3/uL (ref 1.7–7.7)
NEUTROS PCT: 63 %
Platelets: 100 10*3/uL — ABNORMAL LOW (ref 150–400)
RBC: 4.45 MIL/uL (ref 4.22–5.81)
RDW: 12.7 % (ref 11.5–15.5)
WBC: 5 10*3/uL (ref 4.0–10.5)

## 2016-08-21 DIAGNOSIS — M545 Low back pain: Secondary | ICD-10-CM | POA: Diagnosis not present

## 2016-08-21 DIAGNOSIS — M47817 Spondylosis without myelopathy or radiculopathy, lumbosacral region: Secondary | ICD-10-CM | POA: Diagnosis not present

## 2016-08-21 DIAGNOSIS — M48061 Spinal stenosis, lumbar region without neurogenic claudication: Secondary | ICD-10-CM | POA: Diagnosis not present

## 2016-08-22 ENCOUNTER — Telehealth: Payer: Self-pay | Admitting: *Deleted

## 2016-08-22 NOTE — Telephone Encounter (Signed)
Called pt - not at home; informed wife "repeat platelet count 100,000 which is his baseline" per Dr Beryle Beams. She stated he had already seen results.

## 2016-08-22 NOTE — Telephone Encounter (Signed)
-----   Message from Annia Belt, MD sent at 08/20/2016  3:47 PM EDT ----- Call pt: repeat platelet count 100,000 which is his baseline

## 2016-09-07 ENCOUNTER — Encounter: Payer: Self-pay | Admitting: Gastroenterology

## 2016-09-07 ENCOUNTER — Ambulatory Visit: Payer: Medicare Other | Admitting: Gastroenterology

## 2016-09-07 ENCOUNTER — Ambulatory Visit (AMBULATORY_SURGERY_CENTER): Payer: Medicare Other | Admitting: Gastroenterology

## 2016-09-07 VITALS — BP 118/59 | HR 62 | Temp 97.5°F | Resp 14 | Ht 65.0 in | Wt 141.0 lb

## 2016-09-07 DIAGNOSIS — K5730 Diverticulosis of large intestine without perforation or abscess without bleeding: Secondary | ICD-10-CM

## 2016-09-07 DIAGNOSIS — D123 Benign neoplasm of transverse colon: Secondary | ICD-10-CM

## 2016-09-07 DIAGNOSIS — D124 Benign neoplasm of descending colon: Secondary | ICD-10-CM

## 2016-09-07 DIAGNOSIS — Z86010 Personal history of colonic polyps: Secondary | ICD-10-CM | POA: Diagnosis not present

## 2016-09-07 MED ORDER — SODIUM CHLORIDE 0.9 % IV SOLN: 500.0000 mL | INTRAVENOUS | Status: DC

## 2016-09-07 NOTE — Patient Instructions (Signed)
YOU HAD AN ENDOSCOPIC PROCEDURE TODAY AT THE [facility]:   Refer to the procedure report that was given to you for any specific questions about what was found during the examination.  If the procedure report does not answer your questions, please call your gastroenterologist to clarify.  If you requested that your care partner not be given the details of your procedure findings, then the procedure report has been included in a sealed envelope for you to review at your convenience later.  YOU SHOULD EXPECT: Some feelings of bloating in the abdomen. Passage of more gas than usual.  Walking can help get rid of the air that was put into your GI tract during the procedure and reduce the bloating. If you had a lower endoscopy (such as a colonoscopy or flexible sigmoidoscopy) you may notice spotting of blood in your stool or on the toilet paper. If you underwent a bowel prep for your procedure, you may not have a normal bowel movement for a few days.  Please Note:  You might notice some irritation and congestion in your nose or some drainage.  This is from the oxygen used during your procedure.  There is no need for concern and it should clear up in a day or so.  SYMPTOMS TO REPORT IMMEDIATELY:   Following lower endoscopy (colonoscopy or flexible sigmoidoscopy):  Excessive amounts of blood in the stool  Significant tenderness or worsening of abdominal pains  Swelling of the abdomen that is new, acute  Fever of 100F or higher   For urgent or emergent issues, a gastroenterologist can be reached at any hour by calling [phone]. Please read all handouts given to you by your recovery nurse.   DIET:  We do recommend a small meal at first, but then you may proceed to your regular diet.  Drink plenty of fluids but you should avoid alcoholic beverages for 24 hours.  ACTIVITY:  You should plan to take it easy for the rest of today and you should NOT DRIVE or use heavy machinery until  tomorrow (because of the sedation medicines used during the test).    FOLLOW UP: Our staff will call the number listed on your records the next business day following your procedure to check on you and address any questions or concerns that you may have regarding the information given to you following your procedure. If we do not reach you, we will leave a message.  However, if you are feeling well and you are not experiencing any problems, there is no need to return our call.  We will assume that you have returned to your regular daily activities without incident.  If any biopsies were taken you will be contacted by phone or by letter within the next 1-3 weeks.  Please call us at [phone] if you have not heard about the biopsies in 3 weeks.    SIGNATURES/CONFIDENTIALITY: You and/or your care partner have signed paperwork which will be entered into your electronic medical record.  These signatures attest to the fact that that the information above on your After Visit Summary has been reviewed and is understood.  Full responsibility of the confidentiality of this discharge information lies with you and/or your care-partner.  Thank you for letting us take care of your healthcare needs today. 

## 2016-09-07 NOTE — Progress Notes (Signed)
To Pacu VSS. Report to RN 

## 2016-09-07 NOTE — Progress Notes (Signed)
Pt's states no medical or surgical changes since previsit or office visit. 

## 2016-09-07 NOTE — Op Note (Signed)
[facility] Patient Name: [name] Procedure Date: 09/07/2016 2:14 PM MRN: [id number] Endoscopist: Milus Banister , MD Age: 76 Referring MD:  Date of Birth: 01/02/1941 Gender: Male Account #: [id number] Procedure:                Colonoscopy Indications:              High risk colon cancer surveillance: Personal                            history of colonic polyps; Adenomatous colon                            polyps.Colonoscopy Dr. Ardis Hughs 2008 found two                            subcentimeter adenomas. Repeat colonoscopy Dr.                            Ardis Hughs 2013 found a single subcentimeter adenoma.                            Recommended 5 year recall. Medicines:                Monitored Anesthesia Care Procedure:                Pre-Anesthesia Assessment:                           - Prior to the procedure, a History and Physical                            was performed, and patient medications and                            allergies were reviewed. The patient's tolerance of                            previous anesthesia was also reviewed. The risks                            and benefits of the procedure and the sedation                            options and risks were discussed with the patient.                            All questions were answered, and informed consent                            was obtained. Prior Anticoagulants: The patient has                            taken no previous anticoagulant or antiplatelet  agents. ASA Grade Assessment: II - A patient with                            mild systemic disease. After reviewing the risks                            and benefits, the patient was deemed in                            satisfactory condition to undergo the procedure.                           After obtaining informed consent, the colonoscope                            was passed under direct vision. Throughout the                       procedure, the patient's blood pressure, pulse, and                            oxygen saturations were monitored continuously. The                            Colonoscope was introduced through the anus and                            advanced to the the cecum, identified by                            appendiceal orifice and ileocecal valve. The                            colonoscopy was performed without difficulty. The                            patient tolerated the procedure well. The quality                            of the bowel preparation was excellent. The                            ileocecal valve, appendiceal orifice, and rectum                            were photographed. Scope In: 2:19:16 PM Scope Out: 2:36:02 PM Scope Withdrawal Time: 0 hours 9 minutes 55 seconds  Total Procedure Duration: 0 hours 16 minutes 46 seconds  Findings:                 Two sessile polyps were found in the descending                            colon and transverse colon. The polyps were 3 to 5  mm in size. These polyps were removed with a cold                            snare. Resection and retrieval were complete.                           Many small and large-mouthed diverticula were found                            in the left colon.                           The exam was otherwise without abnormality on                            direct and retroflexion views. Complications:            No immediate complications. Estimated blood loss:                            None. Estimated Blood Loss:     Estimated blood loss: none. Impression:               - Two 3 to 5 mm polyps in the descending colon and                            in the transverse colon, removed with a cold snare.                            Resected and retrieved.                           - Diverticulosis in the left colon.                           - The examination was otherwise normal  on direct                            and retroflexion views. Recommendation:           - Patient has a contact number available for                            emergencies. The signs and symptoms of potential                            delayed complications were discussed with the                            patient. Return to normal activities tomorrow.                            Written discharge instructions were provided to the                            patient.                           -  Resume previous diet.                           - Continue present medications.                           - Await pathology results. Milus Banister, MD 09/07/2016 2:39:40 PM This report has been signed electronically.

## 2016-09-07 NOTE — Progress Notes (Signed)
Called to room to assist during endoscopic procedure.  Patient ID and intended procedure confirmed with present staff. Received instructions for my participation in the procedure from the performing physician.  

## 2016-09-10 ENCOUNTER — Telehealth: Payer: Self-pay | Admitting: *Deleted

## 2016-09-10 NOTE — Telephone Encounter (Signed)
  Follow up Call-  Call back number 09/07/2016  Post procedure Call Back phone  # [phone]  Permission to leave phone message Yes  Some recent data might be hidden     Patient questions:  Do you have a fever, pain , or abdominal swelling? No. Pain Score  0 *  Have you tolerated food without any problems? Yes.    Have you been able to return to your normal activities? Yes.    Do you have any questions about your discharge instructions: Diet   No. Medications  No. Follow up visit  No.  Do you have questions or concerns about your Care? No.  Actions: * If pain score is 4 or above: No action needed, pain <4.

## 2016-09-11 DIAGNOSIS — H40013 Open angle with borderline findings, low risk, bilateral: Secondary | ICD-10-CM | POA: Diagnosis not present

## 2016-09-13 DIAGNOSIS — M47817 Spondylosis without myelopathy or radiculopathy, lumbosacral region: Secondary | ICD-10-CM | POA: Diagnosis not present

## 2016-09-13 DIAGNOSIS — M48061 Spinal stenosis, lumbar region without neurogenic claudication: Secondary | ICD-10-CM | POA: Diagnosis not present

## 2016-09-13 DIAGNOSIS — M545 Low back pain: Secondary | ICD-10-CM | POA: Diagnosis not present

## 2016-09-17 ENCOUNTER — Encounter: Payer: Self-pay | Admitting: Gastroenterology

## 2016-09-27 DIAGNOSIS — M545 Low back pain: Secondary | ICD-10-CM | POA: Diagnosis not present

## 2016-09-27 DIAGNOSIS — M47817 Spondylosis without myelopathy or radiculopathy, lumbosacral region: Secondary | ICD-10-CM | POA: Diagnosis not present

## 2016-09-27 DIAGNOSIS — M48061 Spinal stenosis, lumbar region without neurogenic claudication: Secondary | ICD-10-CM | POA: Diagnosis not present

## 2016-11-30 ENCOUNTER — Ambulatory Visit (INDEPENDENT_AMBULATORY_CARE_PROVIDER_SITE_OTHER): Payer: Medicare Other | Admitting: Family Medicine

## 2016-11-30 ENCOUNTER — Encounter: Payer: Self-pay | Admitting: Family Medicine

## 2016-11-30 VITALS — BP 110/70 | HR 60 | Temp 97.6°F | Wt 137.1 lb

## 2016-11-30 DIAGNOSIS — I2510 Atherosclerotic heart disease of native coronary artery without angina pectoris: Secondary | ICD-10-CM | POA: Diagnosis not present

## 2016-11-30 DIAGNOSIS — F439 Reaction to severe stress, unspecified: Secondary | ICD-10-CM | POA: Diagnosis not present

## 2016-11-30 DIAGNOSIS — R5383 Other fatigue: Secondary | ICD-10-CM

## 2016-11-30 NOTE — Progress Notes (Signed)
Subjective:     Patient ID: [name], male   DOB: 07-09-1940, 76 y.o.   MRN: [id number]  HPI Patient seen with chief complaint of increased fatigue. His wife had a fall May 14 with complicated humerus fracture. Patient has been basically helping to care for her 24/7 and getting up frequently at night with her. Usually sleeps from 11:30 to 8:00 and very disrupted sleep and he thinks this is contributing. He also feels emotionally exhausted. He had chemistries and CBC back in April which were normal. Appetite stable. Weight is down 2 pounds. He denies any chest pains. No dyspnea.  Part of his frustration is having a daughter who lives in town who is not helping with caring for her mother.  Past Medical History:  Diagnosis Date  . Allergy   . Anxiety   . Arthritis    "all over" (10-22-12)  . Basal cell carcinoma of head    "right side; top of head" (October 22, 2012)  . Benign prostatic hypertrophy   . CAD (coronary artery disease)   . Chronic anticoagulation 02/20/2016  . Chronic lower back pain    "from a herniated disc" (10-22-2012)  . Depression   . Diverticulosis of colon   . Dysrhythmia   . Factor VII deficiency ([place])    10/28/15: No history of factor VII deficiency per Dr. Beryle Beams  . History of blood transfusion 1941  . History of colon polyps   . Hyperlipidemia   . ITP (idiopathic thrombocytopenic purpura)   . Kidney stones    "I've had them twice; last time was ~ 9 yr ago; both times passed on their own" (Oct 22, 2012)  . Myocardial infarction ([place]) 2009  . Pneumonia    "I think I had as a child" (10-22-2012)  . Squamous carcinoma    "left side; top of head" (10/22/12)  . T-cell lymphoma ([place])    "I'm in stage I; original site was on my left chest; had a place around my waist treated last summer; got a place on back of my left leg" (10-22-2012)   Past Surgical History:  Procedure Laterality Date  . CARDIAC CATHETERIZATION  10-22-2012  . CATARACT EXTRACTION W/ INTRAOCULAR LENS   IMPLANT, BILATERAL Bilateral 2000's  . COLONOSCOPY    . CORONARY ANGIOPLASTY WITH STENT PLACEMENT  05/29/2007   "1" (10-22-2012)  . CYST EXCISION  1980's?   "or fatty tumor; taken off my back" (2012-10-22)  . KNEE SURGERY     right  . LEFT HEART CATHETERIZATION WITH CORONARY ANGIOGRAM N/A 22-Oct-2012   Procedure: LEFT HEART CATHETERIZATION WITH CORONARY ANGIOGRAM;  Surgeon: Burnell Blanks, MD;  Location: [facility] CATH LAB;  Service: Cardiovascular;  Laterality: N/A;  . LUMBAR EPIDURAL INJECTION  ~ 2004  . MOHS SURGERY Bilateral 2011-2012   "top of my head" (10/22/12)  . PARTIAL KNEE ARTHROPLASTY Right 11/08/2015   Procedure: RIGHT UNICOMPARTMENTAL KNEE;  Surgeon: Renette Butters, MD;  Location: [facility];  Service: Orthopedics;  Laterality: Right;  . TONSILLECTOMY  ~ 1945  . VASECTOMY      reports that he has never smoked. He has never used smokeless tobacco. He reports that he does not drink alcohol or use drugs. family history includes Alzheimer's disease in his mother; COPD in his brother; Colon cancer in his paternal uncle; Coronary artery disease in his brother; Diabetes in his brother and brother; Lymphoma in his father. Allergies  Allergen Reactions  . Fluoxetine Hcl Other (See Comments)    nightmares  . Other  Other (See Comments)    ANTI-DEPRESSANTS. ANTI-DEPRESSANTS Antidepressants...has a variety of reactions to different antidepressants  . Prozac [Fluoxetine Hcl] Other (See Comments)    nightmares  . Oxycodone Other (See Comments)    unknown     Review of Systems  Constitutional: Positive for fatigue.  Eyes: Negative for visual disturbance.  Respiratory: Negative for cough, chest tightness and shortness of breath.   Cardiovascular: Negative for chest pain, palpitations and leg swelling.  Neurological: Negative for dizziness, syncope, weakness, light-headedness and headaches.       Objective:   Physical Exam  Constitutional: He appears well-developed and  well-nourished.  Neck: Neck supple. No thyromegaly present.  Cardiovascular: Normal rate and regular rhythm.   Pulmonary/Chest: Effort normal and breath sounds normal. No respiratory distress. He has no wheezes. He has no rales.  Musculoskeletal: He exhibits no edema.  Lymphadenopathy:    He has no cervical adenopathy.       Assessment:     Fatigue. Suspect largely related to situational stress and disrupted sleep    Plan:     -they are currently in process of getting additional help at home for his wife -We gave him names of counselors as he had some interest in pursuing counseling to help with his stress issues  Eulas Post MD [facility]

## 2016-11-30 NOTE — Patient Instructions (Signed)

## 2016-12-27 ENCOUNTER — Other Ambulatory Visit: Payer: Self-pay | Admitting: Cardiovascular Disease

## 2016-12-27 DIAGNOSIS — E7800 Pure hypercholesterolemia, unspecified: Secondary | ICD-10-CM

## 2016-12-27 DIAGNOSIS — I2510 Atherosclerotic heart disease of native coronary artery without angina pectoris: Secondary | ICD-10-CM

## 2017-01-05 ENCOUNTER — Encounter: Payer: Self-pay | Admitting: Family Medicine

## 2017-01-05 ENCOUNTER — Ambulatory Visit (INDEPENDENT_AMBULATORY_CARE_PROVIDER_SITE_OTHER): Payer: Medicare Other | Admitting: Family Medicine

## 2017-01-05 VITALS — BP 114/68 | HR 58 | Temp 97.8°F | Wt 138.1 lb

## 2017-01-05 DIAGNOSIS — R5383 Other fatigue: Secondary | ICD-10-CM

## 2017-01-05 DIAGNOSIS — I2510 Atherosclerotic heart disease of native coronary artery without angina pectoris: Secondary | ICD-10-CM

## 2017-01-05 DIAGNOSIS — J069 Acute upper respiratory infection, unspecified: Secondary | ICD-10-CM

## 2017-01-05 MED ORDER — DOXYCYCLINE HYCLATE 100 MG PO TABS: 100.0000 mg | ORAL_TABLET | Freq: Two times a day (BID) | ORAL | 0 refills | Status: DC

## 2017-01-06 NOTE — Progress Notes (Signed)
[name] is a 76 y.o. male here for an acute visit.  History of Present Illness:   Cough  This is a new problem. The current episode started in the past 7 days. The cough is non-productive. Associated symptoms include myalgias, nasal congestion, rhinorrhea and a sore throat. Pertinent negatives include no chest pain, chills, fever, headaches, rash, shortness of breath, weight loss or wheezing. The symptoms are aggravated by lying down. He has tried rest for the symptoms.   PMHx, SurgHx, SocialHx, Medications, and Allergies were reviewed in the Visit Navigator and updated as appropriate.  Current Medications:   .  acetaminophen (TYLENOL) 650 MG CR tablet, Take 650 mg by mouth 2 (two) times daily. , Disp: , Rfl:  .  ALPRAZolam (XANAX) 0.5 MG tablet, Take 1 tablet (0.5 mg total) by mouth 2 (two) times daily as needed for anxiety (for sleep)., Disp: 60 tablet, Rfl: 5 .  aspirin 81 MG tablet, Take 81 mg by mouth daily.  , Disp: , Rfl:  .  atorvastatin (LIPITOR) 20 MG tablet, TAKE 1 TABLET DAILY, Disp: 90 tablet, Rfl: 1 .  carvedilol (COREG) 3.125 MG tablet, TAKE 1 TABLET TWICE A DAY  WITH MEALS, Disp: 180 tablet, Rfl: 1 .  Cholecalciferol (VITAMIN D3) 1000 UNITS tablet, Take 1,000 Units by mouth daily. , Disp: , Rfl:  .  Cyanocobalamin (VITAMIN B 12 PO), Take 500 mg by mouth daily., Disp: , Rfl:  .  losartan (COZAAR) 50 MG tablet, TAKE 1 TABLET DAILY, Disp: 90 tablet, Rfl: 1 .  Magnesium 250 MG TABS, Take 250 mg by mouth daily. , Disp: , Rfl:  .  nitroGLYCERIN (NITROSTAT) 0.4 MG SL tablet, Place 1 tablet (0.4 mg total) under the tongue every 5 (five) minutes as needed for chest pain (for chest pain)., Disp: 25 tablet, Rfl: 6 .  Omega-3 Fatty Acids (FISH OIL) 1000 MG CAPS, Take 1,000 mg by mouth 2 (two) times daily. , Disp: , Rfl:  .  Saw Palmetto 450 MG CAPS, Take 450 mg by mouth 2 (two) times daily. , Disp: , Rfl:   Allergies  Allergen Reactions  . Fluoxetine Hcl Other (See Comments)   nightmares  . Other Other (See Comments)    ANTI-DEPRESSANTS. ANTI-DEPRESSANTS Antidepressants...has a variety of reactions to different antidepressants  . Prozac [Fluoxetine Hcl] Other (See Comments)    nightmares  . Oxycodone Other (See Comments)    unknown   Review of Systems:   Pertinent items are noted in the HPI. Otherwise, ROS is negative.  Vitals:   Vitals:   01/05/17 1125  BP: 114/68  Pulse: (!) 58  Temp: 97.8 F (36.6 C)  TempSrc: Oral  SpO2: 100%  Weight: 138 lb 1.3 oz (62.6 kg)     Body mass index is 22.98 kg/m.   Physical Exam:   Physical Exam  Constitutional: He is oriented to person, place, and time. He appears well-developed and well-nourished. No distress.  HENT:  Head: Normocephalic and atraumatic.  Right Ear: External ear normal.  Left Ear: External ear normal.  Nose: Rhinorrhea present.  Eyes: Pupils are equal, round, and reactive to light. Conjunctivae and EOM are normal.  Neck: Normal range of motion. Neck supple.  Cardiovascular: Normal rate, regular rhythm, normal heart sounds and intact distal pulses.   Pulmonary/Chest: Effort normal. No respiratory distress. He has no wheezes.  Abdominal: Soft. Bowel sounds are normal.  Musculoskeletal: Normal range of motion.  Neurological: He is alert and oriented to person, place,  and time.  Skin: Skin is warm and dry.  Psychiatric: He has a normal mood and affect. His behavior is normal. Judgment and thought content normal.  Nursing note and vitals reviewed.   Assessment and Plan:   [name] was seen today for sore throat.  Diagnoses and all orders for this visit:  Upper respiratory tract infection, unspecified type Comments: Likely viral today. High risk for complications. Safety net Rx provided today. Symptomatic care discussed. Orders: -     doxycycline (VIBRA-TABS) 100 MG tablet; Take 1 tablet (100 mg total) by mouth 2 (two) times daily.  Caregiver with fatigue Comments: We spent about 10  minutes discussing self care.   . Reviewed expectations re: course of current medical issues. . Discussed self-management of symptoms. . Outlined signs and symptoms indicating need for more acute intervention. . Patient verbalized understanding and all questions were answered. Marland Kitchen Health Maintenance issues including appropriate healthy diet, exercise, and smoking avoidance were discussed with patient. . See orders for this visit as documented in the electronic medical record. . Patient received an After Visit Summary.  Briscoe Deutscher, DO [place] [place] 01/06/2017  Future Appointments Date Time Provider [place]  03/19/2017 11:30 AM [facility] LAB [facility]  03/22/2017 2:40 PM Burnell Blanks, MD [facility]  03/26/2017 11:15 AM Beryle Beams, Alyson Locket, MD [facility]

## 2017-01-09 ENCOUNTER — Telehealth: Payer: Self-pay | Admitting: Family Medicine

## 2017-01-09 DIAGNOSIS — F4323 Adjustment disorder with mixed anxiety and depressed mood: Secondary | ICD-10-CM | POA: Diagnosis not present

## 2017-01-09 NOTE — Telephone Encounter (Signed)
That would be great.

## 2017-01-09 NOTE — Telephone Encounter (Signed)
Okay 

## 2017-01-09 NOTE — Telephone Encounter (Signed)
Patient would like to switch PCP to Dr. [name] due to location being closer to him. Wanted to get the OK from both providers.

## 2017-01-10 NOTE — Telephone Encounter (Signed)
Called patient on number [phone]  and left VM to schedule him and his wife for New Patient appointment.

## 2017-01-18 DIAGNOSIS — F4323 Adjustment disorder with mixed anxiety and depressed mood: Secondary | ICD-10-CM | POA: Diagnosis not present

## 2017-01-29 ENCOUNTER — Encounter: Payer: Self-pay | Admitting: Family Medicine

## 2017-01-29 ENCOUNTER — Ambulatory Visit (INDEPENDENT_AMBULATORY_CARE_PROVIDER_SITE_OTHER): Payer: Self-pay | Admitting: Family Medicine

## 2017-01-29 VITALS — BP 110/64 | HR 64 | Temp 97.6°F | Wt 137.4 lb

## 2017-01-29 DIAGNOSIS — Z7689 Persons encountering health services in other specified circumstances: Secondary | ICD-10-CM

## 2017-01-29 NOTE — Progress Notes (Signed)
[name] is a 76 y.o. male is here for follow up.  History of Present Illness:   [name] CMA acting as scribe for Dr. [name].  [id number] comes in today to establish care with Dr. [name]. He is accompanied by his wife today. He has some concerns about his wife today.   Health Maintenance Due  Topic Date Due  . INFLUENZA VACCINE  12/12/2016   Depression screen [facility] 2/9 08/17/2016 02/20/2016 09/05/2015  Decreased Interest 0 0 0  Down, Depressed, Hopeless 0 0 0  PHQ - 2 Score 0 0 0   PMHx, SurgHx, SocialHx, FamHx, Medications, and Allergies were reviewed in the Visit Navigator and updated as appropriate.   Patient Active Problem List   Diagnosis Date Noted  . Chronic anticoagulation 02/20/2016  . Osteoarthritis of right knee 11/08/2015  . Primary osteoarthritis of right knee 10/20/2015  . Bradycardia 11/02/2012  . SVT (supraventricular tachycardia) ([place]) 11/02/2012  . Chest pain at rest 10/03/2012  . Chronic insomnia 08/07/2012  . Chronic ITP (idiopathic thrombocytopenia) (H[facility]) 06/14/2011  . Mycosis fungoides, stage 1 ([place]) 06/14/2011  . Gallstone 06/14/2011  . History of kidney stones 06/14/2011  . CAD (coronary artery disease) 03/21/2011  . BEN HTN HEART DISEASE WITHOUT HEART FAIL 09/08/2009  . PERIODIC LIMB MOVEMENT DISORDER 09/17/2008  . Obstructive sleep apnea 09/14/2008  . CORONARY ATHEROSCLEROSIS NATIVE CORONARY ARTERY 09/08/2008  . Cardiomyopathy, ischemic 05/16/2008  . Thrombocytopenia ([place]) 02/26/2008  . Essential hypertension 07/10/2007  . Coronary atherosclerosis 07/10/2007  . URI 07/10/2007  . NEPHROLITHIASIS, HX OF 07/10/2007  . Hyperlipidemia 11/18/2006  . ALLERGIC RHINITIS 11/18/2006  . DIVERTICULOSIS, COLON 11/18/2006  . BPH (benign prostatic hypertrophy) 11/18/2006  . Osteoarthrosis, unspecified whether generalized or localized, unspecified site 11/18/2006  . RENAL COLIC 53/97/6734   Social History  Substance Use Topics  . Smoking status:  Never Smoker  . Smokeless tobacco: Never Used  . Alcohol use No   Current Medications and Allergies:   Current Outpatient Prescriptions:  .  acetaminophen (TYLENOL) 650 MG CR tablet, Take 650 mg by mouth 2 (two) times daily. , Disp: , Rfl:  .  ALPRAZolam (XANAX) 0.5 MG tablet, Take 1 tablet (0.5 mg total) by mouth 2 (two) times daily as needed for anxiety (for sleep)., Disp: 60 tablet, Rfl: 5 .  aspirin 81 MG tablet, Take 81 mg by mouth daily.  , Disp: , Rfl:  .  atorvastatin (LIPITOR) 20 MG tablet, TAKE 1 TABLET DAILY, Disp: 90 tablet, Rfl: 1 .  carvedilol (COREG) 3.125 MG tablet, TAKE 1 TABLET TWICE A DAY  WITH MEALS, Disp: 180 tablet, Rfl: 1 .  Cholecalciferol (VITAMIN D3) 1000 UNITS tablet, Take 1,000 Units by mouth daily. , Disp: , Rfl:  .  Cyanocobalamin (VITAMIN B 12 PO), Take 500 mg by mouth daily., Disp: , Rfl:  .  losartan (COZAAR) 50 MG tablet, TAKE 1 TABLET DAILY, Disp: 90 tablet, Rfl: 1 .  Magnesium 250 MG TABS, Take 250 mg by mouth daily. , Disp: , Rfl:  .  nitroGLYCERIN (NITROSTAT) 0.4 MG SL tablet, Place 1 tablet (0.4 mg total) under the tongue every 5 (five) minutes as needed for chest pain (for chest pain)., Disp: 25 tablet, Rfl: 6 .  Omega-3 Fatty Acids (FISH OIL) 1000 MG CAPS, Take 1,000 mg by mouth 2 (two) times daily. , Disp: , Rfl:  .  Saw Palmetto 450 MG CAPS, Take 450 mg by mouth 2 (two) times daily. , Disp: , Rfl:  Current Facility-Administered Medications:  .  0.9 %  sodium chloride infusion, 500 mL, Intravenous, Continuous, Milus Banister, MD  Allergies  Allergen Reactions  . Fluoxetine Hcl Other (See Comments)    nightmares  . Other Other (See Comments)    ANTI-DEPRESSANTS. ANTI-DEPRESSANTS Antidepressants...has a variety of reactions to different antidepressants  . Prozac [Fluoxetine Hcl] Other (See Comments)    nightmares  . Oxycodone Other (See Comments)    unknown   Review of Systems   Pertinent items are noted in the HPI. Otherwise, ROS is  negative.  Vitals:   Vitals:   01/29/17 1415  BP: 110/64  Pulse: 64  Temp: 97.6 F (36.4 C)  TempSrc: Oral  SpO2: 99%  Weight: 137 lb 6.4 oz (62.3 kg)     Body mass index is 22.86 kg/m.   Physical Exam:   Physical Exam NONE TODAY  Results for orders placed or performed in visit on 08/20/16  CBC with Differential/Platelet  Result Value Ref Range   WBC 5.0 4.0 - 10.5 K/uL   RBC 4.45 4.22 - 5.81 MIL/uL   Hemoglobin 14.7 13.0 - 17.0 g/dL   HCT 44.8 39.0 - 52.0 %   MCV 100.7 (H) 78.0 - 100.0 fL   MCH 33.0 26.0 - 34.0 pg   MCHC 32.8 30.0 - 36.0 g/dL   RDW 12.7 11.5 - 15.5 %   Platelets 100 (L) 150 - 400 K/uL   Neutrophils Relative % 63 %   Neutro Abs 3.1 1.7 - 7.7 K/uL   Lymphocytes Relative 27 %   Lymphs Abs 1.4 0.7 - 4.0 K/uL   Monocytes Relative 6 %   Monocytes Absolute 0.3 0.1 - 1.0 K/uL   Eosinophils Relative 3 %   Eosinophils Absolute 0.1 0.0 - 0.7 K/uL   Basophils Relative 0 %   Basophils Absolute 0.0 0.0 - 0.1 K/uL    Assessment and Plan:   WE DISCUSSED THE PATIENT'S WIFE DURING HIS ENTIRE VISIT TIME. WE WERE NEVER ABLE TO COMPLETE HIS ESTABLISHING VISIT BECAUSE OF THIS. NO CHARGE TODAY.

## 2017-01-31 ENCOUNTER — Encounter: Payer: Self-pay | Admitting: Family Medicine

## 2017-03-01 DIAGNOSIS — F4323 Adjustment disorder with mixed anxiety and depressed mood: Secondary | ICD-10-CM | POA: Diagnosis not present

## 2017-03-05 DIAGNOSIS — H35373 Puckering of macula, bilateral: Secondary | ICD-10-CM | POA: Diagnosis not present

## 2017-03-05 DIAGNOSIS — H40013 Open angle with borderline findings, low risk, bilateral: Secondary | ICD-10-CM | POA: Diagnosis not present

## 2017-03-05 DIAGNOSIS — H43813 Vitreous degeneration, bilateral: Secondary | ICD-10-CM | POA: Diagnosis not present

## 2017-03-05 DIAGNOSIS — H52203 Unspecified astigmatism, bilateral: Secondary | ICD-10-CM | POA: Diagnosis not present

## 2017-03-18 ENCOUNTER — Other Ambulatory Visit: Payer: Self-pay | Admitting: Oncology

## 2017-03-18 DIAGNOSIS — D693 Immune thrombocytopenic purpura: Secondary | ICD-10-CM

## 2017-03-18 DIAGNOSIS — C8400 Mycosis fungoides, unspecified site: Secondary | ICD-10-CM

## 2017-03-19 ENCOUNTER — Other Ambulatory Visit (INDEPENDENT_AMBULATORY_CARE_PROVIDER_SITE_OTHER): Payer: Medicare Other

## 2017-03-19 DIAGNOSIS — C8400 Mycosis fungoides, unspecified site: Secondary | ICD-10-CM | POA: Diagnosis not present

## 2017-03-19 DIAGNOSIS — D693 Immune thrombocytopenic purpura: Secondary | ICD-10-CM

## 2017-03-19 LAB — CBC WITH DIFFERENTIAL/PLATELET
BASOS PCT: 0 %
Basophils Absolute: 0 10*3/uL (ref 0.0–0.1)
EOS ABS: 0.1 10*3/uL (ref 0.0–0.7)
EOS PCT: 2 %
HCT: 44.1 % (ref 39.0–52.0)
Hemoglobin: 14.8 g/dL (ref 13.0–17.0)
Lymphocytes Relative: 23 %
Lymphs Abs: 1 10*3/uL (ref 0.7–4.0)
MCH: 33.8 pg (ref 26.0–34.0)
MCHC: 33.6 g/dL (ref 30.0–36.0)
MCV: 100.7 fL — ABNORMAL HIGH (ref 78.0–100.0)
MONOS PCT: 4 %
Monocytes Absolute: 0.2 10*3/uL (ref 0.1–1.0)
NEUTROS ABS: 2.9 10*3/uL (ref 1.7–7.7)
Neutrophils Relative %: 71 %
PLATELETS: 108 10*3/uL — AB (ref 150–400)
RBC: 4.38 MIL/uL (ref 4.22–5.81)
RDW: 13 % (ref 11.5–15.5)
WBC: 4.1 10*3/uL (ref 4.0–10.5)

## 2017-03-20 LAB — COMPREHENSIVE METABOLIC PANEL
A/G RATIO: 2.1 (ref 1.2–2.2)
ALT: 16 IU/L (ref 0–44)
AST: 22 IU/L (ref 0–40)
Albumin: 4 g/dL (ref 3.5–4.8)
Alkaline Phosphatase: 63 IU/L (ref 39–117)
BILIRUBIN TOTAL: 1.1 mg/dL (ref 0.0–1.2)
BUN/Creatinine Ratio: 22 (ref 10–24)
BUN: 18 mg/dL (ref 8–27)
CHLORIDE: 105 mmol/L (ref 96–106)
CO2: 25 mmol/L (ref 20–29)
Calcium: 9 mg/dL (ref 8.6–10.2)
Creatinine, Ser: 0.82 mg/dL (ref 0.76–1.27)
GFR calc non Af Amer: 85 mL/min/{1.73_m2} (ref >59)
GFR, EST AFRICAN AMERICAN: 98 mL/min/{1.73_m2} (ref >59)
GLOBULIN, TOTAL: 1.9 g/dL (ref 1.5–4.5)
Glucose: 127 mg/dL — ABNORMAL HIGH (ref 65–99)
POTASSIUM: 4.6 mmol/L (ref 3.5–5.2)
SODIUM: 143 mmol/L (ref 134–144)
TOTAL PROTEIN: 5.9 g/dL — AB (ref 6.0–8.5)

## 2017-03-20 LAB — LACTATE DEHYDROGENASE: LDH: 185 IU/L (ref 121–224)

## 2017-03-21 ENCOUNTER — Telehealth: Payer: Self-pay | Admitting: *Deleted

## 2017-03-21 DIAGNOSIS — F4323 Adjustment disorder with mixed anxiety and depressed mood: Secondary | ICD-10-CM | POA: Diagnosis not present

## 2017-03-21 NOTE — Telephone Encounter (Signed)
-----   Message from Annia Belt, MD sent at 03/19/2017  6:16 PM EST ----- Call pt: platelet count stable @108 ,000

## 2017-03-21 NOTE — Telephone Encounter (Signed)
Pt called / informed  "platelet count stable @108 ,000" per Dr Beryle Beams. Stated thank-you for calling and will see [place] on Tuesday.

## 2017-03-22 ENCOUNTER — Ambulatory Visit (INDEPENDENT_AMBULATORY_CARE_PROVIDER_SITE_OTHER): Payer: Medicare Other | Admitting: Cardiovascular Disease

## 2017-03-22 ENCOUNTER — Encounter: Payer: Self-pay | Admitting: Cardiovascular Disease

## 2017-03-22 VITALS — BP 118/70 | HR 58 | Ht 65.0 in | Wt 142.4 lb

## 2017-03-22 DIAGNOSIS — I10 Essential (primary) hypertension: Secondary | ICD-10-CM

## 2017-03-22 DIAGNOSIS — E7800 Pure hypercholesterolemia, unspecified: Secondary | ICD-10-CM | POA: Diagnosis not present

## 2017-03-22 DIAGNOSIS — I2510 Atherosclerotic heart disease of native coronary artery without angina pectoris: Secondary | ICD-10-CM

## 2017-03-22 MED ORDER — CARVEDILOL 3.125 MG PO TABS: 3.1250 mg | ORAL_TABLET | Freq: Two times a day (BID) | ORAL | 3 refills | Status: DC

## 2017-03-22 MED ORDER — NITROGLYCERIN 0.4 MG SL SUBL: 0.4000 mg | SUBLINGUAL_TABLET | SUBLINGUAL | 6 refills | Status: DC | PRN

## 2017-03-22 MED ORDER — ATORVASTATIN CALCIUM 20 MG PO TABS: 20.0000 mg | ORAL_TABLET | Freq: Every day | ORAL | 3 refills | Status: DC

## 2017-03-22 MED ORDER — LOSARTAN POTASSIUM 50 MG PO TABS: 50.0000 mg | ORAL_TABLET | Freq: Every day | ORAL | 3 refills | Status: DC

## 2017-03-22 NOTE — Patient Instructions (Signed)

## 2017-03-22 NOTE — Progress Notes (Signed)
Chief Complaint  Patient presents with  . Coronary Artery Disease  '  History of Present Illness: 76 yo male with history of CAD, HTN, hyperlipidemia, idiopathic thrombocytopenia and OSA here today for cardiac follow up. He had an anterior STEMI in January 2009 and had a drug eluting stent placed in the LAD. His ejection fraction improved from 25% to 40-50% following the first event. No evidence of AAA on abdominal aortic u/s December 2013. He has since been diagnosed with T-cell lymphoma but this is in remission. Nuclear stress test 2014 with scar in the anterior wall, apex and inferoapical wall with LVEF of 55%. No ischemic EKG changes. Cardiac cath May 2014 with mild to moderate CAD, no obstructive lesions.   He is here today for follow up. The patient denies any chest pain, dyspnea, palpitations, lower extremity edema, orthopnea, PND, dizziness, near syncope or syncope.   Primary Care Physician: Dr. [name]   Past Medical History:  Diagnosis Date  . Allergy   . Anxiety   . Arthritis    "all over" (2012-11-02)  . Basal cell carcinoma of head    "right side; top of head" (11/02/2012)  . Benign prostatic hypertrophy   . CAD (coronary artery disease)   . Chronic anticoagulation 02/20/2016  . Chronic lower back pain    "from a herniated disc" (02-Nov-2012)  . Depression   . Diverticulosis of colon   . Dysrhythmia   . Factor VII deficiency ([place])    10/28/15: No history of factor VII deficiency per Dr. Beryle Beams  . History of blood transfusion 1941  . History of colon polyps   . Hyperlipidemia   . ITP (idiopathic thrombocytopenic purpura)   . Kidney stones    "I've had them twice; last time was ~ 9 yr ago; both times passed on their own" (2012-11-02)  . Myocardial infarction ([place]) 2009  . Pneumonia    "I think I had as a child" (11/02/2012)  . Squamous carcinoma    "left side; top of head" (2012/11/02)  . T-cell lymphoma ([place])    "I'm in stage I; original site was on my left  chest; had a place around my waist treated last summer; got a place on back of my left leg" (02-Nov-2012)    Past Surgical History:  Procedure Laterality Date  . CARDIAC CATHETERIZATION  2012/11/02  . CATARACT EXTRACTION W/ INTRAOCULAR LENS  IMPLANT, BILATERAL Bilateral 2000's  . COLONOSCOPY    . CORONARY ANGIOPLASTY WITH STENT PLACEMENT  05/29/2007   "1" (11-02-2012)  . CYST EXCISION  1980's?   "or fatty tumor; taken off my back" (11-02-12)  . KNEE SURGERY     right  . LUMBAR EPIDURAL INJECTION  ~ 2004  . MOHS SURGERY Bilateral 2011-2012   "top of my head" (2012/11/02)  . TONSILLECTOMY  ~ 1945  . VASECTOMY      Current Outpatient Medications  Medication Sig Dispense Refill  . acetaminophen (TYLENOL) 650 MG CR tablet Take 650 mg by mouth 2 (two) times daily.     Marland Kitchen ALPRAZolam (XANAX) 0.5 MG tablet Take 1 tablet (0.5 mg total) by mouth 2 (two) times daily as needed for anxiety (for sleep). 60 tablet 5  . aspirin 81 MG tablet Take 81 mg by mouth daily.      Marland Kitchen atorvastatin (LIPITOR) 20 MG tablet Take 1 tablet (20 mg total) daily by mouth. 90 tablet 3  . carvedilol (COREG) 3.125 MG tablet Take 1 tablet (3.125 mg total) 2 (two) times  daily with a meal by mouth. 180 tablet 3  . Cholecalciferol (VITAMIN D3) 1000 UNITS tablet Take 1,000 Units by mouth daily.     . Cyanocobalamin (VITAMIN B 12 PO) Take 500 mg by mouth daily.    Marland Kitchen losartan (COZAAR) 50 MG tablet Take 1 tablet (50 mg total) daily by mouth. 90 tablet 3  . Magnesium 250 MG TABS Take 250 mg by mouth daily.     . nitroGLYCERIN (NITROSTAT) 0.4 MG SL tablet Place 1 tablet (0.4 mg total) every 5 (five) minutes as needed under the tongue for chest pain (for chest pain). 25 tablet 6  . Omega-3 Fatty Acids (FISH OIL) 1000 MG CAPS Take 1,000 mg by mouth 2 (two) times daily.     . Saw Palmetto 450 MG CAPS Take 450 mg by mouth 2 (two) times daily.      Current Facility-Administered Medications  Medication Dose Route Frequency Provider Last  Rate Last Dose  . 0.9 %  sodium chloride infusion  500 mL Intravenous Continuous Milus Banister, MD        Allergies  Allergen Reactions  . Fluoxetine Hcl Other (See Comments)    nightmares  . Other Other (See Comments)    ANTI-DEPRESSANTS. ANTI-DEPRESSANTS Antidepressants...has a variety of reactions to different antidepressants  . Prozac [Fluoxetine Hcl] Other (See Comments)    nightmares  . Oxycodone Other (See Comments)    unknown    Social History   Socioeconomic History  . Marital status: Married    Spouse name: Not on file  . Number of children: 2  . Years of education: Not on file  . Highest education level: Not on file  Social Needs  . Financial resource strain: Not on file  . Food insecurity - worry: Not on file  . Food insecurity - inability: Not on file  . Transportation needs - medical: Not on file  . Transportation needs - non-medical: Not on file  Occupational History  . Occupation: Retired  Tobacco Use  . Smoking status: Never Smoker  . Smokeless tobacco: Never Used  Substance and Sexual Activity  . Alcohol use: No    Alcohol/week: 0.0 oz  . Drug use: No  . Sexual activity: Not Currently  Other Topics Concern  . Not on file  Social History Narrative   No caffeine     Family History  Problem Relation Age of Onset  . Alzheimer's disease Mother   . Lymphoma Father   . Coronary artery disease Brother   . COPD Brother   . Diabetes Brother   . Colon cancer Paternal Uncle   . Diabetes Brother     Review of Systems:  As stated in the HPI and otherwise negative.   BP 118/70   Pulse (!) 58   Ht 5\' 5"  (1.651 m)   Wt 142 lb 6.4 oz (64.6 kg)   SpO2 99%   BMI 23.70 kg/m   Physical Examination:  General: Well developed, well nourished, NAD  HEENT: OP clear, mucus membranes moist  SKIN: warm, dry. No rashes. Neuro: No focal deficits  Musculoskeletal: Muscle strength 5/5 all ext  Psychiatric: Mood and affect normal  Neck: No JVD, no carotid  bruits, no thyromegaly, no lymphadenopathy.  Lungs:Clear bilaterally, no wheezes, rhonci, crackles Cardiovascular: Regular rate and rhythm. No murmurs, gallops or rubs. Abdomen:Soft. Bowel sounds present. Non-tender.  Extremities: No lower extremity edema. Pulses are 2 + in the bilateral DP/PT.  Stress myoview 09/25/12:  Rest Procedure: Myocardial perfusion  imaging was performed at rest 45 minutes following the intravenous administration of Technetium 38m Sestamibi.  Rest ECG: NSR old anterior MI  Stress Procedure: The patient exercised on the treadmill utilizing the Bruce Protocol for 10:15 minutes. The patient stopped due to fatigue, and chest tightness 4/10. This patient had rare pacs/pvcs with exercise.Technetium 60m Sestamibi was injected at peak exercise and myocardial perfusion imaging was performed after a brief delay.  Stress ECG: No significant change from baseline ECG  QPS  Raw Data Images: Normal; no motion artifact; normal heart/lung ratio.  Stress Images: There is decreased uptake in the anterior wall.  Rest Images: There is decreased uptake in the anterior wall.  Subtraction (SDS): There is a fixed defect that is most consistent with a previous infarction.  Transient Ischemic Dilatation (Normal <1.22): 0.92  Lung/Heart Ratio (Normal <0.45): 0.21  Quantitative Gated Spect Images  QGS EDV: 93 ml  QGS ESV: 42 ml  Impression  Exercise Capacity: Excellent exercise capacity.  BP Response: Normal blood pressure response.  Clinical Symptoms: Chest tightness  ECG Impression: No significant ST segment change suggestive of ischemia.  Comparison with Prior Nuclear Study: No images to compare  Overall Impression: Intermediate risk stress nuclear study Moderate mid and distal anterior wall, apical and inferoapical wall infarct with no ischemia.  LV Ejection Fraction: 55%. LV Wall Motion: EF reported as normal but clearly there is an anteroapical wall motion abnormality. Suggest MRI to  quantitate and assess scar burden   Cardiac cath may 2014: Left main: No obstructive disease.  Left Anterior Descending Artery: Large caliber vessel that courses to the apex. The proximal vessel has mild plaque disease. The mid stented segment is patent. The small caliber diagonal branch is patent with ostial 70% stenosis as it is jailed by the stent and unchanged in appearance.  Circumflex Artery: Moderate caliber vessel with moderate caliber obtuse marginal branch with no significant disease. The AV groove Circumflex has a 40% stenosis just after the takeoff of the OM branch, unchanged.  Right Coronary Artery: Large, dominant vessel with serial 20% lesions throughout the proximal vessel. The mid vessel has a 40% stenosis. The distal vessel has mild plaque disease.  Left Ventricular Angiogram: LVEF 55% with anterior and apical hypokinesis.   EKG:  EKG is ordered today. The ekg ordered today demonstrates sinus bradycardia, rate 58 bpm. Non-specific T wave abnormaltiy.   Recent Labs: 03/19/2017: ALT 16; BUN 18; Creatinine, Ser 0.82; Hemoglobin 14.8; Platelets 108; Potassium 4.6; Sodium 143   Lipid Panel Lipid Panel     Component Value Date/Time   CHOL 108 08/17/2016 0946   TRIG 67.0 08/17/2016 0946   HDL 55.50 08/17/2016 0946   CHOLHDL 2 08/17/2016 0946   VLDL 13.4 08/17/2016 0946   LDLCALC 40 08/17/2016 0946   LDLDIRECT 137.3 08/08/2006 1059      Wt Readings from Last 3 Encounters:  03/22/17 142 lb 6.4 oz (64.6 kg)  01/29/17 137 lb 6.4 oz (62.3 kg)  01/05/17 138 lb 1.3 oz (62.6 kg)     Other studies Reviewed: Additional studies/ records that were reviewed today include: . Review of the above records demonstrates:   Assessment and Plan:   1. CAD without angina: No chest pain suggestive of angina. Will continue ASA, statin and beta blocker.    2. HTN: BP is controlled. No changes  3. HLD: LDL at goal. Continue statin.   Current medicines are reviewed at length with the  patient today.  The patient does not have  concerns regarding medicines.  The following changes have been made:  no change  Labs/ tests ordered today include:   Orders Placed This Encounter  Procedures  . EKG 12-Lead    Disposition:   FU with me in 12 months  Signed, Lauree Chandler, MD 03/22/2017 3:30 PM    [address], [place], [place]  [address] Phone: [phone]; Fax: [phone]

## 2017-03-26 ENCOUNTER — Other Ambulatory Visit: Payer: Self-pay

## 2017-03-26 ENCOUNTER — Encounter: Payer: Self-pay | Admitting: Oncology

## 2017-03-26 ENCOUNTER — Ambulatory Visit (INDEPENDENT_AMBULATORY_CARE_PROVIDER_SITE_OTHER): Payer: Medicare Other | Admitting: Oncology

## 2017-03-26 VITALS — BP 130/72 | HR 60 | Temp 98.2°F | Ht 65.0 in | Wt 140.8 lb

## 2017-03-26 DIAGNOSIS — D693 Immune thrombocytopenic purpura: Secondary | ICD-10-CM | POA: Diagnosis not present

## 2017-03-26 DIAGNOSIS — C8400 Mycosis fungoides, unspecified site: Secondary | ICD-10-CM | POA: Diagnosis not present

## 2017-03-26 NOTE — Progress Notes (Signed)
Hematology and Oncology Follow Up Visit  [name] [id number] January 15, 1941 76 y.o. 03/26/2017 7:36 PM   Principle Diagnosis: Encounter Diagnoses  Name Primary?  . Mycosis fungoides, stage 1 ([place])   . Chronic ITP (idiopathic thrombocytopenia) (HCC) Yes  Updated clinical summary: 76 year old man initially diagnosed with ITP in November 2009. Platelet counts have been consistently over 100,000 until 2016 when platelets dropped to 72,000 on April 4. Repeat value one week later 88,000. Platelet count rebounded to his prior baseline and was 100,000 on 03/02/2015 and 109,000 on 08/29/2015 and have remained stable since then. He has never required any specific treatment. He developed a patchy rash over a 4 cm area, left pectoral region of his chest, which was there for about 7 years. He changed dermatologists. He was evaluated by Dr. Jari Pigg. 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 He was referred to Dr. Adelene Idler at [facility]. Repeat biopsies were confirmatory. He was treated with topical clobetasol ointment with complete regression of the rash. He has had no recurrent areas of involvement. I obtained baseline CAT scans in August of 2012 and there was no organomegaly or lymphadenopathy. However, a lesion was seen on his scalp which was subsequently excised on 05/31/2011 and showed poorly differentiated squamous cell carcinoma. This appeared to be a localized process and not a metastasis. He has a history of a tubular adenoma removed at time of a colonoscopy 09/04/2011 by Dr. Oretha Caprice. He has coronary artery disease status post MI in 2009 status post LAD stent.  He has ongoing follow-up with cardiology, Dr. Angelena Form, and was just seen last week.  No new medication changes.  Interim History: He was not in a very good mood today.  He has been having problems with his wife who is experiencing some cognitive decline.  This is causing  him a great deal of stress.  He elected to change primary care physicians.  His wife is under evaluation by neurology.  With respect to his health issues, there have been no acute changes since visit with me last year.  No active cardiopulmonary symptoms and recent follow-up with cardiology last week.  No new medication changes.  He has not developed any new skin rashes that might be associated with his underlying cutaneous T-cell lymphoma.  His platelet count has remained stable and he has had no clinical bleeding.  Medications: reviewed  Allergies:  Allergies  Allergen Reactions  . Fluoxetine Hcl Other (See Comments)    nightmares  . Other Other (See Comments)    ANTI-DEPRESSANTS. ANTI-DEPRESSANTS Antidepressants...has a variety of reactions to different antidepressants  . Prozac [Fluoxetine Hcl] Other (See Comments)    nightmares  . Oxycodone Other (See Comments)    unknown    Review of Systems: See interim history Remaining ROS negative:   Physical Exam: Blood pressure 130/72, pulse 60, temperature 98.2 F (36.8 C), temperature source Oral, height 5\' 5"  (1.651 m), weight 140 lb 12.8 oz (63.9 kg), SpO2 100 %. Wt Readings from Last 3 Encounters:  03/26/17 140 lb 12.8 oz (63.9 kg)  03/22/17 142 lb 6.4 oz (64.6 kg)  01/29/17 137 lb 6.4 oz (62.3 kg)     General appearance: Thin Caucasian man HENNT: Pharynx no erythema, exudate, mass, or ulcer. No thyromegaly or thyroid nodules Lymph nodes: No cervical, supraclavicular, or axillary lymphadenopathy Breasts: Lungs: Clear to auscultation, resonant to percussion throughout Heart: Regular rhythm, no murmur, no gallop, no  rub, no click, no edema Abdomen: Soft, nontender, normal bowel sounds, no mass, no organomegaly Extremities: No edema, no calf tenderness Musculoskeletal: no joint deformities GU:  Vascular: Carotid pulses 2+, no bruits, Neurologic: Alert, oriented, PERRLA, bilateral intraocular lenses, optic discs sharp and  vessels normal, no hemorrhage or exudate, cranial nerves grossly normal, motor strength 5 over 5, reflexes 1+ symmetric, upper body coordination normal, gait normal, Skin: No rash or ecchymosis  Lab Results: CBC W/Diff    Component Value Date/Time   WBC 4.1 03/19/2017 1132   RBC 4.38 03/19/2017 1132   HGB 14.8 03/19/2017 1132   HGB 14.6 03/23/2013 1424   HCT 44.1 03/19/2017 1132   HCT 45.4 03/23/2013 1424   PLT 108 (L) 03/19/2017 1132   PLT 108 (L) 03/23/2013 1424   MCV 100.7 (H) 03/19/2017 1132   MCV 100.0 (H) 03/23/2013 1424   MCH 33.8 03/19/2017 1132   MCHC 33.6 03/19/2017 1132   RDW 13.0 03/19/2017 1132   RDW 12.7 03/23/2013 1424   LYMPHSABS 1.0 03/19/2017 1132   LYMPHSABS 1.1 03/23/2013 1424   MONOABS 0.2 03/19/2017 1132   MONOABS 0.4 03/23/2013 1424   EOSABS 0.1 03/19/2017 1132   EOSABS 0.1 03/23/2013 1424   BASOSABS 0.0 03/19/2017 1132   BASOSABS 0.0 03/23/2013 1424     Chemistry      Component Value Date/Time   NA 143 03/19/2017 1132   NA 142 03/23/2013 1424   K 4.6 03/19/2017 1132   K 4.7 03/23/2013 1424   CL 105 03/19/2017 1132   CL 106 09/22/2012 1428   CO2 25 03/19/2017 1132   CO2 28 03/23/2013 1424   BUN 18 03/19/2017 1132   BUN 18.1 03/23/2013 1424   CREATININE 0.82 03/19/2017 1132   CREATININE 0.85 10/25/2014 1040   CREATININE 0.8 03/23/2013 1424      Component Value Date/Time   CALCIUM 9.0 03/19/2017 1132   CALCIUM 9.4 03/23/2013 1424   ALKPHOS 63 03/19/2017 1132   ALKPHOS 62 03/23/2013 1424   AST 22 03/19/2017 1132   AST 25 03/23/2013 1424   ALT 16 03/19/2017 1132   ALT 27 03/23/2013 1424   BILITOT 1.1 03/19/2017 1132   BILITOT 0.86 03/23/2013 1424       Radiological Studies: No results found.  Impression:  1.  Chronic ITP Platelet count stable with most recent count 108,000 on March 19, 2017  2.  Localized cutaneous T-cell lymphoma treated successfully with topical steroids with no recurrence.  3.  Coronary artery disease.   Currently asymptomatic.  Continues follow-up with his cardiologist.  4.  Squamous cell carcinoma localized to the scalp previously excised.  5.  Tubular adenomas of the colon  6.  Essential hypertension  8.  Hyperlipidemia  Continue every 6 month CBCs and annual visits with me.  CC: Patient Care Team: Eulas Post, MD as PCP - General (Family Medicine) Annia Belt, MD as Consulting Physician (Oncology) Josue Hector, MD as Consulting Physician (Cardiology) Jari Pigg, MD as Consulting Physician (Dermatology) Renaldo Harrison, MD as Referring Physician (Dermatology)   Murriel Hopper, MD, [place]  Hematology-Oncology/Internal Medicine     11/13/20187:36 PM

## 2017-03-26 NOTE — Patient Instructions (Signed)
Lab 09/23/17 Lab 03/18/18 MD visit 1 week after lab in Nov/Dec 2019

## 2017-04-18 DIAGNOSIS — F4323 Adjustment disorder with mixed anxiety and depressed mood: Secondary | ICD-10-CM | POA: Diagnosis not present

## 2017-04-30 ENCOUNTER — Encounter: Payer: Self-pay | Admitting: Family Medicine

## 2017-04-30 ENCOUNTER — Ambulatory Visit (INDEPENDENT_AMBULATORY_CARE_PROVIDER_SITE_OTHER): Payer: Medicare Other | Admitting: Family Medicine

## 2017-04-30 VITALS — BP 140/78 | HR 58 | Temp 98.3°F

## 2017-04-30 DIAGNOSIS — J0100 Acute maxillary sinusitis, unspecified: Secondary | ICD-10-CM | POA: Diagnosis not present

## 2017-04-30 DIAGNOSIS — J40 Bronchitis, not specified as acute or chronic: Secondary | ICD-10-CM | POA: Diagnosis not present

## 2017-04-30 DIAGNOSIS — I2510 Atherosclerotic heart disease of native coronary artery without angina pectoris: Secondary | ICD-10-CM | POA: Diagnosis not present

## 2017-04-30 MED ORDER — FLUTICASONE PROPIONATE 50 MCG/ACT NA SUSP: 1.0000 | Freq: Two times a day (BID) | NASAL | 6 refills | Status: DC

## 2017-04-30 MED ORDER — CEPHALEXIN 500 MG PO CAPS: 500.0000 mg | ORAL_CAPSULE | Freq: Four times a day (QID) | ORAL | 0 refills | Status: DC

## 2017-05-05 NOTE — Progress Notes (Signed)
History of Present Illness:   [name] is a 76 y.o. male who presents for evaluation of possible sinus infection. Symptoms include achiness, cough described as productive of purulent sputum, post nasal drip, purulent nasal discharge, sinus pressure with no fever, chills, night sweats or weight loss. Onset of symptoms was 2 weeks ago, gradually worsening since that time.  He is drinking plenty of fluids. Patient is a non-smoker.  PMHx, SurgHx, SocialHx, Medications, and Allergies were reviewed in the Visit Navigator and updated as appropriate.   Past Medical History:   Past Medical History:  Diagnosis Date  . Allergy   . Anxiety   . Arthritis    "all over" (10/16/12)  . Basal cell carcinoma of head    "right side; top of head" (2012/10/16)  . Benign prostatic hypertrophy   . CAD (coronary artery disease)   . Chronic anticoagulation 02/20/2016  . Chronic lower back pain    "from a herniated disc" (Oct 16, 2012)  . Depression   . Diverticulosis of colon   . Dysrhythmia   . Factor VII deficiency ([place])    10/28/15: No history of factor VII deficiency per Dr. Beryle Beams  . History of blood transfusion 1941  . History of colon polyps   . Hyperlipidemia   . ITP (idiopathic thrombocytopenic purpura)   . Kidney stones    "I've had them twice; last time was ~ 9 yr ago; both times passed on their own" (10-16-12)  . Myocardial infarction ([place]) 2009  . Pneumonia    "I think I had as a child" (16-Oct-2012)  . Squamous carcinoma    "left side; top of head" (10/16/12)  . T-cell lymphoma ([place])    "I'm in stage I; original site was on my left chest; had a place around my waist treated last summer; got a place on back of my left leg" (10-16-2012)     Past Surgical History:   Past Surgical History:  Procedure Laterality Date  . CARDIAC CATHETERIZATION  October 16, 2012  . CATARACT EXTRACTION W/ INTRAOCULAR LENS  IMPLANT, BILATERAL Bilateral 2000's  . COLONOSCOPY    . CORONARY ANGIOPLASTY WITH  STENT PLACEMENT  05/29/2007   "1" (16-Oct-2012)  . CYST EXCISION  1980's?   "or fatty tumor; taken off my back" (10-16-12)  . KNEE SURGERY     right  . LEFT HEART CATHETERIZATION WITH CORONARY ANGIOGRAM N/A 10/16/12   Procedure: LEFT HEART CATHETERIZATION WITH CORONARY ANGIOGRAM;  Surgeon: Burnell Blanks, MD;  Location[facility] CATH LAB;  Service: Cardiovascular;  Laterality: N/A;  . LUMBAR EPIDURAL INJECTION  ~ 2004  . MOHS SURGERY Bilateral 2011-2012   "top of my head" (2012-10-16)  . PARTIAL KNEE ARTHROPLASTY Right 11/08/2015   Procedure: RIGHT UNICOMPARTMENTAL KNEE;  Surgeon: Renette Butters, MD;  Location: [facility];  Service: Orthopedics;  Laterality: Right;  . TONSILLECTOMY  ~ 1945  . VASECTOMY       Allergies:   Allergies  Allergen Reactions  . Fluoxetine Hcl Other (See Comments)    nightmares  . Other Other (See Comments)    ANTI-DEPRESSANTS. ANTI-DEPRESSANTS Antidepressants...has a variety of reactions to different antidepressants  . Prozac [Fluoxetine Hcl] Other (See Comments)    nightmares  . Oxycodone Other (See Comments)    unknown     Current Medications:   Prior to Admission medications   Medication Sig Start Date End Date Taking? Authorizing Provider  acetaminophen (TYLENOL) 650 MG CR tablet Take 650 mg by mouth 2 (two) times daily.  [provider]  ALPRAZolam Duanne Moron) 0.5 MG tablet Take 1 tablet (0.5 mg total) by mouth 2 (two) times daily as needed for anxiety (for sleep). 08/17/16   Burchette, Alinda Sierras, MD  aspirin 81 MG tablet Take 81 mg by mouth daily.      [provider]  atorvastatin (LIPITOR) 20 MG tablet Take 1 tablet (20 mg total) daily by mouth. 03/22/17   Burnell Blanks, MD  carvedilol (COREG) 3.125 MG tablet Take 1 tablet (3.125 mg total) 2 (two) times daily with a meal by mouth. 03/22/17   Burnell Blanks, MD  cephALEXin (KEFLEX) 500 MG capsule Take 1 capsule (500 mg total) by mouth 4 (four) times daily. 04/30/17    Briscoe Deutscher, DO  Cholecalciferol (VITAMIN D3) 1000 UNITS tablet Take 1,000 Units by mouth daily.     [provider]  Cyanocobalamin (VITAMIN B 12 PO) Take 500 mg by mouth daily.    [provider]  fluticasone (FLONASE) 50 MCG/ACT nasal spray Place 1 spray into both nostrils 2 (two) times daily. 04/30/17   Briscoe Deutscher, DO  losartan (COZAAR) 50 MG tablet Take 1 tablet (50 mg total) daily by mouth. 03/22/17   Burnell Blanks, MD  Magnesium 250 MG TABS Take 250 mg by mouth daily.     [provider]  nitroGLYCERIN (NITROSTAT) 0.4 MG SL tablet Place 1 tablet (0.4 mg total) every 5 (five) minutes as needed under the tongue for chest pain (for chest pain). 03/22/17   Burnell Blanks, MD  Omega-3 Fatty Acids (FISH OIL) 1000 MG CAPS Take 1,000 mg by mouth 2 (two) times daily.     [provider]  Saw Palmetto 450 MG CAPS Take 450 mg by mouth 2 (two) times daily.     [provider]     Social History:   Social History   Tobacco Use  . Smoking status: Never Smoker  . Smokeless tobacco: Never Used  Substance Use Topics  . Alcohol use: No    Alcohol/week: 0.0 oz  . Drug use: No    Review of Systems:   No unusual headaches, no dizziness. No dyspnea or chest pain on exertion. No abdominal pain, change in bowel habits, black or bloody stools.  No urinary tract symptoms. No new or unusual musculoskeletal symptoms. No edema.  Vitals:   Vitals:   04/30/17 1558  BP: 140/78  Pulse: (!) 58  Temp: 98.3 F (36.8 C)  TempSrc: Oral  SpO2: 98%     There is no height or weight on file to calculate BMI.  Physical Exam:   General: alert, cooperative, in NAD Eyes: PERRL, EOMI, conjunctiva clear HENT: oropharynx clear, moist mucous membranes, posterior pharyngeal erythema, PND, bilateral maxillary and frontal sinus ttp Cardiovascular: regular rate and rhythm, S1, S2 normal, no murmur, click, rub or gallop Respiratory: central lung  congestion bilaterall, cough Abdomen: soft, non-tender; bowel sounds normal; no masses,  no organomegaly Extremities: extremities normal, atraumatic, no cyanosis or edema Pulses: 2+ and symmetric Skin: Skin color, texture, turgor normal. No rashes or lesions  Assessment and Plan:    Ramona was seen today for sinusitis.  Diagnoses and all orders for this visit:  Subacute maxillary sinusitis  Bronchitis  Other orders -     cephALEXin (KEFLEX) 500 MG capsule; Take 1 capsule (500 mg total) by mouth 4 (four) times daily. -     fluticasone (FLONASE) 50 MCG/ACT nasal spray; Place 1 spray into both nostrils 2 (two)  times daily.     . Reviewed expectations re: course of current medical issues. . Discussed self-management of symptoms. . Outlined signs and symptoms indicating need for more acute intervention. . Patient verbalized understanding and all questions were answered. . See orders for this visit as documented in the electronic medical record. . Patient received an After Visit Summary.  Briscoe Deutscher, D.O.

## 2017-05-23 DIAGNOSIS — F4323 Adjustment disorder with mixed anxiety and depressed mood: Secondary | ICD-10-CM | POA: Diagnosis not present

## 2017-07-15 ENCOUNTER — Ambulatory Visit: Payer: Medicare Other | Admitting: Sports Medicine

## 2017-07-25 DIAGNOSIS — M48061 Spinal stenosis, lumbar region without neurogenic claudication: Secondary | ICD-10-CM | POA: Diagnosis not present

## 2017-07-25 DIAGNOSIS — M545 Low back pain: Secondary | ICD-10-CM | POA: Diagnosis not present

## 2017-07-25 DIAGNOSIS — M47817 Spondylosis without myelopathy or radiculopathy, lumbosacral region: Secondary | ICD-10-CM | POA: Diagnosis not present

## 2017-07-26 ENCOUNTER — Telehealth: Payer: Self-pay | Admitting: Cardiovascular Disease

## 2017-07-26 NOTE — Telephone Encounter (Signed)
New message    Pt c/o medication issue:  1. Name of Medication: Prednisone  2. How are you currently taking this medication (dosage and times per day)? As prescribed  3. Are you having a reaction (difficulty breathing--STAT)? NO  4. What is your medication issue? Patient wants to confirm it is safe to take Prednisone with heart medications. Please call

## 2017-07-26 NOTE — Telephone Encounter (Signed)
Reviewed with Tana Coast, PharmD and OK for pt to take. I spoke with pt and gave him this information. He has been prescribed a 6 day course of prednisone for arthritis.

## 2017-08-06 DIAGNOSIS — L821 Other seborrheic keratosis: Secondary | ICD-10-CM | POA: Diagnosis not present

## 2017-08-06 DIAGNOSIS — L570 Actinic keratosis: Secondary | ICD-10-CM | POA: Diagnosis not present

## 2017-08-06 DIAGNOSIS — L309 Dermatitis, unspecified: Secondary | ICD-10-CM | POA: Diagnosis not present

## 2017-08-06 DIAGNOSIS — C84A0 Cutaneous T-cell lymphoma, unspecified, unspecified site: Secondary | ICD-10-CM | POA: Diagnosis not present

## 2017-08-06 DIAGNOSIS — B353 Tinea pedis: Secondary | ICD-10-CM | POA: Diagnosis not present

## 2017-08-06 DIAGNOSIS — Z85828 Personal history of other malignant neoplasm of skin: Secondary | ICD-10-CM | POA: Diagnosis not present

## 2017-08-06 DIAGNOSIS — D2271 Melanocytic nevi of right lower limb, including hip: Secondary | ICD-10-CM | POA: Diagnosis not present

## 2017-08-21 ENCOUNTER — Encounter: Payer: Self-pay | Admitting: Family Medicine

## 2017-08-21 ENCOUNTER — Ambulatory Visit (INDEPENDENT_AMBULATORY_CARE_PROVIDER_SITE_OTHER): Payer: Medicare Other | Admitting: Family Medicine

## 2017-08-21 VITALS — BP 122/84 | HR 58 | Temp 97.8°F | Ht 65.0 in | Wt 147.2 lb

## 2017-08-21 DIAGNOSIS — D61818 Other pancytopenia: Secondary | ICD-10-CM | POA: Diagnosis not present

## 2017-08-21 DIAGNOSIS — I10 Essential (primary) hypertension: Secondary | ICD-10-CM | POA: Diagnosis not present

## 2017-08-21 DIAGNOSIS — R350 Frequency of micturition: Secondary | ICD-10-CM

## 2017-08-21 DIAGNOSIS — R5383 Other fatigue: Secondary | ICD-10-CM

## 2017-08-21 DIAGNOSIS — E782 Mixed hyperlipidemia: Secondary | ICD-10-CM

## 2017-08-21 DIAGNOSIS — I471 Supraventricular tachycardia, unspecified: Secondary | ICD-10-CM

## 2017-08-21 DIAGNOSIS — F418 Other specified anxiety disorders: Secondary | ICD-10-CM

## 2017-08-21 DIAGNOSIS — Z0000 Encounter for general adult medical examination without abnormal findings: Secondary | ICD-10-CM | POA: Diagnosis not present

## 2017-08-21 DIAGNOSIS — D693 Immune thrombocytopenic purpura: Secondary | ICD-10-CM

## 2017-08-21 LAB — LIPID PANEL
Cholesterol: 125 mg/dL (ref 0–200)
HDL: 63.1 mg/dL (ref >39.00)
LDL Cholesterol: 54 mg/dL (ref 0–99)
NonHDL: 61.81
Total CHOL/HDL Ratio: 2
Triglycerides: 41 mg/dL (ref 0.0–149.0)
VLDL: 8.2 mg/dL (ref 0.0–40.0)

## 2017-08-21 LAB — PSA: PSA: 4.16 ng/mL — ABNORMAL HIGH (ref 0.10–4.00)

## 2017-08-21 MED ORDER — ALPRAZOLAM 0.5 MG PO TABS: 0.5000 mg | ORAL_TABLET | Freq: Two times a day (BID) | ORAL | 5 refills | Status: DC | PRN

## 2017-08-21 NOTE — Progress Notes (Addendum)
[name] is a 77 y.o. male is here for follow up.  History of Present Illness:   [name] CMA acting as scribe for Dr. [name].  HPI: Patient comes in for his yearly visit. He has been going through a lot due to his wife being in the hospital for heart attack. He was very upset with [facility] service during her stay at the hospital.   Depression: Patient has been going to a support group and also seeing an Social worker.   There are no preventive care reminders to display for this patient.   Depression screen [facility] 2/9 03/26/2017 08/17/2016 02/20/2016  Decreased Interest 0 0 0  Down, Depressed, Hopeless 0 0 0  PHQ - 2 Score 0 0 0   Review of Systems  Constitutional: Negative for chills and fever.  HENT: Negative for congestion and ear pain.   Eyes: Negative for blurred vision and double vision.  Respiratory: Negative for cough and shortness of breath.   Cardiovascular: Negative for chest pain and palpitations.  Gastrointestinal: Negative for diarrhea and vomiting.  Genitourinary: Negative for dysuria.  Musculoskeletal: Negative for back pain and neck pain.  Neurological: Negative for dizziness and headaches.  Psychiatric/Behavioral: Negative for depression and suicidal ideas.   PMHx, SurgHx, SocialHx, FamHx, Medications, and Allergies were reviewed in the Visit Navigator and updated as appropriate.   Patient Active Problem List   Diagnosis Date Noted  . Other pancytopenia ([place]) 08/23/2017  . Chronic anticoagulation 02/20/2016  . Osteoarthritis of right knee 11/08/2015  . Primary osteoarthritis of right knee 10/20/2015  . Bradycardia 11/02/2012  . SVT (supraventricular tachycardia) ([place]) 11/02/2012  . Chest pain at rest 10/03/2012  . Chronic insomnia 08/07/2012  . Chronic ITP (idiopathic thrombocytopenia) (HCC) 06/14/2011  . Mycosis fungoides, stage 1 ([place]) 06/14/2011  . Gallstone 06/14/2011  . History of kidney stones 06/14/2011  . CAD (coronary artery disease) 03/21/2011    . BEN HTN HEART DISEASE WITHOUT HEART FAIL 09/08/2009  . PERIODIC LIMB MOVEMENT DISORDER 09/17/2008  . Obstructive sleep apnea 09/14/2008  . CORONARY ATHEROSCLEROSIS NATIVE CORONARY ARTERY 09/08/2008  . Cardiomyopathy, ischemic 05/16/2008  . Thrombocytopenia ([place]) 02/26/2008  . Essential hypertension 07/10/2007  . Coronary atherosclerosis 07/10/2007  . URI 07/10/2007  . NEPHROLITHIASIS, HX OF 07/10/2007  . Hyperlipidemia 11/18/2006  . ALLERGIC RHINITIS 11/18/2006  . DIVERTICULOSIS, COLON 11/18/2006  . BPH (benign prostatic hypertrophy) 11/18/2006  . Osteoarthrosis, unspecified whether generalized or localized, unspecified site 11/18/2006  . RENAL COLIC 81/82/9937   Social History   Tobacco Use  . Smoking status: Never Smoker  . Smokeless tobacco: Never Used  Substance Use Topics  . Alcohol use: No    Alcohol/week: 0.0 oz  . Drug use: No   Current Medications and Allergies:   .  acetaminophen (TYLENOL) 650 MG CR tablet, Take 650 mg by mouth 2 (two) times daily. , Disp: , Rfl:  .  ALPRAZolam (XANAX) 0.5 MG tablet, Take 1 tablet (0.5 mg total) by mouth 2 (two) times daily as needed for anxiety (for sleep)., Disp: 60 tablet, Rfl: 5 .  aspirin 81 MG tablet, Take 81 mg by mouth daily.  , Disp: , Rfl:  .  atorvastatin (LIPITOR) 20 MG tablet, Take 1 tablet (20 mg total) daily by mouth., Disp: 90 tablet, Rfl: 3 .  carvedilol (COREG) 3.125 MG tablet, Take 1 tablet (3.125 mg total) 2 (two) times daily with a meal by mouth., Disp: 180 tablet, Rfl: 3 .  cephALEXin (KEFLEX) 500 MG capsule, Take  1 capsule (500 mg total) by mouth 4 (four) times daily., Disp: 40 capsule, Rfl: 0 .  Cholecalciferol (VITAMIN D3) 1000 UNITS tablet, Take 1,000 Units by mouth daily. , Disp: , Rfl:  .  Cyanocobalamin (VITAMIN B 12 PO), Take 500 mg by mouth daily., Disp: , Rfl:  .  fluticasone (FLONASE) 50 MCG/ACT nasal spray, Place 1 spray into both nostrils 2 (two) times daily., Disp: 16 g, Rfl: 6 .  losartan  (COZAAR) 50 MG tablet, Take 1 tablet (50 mg total) daily by mouth., Disp: 90 tablet, Rfl: 3 .  Magnesium 250 MG TABS, Take 250 mg by mouth daily. , Disp: , Rfl:  .  nitroGLYCERIN (NITROSTAT) 0.4 MG SL tablet, Place 1 tablet (0.4 mg total) every 5 (five) minutes as needed under the tongue for chest pain (for chest pain)., Disp: 25 tablet, Rfl: 6 .  Omega-3 Fatty Acids (FISH OIL) 1000 MG CAPS, Take 1,000 mg by mouth 2 (two) times daily. , Disp: , Rfl:  .  Saw Palmetto 450 MG CAPS, Take 450 mg by mouth 2 (two) times daily. , Disp: , Rfl:    Allergies  Allergen Reactions  . Fluoxetine Hcl Other (See Comments)    nightmares  . Other Other (See Comments)    ANTI-DEPRESSANTS. ANTI-DEPRESSANTS Antidepressants...has a variety of reactions to different antidepressants  . Prozac [Fluoxetine Hcl] Other (See Comments)    nightmares  . Oxycodone Other (See Comments)    unknown   Review of Systems   Pertinent items are noted in the HPI. Otherwise, ROS is negative.  Vitals:   Vitals:   08/21/17 0852  BP: 122/84  Pulse: (!) 58  Temp: 97.8 F (36.6 C)  TempSrc: Oral  SpO2: 96%  Weight: 147 lb 3.2 oz (66.8 kg)  Height: 5\' 5"  (1.651 m)     Body mass index is 24.5 kg/m.   Physical Exam:   Physical Exam  Constitutional: He is oriented to person, place, and time. He appears well-developed and well-nourished. No distress.  HENT:  Head: Normocephalic and atraumatic.  Right Ear: External ear normal.  Left Ear: External ear normal.  Nose: Nose normal.  Mouth/Throat: Oropharynx is clear and moist.  Eyes: Pupils are equal, round, and reactive to light. Conjunctivae and EOM are normal.  Neck: Normal range of motion. Neck supple.  Cardiovascular: Normal rate, regular rhythm and intact distal pulses.  Pulmonary/Chest: Effort normal.  Abdominal: Soft.  Musculoskeletal: Normal range of motion.  Neurological: He is alert and oriented to person, place, and time.  Skin: Skin is warm and dry.    Psychiatric: He has a normal mood and affect. His behavior is normal. Judgment and thought content normal.  Nursing note and vitals reviewed.   Results for orders placed or performed in visit on 08/21/17  Lipid panel  Result Value Ref Range   Cholesterol 125 0 - 200 mg/dL   Triglycerides 41.0 0.0 - 149.0 mg/dL   HDL 63.10 >39.00 mg/dL   VLDL 8.2 0.0 - 40.0 mg/dL   LDL Cholesterol 54 0 - 99 mg/dL   Total CHOL/HDL Ratio 2    NonHDL 61.81   PSA  Result Value Ref Range   PSA 4.16 (H) 0.10 - 4.00 ng/mL   Assessment and Plan:   Diagnoses and all orders for this visit:  Essential hypertension Comments: Controlled at this time.  Continue current treatment.  Lipid screening -     Lipid panel  Fatigue Comments: Ongoing.  Somewhat secondary to stress levels.  We reviewed the importance of self-care and exercise.  Urinary frequency Comments: PSA very slightly elevated.  Okay to monitor and recheck or send to urology if the patient would like that. Orders: -     PSA  Medicare annual wellness visit, subsequent Comments: Completed today. Will be scanned into media.  Situational anxiety -     ALPRAZolam (XANAX) 0.5 MG tablet; Take 1 tablet (0.5 mg total) by mouth 2 (two) times daily as needed for anxiety (for sleep).  Other pancytopenia ([place])  SVT (supraventricular tachycardia) (HCC)  Chronic ITP (idiopathic thrombocytopenia) (HCC)   . Reviewed expectations re: course of current medical issues. . Discussed self-management of symptoms. . Outlined signs and symptoms indicating need for more acute intervention. . Patient verbalized understanding and all questions were answered. Marland Kitchen Health Maintenance issues including appropriate healthy diet, exercise, and smoking avoidance were discussed with patient. . See orders for this visit as documented in the electronic medical record. . Patient received an After Visit Summary.  Briscoe Deutscher, DO [place] [place] 08/23/2017  Future Appointments  Date Time Provider [place]  09/23/2017 11:30 AM [facility] LAB [facility] [place]  11/22/2017  3:00 PM [facility]  02/21/2018  1:00 PM Briscoe Deutscher, DO [facility]  03/18/2018 11:30 AM [facility]

## 2017-08-23 ENCOUNTER — Other Ambulatory Visit: Payer: Self-pay | Admitting: Surgical

## 2017-08-23 DIAGNOSIS — D61818 Other pancytopenia: Secondary | ICD-10-CM | POA: Insufficient documentation

## 2017-08-23 DIAGNOSIS — R972 Elevated prostate specific antigen [PSA]: Secondary | ICD-10-CM

## 2017-08-26 ENCOUNTER — Telehealth: Payer: Self-pay | Admitting: Family Medicine

## 2017-08-26 DIAGNOSIS — I10 Essential (primary) hypertension: Secondary | ICD-10-CM

## 2017-08-26 NOTE — Telephone Encounter (Signed)
Please advise. I thought that he wanted to wait until his labs with other provider due to having them done to often. He has labs 09/23/17.

## 2017-08-26 NOTE — Telephone Encounter (Signed)
So sorry, my mistake. Okay to add for lab only if he is willing.

## 2017-08-26 NOTE — Telephone Encounter (Signed)
I have scheduled patient for lab tomorrow and placed order.

## 2017-08-26 NOTE — Telephone Encounter (Signed)
Patient had questions about labs done at last visit. Patient stated he had spoke to Dr. Veva Holes prior to getting the labs done and he was under the impression that his CMP would be done as well. Patient stated he did not want to wait to go to Dr. Beryle Beams for these labs due to everything he is going through with his wife, he is concerned for his own health as well. Please call patient and advise.

## 2017-08-27 ENCOUNTER — Other Ambulatory Visit (INDEPENDENT_AMBULATORY_CARE_PROVIDER_SITE_OTHER): Payer: Medicare Other

## 2017-08-27 DIAGNOSIS — I10 Essential (primary) hypertension: Secondary | ICD-10-CM

## 2017-08-27 LAB — COMPREHENSIVE METABOLIC PANEL
ALT: 16 U/L (ref 0–53)
AST: 17 U/L (ref 0–37)
Albumin: 4 g/dL (ref 3.5–5.2)
Alkaline Phosphatase: 61 U/L (ref 39–117)
BUN: 18 mg/dL (ref 6–23)
CO2: 30 mEq/L (ref 19–32)
Calcium: 8.9 mg/dL (ref 8.4–10.5)
Chloride: 106 mEq/L (ref 96–112)
Creatinine, Ser: 0.87 mg/dL (ref 0.40–1.50)
GFR: 90 mL/min (ref >60.00)
Glucose, Bld: 137 mg/dL — ABNORMAL HIGH (ref 70–99)
Potassium: 3.8 mEq/L (ref 3.5–5.1)
Sodium: 141 mEq/L (ref 135–145)
Total Bilirubin: 1.1 mg/dL (ref 0.2–1.2)
Total Protein: 6.1 g/dL (ref 6.0–8.3)

## 2017-09-09 DIAGNOSIS — M25551 Pain in right hip: Secondary | ICD-10-CM | POA: Diagnosis not present

## 2017-09-10 ENCOUNTER — Ambulatory Visit (INDEPENDENT_AMBULATORY_CARE_PROVIDER_SITE_OTHER): Payer: Medicare Other | Admitting: Family Medicine

## 2017-09-10 VITALS — BP 124/62 | HR 72 | Temp 99.3°F | Wt 146.8 lb

## 2017-09-10 DIAGNOSIS — R05 Cough: Secondary | ICD-10-CM | POA: Diagnosis not present

## 2017-09-10 DIAGNOSIS — D61818 Other pancytopenia: Secondary | ICD-10-CM

## 2017-09-10 DIAGNOSIS — R059 Cough, unspecified: Secondary | ICD-10-CM

## 2017-09-10 MED ORDER — DOXYCYCLINE HYCLATE 100 MG PO TABS: 100.0000 mg | ORAL_TABLET | Freq: Two times a day (BID) | ORAL | 0 refills | Status: DC

## 2017-09-10 MED ORDER — PREDNISONE 5 MG PO TABS: 5.0000 mg | ORAL_TABLET | Freq: Every day | ORAL | 0 refills | Status: DC

## 2017-09-10 NOTE — Progress Notes (Signed)
[name] is a 77 y.o. male here for an acute visit.  History of Present Illness:   [name], CMA acting as scribe for Dr. Briscoe Deutscher.   HPI: Patient started having dry throat and itchy eyes on Sunday. He stared taking some antibiotics that he had at home. I has not helped he now has productive cough that is clear.   PMHx, SurgHx, SocialHx, Medications, and Allergies were reviewed in the Visit Navigator and updated as appropriate.  Current Medications:   Current Outpatient Medications:  .  acetaminophen (TYLENOL) 650 MG CR tablet, Take 650 mg by mouth 2 (two) times daily. , Disp: , Rfl:  .  ALPRAZolam (XANAX) 0.5 MG tablet, Take 1 tablet (0.5 mg total) by mouth 2 (two) times daily as needed for anxiety (for sleep)., Disp: 60 tablet, Rfl: 5 .  aspirin 81 MG tablet, Take 81 mg by mouth daily.  , Disp: , Rfl:  .  atorvastatin (LIPITOR) 20 MG tablet, Take 1 tablet (20 mg total) daily by mouth., Disp: 90 tablet, Rfl: 3 .  carvedilol (COREG) 3.125 MG tablet, Take 1 tablet (3.125 mg total) 2 (two) times daily with a meal by mouth., Disp: 180 tablet, Rfl: 3 .  Cholecalciferol (VITAMIN D3) 1000 UNITS tablet, Take 1,000 Units by mouth daily. , Disp: , Rfl:  .  Cyanocobalamin (VITAMIN B 12 PO), Take 500 mg by mouth daily., Disp: , Rfl:  .  fluticasone (FLONASE) 50 MCG/ACT nasal spray, Place 1 spray into both nostrils 2 (two) times daily., Disp: 16 g, Rfl: 6 .  losartan (COZAAR) 50 MG tablet, Take 1 tablet (50 mg total) daily by mouth., Disp: 90 tablet, Rfl: 3 .  Magnesium 250 MG TABS, Take 250 mg by mouth daily. , Disp: , Rfl:  .  nitroGLYCERIN (NITROSTAT) 0.4 MG SL tablet, Place 1 tablet (0.4 mg total) every 5 (five) minutes as needed under the tongue for chest pain (for chest pain)., Disp: 25 tablet, Rfl: 6 .  Omega-3 Fatty Acids (FISH OIL) 1000 MG CAPS, Take 1,000 mg by mouth 2 (two) times daily. , Disp: , Rfl:  .  Saw Palmetto 450 MG CAPS, Take 450 mg by mouth 2 (two) times daily.  , Disp: , Rfl:   Current Facility-Administered Medications:  .  0.9 %  sodium chloride infusion, 500 mL, Intravenous, Continuous, Milus Banister, MD   Allergies  Allergen Reactions  . Fluoxetine Hcl Other (See Comments)    nightmares  . Other Other (See Comments)    ANTI-DEPRESSANTS. ANTI-DEPRESSANTS Antidepressants...has a variety of reactions to different antidepressants  . Prozac [Fluoxetine Hcl] Other (See Comments)    nightmares  . Oxycodone Other (See Comments)    unknown   Review of Systems:   Pertinent items are noted in the HPI. Otherwise, ROS is negative.  Vitals:   Vitals:   09/10/17 1538  BP: 124/62  Pulse: 72  Temp: 99.3 F (37.4 C)  TempSrc: Oral  SpO2: 97%  Weight: 146 lb 12.8 oz (66.6 kg)     Body mass index is 24.43 kg/m.  Physical Exam:   Physical Exam  Constitutional: He is oriented to person, place, and time. He appears well-developed and well-nourished. No distress.  HENT:  Head: Normocephalic and atraumatic.  Right Ear: External ear normal.  Left Ear: External ear normal.  Nose: Nose normal.  Mouth/Throat: Oropharynx is clear and moist.  Eyes: Pupils are equal, round, and reactive to light. Conjunctivae and EOM are normal.  Neck: Normal range of motion. Neck supple.  Cardiovascular: Normal rate, regular rhythm, normal heart sounds and intact distal pulses.  Pulmonary/Chest: Effort normal. He has wheezes. He has rhonchi.  Abdominal: Soft. Bowel sounds are normal.  Musculoskeletal: Normal range of motion.  Neurological: He is alert and oriented to person, place, and time.  Skin: Skin is warm and dry.  Psychiatric: He has a normal mood and affect. His behavior is normal. Judgment and thought content normal.  Nursing note and vitals reviewed.  Results for orders placed or performed in visit on 08/27/17  Comprehensive metabolic panel  Result Value Ref Range   Sodium 141 135 - 145 mEq/L   Potassium 3.8 3.5 - 5.1 mEq/L   Chloride 106 96 -  112 mEq/L   CO2 30 19 - 32 mEq/L   Glucose, Bld 137 (H) 70 - 99 mg/dL   BUN 18 6 - 23 mg/dL   Creatinine, Ser 0.87 0.40 - 1.50 mg/dL   Total Bilirubin 1.1 0.2 - 1.2 mg/dL   Alkaline Phosphatase 61 39 - 117 U/L   AST 17 0 - 37 U/L   ALT 16 0 - 53 U/L   Total Protein 6.1 6.0 - 8.3 g/dL   Albumin 4.0 3.5 - 5.2 g/dL   Calcium 8.9 8.4 - 10.5 mg/dL   GFR 90.00 >60.00 mL/min   Assessment and Plan:   1. Cough - predniSONE (DELTASONE) 5 MG tablet; Take 1 tablet (5 mg total) by mouth daily with breakfast. 6-5-4-3-2-1  Dispense: 21 tablet; Refill: 0 - doxycycline (VIBRA-TABS) 100 MG tablet; Take 1 tablet (100 mg total) by mouth 2 (two) times daily.  Dispense: 20 tablet; Refill: 0  2. Other pancytopenia ([place])  . Reviewed expectations re: course of current medical issues. . Discussed self-management of symptoms. . Outlined signs and symptoms indicating need for more acute intervention. . Patient verbalized understanding and all questions were answered. Marland Kitchen Health Maintenance issues including appropriate healthy diet, exercise, and smoking avoidance were discussed with patient. . See orders for this visit as documented in the electronic medical record. . Patient received an After Visit Summary.  CMA served as Education administrator during this visit. History, Physical, and Plan performed by medical provider. The above documentation has been reviewed and is accurate and complete. Briscoe Deutscher, D.O.  Briscoe Deutscher, DO [place], [facility] 09/14/2017

## 2017-09-13 ENCOUNTER — Other Ambulatory Visit: Payer: Self-pay

## 2017-09-13 ENCOUNTER — Ambulatory Visit: Payer: Self-pay | Admitting: *Deleted

## 2017-09-13 MED ORDER — AMOXICILLIN-POT CLAVULANATE 875-125 MG PO TABS: 1.0000 | ORAL_TABLET | Freq: Two times a day (BID) | ORAL | 0 refills | Status: DC

## 2017-09-13 NOTE — Telephone Encounter (Signed)
See note

## 2017-09-13 NOTE — Telephone Encounter (Signed)
Pt was seen on 09-10-17 by dr [name]. Pt is still taking prednisone and doxycycline abx. Pt is still cough up yellow mucous . Pt just wants to make sure he is on right path. Pt has not check his fever  Patient is calling to check in before the week end. He is concerned that he still has color tt the congestion that he is coughing up- it is yellow. Encouraged plenty of fluids and continued medications, rest when he can and reviewed symptoms to watch for: fever, chest pain and wheezing. He understands and will continue his current treatment.  Reason for Disposition . [1] Follow-up call from patient regarding patient's clinical status AND [2] information NON-URGENT  Answer Assessment - Initial Assessment Questions 1. REASON FOR CALL or QUESTION: "What is your reason for calling today?" or "How can I best help you?" or "What question do you have that I can help answer?"     Patient wants to let provider know status before week end. 2. CALLER: Document the source of call. (e.g., laboratory, patient).     patient  Protocols used: PCP CALL - NO TRIAGE-A-AH

## 2017-09-13 NOTE — Telephone Encounter (Signed)
FYI

## 2017-09-13 NOTE — Telephone Encounter (Signed)
Called patient gave information will call if any problems. Had repeat back to me.

## 2017-09-13 NOTE — Telephone Encounter (Signed)
Sounds like he should be okay BUT okay to call in Augmentin 875 mg po BID x 7 days to alter coverage. He would stop the Doxycycline if he starts the Augmentin.

## 2017-09-17 ENCOUNTER — Telehealth: Payer: Self-pay | Admitting: Family Medicine

## 2017-09-17 NOTE — Telephone Encounter (Signed)
See note.  Copied from [place] [id number]. Topic: General - Other >> Sep 17, 2017  3:08 PM Yvette Rack wrote: Reason for CRM: Pt called in requesting to speak with the nurse. Pt states he still has a cough and would like the nurse to call him to discuss.

## 2017-09-17 NOTE — Telephone Encounter (Signed)
Called patient not feeling any better. App made for tomorrow.

## 2017-09-18 ENCOUNTER — Ambulatory Visit (INDEPENDENT_AMBULATORY_CARE_PROVIDER_SITE_OTHER): Payer: Medicare Other

## 2017-09-18 ENCOUNTER — Encounter: Payer: Self-pay | Admitting: Family Medicine

## 2017-09-18 ENCOUNTER — Ambulatory Visit (INDEPENDENT_AMBULATORY_CARE_PROVIDER_SITE_OTHER): Payer: Medicare Other | Admitting: Family Medicine

## 2017-09-18 VITALS — BP 128/76 | HR 64 | Temp 98.1°F | Ht 65.0 in | Wt 144.0 lb

## 2017-09-18 DIAGNOSIS — D61818 Other pancytopenia: Secondary | ICD-10-CM | POA: Diagnosis not present

## 2017-09-18 DIAGNOSIS — R059 Cough, unspecified: Secondary | ICD-10-CM

## 2017-09-18 DIAGNOSIS — R05 Cough: Secondary | ICD-10-CM

## 2017-09-18 MED ORDER — LEVOFLOXACIN 500 MG PO TABS: 500.0000 mg | ORAL_TABLET | Freq: Every day | ORAL | 0 refills | Status: DC

## 2017-09-18 MED ORDER — PREDNISONE 5 MG PO TABS: 5.0000 mg | ORAL_TABLET | Freq: Every day | ORAL | 0 refills | Status: DC

## 2017-09-18 NOTE — Progress Notes (Signed)
[name] is a 77 y.o. male is here for follow up.  History of Present Illness:   HPI:   Patient with continued cough.  He has some minimal nasal congestion cough seems to be right sided to him and worse when he lies on his right side.  No new fevers.  No chest pain or wheezing.  There are no preventive care reminders to display for this patient. Depression screen [facility] 2/9 03/26/2017 08/17/2016 02/20/2016  Decreased Interest 0 0 0  Down, Depressed, Hopeless 0 0 0  PHQ - 2 Score 0 0 0   PMHx, SurgHx, SocialHx, FamHx, Medications, and Allergies were reviewed in the Visit Navigator and updated as appropriate.   Patient Active Problem List   Diagnosis Date Noted  . Other pancytopenia ([place]) 08/23/2017  . Chronic anticoagulation 02/20/2016  . Osteoarthritis of right knee 11/08/2015  . Primary osteoarthritis of right knee 10/20/2015  . Bradycardia 11/02/2012  . SVT (supraventricular tachycardia) ([place]) 11/02/2012  . Chest pain at rest 10/03/2012  . Chronic insomnia 08/07/2012  . Chronic ITP (idiopathic thrombocytopenia) (HCC) 06/14/2011  . Mycosis fungoides, stage 1 ([place]) 06/14/2011  . Gallstone 06/14/2011  . History of kidney stones 06/14/2011  . CAD (coronary artery disease) 03/21/2011  . BEN HTN HEART DISEASE WITHOUT HEART FAIL 09/08/2009  . PERIODIC LIMB MOVEMENT DISORDER 09/17/2008  . Obstructive sleep apnea 09/14/2008  . CORONARY ATHEROSCLEROSIS NATIVE CORONARY ARTERY 09/08/2008  . Cardiomyopathy, ischemic 05/16/2008  . Thrombocytopenia ([place]) 02/26/2008  . Essential hypertension 07/10/2007  . Coronary atherosclerosis 07/10/2007  . URI 07/10/2007  . NEPHROLITHIASIS, HX OF 07/10/2007  . Hyperlipidemia 11/18/2006  . ALLERGIC RHINITIS 11/18/2006  . DIVERTICULOSIS, COLON 11/18/2006  . BPH (benign prostatic hypertrophy) 11/18/2006  . Osteoarthrosis, unspecified whether generalized or localized, unspecified site 11/18/2006  . RENAL COLIC 25/36/6440   Social History    Tobacco Use  . Smoking status: Never Smoker  . Smokeless tobacco: Never Used  Substance Use Topics  . Alcohol use: No    Alcohol/week: 0.0 oz  . Drug use: No   Current Medications and Allergies:   .  acetaminophen (TYLENOL) 650 MG CR tablet, Take 650 mg by mouth 2 (two) times daily. , Disp: , Rfl:  .  ALPRAZolam (XANAX) 0.5 MG tablet, Take 1 tablet (0.5 mg total) by mouth 2 (two) times daily as needed for anxiety (for sleep)., Disp: 60 tablet, Rfl: 5 .  amoxicillin-clavulanate (AUGMENTIN) 875-125 MG tablet, Take 1 tablet by mouth 2 (two) times daily., Disp: 14 tablet, Rfl: 0 .  aspirin 81 MG tablet, Take 81 mg by mouth daily.  , Disp: , Rfl:  .  atorvastatin (LIPITOR) 20 MG tablet, Take 1 tablet (20 mg total) daily by mouth., Disp: 90 tablet, Rfl: 3 .  carvedilol (COREG) 3.125 MG tablet, Take 1 tablet (3.125 mg total) 2 (two) times daily with a meal by mouth., Disp: 180 tablet, Rfl: 3 .  Cholecalciferol (VITAMIN D3) 1000 UNITS tablet, Take 1,000 Units by mouth daily. , Disp: , Rfl:  .  Cyanocobalamin (VITAMIN B 12 PO), Take 500 mg by mouth daily., Disp: , Rfl:  .  doxycycline (VIBRA-TABS) 100 MG tablet, Take 1 tablet (100 mg total) by mouth 2 (two) times daily., Disp: 20 tablet, Rfl: 0 .  fluticasone (FLONASE) 50 MCG/ACT nasal spray, Place 1 spray into both nostrils 2 (two) times daily., Disp: 16 g, Rfl: 6 .  losartan (COZAAR) 50 MG tablet, Take 1 tablet (50 mg total) daily by mouth.,  Disp: 90 tablet, Rfl: 3 .  Magnesium 250 MG TABS, Take 250 mg by mouth daily. , Disp: , Rfl:  .  nitroGLYCERIN (NITROSTAT) 0.4 MG SL tablet, Place 1 tablet (0.4 mg total) every 5 (five) minutes as needed under the tongue for chest pain (for chest pain)., Disp: 25 tablet, Rfl: 6 .  Omega-3 Fatty Acids (FISH OIL) 1000 MG CAPS, Take 1,000 mg by mouth 2 (two) times daily. , Disp: , Rfl:  .  predniSONE (DELTASONE) 5 MG tablet, Take 1 tablet (5 mg total) by mouth daily with breakfast. 6-5-4-3-2-1, Disp: 21 tablet,  Rfl: 0 .  Saw Palmetto 450 MG CAPS, Take 450 mg by mouth 2 (two) times daily. , Disp: , Rfl:     Allergies  Allergen Reactions  . Fluoxetine Hcl Other (See Comments)    nightmares  . Other Other (See Comments)    ANTI-DEPRESSANTS. ANTI-DEPRESSANTS Antidepressants...has a variety of reactions to different antidepressants  . Prozac [Fluoxetine Hcl] Other (See Comments)    nightmares  . Oxycodone Other (See Comments)    unknown   Review of Systems   Pertinent items are noted in the HPI. Otherwise, ROS is negative.  Vitals:   Vitals:   09/18/17 1006  BP: 128/76  Pulse: 64  Temp: 98.1 F (36.7 C)  TempSrc: Oral  SpO2: 99%  Weight: 144 lb (65.3 kg)  Height: 5\' 5"  (1.651 m)     Body mass index is 23.96 kg/m.   Physical Exam:   Physical Exam  Constitutional: He is oriented to person, place, and time. He appears well-developed and well-nourished. No distress.  HENT:  Head: Normocephalic and atraumatic.  Right Ear: External ear normal.  Left Ear: External ear normal.  Nose: Nose normal.  Mouth/Throat: Oropharynx is clear and moist.  Eyes: Pupils are equal, round, and reactive to light. Conjunctivae and EOM are normal.  Neck: Normal range of motion. Neck supple.  Cardiovascular: Normal rate, regular rhythm, normal heart sounds and intact distal pulses.  Pulmonary/Chest: Effort normal. He has rhonchi.  Abdominal: Soft. Bowel sounds are normal.  Musculoskeletal: Normal range of motion.  Neurological: He is alert and oriented to person, place, and time.  Skin: Skin is warm and dry.  Psychiatric: He has a normal mood and affect. His behavior is normal. Judgment and thought content normal.  Nursing note and vitals reviewed.   Assessment and Plan:   [name] was seen today for nasal congestion.  Diagnoses and all orders for this visit:  Cough Comments: CXR pending. Will watch for read.  Orders: -     DG Chest 2 View -     levofloxacin (LEVAQUIN) 500 MG tablet; Take 1  tablet (500 mg total) by mouth daily. -     predniSONE (DELTASONE) 5 MG tablet; Take 1 tablet (5 mg total) by mouth daily with breakfast. 6-5-4-3-2-1  Other pancytopenia ([place])   . Reviewed expectations re: course of current medical issues. . Discussed self-management of symptoms. . Outlined signs and symptoms indicating need for more acute intervention. . Patient verbalized understanding and all questions were answered. Marland Kitchen Health Maintenance issues including appropriate healthy diet, exercise, and smoking avoidance were discussed with patient. . See orders for this visit as documented in the electronic medical record. . Patient received an After Visit Summary.  Briscoe Deutscher, DO [place], [facility] [place] 09/18/2017  Future Appointments  Date Time Provider Graham  09/23/2017 11:30 AM [facility] LAB [facility]  11/22/2017  3:00 PM [facility] LAB [facility]  [facility]  12/20/2017  1:00 PM Briscoe Deutscher, DO [facility]  02/21/2018  1:00 PM Briscoe Deutscher, DO [facility]  03/18/2018 11:30 AM [facility] LAB [facility]

## 2017-09-19 ENCOUNTER — Telehealth: Payer: Self-pay | Admitting: *Deleted

## 2017-09-19 ENCOUNTER — Other Ambulatory Visit: Payer: Self-pay

## 2017-09-19 DIAGNOSIS — R059 Cough, unspecified: Secondary | ICD-10-CM

## 2017-09-19 DIAGNOSIS — R05 Cough: Secondary | ICD-10-CM

## 2017-09-19 NOTE — Telephone Encounter (Signed)
Copied from [place] [id number]. Topic: Quick Communication - Rx Refill/Question >> Sep 18, 2017  4:28 PM Wynetta Emery, [place] C wrote: Pt says that he was seen today and prescribed levofloxacin (LEVAQUIN) 500 MG tablet. Pt says that he has had a heart attack before and have a few other concerns pt would like to be sure that medication is a good fit for him. Pt says that he read a few of the side effects.   Please assist pt further.  CB: [phone]

## 2017-09-19 NOTE — Telephone Encounter (Signed)
Called patient he is very worried about side effects. Told him to hold on new abx and finish ones given today is last day. Per x [place] notes he as agreed with pulmonology referral. I have put that in and he will call office to see when he can get in. Will call if any new questions.   Patent was told by person at [facility] when he called that Provider was in a room but they would give message and someone would call right back. His message was sent by pec at 4:28 Dr. [name] was not in office at that time. Was not received by our office until after 8 the next morning. Patient was very upset that he did not receive call back yesterday like he was told.

## 2017-09-20 ENCOUNTER — Other Ambulatory Visit: Payer: Self-pay | Admitting: Family Medicine

## 2017-09-20 ENCOUNTER — Encounter: Payer: Self-pay | Admitting: Family Medicine

## 2017-09-20 NOTE — Addendum Note (Signed)
Addended by: Truddie Crumble on: 09/20/2017 12:14 PM   Modules accepted: Orders

## 2017-09-23 ENCOUNTER — Other Ambulatory Visit (INDEPENDENT_AMBULATORY_CARE_PROVIDER_SITE_OTHER): Payer: Medicare Other

## 2017-09-23 DIAGNOSIS — C8400 Mycosis fungoides, unspecified site: Secondary | ICD-10-CM | POA: Diagnosis present

## 2017-09-23 DIAGNOSIS — D693 Immune thrombocytopenic purpura: Secondary | ICD-10-CM

## 2017-09-23 LAB — CBC WITH DIFFERENTIAL/PLATELET
BASOS ABS: 0 10*3/uL (ref 0.0–0.1)
BASOS PCT: 0 %
Eosinophils Absolute: 0.2 10*3/uL (ref 0.0–0.7)
Eosinophils Relative: 3 %
HCT: 48 % (ref 39.0–52.0)
Hemoglobin: 15.6 g/dL (ref 13.0–17.0)
Lymphocytes Relative: 23 %
Lymphs Abs: 1.7 10*3/uL (ref 0.7–4.0)
MCH: 33.2 pg (ref 26.0–34.0)
MCHC: 32.5 g/dL (ref 30.0–36.0)
MCV: 102.1 fL — ABNORMAL HIGH (ref 78.0–100.0)
MONO ABS: 0.5 10*3/uL (ref 0.1–1.0)
Monocytes Relative: 7 %
NEUTROS ABS: 4.9 10*3/uL (ref 1.7–7.7)
Neutrophils Relative %: 67 %
PLATELETS: 104 10*3/uL — AB (ref 150–400)
RBC: 4.7 MIL/uL (ref 4.22–5.81)
RDW: 12.8 % (ref 11.5–15.5)
WBC: 7.3 10*3/uL (ref 4.0–10.5)

## 2017-09-24 ENCOUNTER — Telehealth: Payer: Self-pay | Admitting: *Deleted

## 2017-09-24 NOTE — Telephone Encounter (Signed)
Pt called / informed "platelet count remians stable @104 ,000 " per Dr Beryle Beams. Stated thank you for calling.

## 2017-09-24 NOTE — Telephone Encounter (Signed)
-----   Message from Annia Belt, MD sent at 09/24/2017  2:28 PM EDT ----- Call pt: platelet count remians stable @104 ,000

## 2017-10-15 ENCOUNTER — Institutional Professional Consult (permissible substitution): Payer: Medicare Other | Admitting: Pulmonary Disease

## 2017-11-05 DIAGNOSIS — H40013 Open angle with borderline findings, low risk, bilateral: Secondary | ICD-10-CM | POA: Diagnosis not present

## 2017-11-22 ENCOUNTER — Other Ambulatory Visit (INDEPENDENT_AMBULATORY_CARE_PROVIDER_SITE_OTHER): Payer: Medicare Other

## 2017-11-22 DIAGNOSIS — R972 Elevated prostate specific antigen [PSA]: Secondary | ICD-10-CM

## 2017-11-22 LAB — PSA: PSA: 1.3 ng/mL (ref <=4.0)

## 2017-11-22 NOTE — Addendum Note (Signed)
Addended by: Kayren Eaves T on: 11/22/2017 02:42 PM   Modules accepted: Orders

## 2017-11-25 IMAGING — CR DG KNEE 1-2V PORT*R*
2 series · 2 of 2 positions shown · non-contrast
Comparison: None.

CLINICAL DATA: Right knee osteoarthritis. Status post
unicompartmental knee arthroplasty.

EXAM:
PORTABLE RIGHT KNEE - 1-2 VIEW

[AP]
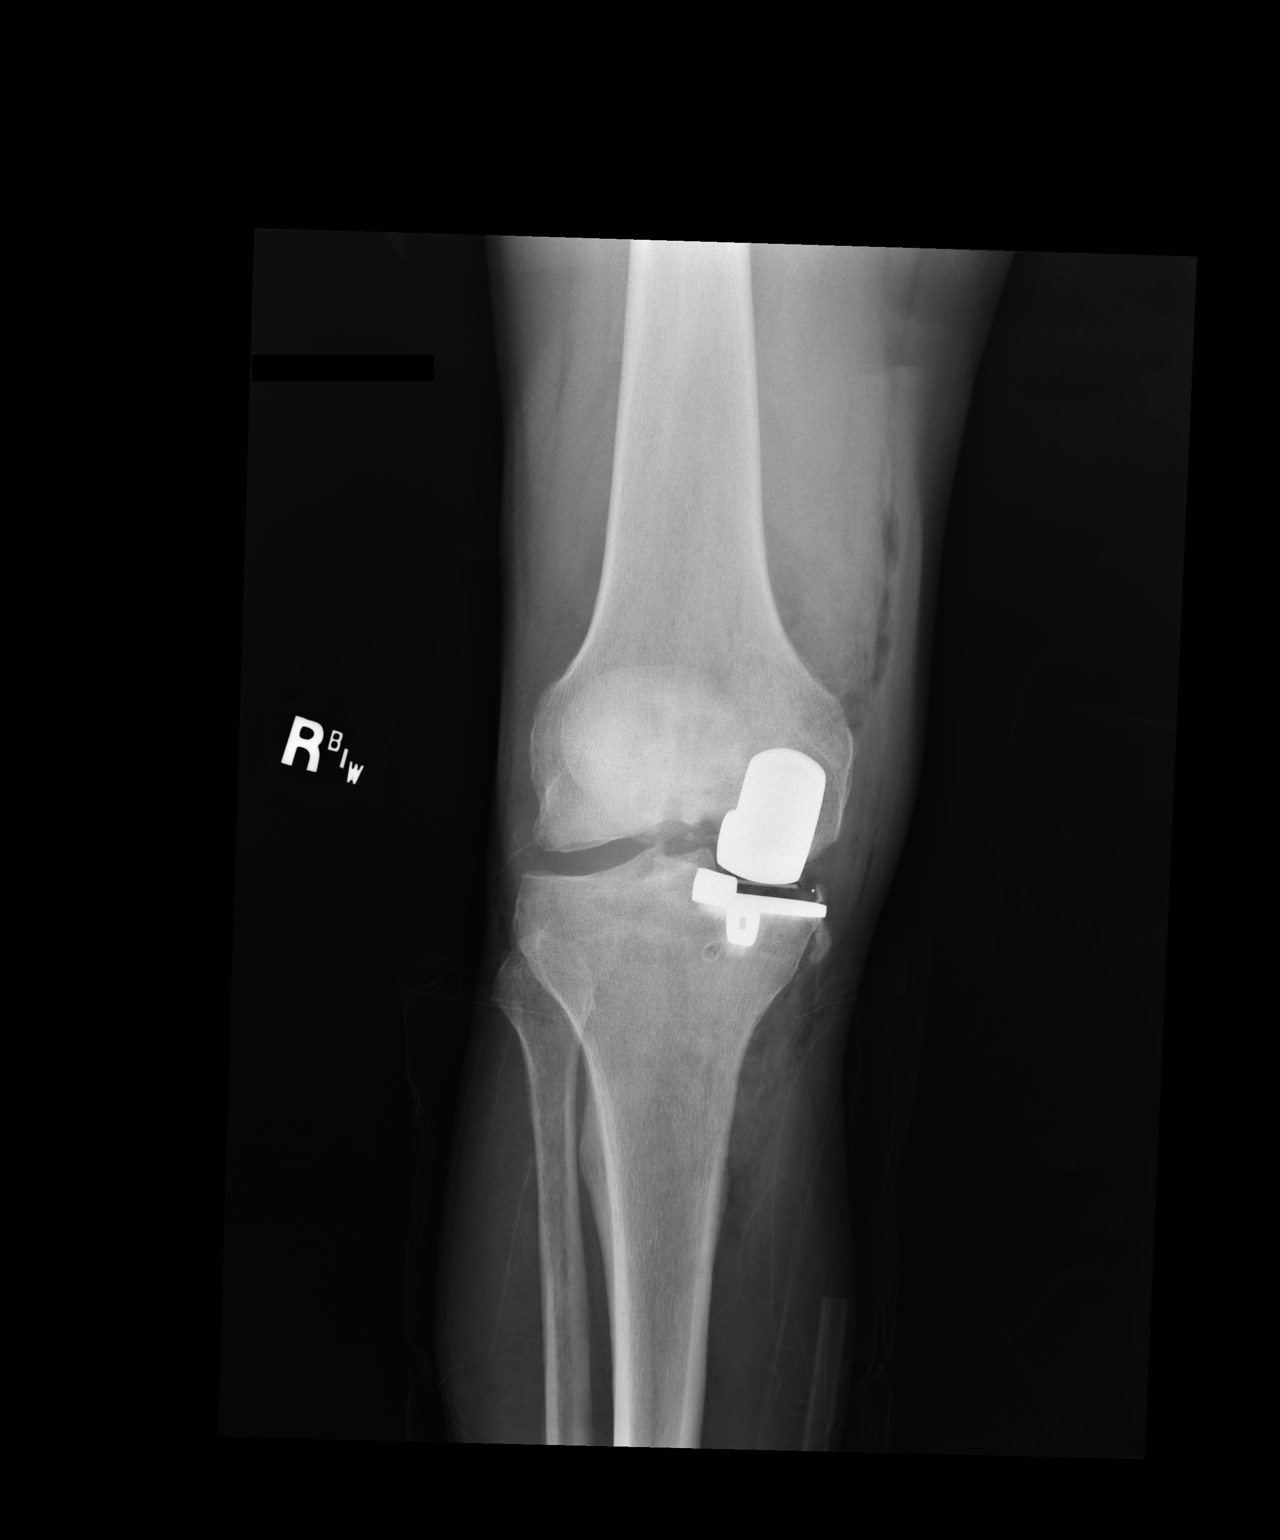

[xtable lateral]
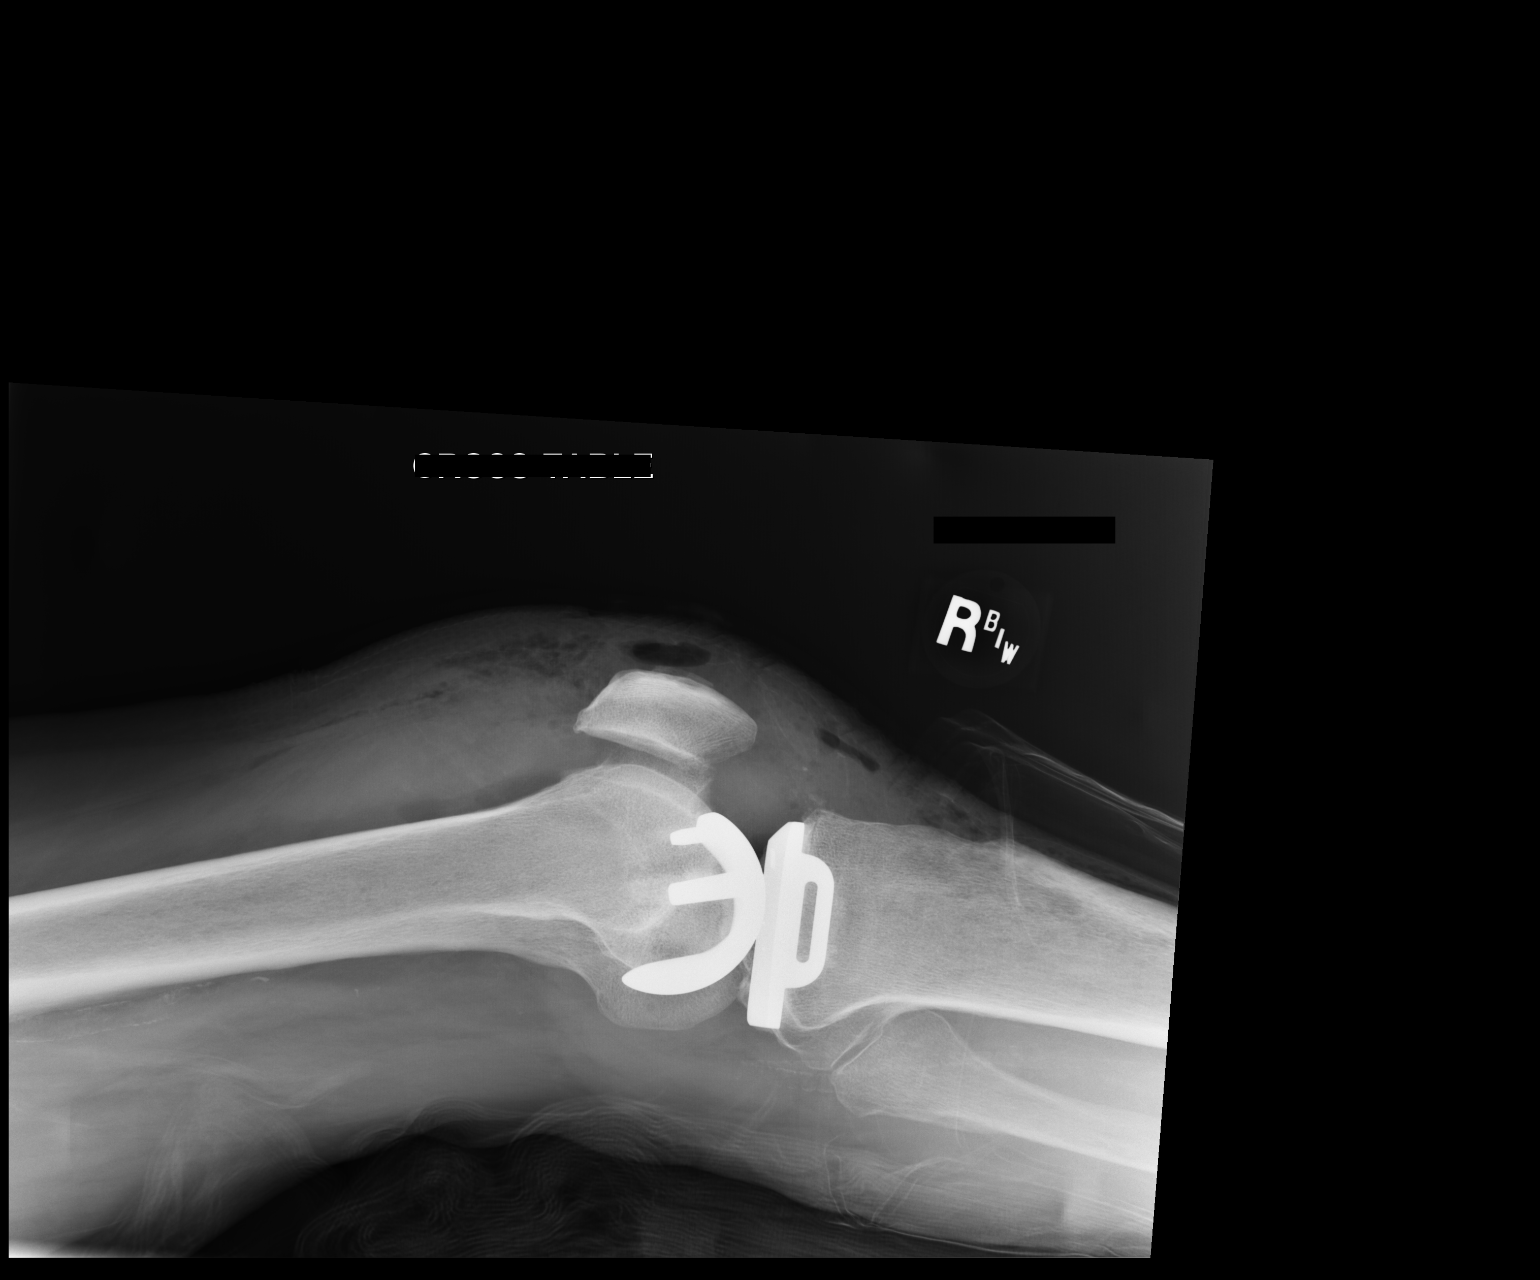

[2 of 2 positions shown; findings below may reference images not displayed]

FINDINGS: A medial unicompartmental arthroplasty is seen with both components
in expected position. No evidence of fracture or dislocation.

Postoperative knee joint effusion, soft tissue gas, and anterior
soft tissue swelling noted.
IMPRESSION: Expected postoperative appearance of medial unicompartmental
arthroplasty.
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
## 2017-12-20 ENCOUNTER — Ambulatory Visit (INDEPENDENT_AMBULATORY_CARE_PROVIDER_SITE_OTHER): Payer: Medicare Other | Admitting: Family Medicine

## 2017-12-20 ENCOUNTER — Encounter: Payer: Self-pay | Admitting: Family Medicine

## 2017-12-20 VITALS — BP 124/82 | HR 58 | Temp 98.2°F | Ht 65.0 in | Wt 147.6 lb

## 2017-12-20 DIAGNOSIS — E7800 Pure hypercholesterolemia, unspecified: Secondary | ICD-10-CM

## 2017-12-20 DIAGNOSIS — I2510 Atherosclerotic heart disease of native coronary artery without angina pectoris: Secondary | ICD-10-CM | POA: Diagnosis not present

## 2017-12-20 DIAGNOSIS — M150 Primary generalized (osteo)arthritis: Secondary | ICD-10-CM | POA: Diagnosis not present

## 2017-12-20 DIAGNOSIS — C8400 Mycosis fungoides, unspecified site: Secondary | ICD-10-CM

## 2017-12-20 DIAGNOSIS — R5381 Other malaise: Secondary | ICD-10-CM

## 2017-12-20 DIAGNOSIS — G4733 Obstructive sleep apnea (adult) (pediatric): Secondary | ICD-10-CM | POA: Diagnosis not present

## 2017-12-20 DIAGNOSIS — E538 Deficiency of other specified B group vitamins: Secondary | ICD-10-CM

## 2017-12-20 DIAGNOSIS — Z8572 Personal history of non-Hodgkin lymphomas: Secondary | ICD-10-CM

## 2017-12-20 DIAGNOSIS — J301 Allergic rhinitis due to pollen: Secondary | ICD-10-CM | POA: Diagnosis not present

## 2017-12-20 DIAGNOSIS — H903 Sensorineural hearing loss, bilateral: Secondary | ICD-10-CM

## 2017-12-20 DIAGNOSIS — K5730 Diverticulosis of large intestine without perforation or abscess without bleeding: Secondary | ICD-10-CM

## 2017-12-20 DIAGNOSIS — Z636 Dependent relative needing care at home: Secondary | ICD-10-CM

## 2017-12-20 DIAGNOSIS — I10 Essential (primary) hypertension: Secondary | ICD-10-CM

## 2017-12-20 DIAGNOSIS — N400 Enlarged prostate without lower urinary tract symptoms: Secondary | ICD-10-CM

## 2017-12-20 DIAGNOSIS — R5383 Other fatigue: Secondary | ICD-10-CM

## 2017-12-20 DIAGNOSIS — Z86010 Personal history of colonic polyps: Secondary | ICD-10-CM

## 2017-12-20 DIAGNOSIS — M159 Polyosteoarthritis, unspecified: Secondary | ICD-10-CM

## 2017-12-20 LAB — TSH: TSH: 1.74 u[IU]/mL (ref 0.35–4.50)

## 2017-12-20 LAB — VITAMIN B12: Vitamin B-12: 319 pg/mL (ref 211–911)

## 2017-12-21 ENCOUNTER — Encounter: Payer: Self-pay | Admitting: Family Medicine

## 2017-12-21 DIAGNOSIS — Z8572 Personal history of non-Hodgkin lymphomas: Secondary | ICD-10-CM | POA: Insufficient documentation

## 2017-12-21 DIAGNOSIS — H903 Sensorineural hearing loss, bilateral: Secondary | ICD-10-CM | POA: Insufficient documentation

## 2017-12-21 DIAGNOSIS — Z86010 Personal history of colonic polyps: Secondary | ICD-10-CM | POA: Insufficient documentation

## 2017-12-21 DIAGNOSIS — Z636 Dependent relative needing care at home: Secondary | ICD-10-CM | POA: Insufficient documentation

## 2017-12-21 LAB — IRON,TIBC AND FERRITIN PANEL
%SAT: 45 % (calc) (ref 20–48)
Ferritin: 63 ng/mL (ref 24–380)
Iron: 130 ug/dL (ref 50–180)
TIBC: 287 mcg/dL (calc) (ref 250–425)

## 2018-02-02 ENCOUNTER — Other Ambulatory Visit: Payer: Self-pay | Admitting: Family Medicine

## 2018-02-10 DIAGNOSIS — M25551 Pain in right hip: Secondary | ICD-10-CM | POA: Diagnosis not present

## 2018-02-21 ENCOUNTER — Ambulatory Visit (INDEPENDENT_AMBULATORY_CARE_PROVIDER_SITE_OTHER): Payer: Medicare Other | Admitting: Family Medicine

## 2018-02-21 ENCOUNTER — Encounter: Payer: Self-pay | Admitting: Family Medicine

## 2018-02-21 VITALS — BP 118/80 | HR 59 | Temp 97.7°F | Ht 65.0 in | Wt 147.2 lb

## 2018-02-21 DIAGNOSIS — E538 Deficiency of other specified B group vitamins: Secondary | ICD-10-CM | POA: Diagnosis not present

## 2018-02-21 DIAGNOSIS — R0789 Other chest pain: Secondary | ICD-10-CM

## 2018-02-21 DIAGNOSIS — D693 Immune thrombocytopenic purpura: Secondary | ICD-10-CM

## 2018-02-21 DIAGNOSIS — I10 Essential (primary) hypertension: Secondary | ICD-10-CM | POA: Diagnosis not present

## 2018-02-21 DIAGNOSIS — E7800 Pure hypercholesterolemia, unspecified: Secondary | ICD-10-CM

## 2018-02-21 DIAGNOSIS — Z636 Dependent relative needing care at home: Secondary | ICD-10-CM

## 2018-02-21 DIAGNOSIS — I2510 Atherosclerotic heart disease of native coronary artery without angina pectoris: Secondary | ICD-10-CM | POA: Diagnosis not present

## 2018-02-21 MED ORDER — CYANOCOBALAMIN 1000 MCG/ML IJ SOLN
1000.0000 ug | Freq: Once | INTRAMUSCULAR | Status: AC
  Administered 2018-02-21: 1000 ug via INTRAMUSCULAR

## 2018-02-21 NOTE — Progress Notes (Signed)
[name] is a 77 y.o. male is here for follow up.  History of Present Illness:   [name], CMA acting as scribe for Dr. Briscoe Deutscher.   HPI:   Hip pain: had injection a few weeks ago and is not having much improvement.   Trouble sleeping: Patient is still having trouble sleeping and general pain. He has been taking all medications prescribed.   Wife is still in SNF. Declining. He is trying to focus on resting and setting some boundaries with her.   There are no preventive care reminders to display for this patient.   Depression screen [facility] 2/9 03/26/2017 08/17/2016 02/20/2016  Decreased Interest 0 0 0  Down, Depressed, Hopeless 0 0 0  PHQ - 2 Score 0 0 0   PMHx, SurgHx, SocialHx, FamHx, Medications, and Allergies were reviewed in the Visit Navigator and updated as appropriate.   Patient Active Problem List   Diagnosis Date Noted  . Caregiver burden, of wife 12/21/2017  . Bilateral sensorineural hearing loss, followed by ENT and audiology 12/21/2017  . History of cutaneous T-cell lymphoma 12/21/2017  . History of adenomatous polyp of colon, removed by Dr. Ardis Hughs 4/13 12/21/2017  . Presbycusis of both ears 02/07/2016  . Primary osteoarthritis of right knee 10/20/2015  . SVT (supraventricular tachycardia) ([place]) 11/02/2012  . Chronic insomnia 08/07/2012  . History of MI (myocardial infarction) 07/24/2011  . History of SCC (squamous cell carcinoma) of skin 07/24/2011  . MDD (major depressive disorder) 07/24/2011  . Chronic ITP (idiopathic thrombocytopenia) (HCC) 06/14/2011  . Mycosis fungoides, stage 1 ([place]) 06/14/2011  . History of kidney stones 06/14/2011  . CAD (coronary artery disease) 03/21/2011  . Obstructive sleep apnea 09/14/2008  . Cardiomyopathy, ischemic 05/16/2008  . Essential hypertension, on Losartan, Coreg, ASA 07/10/2007  . Hyperlipidemia, on Lipitor 11/18/2006  . Allergic rhinitis, flonase prn 11/18/2006  . Diverticulosis of colon, last colonoscopy  4/18 11/18/2006  . Benign prostatic hyperplasia, uses saw palmetto 11/18/2006  . Osteoarthritis 11/18/2006   Social History   Tobacco Use  . Smoking status: Never Smoker  . Smokeless tobacco: Never Used  Substance Use Topics  . Alcohol use: No    Alcohol/week: 0.0 standard drinks  . Drug use: No   Current Medications and Allergies:   .  acetaminophen (TYLENOL) 650 MG CR tablet, Take 650 mg by mouth 2 (two) times daily. , Disp: , Rfl:  .  ALPRAZolam (XANAX) 0.5 MG tablet, Take 1 tablet (0.5 mg total) by mouth 2 (two) times daily as needed for anxiety (for sleep)., Disp: 60 tablet, Rfl: 5 .  aspirin 81 MG tablet, Take 81 mg by mouth daily.  , Disp: , Rfl:  .  atorvastatin (LIPITOR) 20 MG tablet, Take 1 tablet (20 mg total) daily by mouth., Disp: 90 tablet, Rfl: 3 .  carvedilol (COREG) 3.125 MG tablet, Take 1 tablet (3.125 mg total) 2 (two) times daily with a meal by mouth., Disp: 180 tablet, Rfl: 3 .  fluticasone (FLONASE) 50 MCG/ACT nasal spray, PLACE 1 SPRAY INTO BOTH NOSTRILS 2 (TWO) TIMES DAILY., Disp: 16 g, Rfl: 2 .  losartan (COZAAR) 50 MG tablet, Take 1 tablet (50 mg total) daily by mouth., Disp: 90 tablet, Rfl: 3 .  nitroGLYCERIN (NITROSTAT) 0.4 MG SL tablet, Place 1 tablet (0.4 mg total) every 5 (five) minutes as needed under the tongue for chest pain (for chest pain)., Disp: 25 tablet, Rfl: 6 .  Omega-3 Fatty Acids (FISH OIL) 1000 MG CAPS, Take  1,000 mg by mouth 2 (two) times daily. , Disp: , Rfl:  .  Saw Palmetto 450 MG CAPS, Take 450 mg by mouth 2 (two) times daily. , Disp: , Rfl:    Allergies  Allergen Reactions  . Other Other (See Comments)    ANTI-DEPRESSANTS. ANTI-DEPRESSANTS Antidepressants...has a variety of reactions to different antidepressants  . Prozac [Fluoxetine Hcl] Other (See Comments)    nightmares  . Amoxicillin   . Oxycodone Other (See Comments)    unknown   Review of Systems   Pertinent items are noted in the HPI. Otherwise, ROS is  negative.  Vitals:   Vitals:   02/21/18 1250  BP: 118/80  Pulse: (!) 59  Temp: 97.7 F (36.5 C)  TempSrc: Oral  SpO2: 97%  Weight: 147 lb 3.2 oz (66.8 kg)  Height: 5\' 5"  (1.651 m)     Body mass index is 24.5 kg/m.  Physical Exam:   Physical Exam  Constitutional: He is oriented to person, place, and time. He appears well-developed and well-nourished. No distress.  HENT:  Head: Normocephalic and atraumatic.  Right Ear: External ear normal.  Left Ear: External ear normal.  Nose: Nose normal.  Mouth/Throat: Oropharynx is clear and moist.  Eyes: Pupils are equal, round, and reactive to light. Conjunctivae and EOM are normal.  Neck: Normal range of motion. Neck supple.  Cardiovascular: Normal rate, regular rhythm, normal heart sounds and intact distal pulses.  Pulmonary/Chest: Effort normal and breath sounds normal.  Abdominal: Soft. Bowel sounds are normal.  Musculoskeletal: Normal range of motion.  Neurological: He is alert and oriented to person, place, and time.  Skin: Skin is warm and dry.  Psychiatric: He has a normal mood and affect. His behavior is normal. Judgment and thought content normal.  Nursing note and vitals reviewed.  EKG: unchanged from previous tracings.  Assessment and Plan:   Dossie was seen today for follow-up.  Diagnoses and all orders for this visit:  Pure hypercholesterolemia  B12 deficiency Comments: Mild. Injection today to see if it helps him feel more energetic. Orders: -     cyanocobalamin ((VITAMIN B-12)) injection 1,000 mcg  Caregiver burden, of wife Comments: In hospice now and progressing. Two daughter. Oldest very helpful. Youngest visiting q 2 weeks.   Chronic ITP (idiopathic thrombocytopenia) (HCC)  Essential hypertension, on Losartan, Coreg, ASA  Atypical chest pain Comments: Left arm "weakness" at times. No exertional symptoms.  Orders: -     EKG 12-Lead    . Reviewed expectations re: course of current medical  issues. . Discussed self-management of symptoms. . Outlined signs and symptoms indicating need for more acute intervention. . Patient verbalized understanding and all questions were answered. Marland Kitchen Health Maintenance issues including appropriate healthy diet, exercise, and smoking avoidance were discussed with patient. . See orders for this visit as documented in the electronic medical record. . Patient received an After Visit Summary.  CMA served as Education administrator during this visit. History, Physical, and Plan performed by medical provider. The above documentation has been reviewed and is accurate and complete. Briscoe Deutscher, D.O.  Briscoe Deutscher, DO [place] [place] [facility] 02/27/2018

## 2018-03-14 DIAGNOSIS — M25551 Pain in right hip: Secondary | ICD-10-CM | POA: Diagnosis not present

## 2018-03-18 ENCOUNTER — Other Ambulatory Visit (INDEPENDENT_AMBULATORY_CARE_PROVIDER_SITE_OTHER): Payer: Medicare Other

## 2018-03-18 DIAGNOSIS — D693 Immune thrombocytopenic purpura: Secondary | ICD-10-CM

## 2018-03-18 DIAGNOSIS — C8400 Mycosis fungoides, unspecified site: Secondary | ICD-10-CM

## 2018-03-18 LAB — CBC WITH DIFFERENTIAL/PLATELET
Abs Immature Granulocytes: 0.02 10*3/uL (ref 0.00–0.07)
Basophils Absolute: 0 10*3/uL (ref 0.0–0.1)
Basophils Relative: 1 %
EOS ABS: 0.1 10*3/uL (ref 0.0–0.5)
Eosinophils Relative: 2 %
HEMATOCRIT: 48.5 % (ref 39.0–52.0)
Hemoglobin: 16 g/dL (ref 13.0–17.0)
IMMATURE GRANULOCYTES: 0 %
LYMPHS ABS: 1.2 10*3/uL (ref 0.7–4.0)
Lymphocytes Relative: 23 %
MCH: 33.2 pg (ref 26.0–34.0)
MCHC: 33 g/dL (ref 30.0–36.0)
MCV: 100.6 fL — AB (ref 80.0–100.0)
MONOS PCT: 7 %
Monocytes Absolute: 0.4 10*3/uL (ref 0.1–1.0)
NEUTROS PCT: 67 %
Neutro Abs: 3.6 10*3/uL (ref 1.7–7.7)
Platelets: 113 10*3/uL — ABNORMAL LOW (ref 150–400)
RBC: 4.82 MIL/uL (ref 4.22–5.81)
RDW: 12.8 % (ref 11.5–15.5)
WBC: 5.3 10*3/uL (ref 4.0–10.5)
nRBC: 0 % (ref 0.0–0.2)

## 2018-03-19 LAB — LACTATE DEHYDROGENASE: LDH: 215 IU/L (ref 121–224)

## 2018-03-19 LAB — COMPREHENSIVE METABOLIC PANEL
A/G RATIO: 2.4 — AB (ref 1.2–2.2)
ALT: 20 IU/L (ref 0–44)
AST: 20 IU/L (ref 0–40)
Albumin: 4 g/dL (ref 3.5–4.8)
Alkaline Phosphatase: 73 IU/L (ref 39–117)
BUN/Creatinine Ratio: 18 (ref 10–24)
BUN: 15 mg/dL (ref 8–27)
Bilirubin Total: 1.2 mg/dL (ref 0.0–1.2)
CO2: 22 mmol/L (ref 20–29)
Calcium: 9.2 mg/dL (ref 8.6–10.2)
Chloride: 102 mmol/L (ref 96–106)
Creatinine, Ser: 0.85 mg/dL (ref 0.76–1.27)
GFR calc Af Amer: 96 mL/min/{1.73_m2} (ref >59)
GFR calc non Af Amer: 83 mL/min/{1.73_m2} (ref >59)
GLOBULIN, TOTAL: 1.7 g/dL (ref 1.5–4.5)
Glucose: 84 mg/dL (ref 65–99)
Potassium: 4.5 mmol/L (ref 3.5–5.2)
SODIUM: 140 mmol/L (ref 134–144)
Total Protein: 5.7 g/dL — ABNORMAL LOW (ref 6.0–8.5)

## 2018-03-20 ENCOUNTER — Telehealth: Payer: Self-pay | Admitting: *Deleted

## 2018-03-20 NOTE — Telephone Encounter (Signed)
-----   Message from Annia Belt, MD sent at 03/18/2018  6:15 PM EST ----- Call pt: platelets stable at 113,000

## 2018-03-20 NOTE — Telephone Encounter (Signed)
Pt called / informed "platelets stable at 113,000 " per Dr Rutherford Limerick. Stated he will see [place] next week.

## 2018-03-21 ENCOUNTER — Encounter: Payer: Self-pay | Admitting: Family Medicine

## 2018-03-21 DIAGNOSIS — E441 Mild protein-calorie malnutrition: Secondary | ICD-10-CM | POA: Insufficient documentation

## 2018-03-24 ENCOUNTER — Ambulatory Visit (INDEPENDENT_AMBULATORY_CARE_PROVIDER_SITE_OTHER): Payer: Medicare Other | Admitting: Oncology

## 2018-03-24 ENCOUNTER — Encounter: Payer: Self-pay | Admitting: Oncology

## 2018-03-24 ENCOUNTER — Encounter: Payer: Self-pay | Admitting: Cardiovascular Disease

## 2018-03-24 ENCOUNTER — Ambulatory Visit (INDEPENDENT_AMBULATORY_CARE_PROVIDER_SITE_OTHER): Payer: Medicare Other | Admitting: Cardiovascular Disease

## 2018-03-24 ENCOUNTER — Other Ambulatory Visit: Payer: Self-pay

## 2018-03-24 VITALS — BP 136/73 | HR 66 | Temp 97.6°F | Ht 65.0 in | Wt 150.7 lb

## 2018-03-24 VITALS — BP 118/60 | HR 59 | Ht 65.0 in | Wt 151.0 lb

## 2018-03-24 DIAGNOSIS — Z86010 Personal history of colonic polyps: Secondary | ICD-10-CM

## 2018-03-24 DIAGNOSIS — Z8572 Personal history of non-Hodgkin lymphomas: Secondary | ICD-10-CM

## 2018-03-24 DIAGNOSIS — L821 Other seborrheic keratosis: Secondary | ICD-10-CM

## 2018-03-24 DIAGNOSIS — Z955 Presence of coronary angioplasty implant and graft: Secondary | ICD-10-CM | POA: Diagnosis not present

## 2018-03-24 DIAGNOSIS — Z85828 Personal history of other malignant neoplasm of skin: Secondary | ICD-10-CM | POA: Diagnosis not present

## 2018-03-24 DIAGNOSIS — I10 Essential (primary) hypertension: Secondary | ICD-10-CM

## 2018-03-24 DIAGNOSIS — E7800 Pure hypercholesterolemia, unspecified: Secondary | ICD-10-CM

## 2018-03-24 DIAGNOSIS — Z7982 Long term (current) use of aspirin: Secondary | ICD-10-CM

## 2018-03-24 DIAGNOSIS — C8400 Mycosis fungoides, unspecified site: Secondary | ICD-10-CM

## 2018-03-24 DIAGNOSIS — E785 Hyperlipidemia, unspecified: Secondary | ICD-10-CM | POA: Diagnosis not present

## 2018-03-24 DIAGNOSIS — I252 Old myocardial infarction: Secondary | ICD-10-CM | POA: Diagnosis not present

## 2018-03-24 DIAGNOSIS — D693 Immune thrombocytopenic purpura: Secondary | ICD-10-CM

## 2018-03-24 DIAGNOSIS — I2510 Atherosclerotic heart disease of native coronary artery without angina pectoris: Secondary | ICD-10-CM

## 2018-03-24 DIAGNOSIS — Z881 Allergy status to other antibiotic agents status: Secondary | ICD-10-CM

## 2018-03-24 DIAGNOSIS — Z885 Allergy status to narcotic agent status: Secondary | ICD-10-CM | POA: Diagnosis not present

## 2018-03-24 DIAGNOSIS — Z888 Allergy status to other drugs, medicaments and biological substances status: Secondary | ICD-10-CM | POA: Diagnosis not present

## 2018-03-24 NOTE — Patient Instructions (Signed)
Medication Instructions:  Your physician recommends that you continue on your current medications as directed. Please refer to the Current Medication list given to you today.  If you need a refill on your cardiac medications before your next appointment, please call your pharmacy.   Lab work: none If you have labs (blood work) drawn today and your tests are completely normal, you will receive your results only by: . MyChart Message (if you have MyChart) OR . A paper copy in the mail If you have any lab test that is abnormal or we need to change your treatment, we will call you to review the results.  Testing/Procedures: Your physician has requested that you have an echocardiogram. Echocardiography is a painless test that uses sound waves to create images of your heart. It provides your doctor with information about the size and shape of your heart and how well your heart's chambers and valves are working. This procedure takes approximately one hour. There are no restrictions for this procedure.    Follow-Up: At [facility], you and your health needs are our priority.  As part of our continuing mission to provide you with exceptional heart care, we have created designated Provider Care Teams.  These Care Teams include your primary Cardiologist (physician) and Advanced Practice Providers (APPs -  Physician Assistants and Nurse Practitioners) who all work together to provide you with the care you need, when you need it. You will need a follow up appointment in 12 months.  Please call our office 2 months in advance to schedule this appointment.  You may see Christopher McAlhany, MD or one of the following Advanced Practice Providers on your designated Care Team:   Brittainy Simmons, PA-C Dayna Dunn, PA-C . Michele Lenze, PA-C  Any Other Special Instructions Will Be Listed Below (If Applicable).    

## 2018-03-24 NOTE — Progress Notes (Signed)
Chief Complaint  Patient presents with  . Follow-up    CAD   History of Present Illness: 77 yo male with history of CAD, HTN, hyperlipidemia, idiopathic thrombocytopenia and OSA here today for cardiac follow up. He had an anterior STEMI in January 2009 and had a drug eluting stent placed in the LAD. His ejection fraction improved from 25% to 40-50% following the first event. No evidence of AAA on abdominal aortic u/s December 2013. He has since been diagnosed with T-cell lymphoma but this is in remission. Nuclear stress test 2014 with scar in the anterior wall, apex and inferoapical wall with LVEF of 55%. No ischemic EKG changes. Cardiac cath May 2014 with mild to moderate CAD, no obstructive lesions.   He is here today for follow up. The patient denies any chest pain, dyspnea, palpitations, lower extremity edema, orthopnea, PND, dizziness, near syncope or syncope. He has chronic left arm weakness and aching. No change with exertion  Primary Care Physician: Briscoe Deutscher, DO  Past Medical History:  Diagnosis Date  . Anxiety   . Arthritis of multiple joints   . Basal cell carcinoma of right scalp   . Benign prostatic hypertrophy   . CAD (coronary artery disease)   . Chronic anticoagulation 02/20/2016  . Chronic lower back pain, disk disease   . Depression   . Diverticulosis of colon   . Dysrhythmia   . Factor VII deficiency ([place])    10/28/15: No history of factor VII deficiency per Dr. Beryle Beams  . History of blood transfusion 1941  . History of colon polyps   . Hyperlipidemia   . ITP (idiopathic thrombocytopenic purpura)   . Kidney stones x 2, both passed   . Myocardial infarction ([place]) 2009  . Seasonal allergies   . Squamous carcinoma left scalp   . T-cell lymphoma ([place])    "I'm in stage I; original site was on my left chest; had a place around my waist treated last summer; got a place on back of my left leg" (10/03/2012)    Past Surgical History:  Procedure Laterality Date   . CARDIAC CATHETERIZATION  10/03/2012  . CATARACT EXTRACTION W/ INTRAOCULAR LENS  IMPLANT, BILATERAL Bilateral 2000's  . COLONOSCOPY    . CORONARY ANGIOPLASTY WITH STENT PLACEMENT  05/29/2007   "1" (10/03/2012)  . CYST EXCISION  1980's?   "or fatty tumor; taken off my back" (10/03/2012)  . KNEE SURGERY     right  . LEFT HEART CATHETERIZATION WITH CORONARY ANGIOGRAM N/A 10/03/2012   Procedure: LEFT HEART CATHETERIZATION WITH CORONARY ANGIOGRAM;  Surgeon: Burnell Blanks, MD;  Location: [facility] CATH LAB;  Service: Cardiovascular;  Laterality: N/A;  . LUMBAR EPIDURAL INJECTION  ~ 2004  . MOHS SURGERY Bilateral 2011-2012   "top of my head" (10/03/2012)  . PARTIAL KNEE ARTHROPLASTY Right 11/08/2015   Procedure: RIGHT UNICOMPARTMENTAL KNEE;  Surgeon: Renette Butters, MD;  Location: [facility];  Service: Orthopedics;  Laterality: Right;  . TONSILLECTOMY  ~ 1945  . VASECTOMY      Current Outpatient Medications  Medication Sig Dispense Refill  . acetaminophen (TYLENOL) 650 MG CR tablet Take 650 mg by mouth 2 (two) times daily.     Marland Kitchen ALPRAZolam (XANAX) 0.5 MG tablet Take 1 tablet (0.5 mg total) by mouth 2 (two) times daily as needed for anxiety (for sleep). 60 tablet 5  . aspirin 81 MG tablet Take 81 mg by mouth daily.      Marland Kitchen atorvastatin (LIPITOR) 20 MG tablet  Take 1 tablet (20 mg total) daily by mouth. 90 tablet 3  . carvedilol (COREG) 3.125 MG tablet Take 1 tablet (3.125 mg total) 2 (two) times daily with a meal by mouth. 180 tablet 3  . fluticasone (FLONASE) 50 MCG/ACT nasal spray PLACE 1 SPRAY INTO BOTH NOSTRILS 2 (TWO) TIMES DAILY. 16 g 2  . losartan (COZAAR) 50 MG tablet Take 1 tablet (50 mg total) daily by mouth. 90 tablet 3  . Magnesium 250 MG TABS Take 1 tablet by mouth daily.    . nitroGLYCERIN (NITROSTAT) 0.4 MG SL tablet Place 1 tablet (0.4 mg total) every 5 (five) minutes as needed under the tongue for chest pain (for chest pain). 25 tablet 6  . Omega-3 Fatty Acids (FISH OIL) 1000 MG CAPS  Take 1,000 mg by mouth 2 (two) times daily.     . Saw Palmetto 450 MG CAPS Take 450 mg by mouth 2 (two) times daily.     Marland Kitchen VITAMIN D PO Take 1,000 mg by mouth daily.     No current facility-administered medications for this visit.     Allergies  Allergen Reactions  . Other Other (See Comments)    ANTI-DEPRESSANTS. ANTI-DEPRESSANTS Antidepressants...has a variety of reactions to different antidepressants  . Prozac [Fluoxetine Hcl] Other (See Comments)    nightmares  . Amoxicillin   . Oxycodone Other (See Comments)    unknown    Social History   Socioeconomic History  . Marital status: Married    Spouse name: Not on file  . Number of children: 2  . Years of education: Not on file  . Highest education level: Not on file  Occupational History  . Occupation: Retired  Scientific laboratory technician  . Financial resource strain: Not on file  . Food insecurity:    Worry: Not on file    Inability: Not on file  . Transportation needs:    Medical: Not on file    Non-medical: Not on file  Tobacco Use  . Smoking status: Never Smoker  . Smokeless tobacco: Never Used  Substance and Sexual Activity  . Alcohol use: No    Alcohol/week: 0.0 standard drinks  . Drug use: No  . Sexual activity: Not Currently  Lifestyle  . Physical activity:    Days per week: Not on file    Minutes per session: Not on file  . Stress: Not on file  Relationships  . Social connections:    Talks on phone: Not on file    Gets together: Not on file    Attends religious service: Not on file    Active member of club or organization: Not on file    Attends meetings of clubs or organizations: Not on file    Relationship status: Not on file  . Intimate partner violence:    Fear of current or ex partner: Not on file    Emotionally abused: Not on file    Physically abused: Not on file    Forced sexual activity: Not on file  Other Topics Concern  . Not on file  Social History Narrative  . Not on file    Family History    Problem Relation Age of Onset  . Alzheimer's disease Mother   . Lymphoma Father   . Coronary artery disease Brother   . COPD Brother   . Diabetes Brother   . Colon cancer Paternal Uncle   . Diabetes Brother     Review of Systems:  As stated in the HPI  and otherwise negative.   BP 118/60   Pulse (!) 59   Ht 5\' 5"  (1.651 m)   Wt 151 lb (68.5 kg)   SpO2 94%   BMI 25.13 kg/m   Physical Examination:  General: Well developed, well nourished, NAD  HEENT: OP clear, mucus membranes moist  SKIN: warm, dry. No rashes. Neuro: No focal deficits  Musculoskeletal: Muscle strength 5/5 all ext  Psychiatric: Mood and affect normal  Neck: No JVD, no carotid bruits, no thyromegaly, no lymphadenopathy.  Lungs:Clear bilaterally, no wheezes, rhonci, crackles Cardiovascular: Regular rate and rhythm. No murmurs, gallops or rubs. Abdomen:Soft. Bowel sounds present. Non-tender.  Extremities: No lower extremity edema. Pulses are 2 + in the bilateral DP/PT.  Cardiac cath may 2014: Left main: No obstructive disease.  Left Anterior Descending Artery: Large caliber vessel that courses to the apex. The proximal vessel has mild plaque disease. The mid stented segment is patent. The small caliber diagonal branch is patent with ostial 70% stenosis as it is jailed by the stent and unchanged in appearance.  Circumflex Artery: Moderate caliber vessel with moderate caliber obtuse marginal branch with no significant disease. The AV groove Circumflex has a 40% stenosis just after the takeoff of the OM branch, unchanged.  Right Coronary Artery: Large, dominant vessel with serial 20% lesions throughout the proximal vessel. The mid vessel has a 40% stenosis. The distal vessel has mild plaque disease.  Left Ventricular Angiogram: LVEF 55% with anterior and apical hypokinesis.   EKG:  EKG is not ordered today. The ekg ordered today demonstrates   Recent Labs: 12/20/2017: TSH 1.74 03/18/2018: ALT 20; BUN 15;  Creatinine, Ser 0.85; Hemoglobin 16.0; Platelets 113; Potassium 4.5; Sodium 140   Lipid Panel Lipid Panel     Component Value Date/Time   CHOL 125 08/21/2017 0901   TRIG 41.0 08/21/2017 0901   HDL 63.10 08/21/2017 0901   CHOLHDL 2 08/21/2017 0901   VLDL 8.2 08/21/2017 0901   LDLCALC 54 08/21/2017 0901   LDLDIRECT 137.3 08/08/2006 1059      Wt Readings from Last 3 Encounters:  03/24/18 151 lb (68.5 kg)  03/24/18 150 lb 11.2 oz (68.4 kg)  02/21/18 147 lb 3.2 oz (66.8 kg)     Other studies Reviewed: Additional studies/ records that were reviewed today include: . Review of the above records demonstrates:   Assessment and Plan:   1. CAD without angina: No chest pain. Continue ASA, statin and beta blocker.   Will arrange echo to assess LVEF.   2. HTN: BP is well controlled. No changes today  3. Hyperlipidemia: Lipids well controlled. Continue statin.    Current medicines are reviewed at length with the patient today.  The patient does not have concerns regarding medicines.  The following changes have been made:  no change  Labs/ tests ordered today include:   Orders Placed This Encounter  Procedures  . ECHOCARDIOGRAM COMPLETE    Disposition:   FU with me in 12 months  Signed, Lauree Chandler, MD 03/24/2018 2:42 PM    [facility] [place], [place], [place]  [address] Phone: [phone]; Fax: [phone]

## 2018-03-24 NOTE — Patient Instructions (Signed)
Return as needed before March, 2020  We will make a referral to a Hematologist at the [facility] for your long term Hematology care. They will call you for an appointment after the new year.  It has been a pleasure working with you.

## 2018-03-25 NOTE — Progress Notes (Signed)
Hematology and Oncology Follow Up Visit  [name] [id number] [birth year] 77 y.o. 03/25/2018 6:57 PM   Principle Diagnosis: Encounter Diagnoses  Name Primary?  . Chronic ITP (idiopathic thrombocytopenia) (HCC) Yes  . Mycosis fungoides, stage 1 (HCC)   Clinical summary: 77 year old man initially diagnosed with ITP in November 2009. Platelet counts have been consistently over 100,000 until 2016 when platelets dropped to 72,000. Repeat value one week later 88,000. Platelet count rebounded to his prior baseline and was 100,000 on 03/02/2015 and 109,000 on 08/29/2015 and have remained stable since then. He has never required any specific treatment. He developed a patchy rash over a 4 cm area, left pectoral region of his chest, which was there for about 7 years. He changed dermatologists. He was evaluated by Dr. Jari Pigg. 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 He was referred to Dr. Adelene Idler at [facility] Repeat biopsies were confirmatory. He was treated with topical clobetasol ointment with complete regression of the rash. He has had no recurrent areas of involvement. I obtained baseline CAT scans in August of 2012 and there was no organomegaly or lymphadenopathy. However, a lesion was seen on his scalp which was subsequently excised on 05/31/2011 and showed poorly differentiated squamous cell carcinoma. This appeared to be a localized process and not a metastasis. He has a history of a tubular adenoma removed at time of a colonoscopy 09/04/2011 by Dr. Oretha Caprice. He has coronary artery disease status post MI in 2009 status post LAD stent.  He has ongoing follow-up with cardiology, Dr. Angelena Form, and was just seen last week.  No new medication changes.  Interim History: He is personally doing well but under a great deal of stress.  His wife has developed dementia.  She has had a number of falls resulting in fractures of her  hips and arms.  She was in a nursing facility that was suboptimal.  Now moved to the [facility] where her care is much better.  His platelet count has been very stable over time. He has not developed any new cutaneous lesions. Previous history of MI.  No current cardiorespiratory symptoms.  He is worried that the stress of taking care of his wife may exacerbate his cardiac condition.  Medications: reviewed  Allergies:  Allergies  Allergen Reactions  . Other Other (See Comments)    ANTI-DEPRESSANTS. ANTI-DEPRESSANTS Antidepressants...has a variety of reactions to different antidepressants  . Prozac [Fluoxetine Hcl] Other (See Comments)    nightmares  . Amoxicillin   . Oxycodone Other (See Comments)    unknown    Review of Systems: See interim history. Remaining ROS negative:   Physical Exam: Blood pressure 136/73, pulse 66, temperature 97.6 F (36.4 C), temperature source Oral, height 5\' 5"  (1.651 m), weight 150 lb 11.2 oz (68.4 kg), SpO2 100 %. Wt Readings from Last 3 Encounters:  03/24/18 151 lb (68.5 kg)  03/24/18 150 lb 11.2 oz (68.4 kg)  02/21/18 147 lb 3.2 oz (66.8 kg)     General appearance: Thin well-nourished Caucasian man HENNT: Pharynx no erythema, exudate, mass, or ulcer. No thyromegaly or thyroid nodules Lymph nodes: No cervical, supraclavicular, or axillary lymphadenopathy Breasts: Lungs: Clear to auscultation, resonant to percussion throughout Heart: Regular rhythm, no murmur, no gallop, no rub, no click, no edema Abdomen: Soft, nontender, normal bowel sounds, no mass, no organomegaly Extremities: No edema, no calf tenderness Musculoskeletal: no joint deformities GU:  Vascular: Carotid  pulses 2+, no bruits, Neurologic: Alert, oriented, PERRLA,, cranial nerves grossly normal, motor strength 5 over 5, reflexes 1+ symmetric, upper body coordination normal, gait normal, Skin: No rash or ecchymosis.  No signs of local recurrence of cutaneous lymphoma on the  left chest wall.  No other skin lesions except a seborrheic keratosis on his right cheek.  Lab Results: CBC W/Diff    Component Value Date/Time   WBC 5.3 03/18/2018 1126   RBC 4.82 03/18/2018 1126   HGB 16.0 03/18/2018 1126   HGB 14.6 03/23/2013 1424   HCT 48.5 03/18/2018 1126   HCT 45.4 03/23/2013 1424   PLT 113 (L) 03/18/2018 1126   PLT 108 (L) 03/23/2013 1424   MCV 100.6 (H) 03/18/2018 1126   MCV 100.0 (H) 03/23/2013 1424   MCH 33.2 03/18/2018 1126   MCHC 33.0 03/18/2018 1126   RDW 12.8 03/18/2018 1126   RDW 12.7 03/23/2013 1424   LYMPHSABS 1.2 03/18/2018 1126   LYMPHSABS 1.1 03/23/2013 1424   MONOABS 0.4 03/18/2018 1126   MONOABS 0.4 03/23/2013 1424   EOSABS 0.1 03/18/2018 1126   EOSABS 0.1 03/23/2013 1424   BASOSABS 0.0 03/18/2018 1126   BASOSABS 0.0 03/23/2013 1424     Chemistry      Component Value Date/Time   NA 140 03/18/2018 1126   NA 142 03/23/2013 1424   K 4.5 03/18/2018 1126   K 4.7 03/23/2013 1424   CL 102 03/18/2018 1126   CL 106 09/22/2012 1428   CO2 22 03/18/2018 1126   CO2 28 03/23/2013 1424   BUN 15 03/18/2018 1126   BUN 18.1 03/23/2013 1424   CREATININE 0.85 03/18/2018 1126   CREATININE 0.85 10/25/2014 1040   CREATININE 0.8 03/23/2013 1424      Component Value Date/Time   CALCIUM 9.2 03/18/2018 1126   CALCIUM 9.4 03/23/2013 1424   ALKPHOS 73 03/18/2018 1126   ALKPHOS 62 03/23/2013 1424   AST 20 03/18/2018 1126   AST 25 03/23/2013 1424   ALT 20 03/18/2018 1126   ALT 27 03/23/2013 1424   BILITOT 1.2 03/18/2018 1126   BILITOT 0.86 03/23/2013 1424       Radiological Studies: No results found.  Impression:  1.  Mild chronic ITP.  Possibly paraneoplastic manifestation of cutaneous T-cell lymphoma. No specific treatment ever needed.  Continue to monitor platelet counts twice yearly.  2.  Localized stage I mycosis fungoides skin of the left pectoral region treated with topical betamethasone with no recurrence and no new lesions.  3.   Poorly differentiated squamous cell carcinoma of the scalp treated with excision.  4.  Adenomatous polyps of the colon.  5.  Coronary artery disease with previous MI and coronary stent placement.  6.  Essential hypertension  7.  Hyperlipidemia  I will transition his hematology care to 1 of the hematologist at the [facility] after the new year.  CC: Patient Care Team: Briscoe Deutscher, DO as PCP - General (Family Medicine) Burnell Blanks, MD as PCP - Cardiology (Cardiology) Annia Belt, MD as Consulting Physician (Oncology) Burnell Blanks, MD as Consulting Physician (Cardiology)   Murriel Hopper, MD, [place]  Hematology-Oncology/Internal Medicine     11/12/20196:57 PM

## 2018-04-01 ENCOUNTER — Ambulatory Visit (HOSPITAL_COMMUNITY): Payer: Medicare Other | Attending: Cardiology

## 2018-04-01 ENCOUNTER — Other Ambulatory Visit: Payer: Self-pay

## 2018-04-01 DIAGNOSIS — I2510 Atherosclerotic heart disease of native coronary artery without angina pectoris: Secondary | ICD-10-CM | POA: Diagnosis not present

## 2018-04-03 ENCOUNTER — Other Ambulatory Visit: Payer: Self-pay

## 2018-04-12 DIAGNOSIS — I2510 Atherosclerotic heart disease of native coronary artery without angina pectoris: Secondary | ICD-10-CM

## 2018-04-12 DIAGNOSIS — E7800 Pure hypercholesterolemia, unspecified: Secondary | ICD-10-CM

## 2018-04-14 MED ORDER — NITROGLYCERIN 0.4 MG SL SUBL: 0.4000 mg | SUBLINGUAL_TABLET | SUBLINGUAL | 6 refills | Status: DC | PRN

## 2018-04-14 MED ORDER — LOSARTAN POTASSIUM 50 MG PO TABS: 50.0000 mg | ORAL_TABLET | Freq: Every day | ORAL | 3 refills | Status: DC

## 2018-04-14 MED ORDER — ATORVASTATIN CALCIUM 20 MG PO TABS: 20.0000 mg | ORAL_TABLET | Freq: Every day | ORAL | 3 refills | Status: DC

## 2018-04-14 MED ORDER — CARVEDILOL 3.125 MG PO TABS: 3.1250 mg | ORAL_TABLET | Freq: Two times a day (BID) | ORAL | 3 refills | Status: DC

## 2018-04-30 DIAGNOSIS — M25551 Pain in right hip: Secondary | ICD-10-CM | POA: Diagnosis not present

## 2018-05-13 ENCOUNTER — Other Ambulatory Visit: Payer: Self-pay | Admitting: Oncology

## 2018-05-13 DIAGNOSIS — D693 Immune thrombocytopenic purpura: Secondary | ICD-10-CM

## 2018-05-13 DIAGNOSIS — C8400 Mycosis fungoides, unspecified site: Secondary | ICD-10-CM

## 2018-05-22 DIAGNOSIS — Z23 Encounter for immunization: Secondary | ICD-10-CM | POA: Diagnosis not present

## 2018-05-22 DIAGNOSIS — Z85828 Personal history of other malignant neoplasm of skin: Secondary | ICD-10-CM | POA: Diagnosis not present

## 2018-05-22 DIAGNOSIS — B353 Tinea pedis: Secondary | ICD-10-CM | POA: Diagnosis not present

## 2018-05-22 DIAGNOSIS — D2271 Melanocytic nevi of right lower limb, including hip: Secondary | ICD-10-CM | POA: Diagnosis not present

## 2018-05-22 DIAGNOSIS — L821 Other seborrheic keratosis: Secondary | ICD-10-CM | POA: Diagnosis not present

## 2018-05-25 NOTE — Progress Notes (Signed)
[name] is a 78 y.o. male is here for follow up.  History of Present Illness:   I,Keryn Nessler Designer, industrial/product as a Education administrator for [employer], DO.,have documented all relevant documentation on the behalf of Johnathan Deutscher, DO,as directed by  Johnathan Deutscher, DO while in the presence of Johnathan Deutscher, DO. HPI:   Pure hypercholesterolemia Is the patient taking medications without problems? Yes. Does the patient complain of muscle aches?  Yes. Pt stated that this could be coming from arthritis pt is not sure. Trying to exercise on a regular basis? No. Compliant with diet? No.  Lab Results  Component Value Date   CHOL 125 08/21/2017   HDL 63.10 08/21/2017   LDLCALC 54 08/21/2017   LDLDIRECT 137.3 08/08/2006   TRIG 41.0 08/21/2017   CHOLHDL 2 08/21/2017   Lab Results  Component Value Date   ALT 15 05/26/2018   AST 17 05/26/2018   ALKPHOS 61 05/26/2018   BILITOT 0.9 05/26/2018     B12 deficiency Patient receiving B-12 injections. Last labs were done 12-20-17 B-12 was 319.   Caregiver burden, of wife In hospice now and progressing. Two daughters. Oldest very helpful. Youngest visiting q 2 weeks.  There are no preventive care reminders to display for this patient. Depression screen [facility] 2/9 03/24/2018 03/26/2017 08/17/2016  Decreased Interest 0 0 0  Down, Depressed, Hopeless 1 0 0  PHQ - 2 Score 1 0 0   PMHx, SurgHx, SocialHx, FamHx, Medications, and Allergies were reviewed in the Visit Navigator and updated as appropriate.   Patient Active Problem List   Diagnosis Date Noted  . Malnutrition of mild degree ([place]) 03/21/2018  . Caregiver burden, of wife 12/21/2017  . Bilateral sensorineural hearing loss, followed by ENT and audiology 12/21/2017  . History of cutaneous T-cell lymphoma 12/21/2017  . History of adenomatous polyp of colon, removed by Dr. Ardis Hughs 4/13 12/21/2017  . Presbycusis of both ears 02/07/2016  . Primary osteoarthritis of right knee 10/20/2015  . SVT (supraventricular  tachycardia) ([place]) 11/02/2012  . Chronic insomnia 08/07/2012  . History of MI (myocardial infarction) 07/24/2011  . History of SCC (squamous cell carcinoma) of skin 07/24/2011  . MDD (major depressive disorder) 07/24/2011  . Chronic ITP (idiopathic thrombocytopenia) (HCC) 06/14/2011  . Mycosis fungoides, stage 1 ([place]) 06/14/2011  . History of kidney stones 06/14/2011  . CAD (coronary artery disease) 03/21/2011  . Obstructive sleep apnea 09/14/2008  . Cardiomyopathy, ischemic 05/16/2008  . Essential hypertension, on Losartan, Coreg, ASA 07/10/2007  . Hyperlipidemia, on Lipitor 11/18/2006  . Allergic rhinitis, flonase prn 11/18/2006  . Diverticulosis of colon, last colonoscopy 4/18 11/18/2006  . Benign prostatic hyperplasia, uses saw palmetto 11/18/2006  . Osteoarthritis 11/18/2006   Social History   Tobacco Use  . Smoking status: Never Smoker  . Smokeless tobacco: Never Used  Substance Use Topics  . Alcohol use: No    Alcohol/week: 0.0 standard drinks  . Drug use: No   Current Medications and Allergies:   .  acetaminophen (TYLENOL) 650 MG CR tablet, Take 650 mg by mouth 2 (two) times daily. , Disp: , Rfl:  .  ALPRAZolam (XANAX) 0.5 MG tablet, Take 1 tablet (0.5 mg total) by mouth 2 (two) times daily as needed for anxiety (for sleep)., Disp: 60 tablet, Rfl: 5 .  aspirin 81 MG tablet, Take 81 mg by mouth daily.  , Disp: , Rfl:  .  atorvastatin (LIPITOR) 20 MG tablet, Take 1 tablet (20 mg total) by mouth daily., Disp:  90 tablet, Rfl: 3 .  carvedilol (COREG) 3.125 MG tablet, Take 1 tablet (3.125 mg total) by mouth 2 (two) times daily with a meal., Disp: 180 tablet, Rfl: 3 .  fluticasone (FLONASE) 50 MCG/ACT nasal spray, PLACE 1 SPRAY INTO BOTH NOSTRILS 2 (TWO) TIMES DAILY., Disp: 16 g, Rfl: 2 .  losartan (COZAAR) 50 MG tablet, Take 1 tablet (50 mg total) by mouth daily., Disp: 90 tablet, Rfl: 3 .  Magnesium 250 MG TABS, Take 1 tablet by mouth daily., Disp: , Rfl:  .  nitroGLYCERIN  (NITROSTAT) 0.4 MG SL tablet, Place 1 tablet (0.4 mg total) under the tongue every 5 (five) minutes as needed for chest pain (for chest pain)., Disp: 25 tablet, Rfl: 6 .  Omega-3 Fatty Acids (FISH OIL) 1000 MG CAPS, Take 1,000 mg by mouth 2 (two) times daily. , Disp: , Rfl:  .  Saw Palmetto 450 MG CAPS, Take 450 mg by mouth 2 (two) times daily. , Disp: , Rfl:  .  VITAMIN D PO, Take 1,000 mg by mouth daily., Disp: , Rfl:    Allergies  Allergen Reactions  . Other Other (See Comments)    ANTI-DEPRESSANTS. ANTI-DEPRESSANTS Antidepressants...has a variety of reactions to different antidepressants  . Prozac [Fluoxetine Hcl] Other (See Comments)    nightmares  . Amoxicillin   . Oxycodone Other (See Comments)    unknown   Review of Systems   Pertinent items are noted in the HPI. Otherwise, a complete ROS is negative.  Vitals:   Vitals:   05/26/18 1311  Pulse: 62  Temp: (!) 97.3 F (36.3 C)  TempSrc: Oral  SpO2: 97%  Weight: 148 lb 9.6 oz (67.4 kg)  Height: 5\' 5"  (1.651 m)     Body mass index is 24.73 kg/m.  Physical Exam:   Physical Exam Vitals signs and nursing note reviewed.  Constitutional:      General: He is not in acute distress.    Appearance: Normal appearance. He is well-developed.  HENT:     Head: Normocephalic and atraumatic.     Right Ear: External ear normal.     Left Ear: External ear normal.     Nose: Congestion and rhinorrhea present.     Right Sinus: Maxillary sinus tenderness present.     Left Sinus: Maxillary sinus tenderness present.  Eyes:     Conjunctiva/sclera: Conjunctivae normal.     Pupils: Pupils are equal, round, and reactive to light.  Neck:     Musculoskeletal: Neck supple.  Cardiovascular:     Rate and Rhythm: Normal rate and regular rhythm.  Pulmonary:     Effort: Pulmonary effort is normal.  Abdominal:     General: Bowel sounds are normal.     Palpations: Abdomen is soft.  Musculoskeletal: Normal range of motion.  Skin:     General: Skin is warm.  Neurological:     General: No focal deficit present.     Mental Status: He is alert.  Psychiatric:        Mood and Affect: Mood normal.        Behavior: Behavior normal.    Assessment and Plan:   Alain was seen today for follow-up.  Diagnoses and all orders for this visit:  Essential hypertension, on Losartan, Coreg, ASA -     CBC with Differential/Platelet -     Comprehensive metabolic panel -     Magnesium -     CK  Caregiver burden, of wife  History of cutaneous  T-cell lymphoma  Moderate episode of recurrent major depressive disorder (HCC)  Chronic right shoulder pain  Acute recurrent maxillary sinusitis -     azithromycin (ZITHROMAX) 250 MG tablet; 2 tab on day one and after until done -     predniSONE (DELTASONE) 5 MG tablet; 6-5-4-3-2-1    . Orders and follow up as documented in [place], reviewed diet, exercise and weight control, cardiovascular risk and specific lipid/LDL goals reviewed, reviewed medications and side effects in detail.  . Reviewed expectations re: course of current medical issues. . Outlined signs and symptoms indicating need for more acute intervention. . Patient verbalized understanding and all questions were answered. . Patient received an After Visit Summary.  CMA served as Education administrator during this visit. History, Physical, and Plan performed by medical provider. The above documentation has been reviewed and is accurate and complete. [name], D.O.  Johnathan Deutscher, DO [place], [place] [facility] 05/27/2018

## 2018-05-26 ENCOUNTER — Ambulatory Visit (INDEPENDENT_AMBULATORY_CARE_PROVIDER_SITE_OTHER): Payer: Medicare Other | Admitting: Family Medicine

## 2018-05-26 ENCOUNTER — Encounter: Payer: Self-pay | Admitting: Family Medicine

## 2018-05-26 VITALS — HR 62 | Temp 97.3°F | Ht 65.0 in | Wt 148.6 lb

## 2018-05-26 DIAGNOSIS — Z8572 Personal history of non-Hodgkin lymphomas: Secondary | ICD-10-CM

## 2018-05-26 DIAGNOSIS — Z636 Dependent relative needing care at home: Secondary | ICD-10-CM

## 2018-05-26 DIAGNOSIS — G8929 Other chronic pain: Secondary | ICD-10-CM

## 2018-05-26 DIAGNOSIS — F331 Major depressive disorder, recurrent, moderate: Secondary | ICD-10-CM

## 2018-05-26 DIAGNOSIS — I10 Essential (primary) hypertension: Secondary | ICD-10-CM

## 2018-05-26 DIAGNOSIS — M25511 Pain in right shoulder: Secondary | ICD-10-CM | POA: Diagnosis not present

## 2018-05-26 DIAGNOSIS — J0101 Acute recurrent maxillary sinusitis: Secondary | ICD-10-CM | POA: Diagnosis not present

## 2018-05-26 LAB — CBC WITH DIFFERENTIAL/PLATELET
Basophils Absolute: 0 10*3/uL (ref 0.0–0.1)
Basophils Relative: 0.4 % (ref 0.0–3.0)
Eosinophils Absolute: 0.1 10*3/uL (ref 0.0–0.7)
Eosinophils Relative: 1.4 % (ref 0.0–5.0)
HCT: 46.3 % (ref 39.0–52.0)
Hemoglobin: 15.6 g/dL (ref 13.0–17.0)
Lymphocytes Relative: 22.7 % (ref 12.0–46.0)
Lymphs Abs: 1 10*3/uL (ref 0.7–4.0)
MCHC: 33.8 g/dL (ref 30.0–36.0)
MCV: 100.1 fl — ABNORMAL HIGH (ref 78.0–100.0)
Monocytes Absolute: 0.5 10*3/uL (ref 0.1–1.0)
Monocytes Relative: 10.3 % (ref 3.0–12.0)
Neutro Abs: 2.9 10*3/uL (ref 1.4–7.7)
Neutrophils Relative %: 65.2 % (ref 43.0–77.0)
Platelets: 105 10*3/uL — ABNORMAL LOW (ref 150.0–400.0)
RBC: 4.62 Mil/uL (ref 4.22–5.81)
RDW: 13.2 % (ref 11.5–15.5)
WBC: 4.5 10*3/uL (ref 4.0–10.5)

## 2018-05-26 LAB — COMPREHENSIVE METABOLIC PANEL
ALT: 15 U/L (ref 0–53)
AST: 17 U/L (ref 0–37)
Albumin: 3.9 g/dL (ref 3.5–5.2)
Alkaline Phosphatase: 61 U/L (ref 39–117)
BUN: 16 mg/dL (ref 6–23)
CO2: 29 mEq/L (ref 19–32)
Calcium: 8.9 mg/dL (ref 8.4–10.5)
Chloride: 104 mEq/L (ref 96–112)
Creatinine, Ser: 0.83 mg/dL (ref 0.40–1.50)
GFR: 94.84 mL/min (ref >60.00)
Glucose, Bld: 95 mg/dL (ref 70–99)
Potassium: 4 mEq/L (ref 3.5–5.1)
Sodium: 140 mEq/L (ref 135–145)
Total Bilirubin: 0.9 mg/dL (ref 0.2–1.2)
Total Protein: 5.8 g/dL — ABNORMAL LOW (ref 6.0–8.3)

## 2018-05-26 LAB — CK: Total CK: 43 U/L (ref 7–232)

## 2018-05-26 LAB — MAGNESIUM: Magnesium: 2.1 mg/dL (ref 1.5–2.5)

## 2018-05-26 MED ORDER — PREDNISONE 5 MG PO TABS: ORAL_TABLET | ORAL | 0 refills | Status: DC

## 2018-05-26 MED ORDER — AZITHROMYCIN 250 MG PO TABS: ORAL_TABLET | ORAL | 0 refills | Status: DC

## 2018-05-26 NOTE — Patient Instructions (Signed)
Shoulder Exercises Ask your health care provider which exercises are safe for you. Do exercises exactly as told by your health care provider and adjust them as directed. It is normal to feel mild stretching, pulling, tightness, or discomfort as you do these exercises, but you should stop right away if you feel sudden pain or your pain gets worse.Do not begin these exercises until told by your health care provider. Range of Motion Exercises        These exercises warm up your muscles and joints and improve the movement and flexibility of your shoulder. These exercises also help to relieve pain, numbness, and tingling. These exercises involve stretching your injured shoulder directly. Exercise A: Pendulum 1. Stand near a wall or a surface that you can hold onto for balance. 2. Bend at the waist and let your left / right arm hang straight down. Use your other arm to support you. Keep your back straight and do not lock your knees. 3. Relax your left / right arm and shoulder muscles, and move your hips and your trunk so your left / right arm swings freely. Your arm should swing because of the motion of your body, not because you are using your arm or shoulder muscles. 4. Keep moving your body so your arm swings in the following directions, as told by your health care provider: ? Side to side. ? Forward and backward. ? In clockwise and counterclockwise circles. 5. Continue each motion for __________ seconds, or for as long as told by your health care provider. 6. Slowly return to the starting position. Repeat __________ times. Complete this exercise __________ times a day. Exercise B:Flexion, Standing 1. Stand and hold a broomstick, a cane, or a similar object. Place your hands a little more than shoulder-width apart on the object. Your left / right hand should be palm-up, and your other hand should be palm-down. 2. Keep your elbow straight and keep your shoulder muscles relaxed. Push the stick  down with your healthy arm to raise your left / right arm in front of your body, and then over your head until you feel a stretch in your shoulder. ? Avoid shrugging your shoulder while you raise your arm. Keep your shoulder blade tucked down toward the middle of your back. 3. Hold for __________ seconds. 4. Slowly return to the starting position. Repeat __________ times. Complete this exercise __________ times a day. Exercise C: Abduction, Standing 1. Stand and hold a broomstick, a cane, or a similar object. Place your hands a little more than shoulder-width apart on the object. Your left / right hand should be palm-up, and your other hand should be palm-down. 2. While keeping your elbow straight and your shoulder muscles relaxed, push the stick across your body toward your left / right side. Raise your left / right arm to the side of your body and then over your head until you feel a stretch in your shoulder. ? Do not raise your arm above shoulder height, unless your health care provider tells you to do that. ? Avoid shrugging your shoulder while you raise your arm. Keep your shoulder blade tucked down toward the middle of your back. 3. Hold for __________ seconds. 4. Slowly return to the starting position. Repeat __________ times. Complete this exercise __________ times a day. Exercise D:Internal Rotation 1. Place your left / right hand behind your back, palm-up. 2. Use your other hand to dangle an exercise band, a towel, or a similar object over your shoulder.   Grasp the band with your left / right hand so you are holding onto both ends. 3. Gently pull up on the band until you feel a stretch in the front of your left / right shoulder. ? Avoid shrugging your shoulder while you raise your arm. Keep your shoulder blade tucked down toward the middle of your back. 4. Hold for __________ seconds. 5. Release the stretch by letting go of the band and lowering your hands. Repeat __________ times.  Complete this exercise __________ times a day. Stretching Exercises  These exercises warm up your muscles and joints and improve the movement and flexibility of your shoulder. These exercises also help to relieve pain, numbness, and tingling. These exercises are done using your healthy shoulder to help stretch the muscles of your injured shoulder. Exercise E: Corner Stretch (External Rotation and Abduction) 1. Stand in a doorway with one of your feet slightly in front of the other. This is called a staggered stance. If you cannot reach your forearms to the door frame, stand facing a corner of a room. 2. Choose one of the following positions as told by your health care provider: ? Place your hands and forearms on the door frame above your head. ? Place your hands and forearms on the door frame at the height of your head. ? Place your hands on the door frame at the height of your elbows. 3. Slowly move your weight onto your front foot until you feel a stretch across your chest and in the front of your shoulders. Keep your head and chest upright and keep your abdominal muscles tight. 4. Hold for __________ seconds. 5. To release the stretch, shift your weight to your back foot. Repeat __________ times. Complete this stretch __________ times a day. Exercise F:Extension, Standing 1. Stand and hold a broomstick, a cane, or a similar object behind your back. ? Your hands should be a little wider than shoulder-width apart. ? Your palms should face away from your back. 2. Keeping your elbows straight and keeping your shoulder muscles relaxed, move the stick away from your body until you feel a stretch in your shoulder. ? Avoid shrugging your shoulders while you move the stick. Keep your shoulder blade tucked down toward the middle of your back. 3. Hold for __________ seconds. 4. Slowly return to the starting position. Repeat __________ times. Complete this exercise __________ times a  day. Strengthening Exercises           These exercises build strength and endurance in your shoulder. Endurance is the ability to use your muscles for a long time, even after they get tired. Exercise G:External Rotation 1. Sit in a stable chair without armrests. 2. Secure an exercise band at elbow height on your left / right side. 3. Place a soft object, such as a folded towel or a small pillow, between your left / right upper arm and your body to move your elbow a few inches away (about 10 cm) from your side. 4. Hold the end of the band so it is tight and there is no slack. 5. Keeping your elbow pressed against the soft object, move your left / right forearm out, away from your abdomen. Keep your body steady so only your forearm moves. 6. Hold for __________ seconds. 7. Slowly return to the starting position. Repeat __________ times. Complete this exercise __________ times a day. Exercise H:Shoulder Abduction 1. Sit in a stable chair without armrests, or stand. 2. Hold a __________ weight in your   left / right hand, or hold an exercise band with both hands. 3. Start with your arms straight down and your left / right palm facing in, toward your body. 4. Slowly lift your left / right hand out to your side. Do not lift your hand above shoulder height unless your health care provider tells you that this is safe. ? Keep your arms straight. ? Avoid shrugging your shoulder while you do this movement. Keep your shoulder blade tucked down toward the middle of your back. 5. Hold for __________ seconds. 6. Slowly lower your arm, and return to the starting position. Repeat __________ times. Complete this exercise __________ times a day. Exercise I:Shoulder Extension 1. Sit in a stable chair without armrests, or stand. 2. Secure an exercise band to a stable object in front of you where it is at shoulder height. 3. Hold one end of the exercise band in each hand. Your palms should face each  other. 4. Straighten your elbows and lift your hands up to shoulder height. 5. Step back, away from the secured end of the exercise band, until the band is tight and there is no slack. 6. Squeeze your shoulder blades together as you pull your hands down to the sides of your thighs. Stop when your hands are straight down by your sides. Do not let your hands go behind your body. 7. Hold for __________ seconds. 8. Slowly return to the starting position. Repeat __________ times. Complete this exercise __________ times a day. Exercise J:Standing Shoulder Row 1. Sit in a stable chair without armrests, or stand. 2. Secure an exercise band to a stable object in front of you so it is at waist height. 3. Hold one end of the exercise band in each hand. Your palms should be in a thumbs-up position. 4. Bend each of your elbows to an "L" shape (about 90 degrees) and keep your upper arms at your sides. 5. Step back until the band is tight and there is no slack. 6. Slowly pull your elbows back behind you. 7. Hold for __________ seconds. 8. Slowly return to the starting position. Repeat __________ times. Complete this exercise __________ times a day. Exercise K:Shoulder Press-Ups 1. Sit in a stable chair that has armrests. Sit upright, with your feet flat on the floor. 2. Put your hands on the armrests so your elbows are bent and your fingers are pointing forward. Your hands should be about even with the sides of your body. 3. Push down on the armrests and use your arms to lift yourself off of the chair. Straighten your elbows and lift yourself up as much as you comfortably can. ? Move your shoulder blades down, and avoid letting your shoulders move up toward your ears. ? Keep your feet on the ground. As you get stronger, your feet should support less of your body weight as you lift yourself up. 4. Hold for __________ seconds. 5. Slowly lower yourself back into the chair. Repeat __________ times. Complete  this exercise __________ times a day. Exercise L: Wall Push-Ups 1. Stand so you are facing a stable wall. Your feet should be about one arm-length away from the wall. 2. Lean forward and place your palms on the wall at shoulder height. 3. Keep your feet flat on the floor as you bend your elbows and lean forward toward the wall. 4. Hold for __________ seconds. 5. Straighten your elbows to push yourself back to the starting position. Repeat __________ times. Complete this exercise __________ times   a day. This information is not intended to replace advice given to you by your health care provider. Make sure you discuss any questions you have with your health care provider. Document Released: 03/14/2005 Document Revised: 09/03/2017 Document Reviewed: 01/09/2015 Elsevier Interactive Patient Education  2019 [employer] Inc.  

## 2018-05-27 ENCOUNTER — Encounter: Payer: Self-pay | Admitting: Family Medicine

## 2018-05-29 ENCOUNTER — Ambulatory Visit (INDEPENDENT_AMBULATORY_CARE_PROVIDER_SITE_OTHER): Payer: Medicare Other | Admitting: Physician Assistant

## 2018-05-29 ENCOUNTER — Encounter: Payer: Self-pay | Admitting: Physician Assistant

## 2018-05-29 ENCOUNTER — Ambulatory Visit: Payer: Medicare Other | Admitting: Family Medicine

## 2018-05-29 ENCOUNTER — Ambulatory Visit (INDEPENDENT_AMBULATORY_CARE_PROVIDER_SITE_OTHER): Payer: Medicare Other

## 2018-05-29 ENCOUNTER — Ambulatory Visit: Payer: Self-pay

## 2018-05-29 VITALS — BP 130/70 | HR 62 | Temp 97.6°F | Ht 65.0 in | Wt 150.0 lb

## 2018-05-29 DIAGNOSIS — J069 Acute upper respiratory infection, unspecified: Secondary | ICD-10-CM | POA: Diagnosis not present

## 2018-05-29 DIAGNOSIS — R6889 Other general symptoms and signs: Secondary | ICD-10-CM

## 2018-05-29 DIAGNOSIS — R05 Cough: Secondary | ICD-10-CM | POA: Diagnosis not present

## 2018-05-29 LAB — POC INFLUENZA A&B (BINAX/QUICKVUE)
Influenza A, POC: NEGATIVE
Influenza B, POC: NEGATIVE

## 2018-05-29 NOTE — Telephone Encounter (Signed)
Yes the patient is scheduled for 11:20 with Sam. Lea and I believe it was a typing error on the agents behalf.

## 2018-05-29 NOTE — Patient Instructions (Addendum)
It was great to see you!  We will be in touch with your xray read from the radiologist.  Flu test negative.  Finish the azithromycin antibiotic.  Start plain mucinex (guaifenesin) and dextromethrophan (delsym) to help loosen up congestion and get cough under control.  Push fluids and get plenty of rest. Please return if you are not improving as expected, or if you have high fevers (>101.5) or difficulty swallowing or worsening productive cough.  Call clinic with questions.  Please follow-up with me or Dr. [name] at beginning of next week if no improvement.  I hope you start feeling better soon!

## 2018-05-29 NOTE — Telephone Encounter (Signed)
See note

## 2018-05-29 NOTE — Telephone Encounter (Signed)
[name], pt is on my schedule for 11:20 is that not true?

## 2018-05-29 NOTE — Telephone Encounter (Signed)
Out going call to Patient with complaint  of cough.   Not improving after antibiotic.  Onset was December.  Denies fever, hemoptysis.  Denies home treatment.  Rather Dr. [name] treat him.   Hx of heart attack.  Denies lung hx.  Has not been out of country.  Patient scheduled for    05/29/18@ 3 :25pm, with Dr. Dimas Chyle.  Patient voices understanding.  Provided care advice voices understanding.                                                                                                                                                                                                                                                         Reason for Disposition . Cough has been present for > 3 weeks  Answer Assessment - Initial Assessment Questions 1. ONSET: "When did the cough begin?"      LATE dECEMBER  2. SEVERITY: "How bad is the cough today?"       sEVERE 3. RESPIRATORY DISTRESS: "Describe your breathing."      DENIES 4. FEVER: "Do you have a fever?" If so, ask: "What is your temperature, how was it measured, and when did it start?"     DENIES FEVER 5. HEMOPTYSIS: "Are you coughing up any blood?" If so ask: "How much?" (flecks, streaks, tablespoons, etc.)     DENIES 6. TREATMENT: "What have you done so far to treat the cough?" (e.g., meds, fluids, humidifier)     DENIES 7. CARDIAC HISTORY: "Do you have any history of heart disease?" (e.g., heart attack, congestive heart failure)      HEART ATTACK 8. LUNG HISTORY: "Do you have any history of lung disease?"  (e.g., pulmonary embolus, asthma, emphysema)      DENIES 9. PE RISK FACTORS: "Do you have a history of blood clots?" (or: recent major surgery, recent prolonged travel, bedridden)     DIES 10. OTHER SYMPTOMS: "Do you have any other symptoms? (e.g., runny nose, wheezing, chest pain)       NO 11. PREGNANCY: "Is there any chance you are pregnant?" "When was your last menstrual period?"       NA 12. TRAVEL: "Have you traveled out of  the country in the last month?" (e.g., travel history, exposures)       NA  Protocols used:  COUGH - ACUTE NON-PRODUCTIVE-A-AH

## 2018-05-29 NOTE — Progress Notes (Signed)
[name] is a 78 y.o. male here for a follow up of a pre-existing problem.  I acted as a Education administrator for [employer], PA-C Anselmo Pickler, LPN  History of Present Illness:   Chief Complaint  Patient presents with  . Cough    Cough  This is a recurrent problem. The cough is productive of sputum (Yellow sputum). Associated symptoms include chills and shortness of breath. Associated symptoms comments: Chest congestion, hoarse voice. The symptoms are aggravated by lying down. He has tried oral steroids (Started Z-Pak on 01/13 took two tablets that night and only tablet next day and then stopped.) for the symptoms. The treatment provided no relief. His past medical history is significant for bronchitis and pneumonia.   His wife lives in a nursing home and has recently been sick with URI symptoms and fever of 102.  Past Medical History:  Diagnosis Date  . Anxiety   . Arthritis of multiple joints   . Basal cell carcinoma of right scalp   . Benign prostatic hypertrophy   . CAD (coronary artery disease)   . Chronic anticoagulation 02/20/2016  . Chronic lower back pain, disk disease   . Depression   . Diverticulosis of colon   . Dysrhythmia   . Factor VII deficiency ([place])    10/28/15: No history of factor VII deficiency per Dr. Beryle Beams  . History of blood transfusion 1941  . History of colon polyps   . Hyperlipidemia   . ITP (idiopathic thrombocytopenic purpura)   . Kidney stones x 2, both passed   . Myocardial infarction ([place]) 2009  . Seasonal allergies   . Squamous carcinoma left scalp   . T-cell lymphoma ([place])    "I'm in stage I; original site was on my left chest; had a place around my waist treated last summer; got a place on back of my left leg" (10/03/2012)     Social History   Socioeconomic History  . Marital status: Married    Spouse name: Not on file  . Number of children: 2  . Years of education: Not on file  . Highest education level: Not on file  Occupational  History  . Occupation: Retired  Scientific laboratory technician  . Financial resource strain: Not on file  . Food insecurity:    Worry: Not on file    Inability: Not on file  . Transportation needs:    Medical: Not on file    Non-medical: Not on file  Tobacco Use  . Smoking status: Never Smoker  . Smokeless tobacco: Never Used  Substance and Sexual Activity  . Alcohol use: No    Alcohol/week: 0.0 standard drinks  . Drug use: No  . Sexual activity: Not Currently  Lifestyle  . Physical activity:    Days per week: Not on file    Minutes per session: Not on file  . Stress: Not on file  Relationships  . Social connections:    Talks on phone: Not on file    Gets together: Not on file    Attends religious service: Not on file    Active member of club or organization: Not on file    Attends meetings of clubs or organizations: Not on file    Relationship status: Not on file  . Intimate partner violence:    Fear of current or ex partner: Not on file    Emotionally abused: Not on file    Physically abused: Not on file    Forced sexual activity:  Not on file  Other Topics Concern  . Not on file  Social History Narrative  . Not on file    Past Surgical History:  Procedure Laterality Date  . CARDIAC CATHETERIZATION  10/03/2012  . CATARACT EXTRACTION W/ INTRAOCULAR LENS  IMPLANT, BILATERAL Bilateral 2000's  . COLONOSCOPY    . CORONARY ANGIOPLASTY WITH STENT PLACEMENT  05/29/2007   "1" (10/03/2012)  . CYST EXCISION  1980's?   "or fatty tumor; taken off my back" (10/03/2012)  . KNEE SURGERY     right  . LEFT HEART CATHETERIZATION WITH CORONARY ANGIOGRAM N/A 10/03/2012   Procedure: LEFT HEART CATHETERIZATION WITH CORONARY ANGIOGRAM;  Surgeon: Burnell Blanks, MD;  Location: [facility] CATH LAB;  Service: Cardiovascular;  Laterality: N/A;  . LUMBAR EPIDURAL INJECTION  ~ 2004  . MOHS SURGERY Bilateral 2011-2012   "top of my head" (10/03/2012)  . PARTIAL KNEE ARTHROPLASTY Right 11/08/2015   Procedure:  RIGHT UNICOMPARTMENTAL KNEE;  Surgeon: Renette Butters, MD;  Location: [facility];  Service: Orthopedics;  Laterality: Right;  . TONSILLECTOMY  ~ 1945  . VASECTOMY      Family History  Problem Relation Age of Onset  . Alzheimer's disease Mother   . Lymphoma Father   . Coronary artery disease Brother   . COPD Brother   . Diabetes Brother   . Colon cancer Paternal Uncle   . Diabetes Brother     Allergies  Allergen Reactions  . Other Other (See Comments)    ANTI-DEPRESSANTS. ANTI-DEPRESSANTS Antidepressants...has a variety of reactions to different antidepressants  . Prozac [Fluoxetine Hcl] Other (See Comments)    nightmares  . Amoxicillin   . Oxycodone Other (See Comments)    unknown    Current Medications:   Current Outpatient Medications:  .  acetaminophen (TYLENOL) 650 MG CR tablet, Take 650 mg by mouth 2 (two) times daily. , Disp: , Rfl:  .  ALPRAZolam (XANAX) 0.5 MG tablet, Take 1 tablet (0.5 mg total) by mouth 2 (two) times daily as needed for anxiety (for sleep)., Disp: 60 tablet, Rfl: 5 .  aspirin 81 MG tablet, Take 81 mg by mouth daily.  , Disp: , Rfl:  .  atorvastatin (LIPITOR) 20 MG tablet, Take 1 tablet (20 mg total) by mouth daily., Disp: 90 tablet, Rfl: 3 .  carvedilol (COREG) 3.125 MG tablet, Take 1 tablet (3.125 mg total) by mouth 2 (two) times daily with a meal., Disp: 180 tablet, Rfl: 3 .  fluticasone (FLONASE) 50 MCG/ACT nasal spray, PLACE 1 SPRAY INTO BOTH NOSTRILS 2 (TWO) TIMES DAILY., Disp: 16 g, Rfl: 2 .  losartan (COZAAR) 50 MG tablet, Take 1 tablet (50 mg total) by mouth daily., Disp: 90 tablet, Rfl: 3 .  Magnesium 250 MG TABS, Take 1 tablet by mouth daily., Disp: , Rfl:  .  nitroGLYCERIN (NITROSTAT) 0.4 MG SL tablet, Place 1 tablet (0.4 mg total) under the tongue every 5 (five) minutes as needed for chest pain (for chest pain)., Disp: 25 tablet, Rfl: 6 .  Omega-3 Fatty Acids (FISH OIL) 1000 MG CAPS, Take 1,000 mg by mouth 2 (two) times daily. , Disp: , Rfl:   .  predniSONE (DELTASONE) 5 MG tablet, 6-5-4-3-2-1, Disp: 21 tablet, Rfl: 0 .  Saw Palmetto 450 MG CAPS, Take 450 mg by mouth 2 (two) times daily. , Disp: , Rfl:  .  VITAMIN D PO, Take 1,000 mg by mouth daily., Disp: , Rfl:  .  azithromycin (ZITHROMAX) 250 MG tablet, 2 tab on  day one and after until done (Patient not taking: Reported on 05/29/2018), Disp: 6 tablet, Rfl: 0   Review of Systems:   Review of Systems  Constitutional: Positive for chills.  Respiratory: Positive for cough and shortness of breath.     Vitals:   Vitals:   05/29/18 1122  BP: 130/70  Pulse: 62  Temp: 97.6 F (36.4 C)  TempSrc: Oral  SpO2: 99%  Weight: 150 lb (68 kg)  Height: 5\' 5"  (1.651 m)     Body mass index is 24.96 kg/m.  Physical Exam:   Physical Exam Vitals signs and nursing note reviewed.  Constitutional:      General: He is not in acute distress.    Appearance: He is well-developed. He is not ill-appearing or toxic-appearing.  HENT:     Head: Normocephalic and atraumatic.     Right Ear: Tympanic membrane, ear canal and external ear normal. Tympanic membrane is not erythematous, retracted or bulging.     Left Ear: Tympanic membrane, ear canal and external ear normal. Tympanic membrane is not erythematous, retracted or bulging.     Nose: Nose normal.     Right Sinus: No maxillary sinus tenderness or frontal sinus tenderness.     Left Sinus: No maxillary sinus tenderness or frontal sinus tenderness.     Mouth/Throat:     Pharynx: Uvula midline. Posterior oropharyngeal erythema present.  Eyes:     General: Lids are normal.     Conjunctiva/sclera: Conjunctivae normal.  Neck:     Trachea: Trachea normal.  Cardiovascular:     Rate and Rhythm: Normal rate and regular rhythm.     Heart sounds: Normal heart sounds, S1 normal and S2 normal.  Pulmonary:     Effort: Pulmonary effort is normal.     Breath sounds: Examination of the right-lower field reveals decreased breath sounds. Examination  of the left-lower field reveals decreased breath sounds. Decreased breath sounds present. No wheezing, rhonchi or rales.  Lymphadenopathy:     Cervical: No cervical adenopathy.  Skin:    General: Skin is warm and dry.  Neurological:     Mental Status: He is alert.  Psychiatric:        Speech: Speech normal.        Behavior: Behavior normal. Behavior is cooperative.     Results for orders placed or performed in visit on 05/29/18  POC Influenza A&B(BINAX/QUICKVUE)  Result Value Ref Range   Influenza A, POC Negative Negative   Influenza B, POC Negative Negative   Chest xray: no evidence of PNA  Assessment and Plan:   Dionne was seen today for cough.  Diagnoses and all orders for this visit:  Upper respiratory tract infection, unspecified type No red flags on exam.  Chest xray without evidence of PNA. Recommended that the patient continue azithromycin and steroid as prescribed per PCP, follow-up next week if patient still has concerns. Discussed taking medications as prescribed. Reviewed return precautions including worsening fever, SOB, worsening cough or other concerns. Push fluids and rest. I recommend that patient follow-up if symptoms worsen or persist despite treatment x 7-10 days, sooner if needed. -     DG Chest 2 View; Future  Flu-like symptoms Flu test negative. -     POC Influenza A&B(BINAX/QUICKVUE)  . Reviewed expectations re: course of current medical issues. . Discussed self-management of symptoms. . Outlined signs and symptoms indicating need for more acute intervention. . Patient verbalized understanding and all questions were answered. . See orders for  this visit as documented in the electronic medical record. . Patient received an After-Visit Summary.  CMA or LPN served as scribe during this visit. History, Physical, and Plan performed by medical provider. The above documentation has been reviewed and is accurate and complete.  Inda Coke, PA-C

## 2018-05-30 ENCOUNTER — Encounter: Payer: Self-pay | Admitting: Physician Assistant

## 2018-06-04 ENCOUNTER — Telehealth: Payer: Self-pay | Admitting: Family Medicine

## 2018-06-04 NOTE — Telephone Encounter (Signed)
Copied from [place] [id number]. Topic: General - Other >> Jun 04, 2018 10:19 AM Carolyn Stare wrote:  Pt said he has been in twice and still having cough with phelm not coming up and has been taking mucinex and has been since last Thursday. He did set up an appt for 06/06/2018 but prefer not to come in if Dr [name] wants to call him in something he would be ok with that    [place]

## 2018-06-04 NOTE — Telephone Encounter (Signed)
FYI

## 2018-06-05 NOTE — Telephone Encounter (Signed)
Per patient he has not resting. He feels like he is getting a little better. He is still having "hard non productive cough, no fever, some wheezing when he lays down at night. Cough is not bad at night.

## 2018-06-05 NOTE — Telephone Encounter (Signed)
Call him to see if he has been resting. Finished with antibiotics? Cough productive or dry? Fever? Keeping him awake? Wheeze or SOB or CP?

## 2018-06-06 ENCOUNTER — Ambulatory Visit: Payer: Medicare Other | Admitting: Family Medicine

## 2018-06-06 ENCOUNTER — Other Ambulatory Visit: Payer: Self-pay

## 2018-06-06 MED ORDER — FLUTICASONE PROPIONATE HFA 110 MCG/ACT IN AERO: 2.0000 | INHALATION_SPRAY | Freq: Two times a day (BID) | RESPIRATORY_TRACT | 0 refills | Status: DC | PRN

## 2018-06-06 NOTE — Telephone Encounter (Signed)
Please send in an inhaled corticosteroid. Qvar or Flovent or Pulmicort - whatever is cheapest for patient. Use BID x 1 month. Wash mouth after to prevent thrush.

## 2018-06-06 NOTE — Telephone Encounter (Signed)
Pt called back would like to have script called in. Is feeling much better but may get depending on cost.

## 2018-06-06 NOTE — Telephone Encounter (Signed)
Left message to return call to our office.  

## 2018-06-28 ENCOUNTER — Other Ambulatory Visit: Payer: Self-pay | Admitting: Family Medicine

## 2018-07-09 DIAGNOSIS — H35373 Puckering of macula, bilateral: Secondary | ICD-10-CM | POA: Diagnosis not present

## 2018-07-09 DIAGNOSIS — H52202 Unspecified astigmatism, left eye: Secondary | ICD-10-CM | POA: Diagnosis not present

## 2018-07-09 DIAGNOSIS — H43813 Vitreous degeneration, bilateral: Secondary | ICD-10-CM | POA: Diagnosis not present

## 2018-07-09 DIAGNOSIS — H40013 Open angle with borderline findings, low risk, bilateral: Secondary | ICD-10-CM | POA: Diagnosis not present

## 2018-07-13 ENCOUNTER — Other Ambulatory Visit: Payer: Self-pay | Admitting: Family Medicine

## 2018-07-14 MED ORDER — FLUTICASONE PROPIONATE HFA 110 MCG/ACT IN AERO: INHALATION_SPRAY | RESPIRATORY_TRACT | 1 refills | Status: DC

## 2018-07-14 NOTE — Addendum Note (Signed)
Addended by: Briscoe Deutscher R on: 07/14/2018 07:10 AM   Modules accepted: Orders

## 2018-07-17 DIAGNOSIS — F4323 Adjustment disorder with mixed anxiety and depressed mood: Secondary | ICD-10-CM | POA: Diagnosis not present

## 2018-07-30 ENCOUNTER — Encounter: Payer: Self-pay | Admitting: *Deleted

## 2018-08-21 ENCOUNTER — Telehealth: Payer: Self-pay | Admitting: Oncology

## 2018-08-21 NOTE — Telephone Encounter (Signed)
Changed 4/21 appt to telephone visit. Called and spoke with patient. Confirmed phone call was ok.

## 2018-09-02 ENCOUNTER — Inpatient Hospital Stay: Payer: Medicare Other | Attending: Oncology | Admitting: Oncology

## 2018-09-02 DIAGNOSIS — C8400 Mycosis fungoides, unspecified site: Secondary | ICD-10-CM | POA: Diagnosis not present

## 2018-09-02 DIAGNOSIS — D693 Immune thrombocytopenic purpura: Secondary | ICD-10-CM | POA: Diagnosis not present

## 2018-09-02 NOTE — Progress Notes (Signed)
Hematology and Oncology Follow Up for Telemedicine Visits  [name] [id number] 1940/08/05 78 y.o. 09/02/2018 1:38 PM Briscoe Deutscher, DOWallace, Danae Chen, DO   I connected with [name] on 09/02/18 at  2:15 PM EDT by telephone visit and verified that I am speaking with the correct person using two identifiers.   I discussed the limitations, risks, security and privacy concerns of performing an evaluation and management service by telemedicine and the availability of in-person appointments. I also discussed with the patient that there may be a patient responsible charge related to this service. The patient expressed understanding and agreed to proceed.  Other persons participating in the visit and their role in the encounter: None  Patient's location: Home Provider's location: Office    Principle Diagnosis:   78 year old gentleman with ITP diagnosed and 2009.  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   Prior Therapy:  He has been on active surveillance for his ITP and has not required any treatment.  Cutaneous treatment for his T-cell lymphoma.   Current therapy: Active surveillance.  Interim History: [name] reports no recent issues or complications.  He has been now following with Dr. Beryle Beams regarding these issues since at least 2009.  Platelet count has fluctuated between 100,000 and as low as 70,000 without any need for any intervention.  He denies any recent bruises or bleeding.  He denies any epistaxis or hematochezia.  He denies any other bleeding complications.  He continues to follow with dermatology regarding his T-cell lymphoma and squamous cell carcinoma in the past.  No new skin lesions.     Medications: I have reviewed the patient's current medications.  Current Outpatient Medications  Medication Sig Dispense Refill  . acetaminophen (TYLENOL) 650 MG CR tablet Take 650 mg by mouth 2 (two) times daily.     Marland Kitchen  ALPRAZolam (XANAX) 0.5 MG tablet Take 1 tablet (0.5 mg total) by mouth 2 (two) times daily as needed for anxiety (for sleep). 60 tablet 5  . aspirin 81 MG tablet Take 81 mg by mouth daily.      Marland Kitchen atorvastatin (LIPITOR) 20 MG tablet Take 1 tablet (20 mg total) by mouth daily. 90 tablet 3  . carvedilol (COREG) 3.125 MG tablet Take 1 tablet (3.125 mg total) by mouth 2 (two) times daily with a meal. 180 tablet 3  . fluticasone (FLONASE) 50 MCG/ACT nasal spray PLACE 1 SPRAY INTO BOTH NOSTRILS 2 (TWO) TIMES DAILY. 16 g 2  . fluticasone (FLOVENT HFA) 110 MCG/ACT inhaler INHALE 2 PUFFS BY MOUTH 2 TIMES DAILY AS NEED 36 Inhaler 1  . losartan (COZAAR) 50 MG tablet Take 1 tablet (50 mg total) by mouth daily. 90 tablet 3  . Magnesium 250 MG TABS Take 1 tablet by mouth daily.    . nitroGLYCERIN (NITROSTAT) 0.4 MG SL tablet Place 1 tablet (0.4 mg total) under the tongue every 5 (five) minutes as needed for chest pain (for chest pain). 25 tablet 6  . Omega-3 Fatty Acids (FISH OIL) 1000 MG CAPS Take 1,000 mg by mouth 2 (two) times daily.     . predniSONE (DELTASONE) 5 MG tablet 6-5-4-3-2-1 21 tablet 0  . Saw Palmetto 450 MG CAPS Take 450 mg by mouth 2 (two) times daily.     Marland Kitchen VITAMIN D PO Take 1,000 mg by mouth daily.     No current facility-administered medications for this visit.      Allergies:  Allergies  Allergen  Reactions  . Other Other (See Comments)    ANTI-DEPRESSANTS. ANTI-DEPRESSANTS Antidepressants...has a variety of reactions to different antidepressants  . Prozac [Fluoxetine Hcl] Other (See Comments)    nightmares  . Amoxicillin   . Oxycodone Other (See Comments)    unknown    Past Medical History, Surgical history, Social history, and Family History were reviewed and updated.  Review of Systems:  Remaining ROS negative.  Physical Exam: There were no vitals taken for this visit. ECOG:  General appearance: alert and cooperative appeared without distress. Head: Normocephalic,  without obvious abnormality Oropharynx: No oral thrush or ulcers. Eyes: No scleral icterus.  Pupils are equal and round reactive to light. Lymph nodes: Cervical, supraclavicular, and axillary nodes normal. Heart:regular rate and rhythm, S1, S2 normal, no murmur, click, rub or gallop Lung:chest clear, no wheezing, rales, normal symmetric air entry Abdomin: soft, non-tender, without masses or organomegaly. Neurological: No motor, sensory deficits.  Intact deep tendon reflexes. Skin: No rashes or lesions.  No ecchymosis or petechiae. Musculoskeletal: No joint deformity or effusion. Psychiatric: Mood and affect are appropriate.    Lab Results: Lab Results  Component Value Date   WBC 4.5 05/26/2018   HGB 15.6 05/26/2018   HCT 46.3 05/26/2018   MCV 100.1 (H) 05/26/2018   PLT 105.0 (L) 05/26/2018     Chemistry      Component Value Date/Time   NA 140 05/26/2018 1423   NA 140 03/18/2018 1126   NA 142 03/23/2013 1424   K 4.0 05/26/2018 1423   K 4.7 03/23/2013 1424   CL 104 05/26/2018 1423   CL 106 09/22/2012 1428   CO2 29 05/26/2018 1423   CO2 28 03/23/2013 1424   BUN 16 05/26/2018 1423   BUN 15 03/18/2018 1126   BUN 18.1 03/23/2013 1424   CREATININE 0.83 05/26/2018 1423   CREATININE 0.85 10/25/2014 1040   CREATININE 0.8 03/23/2013 1424      Component Value Date/Time   CALCIUM 8.9 05/26/2018 1423   CALCIUM 9.4 03/23/2013 1424   ALKPHOS 61 05/26/2018 1423   ALKPHOS 62 03/23/2013 1424   AST 17 05/26/2018 1423   AST 25 03/23/2013 1424   ALT 15 05/26/2018 1423   ALT 27 03/23/2013 1424   BILITOT 0.9 05/26/2018 1423   BILITOT 1.2 03/18/2018 1126   BILITOT 0.86 03/23/2013 1424      Impression and Plan:   78 year old man with the following:  1.  Thrombocytopenia dating back to 2009.  His platelet count has fluctuated as low as 70,000 but has been consistent around 100,000.  His CBC in January 2020 showed a 105 which is consistent with his baseline.  The natural course of  this disease was discussed today and these findings likely related to chronic ITP that does not require any treatment.  At the time being of elected to continue with active surveillance and repeat laboratory testing in July 2020.  2.  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  Continues to follow with dermatology with topical treatment.  3.  Follow-up: Will be in July 2020.  I discussed the assessment and treatment plan with the patient. The patient was provided an opportunity to ask questions and all were answered. The patient agreed with the plan and demonstrated an understanding of the instructions.   The patient was advised to call back or seek an in-person evaluation if the symptoms worsen or if the condition fails to improve as anticipated.  I provided 20 minutes of  non face-to-face telephone visit time during this encounter, and > 50% was dedicated to reviewing his laboratory data, previous treatment history, discussing differential diagnosis and answering questions regarding future plan of care.  Zola Button, MD 09/02/2018 1:38 PM

## 2018-09-03 ENCOUNTER — Telehealth: Payer: Self-pay | Admitting: Oncology

## 2018-09-03 NOTE — Telephone Encounter (Signed)
No los/no sch message per 4/21.

## 2018-09-05 ENCOUNTER — Telehealth: Payer: Self-pay | Admitting: *Deleted

## 2018-09-05 NOTE — Telephone Encounter (Signed)
Spoke to pt told him calling to schedule follow up with Dr. [name] about your blood pressure and to touch base with you. Pt said he would like to hold off on visit, he said knows Dr. [name] was worried about him due to wife in nursing home and not sleeping and under a lot of stress. Pt said he is doing well has not seen his wife in 6 weeks due to virus and has caught up on his sleep and is feeling okay. Told pt okay but if you need [place] we are here doing video visits so we can keep in touch. Please schedule an appt sometime in July for follow up then. Pt verbalized understanding.

## 2018-10-01 ENCOUNTER — Telehealth: Payer: Self-pay | Admitting: Oncology

## 2018-10-01 NOTE — Telephone Encounter (Signed)
Spoke with patient re July appointments.

## 2018-10-30 ENCOUNTER — Encounter: Payer: Self-pay | Admitting: Family Medicine

## 2018-10-31 ENCOUNTER — Other Ambulatory Visit: Payer: Self-pay

## 2018-10-31 MED ORDER — FLOVENT HFA 110 MCG/ACT IN AERO: INHALATION_SPRAY | RESPIRATORY_TRACT | 1 refills | Status: DC

## 2018-10-31 NOTE — Telephone Encounter (Signed)
Rx request Last Fill 07/14/18  #36/1 Last Ov 05/26/18

## 2018-12-03 ENCOUNTER — Inpatient Hospital Stay: Payer: Medicare Other

## 2018-12-03 ENCOUNTER — Inpatient Hospital Stay: Payer: Medicare Other | Attending: Oncology | Admitting: Oncology

## 2018-12-03 ENCOUNTER — Other Ambulatory Visit: Payer: Self-pay

## 2018-12-03 VITALS — BP 140/85 | HR 60 | Temp 99.0°F | Resp 17 | Ht 65.0 in | Wt 150.3 lb

## 2018-12-03 DIAGNOSIS — Z7982 Long term (current) use of aspirin: Secondary | ICD-10-CM

## 2018-12-03 DIAGNOSIS — Z8572 Personal history of non-Hodgkin lymphomas: Secondary | ICD-10-CM

## 2018-12-03 DIAGNOSIS — Z79899 Other long term (current) drug therapy: Secondary | ICD-10-CM

## 2018-12-03 DIAGNOSIS — D693 Immune thrombocytopenic purpura: Secondary | ICD-10-CM

## 2018-12-03 DIAGNOSIS — C8400 Mycosis fungoides, unspecified site: Secondary | ICD-10-CM

## 2018-12-03 LAB — CBC WITH DIFFERENTIAL (CANCER CENTER ONLY)
Abs Immature Granulocytes: 0.01 10*3/uL (ref 0.00–0.07)
Basophils Absolute: 0 10*3/uL (ref 0.0–0.1)
Basophils Relative: 1 %
Eosinophils Absolute: 0.1 10*3/uL (ref 0.0–0.5)
Eosinophils Relative: 3 %
HCT: 48.7 % (ref 39.0–52.0)
Hemoglobin: 15.6 g/dL (ref 13.0–17.0)
Immature Granulocytes: 0 %
Lymphocytes Relative: 28 %
Lymphs Abs: 1.5 10*3/uL (ref 0.7–4.0)
MCH: 32.2 pg (ref 26.0–34.0)
MCHC: 32 g/dL (ref 30.0–36.0)
MCV: 100.4 fL — ABNORMAL HIGH (ref 80.0–100.0)
Monocytes Absolute: 0.4 10*3/uL (ref 0.1–1.0)
Monocytes Relative: 8 %
Neutro Abs: 3.2 10*3/uL (ref 1.7–7.7)
Neutrophils Relative %: 60 %
Platelet Count: 112 10*3/uL — ABNORMAL LOW (ref 150–400)
RBC: 4.85 MIL/uL (ref 4.22–5.81)
RDW: 12.7 % (ref 11.5–15.5)
WBC Count: 5.3 10*3/uL (ref 4.0–10.5)
nRBC: 0 % (ref 0.0–0.2)

## 2018-12-03 LAB — CMP (CANCER CENTER ONLY)
ALT: 20 U/L (ref 0–44)
AST: 23 U/L (ref 15–41)
Albumin: 3.8 g/dL (ref 3.5–5.0)
Alkaline Phosphatase: 70 U/L (ref 38–126)
Anion gap: 7 (ref 5–15)
BUN: 14 mg/dL (ref 8–23)
CO2: 27 mmol/L (ref 22–32)
Calcium: 9.2 mg/dL (ref 8.9–10.3)
Chloride: 107 mmol/L (ref 98–111)
Creatinine: 0.82 mg/dL (ref 0.61–1.24)
GFR, Est AFR Am: >60 mL/min (ref >60)
GFR, Estimated: >60 mL/min (ref >60)
Glucose, Bld: 98 mg/dL (ref 70–99)
Potassium: 4 mmol/L (ref 3.5–5.1)
Sodium: 141 mmol/L (ref 135–145)
Total Bilirubin: 1.3 mg/dL — ABNORMAL HIGH (ref 0.3–1.2)
Total Protein: 6.3 g/dL — ABNORMAL LOW (ref 6.5–8.1)

## 2018-12-03 LAB — LACTATE DEHYDROGENASE: LDH: 184 U/L (ref 98–192)

## 2018-12-03 NOTE — Progress Notes (Signed)
Hematology and Oncology Follow Up Visit  DRAYTON TIEU [id number] 11-24-1940 78 y.o. 12/03/2018 12:14 PM Briscoe Deutscher, DOWallace, Danae Chen, DO   Principle Diagnosis: 78 year old man with:  1.  ITP diagnosed in 2009 and has been in remission since that time.  2. Mycosis fungoides diagnosed in April 2012.  Treated with topical cream without any systemic involvement.   Prior Therapy:  Topical therapy for his T-cell cutaneous lymphoma.  He did not require any treatment for his thrombocytopenia.    Current therapy: Active surveillance.  Interim History: [name] returns today for a follow-up.  He is a pleasant 78 year old gentleman has been followed with Dr. Beryle Beams for thrombocytopenia.  Since his last visit, he reports no major complications or issues.  He denies any excessive bleeding or any hospitalizations.  His platelet count has fluctuated close to 100,000 for over 10 years.  He denied any recent complaints as of late.  He does not report any headaches, blurry vision, syncope or seizures. Does not report any fevers, chills or sweats.  Does not report any cough, wheezing or hemoptysis.  Does not report any chest pain, palpitation, orthopnea or leg edema.  Does not report any nausea, vomiting or abdominal pain.  Does not report any constipation or diarrhea.  Does not report any skeletal complaints.    Does not report frequency, urgency or hematuria.  Does not report any skin rashes or lesions. Does not report any heat or cold intolerance.  Does not report any lymphadenopathy or petechiae.  Does not report any anxiety or depression.  Remaining review of systems is negative.    Medications: I have reviewed the patient's current medications.  Current Outpatient Medications  Medication Sig Dispense Refill  . acetaminophen (TYLENOL) 650 MG CR tablet Take 650 mg by mouth 2 (two) times daily.     Marland Kitchen ALPRAZolam (XANAX) 0.5 MG tablet Take 1 tablet (0.5 mg total) by mouth 2 (two) times daily as  needed for anxiety (for sleep). 60 tablet 5  . aspirin 81 MG tablet Take 81 mg by mouth daily.      Marland Kitchen atorvastatin (LIPITOR) 20 MG tablet Take 1 tablet (20 mg total) by mouth daily. 90 tablet 3  . carvedilol (COREG) 3.125 MG tablet Take 1 tablet (3.125 mg total) by mouth 2 (two) times daily with a meal. 180 tablet 3  . fluticasone (FLONASE) 50 MCG/ACT nasal spray PLACE 1 SPRAY INTO BOTH NOSTRILS 2 (TWO) TIMES DAILY. 16 g 2  . fluticasone (FLOVENT HFA) 110 MCG/ACT inhaler INHALE 2 PUFFS BY MOUTH 2 TIMES DAILY AS NEED 36 Inhaler 1  . losartan (COZAAR) 50 MG tablet Take 1 tablet (50 mg total) by mouth daily. 90 tablet 3  . Magnesium 250 MG TABS Take 1 tablet by mouth daily.    . nitroGLYCERIN (NITROSTAT) 0.4 MG SL tablet Place 1 tablet (0.4 mg total) under the tongue every 5 (five) minutes as needed for chest pain (for chest pain). 25 tablet 6  . Omega-3 Fatty Acids (FISH OIL) 1000 MG CAPS Take 1,000 mg by mouth 2 (two) times daily.     . predniSONE (DELTASONE) 5 MG tablet 6-5-4-3-2-1 21 tablet 0  . Saw Palmetto 450 MG CAPS Take 450 mg by mouth 2 (two) times daily.     Marland Kitchen VITAMIN D PO Take 1,000 mg by mouth daily.     No current facility-administered medications for this visit.      Allergies:  Allergies  Allergen Reactions  . Other Other (See  Comments)    ANTI-DEPRESSANTS. ANTI-DEPRESSANTS Antidepressants...has a variety of reactions to different antidepressants  . Prozac [Fluoxetine Hcl] Other (See Comments)    nightmares  . Amoxicillin   . Oxycodone Other (See Comments)    unknown    Past Medical History, Surgical history, Social history, and Family History were reviewed and updated.    Physical Exam: Blood pressure 140/85, pulse 60, temperature 99 F (37.2 C), temperature source Oral, resp. rate 17, height 5\' 5"  (1.651 m), weight 150 lb 4.8 oz (68.2 kg). ECOG: 1 General appearance: alert and cooperative appeared without distress. Head: Normocephalic, without obvious  abnormality Oropharynx: No oral thrush or ulcers. Eyes: No scleral icterus.  Pupils are equal and round reactive to light. Lymph nodes: Cervical, supraclavicular, and axillary nodes normal. Heart:regular rate and rhythm, S1, S2 normal, no murmur, click, rub or gallop Lung:chest clear, no wheezing, rales, normal symmetric air entry Abdomin: soft, non-tender, without masses or organomegaly. Neurological: No motor, sensory deficits.  Intact deep tendon reflexes. Skin: No rashes or lesions.  No ecchymosis or petechiae. Musculoskeletal: No joint deformity or effusion. Psychiatric: Mood and affect are appropriate.    Lab Results: Lab Results  Component Value Date   WBC 5.3 12/03/2018   HGB 15.6 12/03/2018   HCT 48.7 12/03/2018   MCV 100.4 (H) 12/03/2018   PLT 112 (L) 12/03/2018     Chemistry      Component Value Date/Time   NA 141 12/03/2018 1108   NA 140 03/18/2018 1126   NA 142 03/23/2013 1424   K 4.0 12/03/2018 1108   K 4.7 03/23/2013 1424   CL 107 12/03/2018 1108   CL 106 09/22/2012 1428   CO2 27 12/03/2018 1108   CO2 28 03/23/2013 1424   BUN 14 12/03/2018 1108   BUN 15 03/18/2018 1126   BUN 18.1 03/23/2013 1424   CREATININE 0.82 12/03/2018 1108   CREATININE 0.85 10/25/2014 1040   CREATININE 0.8 03/23/2013 1424      Component Value Date/Time   CALCIUM 9.2 12/03/2018 1108   CALCIUM 9.4 03/23/2013 1424   ALKPHOS 70 12/03/2018 1108   ALKPHOS 62 03/23/2013 1424   AST 23 12/03/2018 1108   AST 25 03/23/2013 1424   ALT 20 12/03/2018 1108   ALT 27 03/23/2013 1424   BILITOT 1.3 (H) 12/03/2018 1108   BILITOT 0.86 03/23/2013 1424        Impression and Plan:  78 year old man with:  1.    ITP diagnosed in 2009.  He presented with thrombocytopenia that has fluctuated most of 100,000 without any need for treatment.  Laboratory data obtained today and were personally reviewed and continues to show stable platelet count currently at 112.  His disease status was updated  and risk of relapse and treatment options for ITP was reviewed.  At this time he does not require any treatment and I recommended continued active surveillance.  Treatment with steroids, IVIG and rituximab were also reiterated.  2.  Cutaneous T-cell lymphoma: No recent relapse noted at this time.  I recommended continue following with dermatology.  3.  Follow-up: In 6 months for repeat evaluation.  15  minutes was spent with the patient face-to-face today.  More than 50% of time was spent on reviewing his disease status, reviewing laboratory data and potential treatment options of the future.     Zola Button, MD 7/22/202012:14 PM

## 2018-12-04 ENCOUNTER — Telehealth: Payer: Self-pay | Admitting: Oncology

## 2018-12-04 NOTE — Telephone Encounter (Signed)
Scheduled per los. Mailed printout  °

## 2018-12-17 ENCOUNTER — Other Ambulatory Visit: Payer: Self-pay | Admitting: Family Medicine

## 2019-01-13 ENCOUNTER — Other Ambulatory Visit: Payer: Self-pay

## 2019-01-13 DIAGNOSIS — Z20822 Contact with and (suspected) exposure to covid-19: Secondary | ICD-10-CM

## 2019-01-13 DIAGNOSIS — R6889 Other general symptoms and signs: Secondary | ICD-10-CM | POA: Diagnosis not present

## 2019-01-14 ENCOUNTER — Telehealth: Payer: Self-pay | Admitting: Family Medicine

## 2019-01-14 LAB — NOVEL CORONAVIRUS, NAA: SARS-CoV-2, NAA: NOT DETECTED

## 2019-01-14 NOTE — Telephone Encounter (Signed)
Pt returning call from the nurse about his covid results. Test was negative. Information given to pt

## 2019-02-09 ENCOUNTER — Telehealth: Payer: Self-pay | Admitting: Physical Therapy

## 2019-02-09 NOTE — Telephone Encounter (Signed)
Copied from [place] #[id number]. Topic: Appointment Scheduling - Scheduling Inquiry for Clinic >> Feb 09, 2019 10:33 AM Celene Kras A wrote: Reason for CRM: Pt called and is requesting to have his cholesterol and psa checked. Please advise.

## 2019-02-10 ENCOUNTER — Encounter: Payer: Self-pay | Admitting: Family Medicine

## 2019-02-10 ENCOUNTER — Other Ambulatory Visit: Payer: Self-pay

## 2019-02-10 ENCOUNTER — Ambulatory Visit (INDEPENDENT_AMBULATORY_CARE_PROVIDER_SITE_OTHER): Payer: Medicare Other | Admitting: Family Medicine

## 2019-02-10 VITALS — BP 110/70 | HR 58 | Temp 97.4°F | Ht 65.0 in | Wt 143.4 lb

## 2019-02-10 DIAGNOSIS — E559 Vitamin D deficiency, unspecified: Secondary | ICD-10-CM | POA: Diagnosis not present

## 2019-02-10 DIAGNOSIS — I10 Essential (primary) hypertension: Secondary | ICD-10-CM

## 2019-02-10 DIAGNOSIS — Z23 Encounter for immunization: Secondary | ICD-10-CM | POA: Diagnosis not present

## 2019-02-10 DIAGNOSIS — R5383 Other fatigue: Secondary | ICD-10-CM | POA: Diagnosis not present

## 2019-02-10 DIAGNOSIS — R5381 Other malaise: Secondary | ICD-10-CM | POA: Diagnosis not present

## 2019-02-10 DIAGNOSIS — E7800 Pure hypercholesterolemia, unspecified: Secondary | ICD-10-CM

## 2019-02-10 DIAGNOSIS — E538 Deficiency of other specified B group vitamins: Secondary | ICD-10-CM

## 2019-02-10 DIAGNOSIS — N400 Enlarged prostate without lower urinary tract symptoms: Secondary | ICD-10-CM | POA: Diagnosis not present

## 2019-02-10 LAB — VITAMIN B12: Vitamin B-12: 549 pg/mL (ref 211–911)

## 2019-02-10 LAB — CBC WITH DIFFERENTIAL/PLATELET
Basophils Absolute: 0 10*3/uL (ref 0.0–0.1)
Basophils Relative: 0.6 % (ref 0.0–3.0)
Eosinophils Absolute: 0.1 10*3/uL (ref 0.0–0.7)
Eosinophils Relative: 2.6 % (ref 0.0–5.0)
HCT: 47.5 % (ref 39.0–52.0)
Hemoglobin: 16 g/dL (ref 13.0–17.0)
Lymphocytes Relative: 28.9 % (ref 12.0–46.0)
Lymphs Abs: 1.3 10*3/uL (ref 0.7–4.0)
MCHC: 33.6 g/dL (ref 30.0–36.0)
MCV: 99.2 fl (ref 78.0–100.0)
Monocytes Absolute: 0.4 10*3/uL (ref 0.1–1.0)
Monocytes Relative: 8.5 % (ref 3.0–12.0)
Neutro Abs: 2.7 10*3/uL (ref 1.4–7.7)
Neutrophils Relative %: 59.4 % (ref 43.0–77.0)
Platelets: 121 10*3/uL — ABNORMAL LOW (ref 150.0–400.0)
RBC: 4.79 Mil/uL (ref 4.22–5.81)
RDW: 12.7 % (ref 11.5–15.5)
WBC: 4.6 10*3/uL (ref 4.0–10.5)

## 2019-02-10 LAB — MAGNESIUM: Magnesium: 2.3 mg/dL (ref 1.5–2.5)

## 2019-02-10 LAB — COMPREHENSIVE METABOLIC PANEL
ALT: 20 U/L (ref 0–53)
AST: 21 U/L (ref 0–37)
Albumin: 4.4 g/dL (ref 3.5–5.2)
Alkaline Phosphatase: 65 U/L (ref 39–117)
BUN: 14 mg/dL (ref 6–23)
CO2: 29 mEq/L (ref 19–32)
Calcium: 9.8 mg/dL (ref 8.4–10.5)
Chloride: 103 mEq/L (ref 96–112)
Creatinine, Ser: 0.89 mg/dL (ref 0.40–1.50)
GFR: 82.18 mL/min (ref >60.00)
Glucose, Bld: 102 mg/dL — ABNORMAL HIGH (ref 70–99)
Potassium: 4.9 mEq/L (ref 3.5–5.1)
Sodium: 139 mEq/L (ref 135–145)
Total Bilirubin: 1.3 mg/dL — ABNORMAL HIGH (ref 0.2–1.2)
Total Protein: 6.4 g/dL (ref 6.0–8.3)

## 2019-02-10 LAB — TSH: TSH: 1.91 u[IU]/mL (ref 0.35–4.50)

## 2019-02-10 LAB — LIPID PANEL
Cholesterol: 122 mg/dL (ref 0–200)
HDL: 62.2 mg/dL (ref >39.00)
LDL Cholesterol: 47 mg/dL (ref 0–99)
NonHDL: 59.4
Total CHOL/HDL Ratio: 2
Triglycerides: 64 mg/dL (ref 0.0–149.0)
VLDL: 12.8 mg/dL (ref 0.0–40.0)

## 2019-02-10 LAB — PSA: PSA: 1.67 ng/mL (ref 0.10–4.00)

## 2019-02-10 LAB — VITAMIN D 25 HYDROXY (VIT D DEFICIENCY, FRACTURES): VITD: 51.63 ng/mL (ref 30.00–100.00)

## 2019-02-10 NOTE — Progress Notes (Signed)
[name] is a 78 y.o. male is here for follow up.  History of Present Illness:   [name], acting as scribe for [name].   HPI: Patient here today for 6 month follow up visit.  Patient requesting a 90 day prescription for Alprazolam and the Fluticasone.  Patient received regular dose of the Influenza today.  He has been home on his own since March.  His wife is still and nursing facility.  He is visiting her virtually and talking her by phone daily.  He is also been test to help take care of his cousin, whom he foot and memory care very recently.  He has lost a few pounds.  States that it was worse that he is now gaining again.  Not getting out much but hopeful that this cooler weather will allow him to do that.  No new concerns.  Due for labs.  There are no preventive care reminders to display for this patient. Depression screen [facility] 2/9 02/10/2019 03/24/2018 03/26/2017  Decreased Interest 0 0 0  Down, Depressed, Hopeless 3 1 0  PHQ - 2 Score 3 1 0  Altered sleeping 2 - -  Tired, decreased energy 3 - -  Change in appetite 0 - -  Feeling bad or failure about yourself  0 - -  Trouble concentrating 0 - -  Moving slowly or fidgety/restless 0 - -  Suicidal thoughts 0 - -  PHQ-9 Score 8 - -  Difficult doing work/chores Not difficult at all - -   PMHx, SurgHx, SocialHx, FamHx, Medications, and Allergies were reviewed in the Visit Navigator and updated as appropriate.   Patient Active Problem List   Diagnosis Date Noted  . Malnutrition of mild degree ([place]) 03/21/2018  . Caregiver burden, of wife 12/21/2017  . Bilateral sensorineural hearing loss, followed by ENT and audiology 12/21/2017  . History of cutaneous T-cell lymphoma 12/21/2017  . History of adenomatous polyp of colon, removed by Dr. Ardis Hughs 4/13 12/21/2017  . Presbycusis of both ears 02/07/2016  . Primary osteoarthritis of right knee 10/20/2015  . SVT (supraventricular tachycardia) ([place]) 11/02/2012  .  Chronic insomnia 08/07/2012  . History of MI (myocardial infarction) 07/24/2011  . History of SCC (squamous cell carcinoma) of skin 07/24/2011  . MDD (major depressive disorder) 07/24/2011  . Chronic ITP (idiopathic thrombocytopenia) ([facility]) 06/14/2011  . Mycosis fungoides, stage 1 ([place]) 06/14/2011  . History of kidney stones 06/14/2011  . CAD (coronary artery disease) 03/21/2011  . Obstructive sleep apnea 09/14/2008  . Cardiomyopathy, ischemic 05/16/2008  . Essential hypertension, on Losartan, Coreg, ASA 07/10/2007  . Hyperlipidemia, on Lipitor 11/18/2006  . Allergic rhinitis, flonase prn 11/18/2006  . Diverticulosis of colon, last colonoscopy 4/18 11/18/2006  . Benign prostatic hyperplasia, uses saw palmetto 11/18/2006  . Osteoarthritis 11/18/2006   Social History   Tobacco Use  . Smoking status: Never Smoker  . Smokeless tobacco: Never Used  Substance Use Topics  . Alcohol use: No    Alcohol/week: 0.0 standard drinks  . Drug use: No   Current Medications and Allergies   .  acetaminophen (TYLENOL) 650 MG CR tablet, Take 650 mg by mouth 2 (two) times daily. , Disp: , Rfl:  .  ALPRAZolam (XANAX) 0.5 MG tablet, Take 1 tablet (0.5 mg total) by mouth 2 (two) times daily as needed for anxiety (for sleep)., Disp: 60 tablet, Rfl: 5 .  aspirin 81 MG tablet, Take 81 mg by mouth daily.  , Disp: ,  Rfl:  .  atorvastatin (LIPITOR) 20 MG tablet, Take 1 tablet (20 mg total) by mouth daily., Disp: 90 tablet, Rfl: 3 .  carvedilol (COREG) 3.125 MG tablet, Take 1 tablet (3.125 mg total) by mouth 2 (two) times daily with a meal., Disp: 180 tablet, Rfl: 3 .  fluticasone (FLONASE) 50 MCG/ACT nasal spray, PLACE 1 SPRAY INTO BOTH NOSTRILS 2 (TWO) TIMES DAILY., Disp: 16 g, Rfl: 2 .  fluticasone (FLOVENT HFA) 110 MCG/ACT inhaler, INHALE 2 PUFFS BY MOUTH 2 TIMES DAILY AS NEED, Disp: 36 Inhaler, Rfl: 1 .  losartan (COZAAR) 50 MG tablet, Take 1 tablet (50 mg total) by mouth daily., Disp: 90 tablet, Rfl: 3 .   Magnesium 250 MG TABS, Take 1 tablet by mouth daily., Disp: , Rfl:  .  nitroGLYCERIN (NITROSTAT) 0.4 MG SL tablet, Place 1 tablet (0.4 mg total) under the tongue every 5 (five) minutes as needed for chest pain (for chest pain)., Disp: 25 tablet, Rfl: 6 .  Omega-3 Fatty Acids (FISH OIL) 1000 MG CAPS, Take 1,000 mg by mouth 2 (two) times daily. , Disp: , Rfl:  .  predniSONE (DELTASONE) 5 MG tablet, 6-5-4-3-2-1, Disp: 21 tablet, Rfl: 0 .  Saw Palmetto 450 MG CAPS, Take 450 mg by mouth 2 (two) times daily. , Disp: , Rfl:  .  VITAMIN D PO, Take 1,000 mg by mouth daily., Disp: , Rfl:    Allergies  Allergen Reactions  . Other Other (See Comments)    ANTI-DEPRESSANTS. ANTI-DEPRESSANTS Antidepressants...has a variety of reactions to different antidepressants  . Prozac [Fluoxetine Hcl] Other (See Comments)    nightmares  . Amoxicillin   . Oxycodone Other (See Comments)    unknown   Review of Systems   Pertinent items are noted in the HPI. Otherwise, a complete ROS is negative.  Vitals   Vitals:   02/10/19 1007  BP: 110/70  Pulse: (!) 58  Temp: (!) 97.4 F (36.3 C)  TempSrc: Temporal  SpO2: 99%  Weight: 143 lb 6.4 oz (65 kg)  Height: 5\' 5"  (1.651 m)     Body mass index is 23.86 kg/m.  Physical Exam   Physical Exam Vitals signs and nursing note reviewed.  Constitutional:      General: He is not in acute distress.    Appearance: He is well-developed.  HENT:     Head: Normocephalic and atraumatic.     Right Ear: External ear normal.     Left Ear: External ear normal.     Nose: Nose normal.  Eyes:     Conjunctiva/sclera: Conjunctivae normal.     Pupils: Pupils are equal, round, and reactive to light.  Neck:     Musculoskeletal: Neck supple.  Cardiovascular:     Rate and Rhythm: Normal rate and regular rhythm.  Pulmonary:     Effort: Pulmonary effort is normal.  Abdominal:     General: Bowel sounds are normal.     Palpations: Abdomen is soft.  Musculoskeletal: Normal  range of motion.  Skin:    General: Skin is warm.  Neurological:     Mental Status: He is alert.  Psychiatric:        Behavior: Behavior normal.    Assessment and Plan   [name] was seen today for follow-up.  Diagnoses and all orders for this visit:  Essential hypertension, on Losartan, Coreg, ASA  B12 deficiency -     Vitamin B12  Malaise and fatigue -     CBC with Differential/Platelet -  Comprehensive metabolic panel -     TSH -     Magnesium -     Iron, TIBC and Ferritin Panel  Vitamin D deficiency -     VITAMIN D 25 Hydroxy (Vit-D Deficiency, Fractures)  Benign prostatic hyperplasia without lower urinary tract symptoms -     PSA  Need for immunization against influenza -     Flu Vaccine QUAD 36+ mos IM  Pure hypercholesterolemia -     Lipid panel    . Orders and follow up as documented in [facility], reviewed diet, exercise and weight control, cardiovascular risk and specific lipid/LDL goals reviewed, reviewed medications and side effects in detail.  . Reviewed expectations re: course of current medical issues. . Outlined signs and symptoms indicating need for more acute intervention. . Patient verbalized understanding and all questions were answered. . Patient received an After Visit Summary.  CMA served as Education administrator during this visit. History, Physical, and Plan performed by medical provider. The above documentation has been reviewed and is accurate and complete. Briscoe Graham, D.O.  Briscoe Deutscher, DO [place] 02/11/2019

## 2019-02-11 LAB — IRON,TIBC AND FERRITIN PANEL
%SAT: 51 % (calc) — ABNORMAL HIGH (ref 20–48)
Ferritin: 102 ng/mL (ref 24–380)
Iron: 142 ug/dL (ref 50–180)
TIBC: 277 mcg/dL (calc) (ref 250–425)

## 2019-02-19 ENCOUNTER — Encounter: Payer: Self-pay | Admitting: Family Medicine

## 2019-02-19 DIAGNOSIS — F418 Other specified anxiety disorders: Secondary | ICD-10-CM

## 2019-02-20 NOTE — Telephone Encounter (Signed)
Last fill for  Xanax 08/21/17 #60/0 Last OV 02/10/19 Last fill for Flonase 02/03/18 #16g/2

## 2019-02-22 MED ORDER — ALPRAZOLAM 0.5 MG PO TBDP: 0.5000 mg | ORAL_TABLET | Freq: Two times a day (BID) | ORAL | 0 refills | Status: DC | PRN

## 2019-02-22 MED ORDER — ALPRAZOLAM 0.5 MG PO TABS: 0.5000 mg | ORAL_TABLET | Freq: Two times a day (BID) | ORAL | 0 refills | Status: DC | PRN

## 2019-02-22 MED ORDER — FLUTICASONE PROPIONATE 50 MCG/ACT NA SUSP: 1.0000 | Freq: Two times a day (BID) | NASAL | 2 refills | Status: DC

## 2019-02-23 ENCOUNTER — Telehealth: Payer: Self-pay | Admitting: Family Medicine

## 2019-02-23 NOTE — Telephone Encounter (Signed)
See note  Copied from [place] [phone]. Topic: General - Other >> Feb 23, 2019  3:23 PM Leward Quan A wrote: Reason for CRM: [facility] called to say that they received duplicate Rx for medication  ALPRAZolam (XANAX) 0.5 MG tablet for patient one is tabs the other are dissolvable tabs. Please clarify with pharmacy Ph# [phone] opt# 2 Ref# [id number]

## 2019-02-25 ENCOUNTER — Telehealth: Payer: Self-pay

## 2019-02-25 NOTE — Telephone Encounter (Signed)
Duplicate message one sent to [facility]

## 2019-02-25 NOTE — Telephone Encounter (Signed)
Please advise 

## 2019-02-25 NOTE — Telephone Encounter (Signed)
Copied from [place] [id number]. Topic: General - Other >> Feb 23, 2019  3:23 PM Leward Quan A wrote: Reason for CRM: [facility] called to say that they received duplicate Rx for medication  ALPRAZolam (XANAX) 0.5 MG tablet for patient one is tabs the other are dissolvable tabs. Please clarify with pharmacy Ph# [phone] opt# 2 Ref# [id number] >> Feb 25, 2019  8:30 AM Carolyn Stare wrote:   [facility] ask if someone would call them back today     [facility] called to say that they received duplicate Rx for medication  ALPRAZolam (XANAX) 0.5 MG tablet for patient one is tabs the other are dissolvable tabs. Please clarify with pharmacy Ph# [phone] opt# 2 Ref# [id number]

## 2019-02-26 NOTE — Telephone Encounter (Signed)
Called pharmacy gave information will call if any questions.

## 2019-02-26 NOTE — Telephone Encounter (Signed)
Tabs.

## 2019-02-27 ENCOUNTER — Encounter: Payer: Self-pay | Admitting: Family Medicine

## 2019-03-01 ENCOUNTER — Encounter: Payer: Self-pay | Admitting: Family Medicine

## 2019-03-01 NOTE — Telephone Encounter (Signed)
Duplicate. Will delete. Briscoe Deutscher, DO

## 2019-03-02 ENCOUNTER — Telehealth: Payer: Self-pay | Admitting: Family Medicine

## 2019-03-02 DIAGNOSIS — F418 Other specified anxiety disorders: Secondary | ICD-10-CM

## 2019-03-02 NOTE — Telephone Encounter (Signed)
Patient calling and states that his prescriptions for  ALPRAZolam (XANAX) 0.5 MG tablet fluticasone (FLONASE) 50 MCG/ACT nasal spray  Are all messed up. States that these medications need to be sent in as a 90 day supply, not 30 day supplies. States that he is being charged double the price for the 30 day supplies. States this needs to be fixed asap. Would like a call back once this is corrected. Please advise.  [facility]

## 2019-03-02 NOTE — Telephone Encounter (Signed)
See note

## 2019-03-03 MED ORDER — FLUTICASONE PROPIONATE 50 MCG/ACT NA SUSP: 1.0000 | Freq: Two times a day (BID) | NASAL | 0 refills | Status: DC

## 2019-03-03 MED ORDER — ALPRAZOLAM 0.5 MG PO TABS: 0.5000 mg | ORAL_TABLET | Freq: Two times a day (BID) | ORAL | 0 refills | Status: DC | PRN

## 2019-03-03 NOTE — Telephone Encounter (Signed)
Completed.

## 2019-03-05 ENCOUNTER — Encounter: Payer: Self-pay | Admitting: Oncology

## 2019-03-27 ENCOUNTER — Encounter: Payer: Self-pay | Admitting: Cardiovascular Disease

## 2019-03-27 ENCOUNTER — Other Ambulatory Visit: Payer: Self-pay

## 2019-03-27 ENCOUNTER — Ambulatory Visit (INDEPENDENT_AMBULATORY_CARE_PROVIDER_SITE_OTHER): Payer: Medicare Other | Admitting: Cardiovascular Disease

## 2019-03-27 VITALS — BP 126/64 | HR 66 | Ht 65.0 in | Wt 145.1 lb

## 2019-03-27 DIAGNOSIS — E7800 Pure hypercholesterolemia, unspecified: Secondary | ICD-10-CM

## 2019-03-27 DIAGNOSIS — I10 Essential (primary) hypertension: Secondary | ICD-10-CM

## 2019-03-27 DIAGNOSIS — I2510 Atherosclerotic heart disease of native coronary artery without angina pectoris: Secondary | ICD-10-CM

## 2019-03-27 MED ORDER — ATORVASTATIN CALCIUM 20 MG PO TABS: 20.0000 mg | ORAL_TABLET | Freq: Every day | ORAL | 3 refills | Status: DC

## 2019-03-27 MED ORDER — LOSARTAN POTASSIUM 50 MG PO TABS: 50.0000 mg | ORAL_TABLET | Freq: Every day | ORAL | 3 refills | Status: DC

## 2019-03-27 NOTE — Progress Notes (Signed)
Chief Complaint  Patient presents with  . Follow-up    CAD   History of Present Illness: 78 yo male with history of CAD, HTN, hyperlipidemia, idiopathic thrombocytopenia and OSA here today for cardiac follow up. He had an anterior STEMI in January 2009 and had a drug eluting stent placed in the LAD. His ejection fraction improved from 25% to 40-50% following the first event. No evidence of AAA on abdominal aortic u/s December 2013. He has since been diagnosed with T-cell lymphoma but this is in remission. Nuclear stress test 2014 with scar in the anterior wall, apex and inferoapical wall with LVEF of 55%. No ischemic EKG changes. Cardiac cath May 2014 with mild to moderate CAD, no obstructive lesions. He has chronic left arm weakness and aching. No change with exertion. Echo November 2019 with LVEF=45-50%. Trivial AI, MR, TR.   He is here today for follow up. The patient denies any chest pain, dyspnea, palpitations, lower extremity edema, orthopnea, PND, dizziness, near syncope or syncope.    Primary Care Physician: Briscoe Deutscher, DO  Past Medical History:  Diagnosis Date  . Anxiety   . Arthritis of multiple joints   . Basal cell carcinoma of right scalp   . Benign prostatic hypertrophy   . CAD (coronary artery disease)   . Chronic anticoagulation 02/20/2016  . Chronic lower back pain, disk disease   . Depression   . Diverticulosis of colon   . Dysrhythmia   . Factor VII deficiency ([place])    10/28/15: No history of factor VII deficiency per Dr. Beryle Beams  . History of blood transfusion 1941  . History of colon polyps   . Hyperlipidemia   . ITP (idiopathic thrombocytopenic purpura)   . Kidney stones x 2, both passed   . Myocardial infarction ([place]) 2009  . Seasonal allergies   . Squamous carcinoma left scalp   . T-cell lymphoma ([place])    "I'm in stage I; original site was on my left chest; had a place around my waist treated last summer; got a place on back of my left leg"  (10/03/2012)    Past Surgical History:  Procedure Laterality Date  . CARDIAC CATHETERIZATION  10/03/2012  . CATARACT EXTRACTION W/ INTRAOCULAR LENS  IMPLANT, BILATERAL Bilateral 2000's  . COLONOSCOPY    . CORONARY ANGIOPLASTY WITH STENT PLACEMENT  05/29/2007   "1" (10/03/2012)  . CYST EXCISION  1980's?   "or fatty tumor; taken off my back" (10/03/2012)  . KNEE SURGERY     right  . LEFT HEART CATHETERIZATION WITH CORONARY ANGIOGRAM N/A 10/03/2012   Procedure: LEFT HEART CATHETERIZATION WITH CORONARY ANGIOGRAM;  Surgeon: Burnell Blanks, MD;  Location: [facility] C[facility] LAB;  Service: Cardiovascular;  Laterality: N/A;  . LUMBAR EPIDURAL INJECTION  ~ 2004  . MOHS SURGERY Bilateral 2011-2012   "top of my head" (10/03/2012)  . PARTIAL KNEE ARTHROPLASTY Right 11/08/2015   Procedure: RIGHT UNICOMPARTMENTAL KNEE;  Surgeon: Renette Butters, MD;  Location: [facility];  Service: Orthopedics;  Laterality: Right;  . TONSILLECTOMY  ~ 1945  . VASECTOMY      Current Outpatient Medications  Medication Sig Dispense Refill  . acetaminophen (TYLENOL) 650 MG CR tablet Take 650 mg by mouth 2 (two) times daily.     Marland Kitchen ALPRAZolam (XANAX) 0.5 MG tablet Take 1 tablet (0.5 mg total) by mouth 2 (two) times daily as needed for anxiety (for sleep). 180 tablet 0  . aspirin 81 MG tablet Take 81 mg by mouth daily.      Marland Kitchen  atorvastatin (LIPITOR) 20 MG tablet Take 1 tablet (20 mg total) by mouth daily. 90 tablet 3  . carvedilol (COREG) 3.125 MG tablet Take 1 tablet (3.125 mg total) by mouth 2 (two) times daily with a meal. 180 tablet 3  . fluticasone (FLONASE) 50 MCG/ACT nasal spray Place 1 spray into both nostrils 2 (two) times daily. 1 g 0  . fluticasone (FLOVENT HFA) 110 MCG/ACT inhaler INHALE 2 PUFFS BY MOUTH 2 TIMES DAILY AS NEED 36 Inhaler 1  . losartan (COZAAR) 50 MG tablet Take 1 tablet (50 mg total) by mouth daily. 90 tablet 3  . Magnesium 250 MG TABS Take 1 tablet by mouth daily.    . nitroGLYCERIN (NITROSTAT) 0.4 MG SL  tablet Place 1 tablet (0.4 mg total) under the tongue every 5 (five) minutes as needed for chest pain (for chest pain). 25 tablet 6  . Omega-3 Fatty Acids (FISH OIL) 1000 MG CAPS Take 1,000 mg by mouth 2 (two) times daily.     . Saw Palmetto 450 MG CAPS Take 450 mg by mouth 2 (two) times daily.     Marland Kitchen VITAMIN D PO Take 1,000 mg by mouth daily.     No current facility-administered medications for this visit.     Allergies  Allergen Reactions  . Other Other (See Comments)    ANTI-DEPRESSANTS. ANTI-DEPRESSANTS Antidepressants...has a variety of reactions to different antidepressants  . Prozac [Fluoxetine Hcl] Other (See Comments)    nightmares  . Oxycodone Other (See Comments)    unknown    Social History   Socioeconomic History  . Marital status: Married    Spouse name: Not on file  . Number of children: 2  . Years of education: Not on file  . Highest education level: Not on file  Occupational History  . Occupation: Retired  Scientific laboratory technician  . Financial resource strain: Not on file  . Food insecurity    Worry: Not on file    Inability: Not on file  . Transportation needs    Medical: Not on file    Non-medical: Not on file  Tobacco Use  . Smoking status: Never Smoker  . Smokeless tobacco: Never Used  Substance and Sexual Activity  . Alcohol use: No    Alcohol/week: 0.0 standard drinks  . Drug use: No  . Sexual activity: Not Currently  Lifestyle  . Physical activity    Days per week: Not on file    Minutes per session: Not on file  . Stress: Not on file  Relationships  . Social Herbalist on phone: Not on file    Gets together: Not on file    Attends religious service: Not on file    Active member of club or organization: Not on file    Attends meetings of clubs or organizations: Not on file    Relationship status: Not on file  . Intimate partner violence    Fear of current or ex partner: Not on file    Emotionally abused: Not on file    Physically  abused: Not on file    Forced sexual activity: Not on file  Other Topics Concern  . Not on file  Social History Narrative  . Not on file    Family History  Problem Relation Age of Onset  . Alzheimer's disease Mother   . Lymphoma Father   . Coronary artery disease Brother   . COPD Brother   . Diabetes Brother   . Colon  cancer Paternal Uncle   . Diabetes Brother     Review of Systems:  As stated in the HPI and otherwise negative.   BP 126/64   Pulse 66   Ht 5\' 5"  (1.651 m)   Wt 145 lb 1.9 oz (65.8 kg)   SpO2 96%   BMI 24.15 kg/m   Physical Examination:  General: Well developed, well nourished, NAD  HEENT: OP clear, mucus membranes moist  SKIN: warm, dry. No rashes. Neuro: No focal deficits  Musculoskeletal: Muscle strength 5/5 all ext  Psychiatric: Mood and affect normal  Neck: No JVD, no carotid bruits, no thyromegaly, no lymphadenopathy.  Lungs:Clear bilaterally, no wheezes, rhonci, crackles Cardiovascular: Regular rate and rhythm. No murmurs, gallops or rubs. Abdomen:Soft. Bowel sounds present. Non-tender.  Extremities: No lower extremity edema. Pulses are 2 + in the bilateral DP/PT.   Cardiac cath may 2014: Left main: No obstructive disease.  Left Anterior Descending Artery: Large caliber vessel that courses to the apex. The proximal vessel has mild plaque disease. The mid stented segment is patent. The small caliber diagonal branch is patent with ostial 70% stenosis as it is jailed by the stent and unchanged in appearance.  Circumflex Artery: Moderate caliber vessel with moderate caliber obtuse marginal branch with no significant disease. The AV groove Circumflex has a 40% stenosis just after the takeoff of the OM branch, unchanged.  Right Coronary Artery: Large, dominant vessel with serial 20% lesions throughout the proximal vessel. The mid vessel has a 40% stenosis. The distal vessel has mild plaque disease.  Left Ventricular Angiogram: LVEF 55% with anterior and  apical hypokinesis.   EKG:  EKG is ordered today. The ekg ordered today demonstrates NSR, rate 66 bpm. Poor R wave progression precordial leads  Echo November 2019:  - Left ventricle: The cavity size was normal. Systolic function was   mildly reduced. The estimated ejection fraction was in the range   of 45% to 50%. There is akinesis of the apical myocardium. There   was an increased relative contribution of atrial contraction to   ventricular filling. Doppler parameters are consistent with   abnormal left ventricular relaxation (grade 1 diastolic   dysfunction). - Aortic valve: Trileaflet; normal thickness, mildly calcified   leaflets. There was trivial regurgitation. - Mitral valve: Calcified annulus. There was trivial regurgitation. - Tricuspid valve: There was trivial regurgitation. - Pulmonic valve: There was trivial regurgitation. - Pulmonary arteries: PA peak pressure: 34 mm Hg (S).   Recent Labs: 02/10/2019: ALT 20; BUN 14; Creatinine, Ser 0.89; Hemoglobin 16.0; Magnesium 2.3; Platelets 121.0; Potassium 4.9; Sodium 139; TSH 1.91   Lipid Panel Lipid Panel     Component Value Date/Time   CHOL 122 02/10/2019 1054   TRIG 64.0 02/10/2019 1054   HDL 62.20 02/10/2019 1054   CHOLHDL 2 02/10/2019 1054   VLDL 12.8 02/10/2019 1054   LDLCALC 47 02/10/2019 1054   LDLDIRECT 137.3 08/08/2006 1059      Wt Readings from Last 3 Encounters:  03/27/19 145 lb 1.9 oz (65.8 kg)  02/10/19 143 lb 6.4 oz (65 kg)  12/03/18 150 lb 4.8 oz (68.2 kg)     Other studies Reviewed: Additional studies/ records that were reviewed today include: . Review of the above records demonstrates:   Assessment and Plan:   1. CAD without angina: No chest pain. Continue ASA, statin and beta blocker.   2. HTN: BP is well controlled.   3. Hyperlipidemia: Lipids well controlled. Continue statin.   Current medicines  are reviewed at length with the patient today.  The patient does not have concerns  regarding medicines.  The following changes have been made:  no change  Labs/ tests ordered today include:   Orders Placed This Encounter  Procedures  . EKG 12-Lead    Disposition:   FU with me in 12 months  Signed, Lauree Chandler, MD 03/27/2019 3:20 PM    [facility] [address], [place], [place]  [address] Phone: [phone]; Fax: [phone]

## 2019-03-27 NOTE — Patient Instructions (Signed)
Medication Instructions:  Your provider recommends that you continue on your current medications as directed. Please refer to the Current Medication list given to you today.   *If you need a refill on your cardiac medications before your next appointment, please call your pharmacy*  Lab Work: None If you have labs (blood work) drawn today and your tests are completely normal, you will receive your results only by: Marland Kitchen MyChart Message (if you have MyChart) OR . A paper copy in the mail If you have any lab test that is abnormal or we need to change your treatment, we will call you to review the results.  Testing/Procedures: None  Follow-Up: At [facility], you and your health needs are our priority.  As part of our continuing mission to provide you with exceptional heart care, we have created designated Provider Care Teams.  These Care Teams include your primary Cardiologist (physician) and Advanced Practice Providers (APPs -  Physician Assistants and Nurse Practitioners) who all work together to provide you with the care you need, when you need it. Your next appointment:   12 months The format for your next appointment:   In Person Provider:   You may see Lauree Chandler, MD or one of the following Advanced Practice Providers on your designated Care Team:    Melina Copa, PA-C  Ermalinda Barrios, PA-C

## 2019-04-01 DIAGNOSIS — I2510 Atherosclerotic heart disease of native coronary artery without angina pectoris: Secondary | ICD-10-CM

## 2019-04-02 MED ORDER — CARVEDILOL 3.125 MG PO TABS: 3.1250 mg | ORAL_TABLET | Freq: Two times a day (BID) | ORAL | 3 refills | Status: DC

## 2019-04-08 ENCOUNTER — Other Ambulatory Visit: Payer: Self-pay

## 2019-05-27 ENCOUNTER — Other Ambulatory Visit: Payer: Self-pay

## 2019-05-28 ENCOUNTER — Ambulatory Visit (INDEPENDENT_AMBULATORY_CARE_PROVIDER_SITE_OTHER): Payer: Medicare Other | Admitting: Family Medicine

## 2019-05-28 ENCOUNTER — Encounter: Payer: Self-pay | Admitting: Family Medicine

## 2019-05-28 VITALS — BP 110/60 | HR 61 | Temp 98.6°F | Ht 65.0 in | Wt 144.2 lb

## 2019-05-28 DIAGNOSIS — I2510 Atherosclerotic heart disease of native coronary artery without angina pectoris: Secondary | ICD-10-CM | POA: Diagnosis not present

## 2019-05-28 DIAGNOSIS — N400 Enlarged prostate without lower urinary tract symptoms: Secondary | ICD-10-CM | POA: Diagnosis not present

## 2019-05-28 DIAGNOSIS — D682 Hereditary deficiency of other clotting factors: Secondary | ICD-10-CM | POA: Diagnosis not present

## 2019-05-28 DIAGNOSIS — F418 Other specified anxiety disorders: Secondary | ICD-10-CM | POA: Diagnosis not present

## 2019-05-28 DIAGNOSIS — I10 Essential (primary) hypertension: Secondary | ICD-10-CM | POA: Diagnosis not present

## 2019-05-28 DIAGNOSIS — F331 Major depressive disorder, recurrent, moderate: Secondary | ICD-10-CM | POA: Diagnosis not present

## 2019-05-28 DIAGNOSIS — D693 Immune thrombocytopenic purpura: Secondary | ICD-10-CM | POA: Diagnosis not present

## 2019-05-28 DIAGNOSIS — Z8572 Personal history of non-Hodgkin lymphomas: Secondary | ICD-10-CM

## 2019-05-28 DIAGNOSIS — E7800 Pure hypercholesterolemia, unspecified: Secondary | ICD-10-CM | POA: Diagnosis not present

## 2019-05-28 MED ORDER — ALPRAZOLAM 0.5 MG PO TABS: 0.5000 mg | ORAL_TABLET | Freq: Two times a day (BID) | ORAL | 1 refills | Status: AC | PRN

## 2019-05-28 NOTE — Assessment & Plan Note (Signed)
Stable.  Continue management per heme-onc.

## 2019-05-28 NOTE — Assessment & Plan Note (Signed)
Stable.  Continue atorvastatin 20 mg daily. 

## 2019-05-28 NOTE — Assessment & Plan Note (Signed)
Stable. Continue saw palmetto.

## 2019-05-28 NOTE — Progress Notes (Signed)
   [name] is a 79 y.o. male who presents today for an office visit.  Assessment/Plan:  Chronic Problems Addressed Today: Chronic ITP (idiopathic thrombocytopenia) (HCC) Stable.  Continue management per heme-onc.  CAD (coronary artery disease) Continue aspirin and statin.  Factor VII deficiency ([place]) Continue management per heme-onc.  History of cutaneous T-cell lymphoma Continue management per dermatology.  Essential hypertension At goal. Continue coreg 3.125mg  twice daily and losartan 50mg  daily.   Hyperlipidemia Stable.  Continue atorvastatin 20 mg daily.  Benign prostatic hyperplasia Stable. Continue saw palmetto.      Subjective:  HPI:  Patient is here to transfer care.  Was previously with Dr. Also since last office.  Today is doing well.  Request refill on his Xanax.  His chronic conditions are outlined below.   # Essential hypertension -On Coreg 3.125 mg twice daily and losartan 50 mg daily.  #Dyslipidemia / CAD s/p stenting 2014 - Follows with caridiology -On aspirin 81 mg daily. - On Lipitor 10 mg daily tolerating well  Anxiety -On Xanax 0.5 mg twice daily as needed.  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  # BPH - On Saw Palmetto       Objective:  Physical Exam: BP 110/60   Pulse 61   Temp 98.6 F (37 C)   Ht 5\' 5"  (1.651 m)   Wt 144 lb 4 oz (65.4 kg)   SpO2 97%   BMI 24.00 kg/m   Gen: No acute distress, resting comfortably CV: Regular rate and rhythm with no murmurs appreciated Pulm: Normal work of breathing, clear to auscultation bilaterally with no crackles, wheezes, or rhonchi MSK: Right arm without abnormalities or deformities.  Chest wall nontender to palpation. Neuro: Grossly normal, moves all extremities Psych: Normal affect and thought content  Time Spent: 55 minutes of total time was spent on the date of the encounter performing the  following actions: chart review prior to seeing the patient, obtaining history, performing a medically necessary exam, counseling on the treatment plan, placing orders, and documenting in our EHR.       [name]. Jerline Pain, MD 05/28/2019 3:14 PM

## 2019-05-28 NOTE — Patient Instructions (Signed)
It was very nice to see you today!  I will refill your Xanax.  No other medication changes today.  Come back in 1 year for your next checkup, or sooner if needed.  Take care, Dr Jerline Pain  Please try these tips to maintain a healthy lifestyle:   Eat at least 3 REAL meals and 1-2 snacks per day.  Aim for no more than 5 hours between eating.  If you eat breakfast, please do so within one hour of getting up.    Each meal should contain half fruits/vegetables, one quarter protein, and one quarter carbs (no bigger than a computer mouse)   Cut down on sweet beverages. This includes juice, soda, and sweet tea.     Drink at least 1 glass of water with each meal and aim for at least 8 glasses per day   Exercise at least 150 minutes every week.

## 2019-05-28 NOTE — Assessment & Plan Note (Signed)
Continue management per dermatology.  

## 2019-05-28 NOTE — Assessment & Plan Note (Signed)
At goal. Continue coreg 3.125mg  twice daily and losartan 50mg  daily.

## 2019-05-28 NOTE — Assessment & Plan Note (Signed)
Continue management per heme-onc.

## 2019-05-28 NOTE — Assessment & Plan Note (Signed)
Continue aspirin and statin. 

## 2019-05-31 ENCOUNTER — Encounter: Payer: Self-pay | Admitting: Family Medicine

## 2019-06-01 ENCOUNTER — Encounter: Payer: Self-pay | Admitting: Family Medicine

## 2019-06-01 NOTE — Telephone Encounter (Signed)
Added to open message that was sent to Dr. Jerline Pain.

## 2019-06-01 NOTE — Telephone Encounter (Signed)
Added from second message from patient    forgot to mention that I have been using Lotramin AF for jock itch.  Don't think that would cause this.  I would think that the Lotramin AF would be local.  My thought is the vaccine and there is some mild itch, not enough to scratch.  Thinking back I have had this problem twice after injections in my knee and in hip. At that time after the injections I would get a cough and Dr. [name] would do a chest x-Ems and put me on medication then the rash or purpura would show up and we thought that it might have been the medication that caused the rash.  I am thinking that there must be something used in injections and shots that is causing this.  Your thoughts. Maybe take Benadryl?

## 2019-06-03 DIAGNOSIS — Z23 Encounter for immunization: Secondary | ICD-10-CM | POA: Diagnosis not present

## 2019-06-03 DIAGNOSIS — L821 Other seborrheic keratosis: Secondary | ICD-10-CM | POA: Diagnosis not present

## 2019-06-03 DIAGNOSIS — L986 Other infiltrative disorders of the skin and subcutaneous tissue: Secondary | ICD-10-CM | POA: Diagnosis not present

## 2019-06-03 DIAGNOSIS — L570 Actinic keratosis: Secondary | ICD-10-CM | POA: Diagnosis not present

## 2019-06-03 DIAGNOSIS — B353 Tinea pedis: Secondary | ICD-10-CM | POA: Diagnosis not present

## 2019-06-03 DIAGNOSIS — Z85828 Personal history of other malignant neoplasm of skin: Secondary | ICD-10-CM | POA: Diagnosis not present

## 2019-06-03 DIAGNOSIS — D044 Carcinoma in situ of skin of scalp and neck: Secondary | ICD-10-CM | POA: Diagnosis not present

## 2019-06-03 DIAGNOSIS — R233 Spontaneous ecchymoses: Secondary | ICD-10-CM | POA: Diagnosis not present

## 2019-06-03 DIAGNOSIS — D485 Neoplasm of uncertain behavior of skin: Secondary | ICD-10-CM | POA: Diagnosis not present

## 2019-06-03 DIAGNOSIS — D2271 Melanocytic nevi of right lower limb, including hip: Secondary | ICD-10-CM | POA: Diagnosis not present

## 2019-06-05 ENCOUNTER — Inpatient Hospital Stay: Payer: Medicare Other

## 2019-06-05 ENCOUNTER — Other Ambulatory Visit: Payer: Self-pay

## 2019-06-05 ENCOUNTER — Inpatient Hospital Stay: Payer: Medicare Other | Attending: Oncology | Admitting: Oncology

## 2019-06-05 VITALS — BP 148/77 | HR 61 | Temp 98.9°F | Resp 18 | Ht 65.0 in | Wt 146.2 lb

## 2019-06-05 DIAGNOSIS — Z8572 Personal history of non-Hodgkin lymphomas: Secondary | ICD-10-CM | POA: Diagnosis not present

## 2019-06-05 DIAGNOSIS — Z79899 Other long term (current) drug therapy: Secondary | ICD-10-CM | POA: Diagnosis not present

## 2019-06-05 DIAGNOSIS — D693 Immune thrombocytopenic purpura: Secondary | ICD-10-CM | POA: Insufficient documentation

## 2019-06-05 DIAGNOSIS — L539 Erythematous condition, unspecified: Secondary | ICD-10-CM | POA: Diagnosis not present

## 2019-06-05 DIAGNOSIS — I2510 Atherosclerotic heart disease of native coronary artery without angina pectoris: Secondary | ICD-10-CM | POA: Diagnosis not present

## 2019-06-05 DIAGNOSIS — Z7982 Long term (current) use of aspirin: Secondary | ICD-10-CM | POA: Diagnosis not present

## 2019-06-05 LAB — CBC WITH DIFFERENTIAL (CANCER CENTER ONLY)
Abs Immature Granulocytes: 0.01 10*3/uL (ref 0.00–0.07)
Basophils Absolute: 0 10*3/uL (ref 0.0–0.1)
Basophils Relative: 1 %
Eosinophils Absolute: 0.2 10*3/uL (ref 0.0–0.5)
Eosinophils Relative: 3 %
HCT: 48.3 % (ref 39.0–52.0)
Hemoglobin: 15.7 g/dL (ref 13.0–17.0)
Immature Granulocytes: 0 %
Lymphocytes Relative: 29 %
Lymphs Abs: 1.4 10*3/uL (ref 0.7–4.0)
MCH: 32.5 pg (ref 26.0–34.0)
MCHC: 32.5 g/dL (ref 30.0–36.0)
MCV: 100 fL (ref 80.0–100.0)
Monocytes Absolute: 0.5 10*3/uL (ref 0.1–1.0)
Monocytes Relative: 10 %
Neutro Abs: 2.8 10*3/uL (ref 1.7–7.7)
Neutrophils Relative %: 57 %
Platelet Count: 112 10*3/uL — ABNORMAL LOW (ref 150–400)
RBC: 4.83 MIL/uL (ref 4.22–5.81)
RDW: 12.6 % (ref 11.5–15.5)
WBC Count: 5 10*3/uL (ref 4.0–10.5)
nRBC: 0 % (ref 0.0–0.2)

## 2019-06-05 LAB — CMP (CANCER CENTER ONLY)
ALT: 24 U/L (ref 0–44)
AST: 22 U/L (ref 15–41)
Albumin: 3.9 g/dL (ref 3.5–5.0)
Alkaline Phosphatase: 72 U/L (ref 38–126)
Anion gap: 7 (ref 5–15)
BUN: 15 mg/dL (ref 8–23)
CO2: 28 mmol/L (ref 22–32)
Calcium: 9 mg/dL (ref 8.9–10.3)
Chloride: 105 mmol/L (ref 98–111)
Creatinine: 0.86 mg/dL (ref 0.61–1.24)
GFR, Est AFR Am: >60 mL/min (ref >60)
GFR, Estimated: >60 mL/min (ref >60)
Glucose, Bld: 84 mg/dL (ref 70–99)
Potassium: 4.8 mmol/L (ref 3.5–5.1)
Sodium: 140 mmol/L (ref 135–145)
Total Bilirubin: 1.1 mg/dL (ref 0.3–1.2)
Total Protein: 6.4 g/dL — ABNORMAL LOW (ref 6.5–8.1)

## 2019-06-05 NOTE — Progress Notes (Signed)
Hematology and Oncology Follow Up Visit  [name] [id number] [birth year] 79 y.o. 06/05/2019 11:55 AM [name], MDWallace, Johnathan Chen, DO   Principle Diagnosis: 79 year old man with:  1.  Thrombocytopenia diagnosed in 2009.  He was found to have ITP and has not required treatment since that time.  2.  Cutaneous T-cell lymphoma diagnosed in 2012.  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   Prior Therapy:  Topical therapy for his T-cell cutaneous lymphoma.  He did not require any treatment for his thrombocytopenia.    Current therapy: Active surveillance.  Interim History: [name] is here for return evaluation.  Since the last visit, he received is a Covid vaccine last week and subsequently developed a diffuse erythematous rash on his lower extremities.  He denies any pruritus or any raised lesions.  He was evaluated by dermatology and a biopsy was obtained.  Results are pending.  He has feels overall well without any major concerns at this time.  He remains active and continues to attempt activities of daily living.   Medications: Updated on review. Current Outpatient Medications  Medication Sig Dispense Refill  . acetaminophen (TYLENOL) 650 MG CR tablet Take 650 mg by mouth 2 (two) times daily.     Marland Kitchen ALPRAZolam (XANAX) 0.5 MG tablet Take 1 tablet (0.5 mg total) by mouth 2 (two) times daily as needed for anxiety (for sleep). 180 tablet 1  . aspirin 81 MG tablet Take 81 mg by mouth daily.      Marland Kitchen atorvastatin (LIPITOR) 20 MG tablet Take 1 tablet (20 mg total) by mouth daily. 90 tablet 3  . carvedilol (COREG) 3.125 MG tablet Take 1 tablet (3.125 mg total) by mouth 2 (two) times daily with a meal. 180 tablet 3  . fluticasone (FLONASE) 50 MCG/ACT nasal spray Place 1 spray into both nostrils 2 (two) times daily. 1 g 0  . fluticasone (FLOVENT HFA) 110 MCG/ACT inhaler INHALE 2 PUFFS BY MOUTH 2 TIMES DAILY AS NEED 36 Inhaler 1  .  losartan (COZAAR) 50 MG tablet Take 1 tablet (50 mg total) by mouth daily. 90 tablet 3  . Magnesium 250 MG TABS Take 1 tablet by mouth daily.    . nitroGLYCERIN (NITROSTAT) 0.4 MG SL tablet Place 1 tablet (0.4 mg total) under the tongue every 5 (five) minutes as needed for chest pain (for chest pain). 25 tablet 6  . Omega-3 Fatty Acids (FISH OIL) 1000 MG CAPS Take 1,000 mg by mouth 2 (two) times daily.     . Saw Palmetto 450 MG CAPS Take 450 mg by mouth 2 (two) times daily.     Marland Kitchen VITAMIN D PO Take 1,000 mg by mouth daily.     No current facility-administered medications for this visit.     Allergies:  Allergies  Allergen Reactions  . Other Other (See Comments)    ANTI-DEPRESSANTS. ANTI-DEPRESSANTS Antidepressants...has a variety of reactions to different antidepressants  . Prozac [Fluoxetine Hcl] Other (See Comments)    nightmares  . Oxycodone Other (See Comments)    unknown        Physical Exam: Blood pressure (!) 148/77, pulse 61, temperature 98.9 F (37.2 C), temperature source Temporal, resp. rate 18, height 5\' 5"  (1.651 m), weight 146 lb 3.2 oz (66.3 kg), SpO2 99 %. ECOG: 1    General appearance: Comfortable appearing without any discomfort Head: Normocephalic without any trauma Oropharynx: Mucous membranes are moist and pink without any  thrush or ulcers. Eyes: Pupils are equal and round reactive to light. Lymph nodes: No cervical, supraclavicular, inguinal or axillary lymphadenopathy.   Heart:regular rate and rhythm.  S1 and S2 without leg edema. Lung: Clear without any rhonchi or wheezes.  No dullness to percussion. Abdomin: Soft, nontender, nondistended with good bowel sounds.  No hepatosplenomegaly. Musculoskeletal: No joint deformity or effusion.  Full range of motion noted. Neurological: No deficits noted on motor, sensory and deep tendon reflex exam. Skin: Diffuse flat erythematous lesions noted on his lower extremity and back.  No petechia or  ecchymosis.    Lab Results: Lab Results  Component Value Date   WBC 5.0 06/05/2019   HGB 15.7 06/05/2019   HCT 48.3 06/05/2019   MCV 100.0 06/05/2019   PLT 112 (L) 06/05/2019     Chemistry      Component Value Date/Time   NA 140 06/05/2019 1118   NA 140 03/18/2018 1126   NA 142 03/23/2013 1424   K 4.8 06/05/2019 1118   K 4.7 03/23/2013 1424   CL 105 06/05/2019 1118   CL 106 09/22/2012 1428   CO2 28 06/05/2019 1118   CO2 28 03/23/2013 1424   BUN 15 06/05/2019 1118   BUN 15 03/18/2018 1126   BUN 18.1 03/23/2013 1424   CREATININE 0.86 06/05/2019 1118   CREATININE 0.85 10/25/2014 1040   CREATININE 0.8 03/23/2013 1424      Component Value Date/Time   CALCIUM 9.0 06/05/2019 1118   CALCIUM 9.4 03/23/2013 1424   ALKPHOS 72 06/05/2019 1118   ALKPHOS 62 03/23/2013 1424   AST 22 06/05/2019 1118   AST 25 03/23/2013 1424   ALT 24 06/05/2019 1118   ALT 27 03/23/2013 1424   BILITOT 1.1 06/05/2019 1118   BILITOT 0.86 03/23/2013 1424        Impression and Plan:  79 year old man with:  1.    Thrombocytopenia diagnosed in 2009.  This was a presumed ITP and has not required any treatment at this time.  He remains on active surveillance without any need for treatment at this time.  Laboratory data from today reviewed and showed a platelet count of 112.  The natural course of this disease was reiterated and at this time no indication for treatment.  Trial of steroids or IVIG would be a possibility in the future if needed.  2.    Mycosis fungoides cutaneous T-cell lymphoma diagnosed in 2012.  He presented with a plaque on his chest and has resolved after topical treatments.  He does have a recent rash on his lower extremities but does not look consistent with cutaneous T-cell lymphoma.  Biopsy has been obtained and currently pending.  I recommended monitoring at this time and dermatology follow-up.  3.  Follow-up: He will return in 6 months for repeat evaluation.  20 minutes  was spent on this encounter.  Time was dedicated to reviewing his disease status, reviewing laboratory, differential diagnosis and management options for the future.     Zola Button, MD 1/22/202111:55 AM

## 2019-06-08 ENCOUNTER — Telehealth: Payer: Self-pay | Admitting: Oncology

## 2019-06-08 NOTE — Telephone Encounter (Signed)
Scheduled appt per 1/22 los.  Sent a message to HIM pool to get a calendar mailed out. 

## 2019-06-30 DIAGNOSIS — D044 Carcinoma in situ of skin of scalp and neck: Secondary | ICD-10-CM | POA: Diagnosis not present

## 2019-06-30 DIAGNOSIS — Z23 Encounter for immunization: Secondary | ICD-10-CM | POA: Diagnosis not present

## 2019-07-13 ENCOUNTER — Other Ambulatory Visit: Payer: Self-pay

## 2019-07-13 ENCOUNTER — Ambulatory Visit (INDEPENDENT_AMBULATORY_CARE_PROVIDER_SITE_OTHER): Payer: Medicare Other

## 2019-07-13 DIAGNOSIS — Z0000 Encounter for general adult medical examination without abnormal findings: Secondary | ICD-10-CM

## 2019-07-13 NOTE — Patient Instructions (Signed)
[name] , Thank you for taking time to come for your Medicare Wellness Visit. I appreciate your ongoing commitment to your health goals. Please review the following plan we discussed and let me know if I can assist you in the future.   Screening recommendations/referrals: Colorectal Screening: up to date; last colonoscopy 09/07/16  Vision and Dental Exams: Recommended annual ophthalmology exams for early detection of glaucoma and other disorders of the eye Recommended annual dental exams for proper oral hygiene  Vaccinations: Influenza vaccine: completed 02/10/19 Pneumococcal vaccine: up to date; last 08/10/13 Tdap vaccine: up to date; last 06/13/09 (repeat every 10 years) Shingles vaccine: follow advice of oncologist   Advanced directives: Please bring a copy of your POA (Power of [place]) and/or Living Will to your next appointment.  Goals: Recommend to drink at least 6-8 8oz glasses of water per day and consume a balanced diet rich in fresh fruits and vegetables.   Next appointment: Please schedule your Annual Wellness Visit with your Nurse Health Advisor in one year.  Preventive Care 5 Years and Older, Male Preventive care refers to lifestyle choices and visits with your health care provider that can promote health and wellness. What does preventive care include?  A yearly physical exam. This is also called an annual well check.  Dental exams once or twice a year.  Routine eye exams. Ask your health care provider how often you should have your eyes checked.  Personal lifestyle choices, including:  Daily care of your teeth and gums.  Regular physical activity.  Eating a healthy diet.  Avoiding tobacco and drug use.  Limiting alcohol use.  Practicing safe sex.  Taking low doses of aspirin every day if recommended by your health care provider..  Taking vitamin and mineral supplements as recommended by your health care provider. What happens during an annual well  check? The services and screenings done by your health care provider during your annual well check will depend on your age, overall health, lifestyle risk factors, and family history of disease. Counseling  Your health care provider may ask you questions about your:  Alcohol use.  Tobacco use.  Drug use.  Emotional well-being.  Home and relationship well-being.  Sexual activity.  Eating habits.  History of falls.  Memory and ability to understand (cognition).  Work and work Statistician. Screening  You may have the following tests or measurements:  Height, weight, and BMI.  Blood pressure.  Lipid and cholesterol levels. These may be checked every 5 years, or more frequently if you are over 11 years old.  Skin check.  Lung cancer screening. You may have this screening every year starting at age 29 if you have a 30-pack-year history of smoking and currently smoke or have quit within the past 15 years.  Fecal occult blood test (FOBT) of the stool. You may have this test every year starting at age 57.  Flexible sigmoidoscopy or colonoscopy. You may have a sigmoidoscopy every 5 years or a colonoscopy every 10 years starting at age 82.  Prostate cancer screening. Recommendations will vary depending on your family history and other risks.  Hepatitis C blood test.  Hepatitis B blood test.  Sexually transmitted disease (STD) testing.  Diabetes screening. This is done by checking your blood sugar (glucose) after you have not eaten for a while (fasting). You may have this done every 1-3 years.  Abdominal aortic aneurysm (AAA) screening. You may need this if you are a current or former smoker.  Osteoporosis.  You may be screened starting at age 37 if you are at high risk. Talk with your health care provider about your test results, treatment options, and if necessary, the need for more tests. Vaccines  Your health care provider may recommend certain vaccines, such  as:  Influenza vaccine. This is recommended every year.  Tetanus, diphtheria, and acellular pertussis (Tdap, Td) vaccine. You may need a Td booster every 10 years.  Zoster vaccine. You may need this after age 93.  Pneumococcal 13-valent conjugate (PCV13) vaccine. One dose is recommended after age 18.  Pneumococcal polysaccharide (PPSV23) vaccine. One dose is recommended after age 95. Talk to your health care provider about which screenings and vaccines you need and how often you need them. This information is not intended to replace advice given to you by your health care provider. Make sure you discuss any questions you have with your health care provider. Document Released: 05/27/2015 Document Revised: 01/18/2016 Document Reviewed: 03/01/2015 Elsevier Interactive Patient Education  2017 [place] Prevention in the Home Falls can cause injuries. They can happen to people of all ages. There are many things you can do to make your home safe and to help prevent falls. What can I do on the outside of my home?  Regularly fix the edges of walkways and driveways and fix any cracks.  Remove anything that might make you trip as you walk through a door, such as a raised step or threshold.  Trim any bushes or trees on the path to your home.  Use bright outdoor lighting.  Clear any walking paths of anything that might make someone trip, such as rocks or tools.  Regularly check to see if handrails are loose or broken. Make sure that both sides of any steps have handrails.  Any raised decks and porches should have guardrails on the edges.  Have any leaves, snow, or ice cleared regularly.  Use sand or salt on walking paths during winter.  Clean up any spills in your garage right away. This includes oil or grease spills. What can I do in the bathroom?  Use night lights.  Install grab bars by the toilet and in the tub and shower. Do not use towel bars as grab bars.  Use  non-skid mats or decals in the tub or shower.  If you need to sit down in the shower, use a plastic, non-slip stool.  Keep the floor dry. Clean up any water that spills on the floor as soon as it happens.  Remove soap buildup in the tub or shower regularly.  Attach bath mats securely with double-sided non-slip rug tape.  Do not have throw rugs and other things on the floor that can make you trip. What can I do in the bedroom?  Use night lights.  Make sure that you have a light by your bed that is easy to reach.  Do not use any sheets or blankets that are too big for your bed. They should not hang down onto the floor.  Have a firm chair that has side arms. You can use this for support while you get dressed.  Do not have throw rugs and other things on the floor that can make you trip. What can I do in the kitchen?  Clean up any spills right away.  Avoid walking on wet floors.  Keep items that you use a lot in easy-to-reach places.  If you need to reach something above you, use a strong step stool  that has a grab bar.  Keep electrical cords out of the way.  Do not use floor polish or wax that makes floors slippery. If you must use wax, use non-skid floor wax.  Do not have throw rugs and other things on the floor that can make you trip. What can I do with my stairs?  Do not leave any items on the stairs.  Make sure that there are handrails on both sides of the stairs and use them. Fix handrails that are broken or loose. Make sure that handrails are as long as the stairways.  Check any carpeting to make sure that it is firmly attached to the stairs. Fix any carpet that is loose or worn.  Avoid having throw rugs at the top or bottom of the stairs. If you do have throw rugs, attach them to the floor with carpet tape.  Make sure that you have a light switch at the top of the stairs and the bottom of the stairs. If you do not have them, ask someone to add them for you. What  else can I do to help prevent falls?  Wear shoes that:  Do not have high heels.  Have rubber bottoms.  Are comfortable and fit you well.  Are closed at the toe. Do not wear sandals.  If you use a stepladder:  Make sure that it is fully opened. Do not climb a closed stepladder.  Make sure that both sides of the stepladder are locked into place.  Ask someone to hold it for you, if possible.  Clearly mark and make sure that you can see:  Any grab bars or handrails.  First and last steps.  Where the edge of each step is.  Use tools that help you move around (mobility aids) if they are needed. These include:  Canes.  Walkers.  Scooters.  Crutches.  Turn on the lights when you go into a dark area. Replace any light bulbs as soon as they burn out.  Set up your furniture so you have a clear path. Avoid moving your furniture around.  If any of your floors are uneven, fix them.  If there are any pets around you, be aware of where they are.  Review your medicines with your doctor. Some medicines can make you feel dizzy. This can increase your chance of falling. Ask your doctor what other things that you can do to help prevent falls. This information is not intended to replace advice given to you by your health care provider. Make sure you discuss any questions you have with your health care provider. Document Released: 02/24/2009 Document Revised: 10/06/2015 Document Reviewed: 06/04/2014 Elsevier Interactive Patient Education  2017 [employer].

## 2019-07-13 NOTE — Progress Notes (Signed)
This visit is being conducted via phone call due to the COVID-19 pandemic. This patient has given me verbal consent via phone to conduct this visit, patient states they are participating from their home address. Some vital signs may be absent or patient reported.   Patient identification: identified by name, DOB, and current address.  Location provider: [facility], Office Persons participating in the virtual visit: Denman George LPN, patient, and Dr. Dimas Chyle   Subjective:   [name] is a 79 y.o. male who presents for Medicare Annual/Subsequent preventive examination.  Review of Systems:   Cardiac Risk Factors include: advanced age (>56men, >71 women);dyslipidemia;male gender;hypertension    Objective:    Vitals: There were no vitals taken for this visit.  There is no height or weight on file to calculate BMI.  Advanced Directives 07/13/2019 03/24/2018 03/26/2017 09/07/2016 02/20/2016 11/08/2015 10/28/2015  Does Patient Have a Medical Advance Directive? Yes No No Yes Yes Yes Yes  Type of Paramedic of [place];Living will - - [place];Living will [place];Living will Healthcare Power of [place]  Does patient want to make changes to medical advance directive? No - Patient declined - - - No - Patient declined No - Patient declined -  Copy of [place] in Chart? No - copy requested - - No - copy requested No - copy requested No - copy requested No - copy requested  Would patient like information on creating a medical advance directive? - No - Patient declined No - Patient declined - - - -  Pre-existing out of facility DNR order (yellow form or pink MOST form) - - - - - - -    Tobacco Social History   Tobacco Use  Smoking Status Never Smoker  Smokeless Tobacco Never Used     Counseling given: Not Answered   Clinical Intake:  Pre-visit preparation completed:  Yes  Pain : No/denies pain  Diabetes: No  How often do you need to have someone help you when you read instructions, pamphlets, or other written materials from your doctor or pharmacy?: 1 - Never  Interpreter Needed?: No  Information entered by :: Denman George LPN  Past Medical History:  Diagnosis Date  . Anxiety   . Arthritis of multiple joints   . Basal cell carcinoma of right scalp   . Benign prostatic hypertrophy   . CAD (coronary artery disease)   . Chronic anticoagulation 02/20/2016  . Chronic lower back pain, disk disease   . Depression   . Diverticulosis of colon   . Dysrhythmia   . Factor VII deficiency ([place])    10/28/15: No history of factor VII deficiency per Dr. Beryle Beams  . History of blood transfusion 1941  . History of colon polyps   . Hyperlipidemia   . ITP (idiopathic thrombocytopenic purpura)   . Kidney stones x 2, both passed   . Myocardial infarction ([place]) 2009  . Seasonal allergies   . Squamous carcinoma left scalp   . T-cell lymphoma ([place])    "I'm in stage I; original site was on my left chest; had a place around my waist treated last summer; got a place on back of my left leg" (10/03/2012)   Past Surgical History:  Procedure Laterality Date  . CARDIAC CATHETERIZATION  10/03/2012  . CATARACT EXTRACTION W/ INTRAOCULAR LENS  IMPLANT, BILATERAL Bilateral 2000's  . COLONOSCOPY    . CORONARY ANGIOPLASTY WITH STENT PLACEMENT  05/29/2007   "1" (10/03/2012)  . CYST EXCISION  1980's?   "or fatty tumor; taken off my back" (10/03/2012)  . KNEE SURGERY     right  . LEFT HEART CATHETERIZATION WITH CORONARY ANGIOGRAM N/A 10/03/2012   Procedure: LEFT HEART CATHETERIZATION WITH CORONARY ANGIOGRAM;  Surgeon: Burnell Blanks, MD;  Location: [facility] CATH LAB;  Service: Cardiovascular;  Laterality: N/A;  . LUMBAR EPIDURAL INJECTION  ~ 2004  . MOHS SURGERY Bilateral 2011-2012   "top of my head" (10/03/2012)  . PARTIAL KNEE ARTHROPLASTY Right 11/08/2015   Procedure:  RIGHT UNICOMPARTMENTAL KNEE;  Surgeon: Renette Butters, MD;  Location: [facility];  Service: Orthopedics;  Laterality: Right;  . TONSILLECTOMY  ~ 1945  . VASECTOMY     Family History  Problem Relation Age of Onset  . Alzheimer's disease Mother   . Lymphoma Father   . Coronary artery disease Brother   . COPD Brother   . Diabetes Brother   . Colon cancer Paternal Uncle   . Diabetes Brother    Social History   Socioeconomic History  . Marital status: Married    Spouse name: Not on file  . Number of children: 2  . Years of education: Not on file  . Highest education level: Not on file  Occupational History  . Occupation: Retired    Comment: Press photographer- IT sales professional   Tobacco Use  . Smoking status: Never Smoker  . Smokeless tobacco: Never Used  Substance and Sexual Activity  . Alcohol use: No    Alcohol/week: 0.0 standard drinks  . Drug use: No  . Sexual activity: Not Currently  Other Topics Concern  . Not on file  Social History Narrative   Wife is in a skilled facility- dementia    2 children- 1 locally and 1 in [place]: golfing    Social Determinants of Health   Financial Resource Strain:   . Difficulty of Paying Living Expenses: Not on file  Food Insecurity:   . Worried About Charity fundraiser in the Last Year: Not on file  . Ran Out of Food in the Last Year: Not on file  Transportation Needs:   . Lack of Transportation (Medical): Not on file  . Lack of Transportation (Non-Medical): Not on file  Physical Activity:   . Days of Exercise per Week: Not on file  . Minutes of Exercise per Session: Not on file  Stress:   . Feeling of Stress : Not on file  Social Connections:   . Frequency of Communication with Friends and Family: Not on file  . Frequency of Social Gatherings with Friends and Family: Not on file  . Attends Religious Services: Not on file  . Active Member of Clubs or Organizations: Not on file  . Attends Archivist Meetings: Not on file   . Marital Status: Not on file    Outpatient Encounter Medications as of 07/13/2019  Medication Sig  . acetaminophen (TYLENOL) 650 MG CR tablet Take 650 mg by mouth 2 (two) times daily.   Marland Kitchen ALPRAZolam (XANAX) 0.5 MG tablet Take 1 tablet (0.5 mg total) by mouth 2 (two) times daily as needed for anxiety (for sleep).  Marland Kitchen aspirin 81 MG tablet Take 81 mg by mouth daily.    Marland Kitchen atorvastatin (LIPITOR) 20 MG tablet Take 1 tablet (20 mg total) by mouth daily.  . carvedilol (COREG) 3.125 MG tablet Take 1 tablet (3.125 mg total) by mouth 2 (two) times daily  with a meal.  . fluticasone (FLONASE) 50 MCG/ACT nasal spray Place 1 spray into both nostrils 2 (two) times daily.  . fluticasone (FLOVENT HFA) 110 MCG/ACT inhaler INHALE 2 PUFFS BY MOUTH 2 TIMES DAILY AS NEED  . losartan (COZAAR) 50 MG tablet Take 1 tablet (50 mg total) by mouth daily.  . Magnesium 250 MG TABS Take 1 tablet by mouth daily.  . nitroGLYCERIN (NITROSTAT) 0.4 MG SL tablet Place 1 tablet (0.4 mg total) under the tongue every 5 (five) minutes as needed for chest pain (for chest pain).  . Omega-3 Fatty Acids (FISH OIL) 1000 MG CAPS Take 1,000 mg by mouth 2 (two) times daily.   . Saw Palmetto 450 MG CAPS Take 450 mg by mouth 2 (two) times daily.   Marland Kitchen VITAMIN D PO Take 1,000 mg by mouth daily.   No facility-administered encounter medications on file as of 07/13/2019.    Activities of Daily Living In your present state of health, do you have any difficulty performing the following activities: 07/13/2019  Hearing? N  Vision? N  Difficulty concentrating or making decisions? N  Walking or climbing stairs? N  Dressing or bathing? N  Doing errands, shopping? N  Preparing Food and eating ? N  Using the Toilet? N  In the past six months, have you accidently leaked urine? N  Do you have problems with loss of bowel control? N  Managing your Medications? N  Managing your Finances? N  Housekeeping or managing your Housekeeping? N  Some recent data  might be hidden    Patient Care Team: Vivi Barrack, MD as PCP - General (Family Medicine) Burnell Blanks, MD as PCP - Cardiology (Cardiology) Annia Belt, MD as Consulting Physician (Oncology) Burnell Blanks, MD as Consulting Physician (Cardiology) Wyatt Portela, MD as Consulting Physician (Oncology) Jari Pigg, MD as Consulting Physician (Dermatology)   Assessment:   This is a routine wellness examination for Jorgen.  Exercise Activities and Dietary recommendations Current Exercise Habits: The patient does not participate in regular exercise at present  Goals   None     Fall Risk Fall Risk  07/13/2019 05/28/2019 04/08/2019 04/03/2018 03/24/2018  Falls in the past year? 0 0 0 0 0  Comment - - Emmi Telephone Survey: data to providers prior to load Emmi Telephone Survey: data to providers prior to load -  Injury with Fall? 0 - - - -  Follow up Falls evaluation completed;Education provided;Falls prevention discussed - - - Falls evaluation completed   Is the patient's home free of loose throw rugs in walkways, pet beds, electrical cords, etc?   yes      Grab bars in the bathroom? yes      Handrails on the stairs?   yes      Adequate lighting?   yes  Depression Screen PHQ 2/9 Scores 07/13/2019 05/28/2019 02/10/2019 03/24/2018  PHQ - 2 Score 0 0 3 1  PHQ- 9 Score - - 8 -    Cognitive Function   6CIT Screen 07/13/2019  What Year? 0 points  What month? 0 points  What time? 0 points  Count back from 20 0 points  Months in reverse 0 points  Repeat phrase 0 points  Total Score 0    Immunization History  Administered Date(s) Administered  . Influenza Split 03/14/2011  . Influenza, High Dose Seasonal PF 01/31/2018  . Influenza,inj,Quad PF,6+ Mos 02/14/2017, 02/10/2019  . Influenza-Unspecified 01/22/2014  . Pneumococcal Conjugate-13 06/13/2005, 08/10/2013  .  Tdap 06/13/2009    Qualifies for Shingles Vaccine? Does not qualify per recommendations  from oncologist  Screening Tests Health Maintenance  Topic Date Due  . TETANUS/TDAP  06/14/2019  . PNA vac Low Risk Adult (2 of 2 - PPSV23) 08/18/2026 (Originally 08/11/2014)  . COLONOSCOPY  09/07/2021  . INFLUENZA VACCINE  Completed   Cancer Screenings: Lung: Low Dose CT Chest recommended if Age 76-80 years, 30 pack-year currently smoking OR have quit w/in 15years. Patient does not qualify. Colorectal: colonoscopy 09/07/16      Plan:  I have personally reviewed and addressed the Medicare Annual Wellness questionnaire and have noted the following in the patient's chart:  A. Medical and social history B. Use of alcohol, tobacco or illicit drugs  C. Current medications and supplements D. Functional ability and status E.  Nutritional status F.  Physical activity G. Advance directives H. List of other physicians I.  Hospitalizations, surgeries, and ER visits in previous 12 months J.  [place] such as hearing and vision if needed, cognitive and depression L. Referrals, records requested, and appointments- none   In addition, I have reviewed and discussed with patient certain preventive protocols, quality metrics, and best practice recommendations. A written personalized care plan for preventive services as well as general preventive health recommendations were provided to patient.   Signed,  Denman George, LPN  Nurse Health Advisor   Nurse Notes: no additional

## 2019-07-15 DIAGNOSIS — H52203 Unspecified astigmatism, bilateral: Secondary | ICD-10-CM | POA: Diagnosis not present

## 2019-07-15 DIAGNOSIS — H35373 Puckering of macula, bilateral: Secondary | ICD-10-CM | POA: Diagnosis not present

## 2019-07-15 DIAGNOSIS — H40013 Open angle with borderline findings, low risk, bilateral: Secondary | ICD-10-CM | POA: Diagnosis not present

## 2019-07-15 DIAGNOSIS — H524 Presbyopia: Secondary | ICD-10-CM | POA: Diagnosis not present

## 2019-10-06 IMAGING — DX DG CHEST 2V
2 series · 2 of 2 positions shown · non-contrast
Comparison: Radiograph March 23, 2014.

CLINICAL DATA: Chronic cough.

EXAM:
CHEST - 2 VIEW

[chest pa]
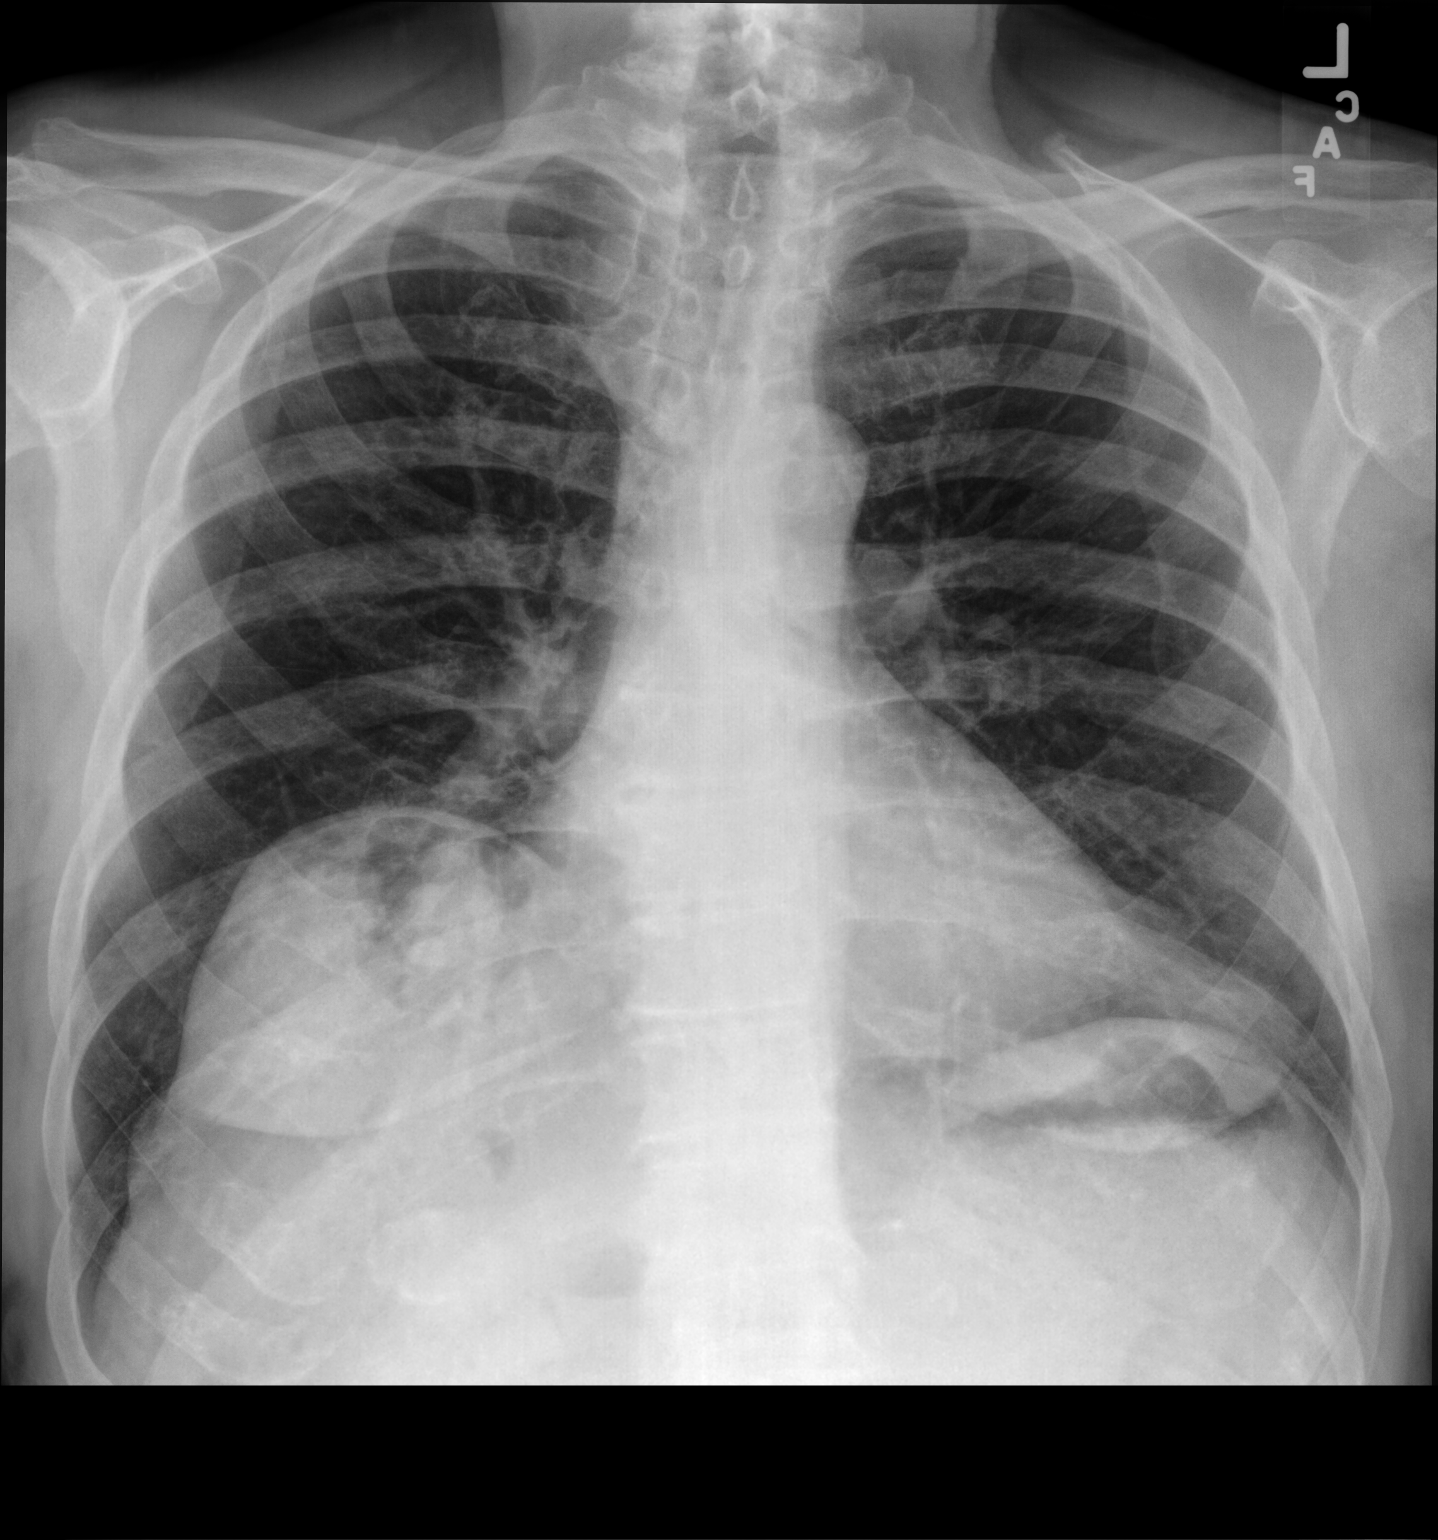

[chest lat]
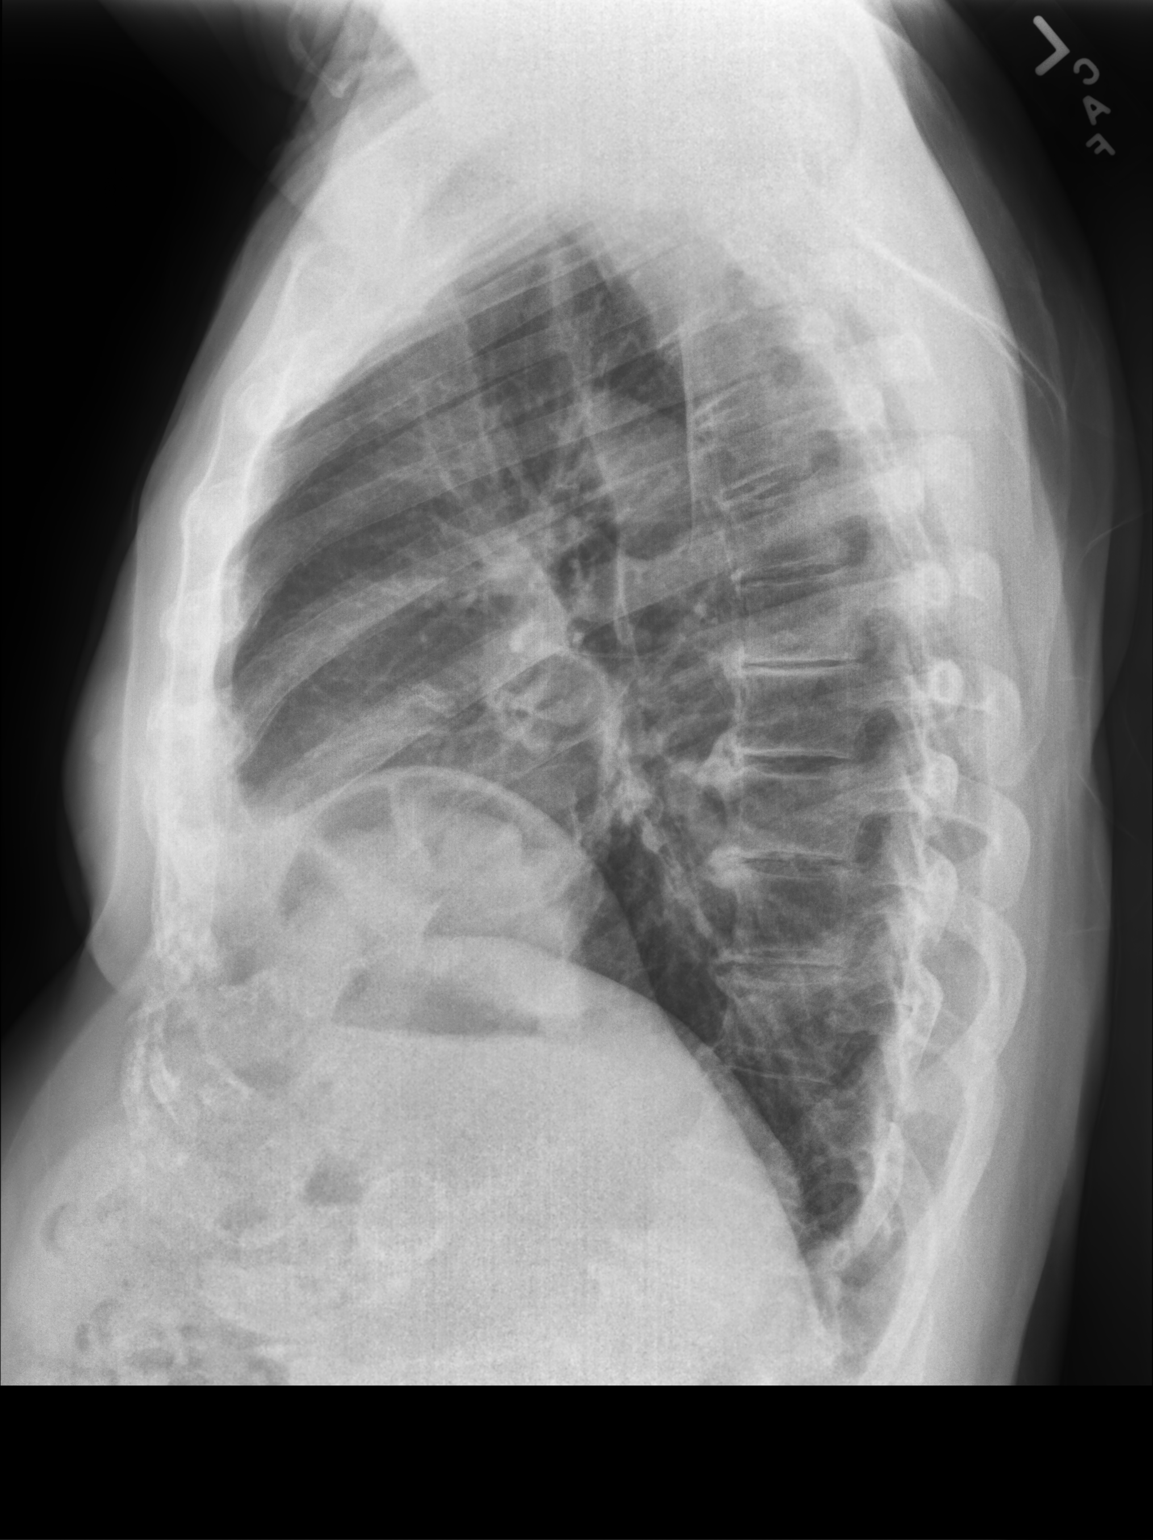

[2 of 2 positions shown; findings below may reference images not displayed]

FINDINGS: The heart size and mediastinal contours are within normal limits.
Both lungs are clear. No pneumothorax or pleural effusion is noted.
Stable elevated right hemidiaphragm is noted. The visualized
skeletal structures are unremarkable.
IMPRESSION: No active cardiopulmonary disease.

## 2019-12-04 ENCOUNTER — Other Ambulatory Visit: Payer: Self-pay

## 2019-12-04 ENCOUNTER — Inpatient Hospital Stay: Payer: Medicare Other

## 2019-12-04 ENCOUNTER — Inpatient Hospital Stay: Payer: Medicare Other | Attending: Oncology | Admitting: Oncology

## 2019-12-04 ENCOUNTER — Telehealth: Payer: Self-pay | Admitting: Oncology

## 2019-12-04 VITALS — BP 159/71 | HR 60 | Temp 97.7°F | Resp 18 | Ht 65.0 in | Wt 149.0 lb

## 2019-12-04 DIAGNOSIS — Z8572 Personal history of non-Hodgkin lymphomas: Secondary | ICD-10-CM | POA: Diagnosis not present

## 2019-12-04 DIAGNOSIS — Z79899 Other long term (current) drug therapy: Secondary | ICD-10-CM | POA: Diagnosis not present

## 2019-12-04 DIAGNOSIS — I2510 Atherosclerotic heart disease of native coronary artery without angina pectoris: Secondary | ICD-10-CM | POA: Diagnosis not present

## 2019-12-04 DIAGNOSIS — D693 Immune thrombocytopenic purpura: Secondary | ICD-10-CM

## 2019-12-04 DIAGNOSIS — Z7982 Long term (current) use of aspirin: Secondary | ICD-10-CM | POA: Diagnosis not present

## 2019-12-04 LAB — CMP (CANCER CENTER ONLY)
ALT: 26 U/L (ref 0–44)
AST: 24 U/L (ref 15–41)
Albumin: 3.6 g/dL (ref 3.5–5.0)
Alkaline Phosphatase: 70 U/L (ref 38–126)
Anion gap: 10 (ref 5–15)
BUN: 12 mg/dL (ref 8–23)
CO2: 27 mmol/L (ref 22–32)
Calcium: 9.3 mg/dL (ref 8.9–10.3)
Chloride: 105 mmol/L (ref 98–111)
Creatinine: 0.97 mg/dL (ref 0.61–1.24)
GFR, Est AFR Am: >60 mL/min (ref >60)
GFR, Estimated: >60 mL/min (ref >60)
Glucose, Bld: 111 mg/dL — ABNORMAL HIGH (ref 70–99)
Potassium: 4.8 mmol/L (ref 3.5–5.1)
Sodium: 142 mmol/L (ref 135–145)
Total Bilirubin: 1.5 mg/dL — ABNORMAL HIGH (ref 0.3–1.2)
Total Protein: 6 g/dL — ABNORMAL LOW (ref 6.5–8.1)

## 2019-12-04 LAB — CBC WITH DIFFERENTIAL (CANCER CENTER ONLY)
Abs Immature Granulocytes: 0.01 10*3/uL (ref 0.00–0.07)
Basophils Absolute: 0 10*3/uL (ref 0.0–0.1)
Basophils Relative: 1 %
Eosinophils Absolute: 0.1 10*3/uL (ref 0.0–0.5)
Eosinophils Relative: 2 %
HCT: 45.8 % (ref 39.0–52.0)
Hemoglobin: 15.2 g/dL (ref 13.0–17.0)
Immature Granulocytes: 0 %
Lymphocytes Relative: 31 %
Lymphs Abs: 1.3 10*3/uL (ref 0.7–4.0)
MCH: 33.6 pg (ref 26.0–34.0)
MCHC: 33.2 g/dL (ref 30.0–36.0)
MCV: 101.3 fL — ABNORMAL HIGH (ref 80.0–100.0)
Monocytes Absolute: 0.3 10*3/uL (ref 0.1–1.0)
Monocytes Relative: 8 %
Neutro Abs: 2.5 10*3/uL (ref 1.7–7.7)
Neutrophils Relative %: 58 %
Platelet Count: 105 10*3/uL — ABNORMAL LOW (ref 150–400)
RBC: 4.52 MIL/uL (ref 4.22–5.81)
RDW: 12.4 % (ref 11.5–15.5)
WBC Count: 4.3 10*3/uL (ref 4.0–10.5)
nRBC: 0 % (ref 0.0–0.2)

## 2019-12-04 NOTE — Telephone Encounter (Signed)
Scheduled appointments per 7/23 los. Patient is aware of appointments date and time.

## 2019-12-04 NOTE — Progress Notes (Signed)
Hematology and Oncology Follow Up Visit  [name] [id number] Sep 20, 1940 79 y.o. 12/04/2019 1:14 PM [name], MDParker, Johnathan Greenhouse, MD   Principle Diagnosis: 79 year old man with:  1.  ITP diagnosed in 2009.  He has not required treatment since that time.  2.  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  He was treated with topical therapies.   Prior Therapy:  Topical therapy for his T-cell cutaneous lymphoma.    He did not require any treatment for his thrombocytopenia.    Current therapy: Active surveillance.  Interim History: [name] is here for a follow-up visit.  Since the last visit, he reports no major changes in his health.  He continues to be active and attends to activities of daily living.  He does report some fatigue and tiredness as he is the caregiver for his wife who is currently in a skilled nursing facility.  He denies any new skin rashes or lesions.  He denies any bleeding issues.  Medications: Unchanged on review. Current Outpatient Medications  Medication Sig Dispense Refill  . acetaminophen (TYLENOL) 650 MG CR tablet Take 650 mg by mouth 2 (two) times daily.     Marland Kitchen aspirin 81 MG tablet Take 81 mg by mouth daily.      Marland Kitchen atorvastatin (LIPITOR) 20 MG tablet Take 1 tablet (20 mg total) by mouth daily. 90 tablet 3  . carvedilol (COREG) 3.125 MG tablet Take 1 tablet (3.125 mg total) by mouth 2 (two) times daily with a meal. 180 tablet 3  . fluticasone (FLONASE) 50 MCG/ACT nasal spray Place 1 spray into both nostrils 2 (two) times daily. 1 g 0  . fluticasone (FLOVENT HFA) 110 MCG/ACT inhaler INHALE 2 PUFFS BY MOUTH 2 TIMES DAILY AS NEED 36 Inhaler 1  . losartan (COZAAR) 50 MG tablet Take 1 tablet (50 mg total) by mouth daily. 90 tablet 3  . Magnesium 250 MG TABS Take 1 tablet by mouth daily.    . nitroGLYCERIN (NITROSTAT) 0.4 MG SL tablet Place 1 tablet (0.4 mg total) under the tongue every 5 (five) minutes as needed for chest  pain (for chest pain). 25 tablet 6  . Omega-3 Fatty Acids (FISH OIL) 1000 MG CAPS Take 1,000 mg by mouth 2 (two) times daily.     . Saw Palmetto 450 MG CAPS Take 450 mg by mouth 2 (two) times daily.     Marland Kitchen VITAMIN D PO Take 1,000 mg by mouth daily.     No current facility-administered medications for this visit.     Allergies:  Allergies  Allergen Reactions  . Other Other (See Comments)    ANTI-DEPRESSANTS. ANTI-DEPRESSANTS Antidepressants...has a variety of reactions to different antidepressants  . Prozac [Fluoxetine Hcl] Other (See Comments)    nightmares  . Oxycodone Other (See Comments)    unknown        Physical Exam:  ECOG: 1    General appearance: Alert, awake without any distress. Head: Atraumatic without abnormalities Oropharynx: Without any thrush or ulcers. Eyes: No scleral icterus. Lymph nodes: No lymphadenopathy noted in the cervical, supraclavicular, or axillary nodes Heart:regular rate and rhythm, without any murmurs or gallops.   Lung: Clear to auscultation without any rhonchi, wheezes or dullness to percussion. Abdomin: Soft, nontender without any shifting dullness or ascites. Musculoskeletal: No clubbing or cyanosis. Neurological: No motor or sensory deficits. Skin: No rashes or lesions.    Lab Results: Lab Results  Component Value Date   WBC 5.0  06/05/2019   HGB 15.7 06/05/2019   HCT 48.3 06/05/2019   MCV 100.0 06/05/2019   PLT 112 (L) 06/05/2019     Chemistry      Component Value Date/Time   NA 140 06/05/2019 1118   NA 140 03/18/2018 1126   NA 142 03/23/2013 1424   K 4.8 06/05/2019 1118   K 4.7 03/23/2013 1424   CL 105 06/05/2019 1118   CL 106 09/22/2012 1428   CO2 28 06/05/2019 1118   CO2 28 03/23/2013 1424   BUN 15 06/05/2019 1118   BUN 15 03/18/2018 1126   BUN 18.1 03/23/2013 1424   CREATININE 0.86 06/05/2019 1118   CREATININE 0.85 10/25/2014 1040   CREATININE 0.8 03/23/2013 1424      Component Value Date/Time   CALCIUM  9.0 06/05/2019 1118   CALCIUM 9.4 03/23/2013 1424   ALKPHOS 72 06/05/2019 1118   ALKPHOS 62 03/23/2013 1424   AST 22 06/05/2019 1118   AST 25 03/23/2013 1424   ALT 24 06/05/2019 1118   ALT 27 03/23/2013 1424   BILITOT 1.1 06/05/2019 1118   BILITOT 0.86 03/23/2013 1424        Impression and Plan:  79 year old man with:  1.    ITP diagnosed in 2009.  He presented with mild thrombocytopenia and has not required treatment since that time.  Laboratory data in the last 2 years showed a stable platelet count that has fluctuated over 100,000 is closer to 120,000.  Laboratory data from today reviewed and showed a platelet count of 105,000 which is consistent with his baseline.  Natural course of this disease was reviewed and treatment options were discussed he develops worsening disease in the future.  Trial of steroids versus IVIG among others were discussed.  2.    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   He has follow-up with dermatology regularly.  No recent evidence of relapse.    3.  Follow-up: In 6 months for repeat evaluation.  20 minutes were dedicated to this visit.  Time was spent on reviewing laboratory data, disease status update and future plan of care review.Zola Button, MD 7/23/20211:14 PM

## 2019-12-29 DIAGNOSIS — L729 Follicular cyst of the skin and subcutaneous tissue, unspecified: Secondary | ICD-10-CM | POA: Diagnosis not present

## 2019-12-29 DIAGNOSIS — L570 Actinic keratosis: Secondary | ICD-10-CM | POA: Diagnosis not present

## 2019-12-29 DIAGNOSIS — D044 Carcinoma in situ of skin of scalp and neck: Secondary | ICD-10-CM | POA: Diagnosis not present

## 2020-02-03 ENCOUNTER — Telehealth: Payer: Self-pay | Admitting: Family Medicine

## 2020-02-03 NOTE — Chronic Care Management (AMB) (Signed)
  Chronic Care Management   Note  02/03/2020 Name: [name] MRN: [id number] DOB: [birth year]  [name] is a 79 y.o. year old male who is a primary care patient of Vivi Barrack, MD. I reached out to [name] by phone today in response to a referral sent by [name]'s PCP, Vivi Barrack, MD.   [name] was given information about Chronic Care Management services today including:  1. CCM service includes personalized support from designated clinical staff supervised by his physician, including individualized plan of care and coordination with other care providers 2. 24/7 contact phone numbers for assistance for urgent and routine care needs. 3. Service will only be billed when office clinical staff spend 20 minutes or more in a month to coordinate care. 4. Only one practitioner may furnish and bill the service in a calendar month. 5. The patient may stop CCM services at any time (effective at the end of the month) by phone call to the office staff.   Patient did not agree to enrollment in care management services and does not wish to consider at this time.  Follow up plan:  [place]

## 2020-03-07 ENCOUNTER — Other Ambulatory Visit: Payer: Self-pay

## 2020-03-07 ENCOUNTER — Encounter: Payer: Self-pay | Admitting: Cardiovascular Disease

## 2020-03-07 ENCOUNTER — Ambulatory Visit (INDEPENDENT_AMBULATORY_CARE_PROVIDER_SITE_OTHER): Payer: Medicare Other | Admitting: Cardiovascular Disease

## 2020-03-07 VITALS — BP 118/68 | HR 59 | Ht 65.0 in | Wt 148.8 lb

## 2020-03-07 DIAGNOSIS — E7800 Pure hypercholesterolemia, unspecified: Secondary | ICD-10-CM | POA: Diagnosis not present

## 2020-03-07 DIAGNOSIS — I10 Essential (primary) hypertension: Secondary | ICD-10-CM

## 2020-03-07 DIAGNOSIS — Z23 Encounter for immunization: Secondary | ICD-10-CM | POA: Diagnosis not present

## 2020-03-07 DIAGNOSIS — I2510 Atherosclerotic heart disease of native coronary artery without angina pectoris: Secondary | ICD-10-CM

## 2020-03-07 MED ORDER — CARVEDILOL 3.125 MG PO TABS: 3.1250 mg | ORAL_TABLET | Freq: Two times a day (BID) | ORAL | 3 refills | Status: DC

## 2020-03-07 MED ORDER — ATORVASTATIN CALCIUM 20 MG PO TABS: 20.0000 mg | ORAL_TABLET | Freq: Every day | ORAL | 3 refills | Status: DC

## 2020-03-07 MED ORDER — LOSARTAN POTASSIUM 50 MG PO TABS: 50.0000 mg | ORAL_TABLET | Freq: Every day | ORAL | 3 refills | Status: DC

## 2020-03-07 MED ORDER — NITROGLYCERIN 0.4 MG SL SUBL: 0.4000 mg | SUBLINGUAL_TABLET | SUBLINGUAL | 6 refills | Status: DC | PRN

## 2020-03-07 NOTE — Patient Instructions (Signed)
Medication Instructions:  The current medical regimen is effective;  continue present plan and medications.  *If you need a refill on your cardiac medications before your next appointment, please call your pharmacy*  Follow-Up: At [facility], you and your health needs are our priority.  As part of our continuing mission to provide you with exceptional heart care, we have created designated Provider Care Teams.  These Care Teams include your primary Cardiologist (physician) and Advanced Practice Providers (APPs -  Physician Assistants and Nurse Practitioners) who all work together to provide you with the care you need, when you need it.  We recommend signing up for the patient portal called "MyChart".  Sign up information is provided on this After Visit Summary.  MyChart is used to connect with patients for Virtual Visits (Telemedicine).  Patients are able to view lab/test results, encounter notes, upcoming appointments, etc.  Non-urgent messages can be sent to your provider as well.   To learn more about what you can do with MyChart, go to [email or web address].    Your next appointment:   12 month(s)  The format for your next appointment:   In Person  Provider:   Lauree Chandler, MD  Thank you for choosing [facility]!!

## 2020-03-07 NOTE — Progress Notes (Signed)
Chief Complaint  Patient presents with  . Follow-up    CAD   History of Present Illness: 79 yo male with history of CAD, HTN, hyperlipidemia, idiopathic thrombocytopenia and OSA here today for cardiac follow up. He had an anterior STEMI in January 2009 and had a drug eluting stent placed in the LAD. His ejection fraction improved from 25% to 40-50% following the first event. No evidence of AAA on abdominal aortic u/s December 2013. He has since been diagnosed with T-cell lymphoma but this is in remission. Nuclear stress test 2014 with scar in the anterior wall, apex and inferoapical wall with LVEF of 55%. No ischemic EKG changes. Cardiac cath May 2014 with mild to moderate CAD, no obstructive lesions. He has chronic left arm weakness and aching. No change with exertion. Echo November 2019 with LVEF=45-50%. Trivial AI, MR, TR.   He is here today for follow up. The patient denies any chest pain, dyspnea, palpitations, lower extremity edema, orthopnea, PND, dizziness, near syncope or syncope. Chronic cough.   Primary Care Physician: Vivi Barrack, MD  Past Medical History:  Diagnosis Date  . Anxiety   . Arthritis of multiple joints   . Basal cell carcinoma of right scalp   . Benign prostatic hypertrophy   . CAD (coronary artery disease)   . Chronic anticoagulation 02/20/2016  . Chronic lower back pain, disk disease   . Depression   . Diverticulosis of colon   . Dysrhythmia   . Factor VII deficiency ([place])    10/28/15: No history of factor VII deficiency per Dr. Beryle Beams  . History of blood transfusion 1941  . History of colon polyps   . Hyperlipidemia   . ITP (idiopathic thrombocytopenic purpura)   . Kidney stones x 2, both passed   . Myocardial infarction ([place]) 2009  . Seasonal allergies   . Squamous carcinoma left scalp   . T-cell lymphoma ([place])    "I'm in stage I; original site was on my left chest; had a place around my waist treated last summer; got a place on back of my  left leg" (10/03/2012)    Past Surgical History:  Procedure Laterality Date  . CARDIAC CATHETERIZATION  10/03/2012  . CATARACT EXTRACTION W/ INTRAOCULAR LENS  IMPLANT, BILATERAL Bilateral 2000's  . COLONOSCOPY    . CORONARY ANGIOPLASTY WITH STENT PLACEMENT  05/29/2007   "1" (10/03/2012)  . CYST EXCISION  1980's?   "or fatty tumor; taken off my back" (10/03/2012)  . KNEE SURGERY     right  . LEFT HEART CATHETERIZATION WITH CORONARY ANGIOGRAM N/A 10/03/2012   Procedure: LEFT HEART CATHETERIZATION WITH CORONARY ANGIOGRAM;  Surgeon: Burnell Blanks, MD;  Location: [facility] CATH LAB;  Service: Cardiovascular;  Laterality: N/A;  . LUMBAR EPIDURAL INJECTION  ~ 2004  . MOHS SURGERY Bilateral 2011-2012   "top of my head" (10/03/2012)  . PARTIAL KNEE ARTHROPLASTY Right 11/08/2015   Procedure: RIGHT UNICOMPARTMENTAL KNEE;  Surgeon: Renette Butters, MD;  Location: [facility];  Service: Orthopedics;  Laterality: Right;  . TONSILLECTOMY  ~ 1945  . VASECTOMY      Current Outpatient Medications  Medication Sig Dispense Refill  . acetaminophen (TYLENOL) 650 MG CR tablet Take 650 mg by mouth 2 (two) times daily.     Marland Kitchen ALPRAZolam (XANAX) 0.5 MG tablet Take 0.5 mg by mouth 2 (two) times daily as needed.    Marland Kitchen aspirin 81 MG tablet Take 81 mg by mouth daily.      Marland Kitchen  atorvastatin (LIPITOR) 20 MG tablet Take 1 tablet (20 mg total) by mouth daily. 90 tablet 3  . carvedilol (COREG) 3.125 MG tablet Take 1 tablet (3.125 mg total) by mouth 2 (two) times daily with a meal. 180 tablet 3  . fluticasone (FLOVENT HFA) 110 MCG/ACT inhaler INHALE 2 PUFFS BY MOUTH 2 TIMES DAILY AS NEED 36 Inhaler 1  . losartan (COZAAR) 50 MG tablet Take 1 tablet (50 mg total) by mouth daily. 90 tablet 3  . Magnesium 250 MG TABS Take 1 tablet by mouth daily.    . nitroGLYCERIN (NITROSTAT) 0.4 MG SL tablet Place 1 tablet (0.4 mg total) under the tongue every 5 (five) minutes as needed for chest pain (for chest pain). 25 tablet 6  . Omega-3 Fatty  Acids (FISH OIL) 1000 MG CAPS Take 1,000 mg by mouth 2 (two) times daily.     . Saw Palmetto 450 MG CAPS Take 450 mg by mouth 2 (two) times daily.     Marland Kitchen VITAMIN D PO Take 1,000 mg by mouth daily.    . fluticasone (FLONASE) 50 MCG/ACT nasal spray Place 1 spray into both nostrils 2 (two) times daily. 1 g 0   No current facility-administered medications for this visit.    Allergies  Allergen Reactions  . Other Other (See Comments)    ANTI-DEPRESSANTS. ANTI-DEPRESSANTS Antidepressants...has a variety of reactions to different antidepressants  . Prozac [Fluoxetine Hcl] Other (See Comments)    nightmares  . Oxycodone Other (See Comments)    unknown    Social History   Socioeconomic History  . Marital status: Married    Spouse name: Not on file  . Number of children: 2  . Years of education: Not on file  . Highest education level: Not on file  Occupational History  . Occupation: Retired    Comment: Press photographer- IT sales professional   Tobacco Use  . Smoking status: Never Smoker  . Smokeless tobacco: Never Used  Vaping Use  . Vaping Use: Never used  Substance and Sexual Activity  . Alcohol use: No    Alcohol/week: 0.0 standard drinks  . Drug use: No  . Sexual activity: Not Currently  Other Topics Concern  . Not on file  Social History Narrative   Wife is in a skilled facility- dementia    2 children- 1 locally and 1 in [place]: golfing    Social Determinants of Health   Financial Resource Strain:   . Difficulty of Paying Living Expenses: Not on file  Food Insecurity:   . Worried About Charity fundraiser in the Last Year: Not on file  . Ran Out of Food in the Last Year: Not on file  Transportation Needs:   . Lack of Transportation (Medical): Not on file  . Lack of Transportation (Non-Medical): Not on file  Physical Activity:   . Days of Exercise per Week: Not on file  . Minutes of Exercise per Session: Not on file  Stress:   . Feeling of Stress : Not on file  Social  Connections:   . Frequency of Communication with Friends and Family: Not on file  . Frequency of Social Gatherings with Friends and Family: Not on file  . Attends Religious Services: Not on file  . Active Member of Clubs or Organizations: Not on file  . Attends Archivist Meetings: Not on file  . Marital Status: Not on file  Intimate Partner Violence:   . Fear of Current or  Ex-Partner: Not on file  . Emotionally Abused: Not on file  . Physically Abused: Not on file  . Sexually Abused: Not on file    Family History  Problem Relation Age of Onset  . Alzheimer's disease Mother   . Lymphoma Father   . Coronary artery disease Brother   . COPD Brother   . Diabetes Brother   . Colon cancer Paternal Uncle   . Diabetes Brother     Review of Systems:  As stated in the HPI and otherwise negative.   BP 118/68   Pulse (!) 59   Ht 5\' 5"  (1.651 m)   Wt 148 lb 12.8 oz (67.5 kg)   SpO2 95%   BMI 24.76 kg/m   Physical Examination:  General: Well developed, well nourished, NAD  HEENT: OP clear, mucus membranes moist  SKIN: warm, dry. No rashes. Neuro: No focal deficits  Musculoskeletal: Muscle strength 5/5 all ext  Psychiatric: Mood and affect normal  Neck: No JVD, no carotid bruits, no thyromegaly, no lymphadenopathy.  Lungs:Clear bilaterally, no wheezes, rhonci, crackles Cardiovascular: Regular rate and rhythm. No murmurs, gallops or rubs. Abdomen:Soft. Bowel sounds present. Non-tender.  Extremities: No lower extremity edema. Pulses are 2 + in the bilateral DP/PT.  Cardiac cath may 2014: Left main: No obstructive disease.  Left Anterior Descending Artery: Large caliber vessel that courses to the apex. The proximal vessel has mild plaque disease. The mid stented segment is patent. The small caliber diagonal branch is patent with ostial 70% stenosis as it is jailed by the stent and unchanged in appearance.  Circumflex Artery: Moderate caliber vessel with moderate caliber  obtuse marginal branch with no significant disease. The AV groove Circumflex has a 40% stenosis just after the takeoff of the OM branch, unchanged.  Right Coronary Artery: Large, dominant vessel with serial 20% lesions throughout the proximal vessel. The mid vessel has a 40% stenosis. The distal vessel has mild plaque disease.  Left Ventricular Angiogram: LVEF 55% with anterior and apical hypokinesis.   EKG:  EKG is ordered today. The ekg ordered today demonstrates Sinus  Echo November 2019:  - Left ventricle: The cavity size was normal. Systolic function was   mildly reduced. The estimated ejection fraction was in the range   of 45% to 50%. There is akinesis of the apical myocardium. There   was an increased relative contribution of atrial contraction to   ventricular filling. Doppler parameters are consistent with   abnormal left ventricular relaxation (grade 1 diastolic   dysfunction). - Aortic valve: Trileaflet; normal thickness, mildly calcified   leaflets. There was trivial regurgitation. - Mitral valve: Calcified annulus. There was trivial regurgitation. - Tricuspid valve: There was trivial regurgitation. - Pulmonic valve: There was trivial regurgitation. - Pulmonary arteries: PA peak pressure: 34 mm Hg (S).  Recent Labs: 12/04/2019: ALT 26; BUN 12; Creatinine 0.97; Hemoglobin 15.2; Platelet Count 105; Potassium 4.8; Sodium 142   Lipid Panel Lipid Panel     Component Value Date/Time   CHOL 122 02/10/2019 1054   TRIG 64.0 02/10/2019 1054   HDL 62.20 02/10/2019 1054   CHOLHDL 2 02/10/2019 1054   VLDL 12.8 02/10/2019 1054   LDLCALC 47 02/10/2019 1054   LDLDIRECT 137.3 08/08/2006 1059      Wt Readings from Last 3 Encounters:  03/07/20 148 lb 12.8 oz (67.5 kg)  12/04/19 149 lb (67.6 kg)  06/05/19 146 lb 3.2 oz (66.3 kg)     Other studies Reviewed: Additional studies/ records that were  reviewed today include: . Review of the above records demonstrates:   Assessment  and Plan:   1. CAD without angina: He has no chest pain. Continue ASA, beta blocker and statin.    2. HTN: BP is well controlled. No changes  3. Hyperlipidemia: Lipids well controlled in 2020. Most recent LDL 47 in September 2020. Continue statin. He wishes to wait until his primary care visit in January to repeat the lipids and  LFTs  Current medicines are reviewed at length with the patient today.  The patient does not have concerns regarding medicines.  The following changes have been made:  no change  Labs/ tests ordered today include:   Orders Placed This Encounter  Procedures  . EKG 12-Lead   Disposition:   FU with me in 12 months  Signed, Lauree Chandler, MD 03/07/2020 4:41 PM    [facility] [address], [place], [place]  [address] Phone: [phone]; Fax: [phone]

## 2020-05-09 ENCOUNTER — Telehealth: Payer: Self-pay

## 2020-05-09 ENCOUNTER — Other Ambulatory Visit: Payer: Self-pay

## 2020-05-09 DIAGNOSIS — R5383 Other fatigue: Secondary | ICD-10-CM

## 2020-05-09 DIAGNOSIS — E7800 Pure hypercholesterolemia, unspecified: Secondary | ICD-10-CM

## 2020-05-09 DIAGNOSIS — I10 Essential (primary) hypertension: Secondary | ICD-10-CM

## 2020-05-09 DIAGNOSIS — D693 Immune thrombocytopenic purpura: Secondary | ICD-10-CM

## 2020-05-09 NOTE — Telephone Encounter (Signed)
Ok to place order for CBC, CMET, TSH, and lipid panel.  Katina Degree. [name], MD 05/09/2020 11:39 AM

## 2020-05-09 NOTE — Telephone Encounter (Signed)
See below

## 2020-05-09 NOTE — Telephone Encounter (Signed)
Patient is requesting lab work before his appt 1/17 can we put in order for this please, thank you

## 2020-05-09 NOTE — Telephone Encounter (Signed)
Patient is aware that lab orders are in.

## 2020-05-16 ENCOUNTER — Other Ambulatory Visit: Payer: Medicare Other

## 2020-05-16 DIAGNOSIS — Z20822 Contact with and (suspected) exposure to covid-19: Secondary | ICD-10-CM

## 2020-05-17 LAB — SARS-COV-2, NAA 2 DAY TAT

## 2020-05-17 LAB — NOVEL CORONAVIRUS, NAA: SARS-CoV-2, NAA: NOT DETECTED

## 2020-05-19 ENCOUNTER — Other Ambulatory Visit (INDEPENDENT_AMBULATORY_CARE_PROVIDER_SITE_OTHER): Payer: Medicare Other

## 2020-05-19 DIAGNOSIS — R5383 Other fatigue: Secondary | ICD-10-CM

## 2020-05-19 DIAGNOSIS — E7800 Pure hypercholesterolemia, unspecified: Secondary | ICD-10-CM | POA: Diagnosis not present

## 2020-05-19 DIAGNOSIS — Z0000 Encounter for general adult medical examination without abnormal findings: Secondary | ICD-10-CM | POA: Diagnosis not present

## 2020-05-19 DIAGNOSIS — R5381 Other malaise: Secondary | ICD-10-CM | POA: Diagnosis not present

## 2020-05-19 DIAGNOSIS — D693 Immune thrombocytopenic purpura: Secondary | ICD-10-CM

## 2020-05-19 DIAGNOSIS — I10 Essential (primary) hypertension: Secondary | ICD-10-CM | POA: Diagnosis not present

## 2020-05-19 LAB — PSA: PSA: 1.45 ng/mL (ref 0.10–4.00)

## 2020-05-19 LAB — CBC
HCT: 44.5 % (ref 39.0–52.0)
Hemoglobin: 15.1 g/dL (ref 13.0–17.0)
MCHC: 33.8 g/dL (ref 30.0–36.0)
MCV: 99.3 fl (ref 78.0–100.0)
Platelets: 115 10*3/uL — ABNORMAL LOW (ref 150.0–400.0)
RBC: 4.48 Mil/uL (ref 4.22–5.81)
RDW: 13.1 % (ref 11.5–15.5)
WBC: 4.4 10*3/uL (ref 4.0–10.5)

## 2020-05-19 LAB — COMPREHENSIVE METABOLIC PANEL
ALT: 25 U/L (ref 0–53)
AST: 23 U/L (ref 0–37)
Albumin: 4.1 g/dL (ref 3.5–5.2)
Alkaline Phosphatase: 70 U/L (ref 39–117)
BUN: 15 mg/dL (ref 6–23)
CO2: 26 mEq/L (ref 19–32)
Calcium: 8.8 mg/dL (ref 8.4–10.5)
Chloride: 107 mEq/L (ref 96–112)
Creatinine, Ser: 0.82 mg/dL (ref 0.40–1.50)
GFR: 82.33 mL/min (ref >60.00)
Glucose, Bld: 102 mg/dL — ABNORMAL HIGH (ref 70–99)
Potassium: 3.8 mEq/L (ref 3.5–5.1)
Sodium: 139 mEq/L (ref 135–145)
Total Bilirubin: 1 mg/dL (ref 0.2–1.2)
Total Protein: 6.1 g/dL (ref 6.0–8.3)

## 2020-05-19 LAB — LIPID PANEL
Cholesterol: 124 mg/dL (ref 0–200)
HDL: 65.7 mg/dL (ref >39.00)
LDL Cholesterol: 47 mg/dL (ref 0–99)
NonHDL: 58.3
Total CHOL/HDL Ratio: 2
Triglycerides: 58 mg/dL (ref 0.0–149.0)
VLDL: 11.6 mg/dL (ref 0.0–40.0)

## 2020-05-19 LAB — TSH: TSH: 2.19 u[IU]/mL (ref 0.35–4.50)

## 2020-05-19 NOTE — Progress Notes (Unsigned)
PSA order

## 2020-05-23 NOTE — Progress Notes (Signed)
Please inform patient of the following:  Labs are all stable. We can discuss more at his upcoming lab visit.   [name]. Jerline Pain, MD 05/23/2020 10:33 AM

## 2020-05-30 ENCOUNTER — Encounter: Payer: Medicare Other | Admitting: Family Medicine

## 2020-06-07 ENCOUNTER — Inpatient Hospital Stay (HOSPITAL_BASED_OUTPATIENT_CLINIC_OR_DEPARTMENT_OTHER): Payer: Medicare Other | Admitting: Oncology

## 2020-06-07 ENCOUNTER — Other Ambulatory Visit: Payer: Self-pay

## 2020-06-07 ENCOUNTER — Inpatient Hospital Stay: Payer: Medicare Other | Attending: Oncology

## 2020-06-07 VITALS — BP 151/70 | HR 68 | Temp 97.8°F | Resp 18 | Ht 65.0 in | Wt 152.6 lb

## 2020-06-07 DIAGNOSIS — C8400 Mycosis fungoides, unspecified site: Secondary | ICD-10-CM | POA: Diagnosis not present

## 2020-06-07 DIAGNOSIS — Z8572 Personal history of non-Hodgkin lymphomas: Secondary | ICD-10-CM | POA: Diagnosis not present

## 2020-06-07 DIAGNOSIS — D693 Immune thrombocytopenic purpura: Secondary | ICD-10-CM | POA: Insufficient documentation

## 2020-06-07 DIAGNOSIS — Z79899 Other long term (current) drug therapy: Secondary | ICD-10-CM | POA: Insufficient documentation

## 2020-06-07 DIAGNOSIS — Z7982 Long term (current) use of aspirin: Secondary | ICD-10-CM | POA: Diagnosis not present

## 2020-06-07 LAB — CBC WITH DIFFERENTIAL (CANCER CENTER ONLY)
Abs Immature Granulocytes: 0.01 10*3/uL (ref 0.00–0.07)
Basophils Absolute: 0 10*3/uL (ref 0.0–0.1)
Basophils Relative: 0 %
Eosinophils Absolute: 0.2 10*3/uL (ref 0.0–0.5)
Eosinophils Relative: 3 %
HCT: 44.3 % (ref 39.0–52.0)
Hemoglobin: 14.5 g/dL (ref 13.0–17.0)
Immature Granulocytes: 0 %
Lymphocytes Relative: 27 %
Lymphs Abs: 1.4 10*3/uL (ref 0.7–4.0)
MCH: 33.3 pg (ref 26.0–34.0)
MCHC: 32.7 g/dL (ref 30.0–36.0)
MCV: 101.8 fL — ABNORMAL HIGH (ref 80.0–100.0)
Monocytes Absolute: 0.5 10*3/uL (ref 0.1–1.0)
Monocytes Relative: 10 %
Neutro Abs: 3.2 10*3/uL (ref 1.7–7.7)
Neutrophils Relative %: 60 %
Platelet Count: 122 10*3/uL — ABNORMAL LOW (ref 150–400)
RBC: 4.35 MIL/uL (ref 4.22–5.81)
RDW: 12.4 % (ref 11.5–15.5)
WBC Count: 5.3 10*3/uL (ref 4.0–10.5)
nRBC: 0 % (ref 0.0–0.2)

## 2020-06-07 NOTE — Progress Notes (Signed)
Hematology and Oncology Follow Up Visit  [name] [id number] December 08, 1940 80 y.o. 06/07/2020 2:56 PM [name], MDParker, [name], MD   Principle Diagnosis: 80 year old man with  ITP presented with mild thrombocytopenia and did not require treatment in 2009.     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  He was treated with topical therapies.   Prior Therapy:  Topical therapy for his T-cell cutaneous lymphoma.      Current therapy: Active surveillance.  Interim History: [name] presents today for repeat evaluation.  Since the last visit, he reports no major changes in his health.  Hemodynamics hospitalization or illnesses.  He denies any skin rash or lesions.  He denies any epistaxis or bleeding.  Performance status activity level remain excellent.  Eating well and has gained few pounds.  Medications: Updated on review. Current Outpatient Medications  Medication Sig Dispense Refill  . acetaminophen (TYLENOL) 650 MG CR tablet Take 650 mg by mouth 2 (two) times daily.     Marland Kitchen ALPRAZolam (XANAX) 0.5 MG tablet Take 0.5 mg by mouth 2 (two) times daily as needed.    Marland Kitchen aspirin 81 MG tablet Take 81 mg by mouth daily.      Marland Kitchen atorvastatin (LIPITOR) 20 MG tablet Take 1 tablet (20 mg total) by mouth daily. 90 tablet 3  . carvedilol (COREG) 3.125 MG tablet Take 1 tablet (3.125 mg total) by mouth 2 (two) times daily with a meal. 180 tablet 3  . fluticasone (FLONASE) 50 MCG/ACT nasal spray Place 1 spray into both nostrils 2 (two) times daily. 1 g 0  . fluticasone (FLOVENT HFA) 110 MCG/ACT inhaler INHALE 2 PUFFS BY MOUTH 2 TIMES DAILY AS NEED 36 Inhaler 1  . losartan (COZAAR) 50 MG tablet Take 1 tablet (50 mg total) by mouth daily. 90 tablet 3  . Magnesium 250 MG TABS Take 1 tablet by mouth daily.    . nitroGLYCERIN (NITROSTAT) 0.4 MG SL tablet Place 1 tablet (0.4 mg total) under the tongue every 5 (five) minutes as needed for chest pain  (for chest pain). 25 tablet 6  . Omega-3 Fatty Acids (FISH OIL) 1000 MG CAPS Take 1,000 mg by mouth 2 (two) times daily.     . Saw Palmetto 450 MG CAPS Take 450 mg by mouth 2 (two) times daily.     Marland Kitchen VITAMIN D PO Take 1,000 mg by mouth daily.     No current facility-administered medications for this visit.     Allergies:  Allergies  Allergen Reactions  . Other Other (See Comments)    ANTI-DEPRESSANTS. ANTI-DEPRESSANTS Antidepressants...has a variety of reactions to different antidepressants  . Prozac [Fluoxetine Hcl] Other (See Comments)    nightmares  . Oxycodone Other (See Comments)    unknown        Physical Exam: Blood pressure (!) 151/70, pulse 68, temperature 97.8 F (36.6 C), temperature source Tympanic, resp. rate 18, height 5\' 5"  (1.651 m), weight 152 lb 9.6 oz (69.2 kg), SpO2 99 %.   ECOG: 1      General appearance: Comfortable appearing without any discomfort Head: Normocephalic without any trauma Oropharynx: Mucous membranes are moist and pink without any thrush or ulcers. Eyes: Pupils are equal and round reactive to light. Lymph nodes: No cervical, supraclavicular, inguinal or axillary lymphadenopathy.   Heart:regular rate and rhythm.  S1 and S2 without leg edema. Lung: Clear without any rhonchi or wheezes.  No dullness to percussion. Abdomin: Soft,  nontender, nondistended with good bowel sounds.  No hepatosplenomegaly. Musculoskeletal: No joint deformity or effusion.  Full range of motion noted. Neurological: No deficits noted on motor, sensory and deep tendon reflex exam. Skin: No petechial rash or dryness.  Appeared moist.       Lab Results: Lab Results  Component Value Date   WBC 4.4 05/19/2020   HGB 15.1 05/19/2020   HCT 44.5 05/19/2020   MCV 99.3 05/19/2020   PLT 115.0 (L) 05/19/2020     Chemistry      Component Value Date/Time   NA 139 05/19/2020 0943   NA 140 03/18/2018 1126   NA 142 03/23/2013 1424   K 3.8 05/19/2020 0943   K  4.7 03/23/2013 1424   CL 107 05/19/2020 0943   CL 106 09/22/2012 1428   CO2 26 05/19/2020 0943   CO2 28 03/23/2013 1424   BUN 15 05/19/2020 0943   BUN 15 03/18/2018 1126   BUN 18.1 03/23/2013 1424   CREATININE 0.82 05/19/2020 0943   CREATININE 0.97 12/04/2019 1304   CREATININE 0.85 10/25/2014 1040   CREATININE 0.8 03/23/2013 1424      Component Value Date/Time   CALCIUM 8.8 05/19/2020 0943   CALCIUM 9.4 03/23/2013 1424   ALKPHOS 70 05/19/2020 0943   ALKPHOS 62 03/23/2013 1424   AST 23 05/19/2020 0943   AST 24 12/04/2019 1304   AST 25 03/23/2013 1424   ALT 25 05/19/2020 0943   ALT 26 12/04/2019 1304   ALT 27 03/23/2013 1424   BILITOT 1.0 05/19/2020 0943   BILITOT 1.5 (H) 12/04/2019 1304   BILITOT 0.86 03/23/2013 1424        Impression and Plan:  80 year old man with:  1.    ITP presented with mild thrombocytopenia in 2009 and has not required any treatment.    The natural course of this disease was reviewed at this time.  Laboratory testing in the last 13 years for reiterated.  His platelet count has remained above 100,000 and has not required any treatment.  Treatment options including steroids, IVIG, rituximab and growth factor support will be deferred at this time unless he develops worsening thrombocytopenia.  He is agreeable with this plan.  2.    Cutaneous T-cell lymphoma diagnosed in 2012.  He was found to have mycosis fungoides and treated with topical treatment.  He is not requiring any treatment at this time without any evidence of relapsed disease.    3.  Follow-up: He will return in 6 months for a follow-up.  20 minutes were spent on this encounter.  Time was dedicated to reviewing laboratory data, treatment options and future plan of care discussion.     Zola Button, MD 1/25/20222:56 PM

## 2020-06-08 DIAGNOSIS — L821 Other seborrheic keratosis: Secondary | ICD-10-CM | POA: Diagnosis not present

## 2020-06-08 DIAGNOSIS — D2271 Melanocytic nevi of right lower limb, including hip: Secondary | ICD-10-CM | POA: Diagnosis not present

## 2020-06-08 DIAGNOSIS — L814 Other melanin hyperpigmentation: Secondary | ICD-10-CM | POA: Diagnosis not present

## 2020-06-08 DIAGNOSIS — D225 Melanocytic nevi of trunk: Secondary | ICD-10-CM | POA: Diagnosis not present

## 2020-06-08 DIAGNOSIS — Z85828 Personal history of other malignant neoplasm of skin: Secondary | ICD-10-CM | POA: Diagnosis not present

## 2020-06-08 DIAGNOSIS — D485 Neoplasm of uncertain behavior of skin: Secondary | ICD-10-CM | POA: Diagnosis not present

## 2020-06-08 DIAGNOSIS — L578 Other skin changes due to chronic exposure to nonionizing radiation: Secondary | ICD-10-CM | POA: Diagnosis not present

## 2020-06-08 DIAGNOSIS — Z808 Family history of malignant neoplasm of other organs or systems: Secondary | ICD-10-CM | POA: Diagnosis not present

## 2020-06-08 DIAGNOSIS — L570 Actinic keratosis: Secondary | ICD-10-CM | POA: Diagnosis not present

## 2020-06-09 ENCOUNTER — Encounter: Payer: Self-pay | Admitting: Family Medicine

## 2020-06-09 ENCOUNTER — Other Ambulatory Visit: Payer: Self-pay

## 2020-06-09 ENCOUNTER — Ambulatory Visit (INDEPENDENT_AMBULATORY_CARE_PROVIDER_SITE_OTHER): Payer: Medicare Other | Admitting: Family Medicine

## 2020-06-09 VITALS — BP 144/78 | HR 60 | Temp 97.7°F | Ht 65.0 in | Wt 150.8 lb

## 2020-06-09 DIAGNOSIS — J301 Allergic rhinitis due to pollen: Secondary | ICD-10-CM | POA: Diagnosis not present

## 2020-06-09 DIAGNOSIS — E7800 Pure hypercholesterolemia, unspecified: Secondary | ICD-10-CM | POA: Diagnosis not present

## 2020-06-09 DIAGNOSIS — F5104 Psychophysiologic insomnia: Secondary | ICD-10-CM | POA: Diagnosis not present

## 2020-06-09 DIAGNOSIS — F331 Major depressive disorder, recurrent, moderate: Secondary | ICD-10-CM

## 2020-06-09 DIAGNOSIS — I10 Essential (primary) hypertension: Secondary | ICD-10-CM | POA: Diagnosis not present

## 2020-06-09 MED ORDER — FLOVENT HFA 110 MCG/ACT IN AERO: INHALATION_SPRAY | RESPIRATORY_TRACT | 1 refills | Status: DC

## 2020-06-09 MED ORDER — FLUTICASONE PROPIONATE 50 MCG/ACT NA SUSP: 1.0000 | Freq: Two times a day (BID) | NASAL | 0 refills | Status: DC

## 2020-06-09 MED ORDER — ALPRAZOLAM 0.5 MG PO TABS
0.5000 mg | ORAL_TABLET | Freq: Two times a day (BID) | ORAL | 1 refills | Status: DC | PRN
Start: 2020-06-09 — End: 2021-05-16

## 2020-06-09 MED ORDER — AZITHROMYCIN 250 MG PO TABS: ORAL_TABLET | ORAL | 0 refills | Status: DC

## 2020-06-09 NOTE — Assessment & Plan Note (Signed)
Worsened. Will refill flonase today.

## 2020-06-09 NOTE — Assessment & Plan Note (Signed)
Stable. Continue xanax as needed

## 2020-06-09 NOTE — Assessment & Plan Note (Signed)
At goal per [facility]. Continue coreg 3.125mg  twice daily and losartan 50mg  daily.

## 2020-06-09 NOTE — Progress Notes (Signed)
   [name] is a 80 y.o. male who presents today for an office visit.  Assessment/Plan:  New/Acute Problems: Sinusitis Given multiple months of symptoms will start azithromycin.  Will refill Flonase today as well.  May consider trial of Augmentin if does not respond to azithromycin.  Chronic Problems Addressed Today: Chronic insomnia Stable. Continue xanax as needed.   Allergic rhinitis, flonase prn Worsened. Will refill flonase today.   Essential hypertension At goal per JNC. Continue coreg 3.125mg  twice daily and losartan 50mg  daily.   Hyperlipidemia Stable. Lipids at goal. Continue lipitor 20mg  daily.      Subjective:  HPI:  See A/p.  He has also had issues with sinus congestion for the past couple of months.  He has had yellowish tinted discharge.  No fevers or chills.  Some pressure pressure.       Objective:  Physical Exam: BP (!) 144/78   Pulse 60   Temp 97.7 F (36.5 C) (Temporal)   Ht 5\' 5"  (1.651 m)   Wt 150 lb 12.8 oz (68.4 kg)   SpO2 98%   BMI 25.09 kg/m   Gen: No acute distress, resting comfortably CV: Regular rate and rhythm with no murmurs appreciated Pulm: Normal work of breathing, clear to auscultation bilaterally with no crackles, wheezes, or rhonchi Neuro: Grossly normal, moves all extremities Psych: Normal affect and thought content  Time Spent: 50 minutes of total time was spent on the date of the encounter performing the following actions: chart review prior to seeing the patient including recent specialist visits, obtaining history, performing a medically necessary exam, counseling on the treatment plan, placing orders, and documenting in our EHR.        [name]. Jerline Pain, MD 06/09/2020 2:56 PM

## 2020-06-09 NOTE — Assessment & Plan Note (Signed)
Stable. Lipids at goal. Continue lipitor 20mg  daily.

## 2020-06-09 NOTE — Patient Instructions (Signed)
It was very nice to see you today!  I will refill your medications today.  Please start the azithromycin.  Come back in a year for your next check up, or sooner if needed.   Take care, Dr Jerline Pain  Please try these tips to maintain a healthy lifestyle:   Eat at least 3 REAL meals and 1-2 snacks per day.  Aim for no more than 5 hours between eating.  If you eat breakfast, please do so within one hour of getting up.    Each meal should contain half fruits/vegetables, one quarter protein, and one quarter carbs (no bigger than a computer mouse)   Cut down on sweet beverages. This includes juice, soda, and sweet tea.     Drink at least 1 glass of water with each meal and aim for at least 8 glasses per day   Exercise at least 150 minutes every week.

## 2020-06-17 ENCOUNTER — Encounter: Payer: Self-pay | Admitting: Family Medicine

## 2020-07-19 DIAGNOSIS — H35373 Puckering of macula, bilateral: Secondary | ICD-10-CM | POA: Diagnosis not present

## 2020-07-19 DIAGNOSIS — H04123 Dry eye syndrome of bilateral lacrimal glands: Secondary | ICD-10-CM | POA: Diagnosis not present

## 2020-07-19 DIAGNOSIS — H524 Presbyopia: Secondary | ICD-10-CM | POA: Diagnosis not present

## 2020-07-19 DIAGNOSIS — H40013 Open angle with borderline findings, low risk, bilateral: Secondary | ICD-10-CM | POA: Diagnosis not present

## 2020-09-29 ENCOUNTER — Other Ambulatory Visit: Payer: Self-pay | Admitting: Family Medicine

## 2020-10-20 ENCOUNTER — Telehealth: Payer: Self-pay | Admitting: Family Medicine

## 2020-10-20 NOTE — Progress Notes (Signed)
  Chronic Care Management   Outreach Note  10/20/2020 Name: [name] MRN: [id number] DOB: Jun 18, 1940  Referred by: Vivi Barrack, MD Reason for referral : No chief complaint on file.   An unsuccessful telephone outreach was attempted today. The patient was referred to the pharmacist for assistance with care management and care coordination.   Follow Up Plan:   Lauretta Grill Upstream Scheduler

## 2020-12-02 ENCOUNTER — Telehealth: Payer: Self-pay | Admitting: Family Medicine

## 2020-12-02 NOTE — Chronic Care Management (AMB) (Signed)
  Chronic Care Management   Outreach Note  12/02/2020 Name: [name] MRN: [id number] DOB: Oct 12, 1940  Referred by: Vivi Barrack, MD Reason for referral : No chief complaint on file.   Third unsuccessful telephone outreach was attempted today. The patient was referred to the pharmacist for assistance with care management and care coordination.   Follow Up Plan:   Lauretta Grill Upstream Scheduler

## 2020-12-06 ENCOUNTER — Other Ambulatory Visit: Payer: Self-pay

## 2020-12-06 ENCOUNTER — Inpatient Hospital Stay: Payer: Medicare Other | Attending: Oncology

## 2020-12-06 ENCOUNTER — Inpatient Hospital Stay (HOSPITAL_BASED_OUTPATIENT_CLINIC_OR_DEPARTMENT_OTHER): Payer: Medicare Other | Admitting: Oncology

## 2020-12-06 VITALS — BP 140/67 | HR 60 | Temp 97.9°F | Resp 18 | Ht 65.0 in | Wt 152.6 lb

## 2020-12-06 DIAGNOSIS — Z79899 Other long term (current) drug therapy: Secondary | ICD-10-CM | POA: Diagnosis not present

## 2020-12-06 DIAGNOSIS — Z7982 Long term (current) use of aspirin: Secondary | ICD-10-CM | POA: Diagnosis not present

## 2020-12-06 DIAGNOSIS — D693 Immune thrombocytopenic purpura: Secondary | ICD-10-CM | POA: Insufficient documentation

## 2020-12-06 DIAGNOSIS — C8400 Mycosis fungoides, unspecified site: Secondary | ICD-10-CM | POA: Diagnosis not present

## 2020-12-06 LAB — CBC WITH DIFFERENTIAL (CANCER CENTER ONLY)
Abs Immature Granulocytes: 0 10*3/uL (ref 0.00–0.07)
Basophils Absolute: 0 10*3/uL (ref 0.0–0.1)
Basophils Relative: 1 %
Eosinophils Absolute: 0.1 10*3/uL (ref 0.0–0.5)
Eosinophils Relative: 3 %
HCT: 45.2 % (ref 39.0–52.0)
Hemoglobin: 15 g/dL (ref 13.0–17.0)
Immature Granulocytes: 0 %
Lymphocytes Relative: 31 %
Lymphs Abs: 1.4 10*3/uL (ref 0.7–4.0)
MCH: 33.3 pg (ref 26.0–34.0)
MCHC: 33.2 g/dL (ref 30.0–36.0)
MCV: 100.2 fL — ABNORMAL HIGH (ref 80.0–100.0)
Monocytes Absolute: 0.4 10*3/uL (ref 0.1–1.0)
Monocytes Relative: 8 %
Neutro Abs: 2.5 10*3/uL (ref 1.7–7.7)
Neutrophils Relative %: 57 %
Platelet Count: 107 10*3/uL — ABNORMAL LOW (ref 150–400)
RBC: 4.51 MIL/uL (ref 4.22–5.81)
RDW: 12.3 % (ref 11.5–15.5)
WBC Count: 4.4 10*3/uL (ref 4.0–10.5)
nRBC: 0 % (ref 0.0–0.2)

## 2020-12-06 NOTE — Progress Notes (Signed)
Hematology and Oncology Follow Up Visit  [name] [id number] 1940/07/15 80 y.o. 12/06/2020 9:25 AM [name], MDParker, Johnathan Greenhouse, MD   Principle Diagnosis: 80 year old man with chronic ITP diagnosed in 2009.  He was found to have fluctuating thrombocytopenia without any additional treatment.     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     Prior Therapy:  Topical therapy for his T-cell cutaneous lymphoma.      Current therapy: Active surveillance.  Interim History: [name] returns today for a follow-up visit.  Since the last visit, he reports no major changes in his health.  He continues to be active and attends activities of daily living without any complaints.  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  He denies any fevers, chills or sweats.  He denies any new skin rashes or lesions.  He denies any bleeding complications.  His performance status quality of life remain excellent.  Medications: Unchanged on review. Current Outpatient Medications  Medication Sig Dispense Refill   acetaminophen (TYLENOL) 650 MG CR tablet Take 650 mg by mouth 2 (two) times daily.     ALPRAZolam (XANAX) 0.5 MG tablet Take 1 tablet (0.5 mg total) by mouth 2 (two) times daily as needed. 180 tablet 1   aspirin 81 MG tablet Take 81 mg by mouth daily.     atorvastatin (LIPITOR) 20 MG tablet Take 1 tablet (20 mg total) by mouth daily. 90 tablet 3   azithromycin (ZITHROMAX) 250 MG tablet Take 2 tabs day 1, then 1 tab daily 6 each 0   carvedilol (COREG) 3.125 MG tablet Take 1 tablet (3.125 mg total) by mouth 2 (two) times daily with a meal. 180 tablet 3   fluticasone (FLONASE) 50 MCG/ACT nasal spray USE 1 SPRAY IN EACH NOSTRILTWICE DAILY 48 g 0   fluticasone (FLOVENT HFA) 110 MCG/ACT inhaler INHALE 2 PUFFS BY MOUTH 2 TIMES DAILY AS NEED 36 each 1   losartan (COZAAR) 50 MG tablet Take 1  tablet (50 mg total) by mouth daily. 90 tablet 3   nitroGLYCERIN (NITROSTAT) 0.4 MG SL tablet Place 1 tablet (0.4 mg total) under the tongue every 5 (five) minutes as needed for chest pain (for chest pain). 25 tablet 6   Omega-3 Fatty Acids (FISH OIL) 1000 MG CAPS Take 1,000 mg by mouth 2 (two) times daily.      Saw Palmetto 450 MG CAPS Take 450 mg by mouth 2 (two) times daily.     VITAMIN D PO Take 1,000 mg by mouth daily.     No current facility-administered medications for this visit.     Allergies:  Allergies  Allergen Reactions   Other Other (See Comments)    ANTI-DEPRESSANTS. ANTI-DEPRESSANTS Antidepressants...has a variety of reactions to different antidepressants   Prozac [Fluoxetine Hcl] Other (See Comments)    nightmares   Oxycodone Other (See Comments)    unknown        Physical Exam: Blood pressure 140/67, pulse 60, temperature 97.9 F (36.6 C), temperature source Oral, resp. rate 18, height '5\' 5"'$  (1.651 m), weight 152 lb 9.6 oz (69.2 kg), SpO2 100 %.    ECOG: 1     General appearance: Alert, awake without any distress. Head: Atraumatic without abnormalities Oropharynx: Without any thrush or ulcers. Eyes: No scleral icterus. Lymph nodes: No lymphadenopathy noted in the cervical, supraclavicular, or axillary nodes Heart:regular rate and rhythm,  without any murmurs or gallops.   Lung: Clear to auscultation without any rhonchi, wheezes or dullness to percussion. Abdomin: Soft, nontender without any shifting dullness or ascites. Musculoskeletal: No clubbing or cyanosis. Neurological: No motor or sensory deficits. Skin: No rashes or lesions.  Multiple nevi noted on his scalp, chest wall on extremities.       Lab Results: Lab Results  Component Value Date   WBC 5.3 06/07/2020   HGB 14.5 06/07/2020   HCT 44.3 06/07/2020   MCV 101.8 (H) 06/07/2020   PLT 122 (L) 06/07/2020     Chemistry      Component Value Date/Time   NA 139 05/19/2020 0943   NA  140 03/18/2018 1126   NA 142 03/23/2013 1424   K 3.8 05/19/2020 0943   K 4.7 03/23/2013 1424   CL 107 05/19/2020 0943   CL 106 09/22/2012 1428   CO2 26 05/19/2020 0943   CO2 28 03/23/2013 1424   BUN 15 05/19/2020 0943   BUN 15 03/18/2018 1126   BUN 18.1 03/23/2013 1424   CREATININE 0.82 05/19/2020 0943   CREATININE 0.97 12/04/2019 1304   CREATININE 0.85 10/25/2014 1040   CREATININE 0.8 03/23/2013 1424      Component Value Date/Time   CALCIUM 8.8 05/19/2020 0943   CALCIUM 9.4 03/23/2013 1424   ALKPHOS 70 05/19/2020 0943   ALKPHOS 62 03/23/2013 1424   AST 23 05/19/2020 0943   AST 24 12/04/2019 1304   AST 25 03/23/2013 1424   ALT 25 05/19/2020 0943   ALT 26 12/04/2019 1304   ALT 27 03/23/2013 1424   BILITOT 1.0 05/19/2020 0943   BILITOT 1.5 (H) 12/04/2019 1304   BILITOT 0.86 03/23/2013 1424        Impression and Plan:  80 year old man with:   1.    Chronic ITP diagnosed in 2009.  He presented with mild thrombocytopenia and did not require treatment.   Laboratory data in the last 13 years were reviewed with platelet count ranged predominantly above 100,000 without any indication for treatment.  Treatment options including steroids, IVIG, rituximab, Nplate among others would be implemented if he develops lower platelet count.  Laboratory data from today reviewed and showed a platelet count that is consistent with his baseline without any need for intervention.  I recommended continued active monitoring.   2.    Cutaneous T-cell lymphoma after presenting with mycosis fungoides that required topical treatment.  Currently on active surveillance without any widespread involvement in his cutaneous lesions.  No systemic manifestations noted at this time.    3.  Health maintenance and age-appropriate cancer screening: We emphasized the importance of maintenance at this time.  He does have a dermatology follow-up on a regular basis given his high risk of cutaneous  malignancy.    4.  Follow-up: He will return in 6 months for a follow-up evaluation.   30 minutes were dedicated to this visit.  The time spent on reviewing laboratory data, disease status update, treatment choices and future plan of care discussion.     Zola Button, MD 7/26/20229:25 AM

## 2020-12-13 ENCOUNTER — Telehealth: Payer: Self-pay

## 2020-12-13 ENCOUNTER — Other Ambulatory Visit: Payer: Self-pay

## 2020-12-13 ENCOUNTER — Encounter (HOSPITAL_BASED_OUTPATIENT_CLINIC_OR_DEPARTMENT_OTHER): Payer: Self-pay | Admitting: *Deleted

## 2020-12-13 ENCOUNTER — Emergency Department (HOSPITAL_BASED_OUTPATIENT_CLINIC_OR_DEPARTMENT_OTHER)
Admission: EM | Admit: 2020-12-13 | Discharge: 2020-12-13 | Disposition: A | Discharge status: Home / Self Care | Payer: Medicare Other | Attending: Emergency Medicine | Admitting: Emergency Medicine

## 2020-12-13 DIAGNOSIS — Z85828 Personal history of other malignant neoplasm of skin: Secondary | ICD-10-CM | POA: Diagnosis not present

## 2020-12-13 DIAGNOSIS — Z96641 Presence of right artificial hip joint: Secondary | ICD-10-CM | POA: Insufficient documentation

## 2020-12-13 DIAGNOSIS — I10 Essential (primary) hypertension: Secondary | ICD-10-CM | POA: Insufficient documentation

## 2020-12-13 DIAGNOSIS — Z955 Presence of coronary angioplasty implant and graft: Secondary | ICD-10-CM | POA: Diagnosis not present

## 2020-12-13 DIAGNOSIS — Z7982 Long term (current) use of aspirin: Secondary | ICD-10-CM | POA: Diagnosis not present

## 2020-12-13 DIAGNOSIS — U071 COVID-19: Secondary | ICD-10-CM | POA: Diagnosis not present

## 2020-12-13 DIAGNOSIS — Z8572 Personal history of non-Hodgkin lymphomas: Secondary | ICD-10-CM | POA: Diagnosis not present

## 2020-12-13 DIAGNOSIS — I2510 Atherosclerotic heart disease of native coronary artery without angina pectoris: Secondary | ICD-10-CM | POA: Diagnosis not present

## 2020-12-13 DIAGNOSIS — Z7951 Long term (current) use of inhaled steroids: Secondary | ICD-10-CM | POA: Diagnosis not present

## 2020-12-13 DIAGNOSIS — Z79899 Other long term (current) drug therapy: Secondary | ICD-10-CM | POA: Diagnosis not present

## 2020-12-13 DIAGNOSIS — R059 Cough, unspecified: Secondary | ICD-10-CM | POA: Diagnosis present

## 2020-12-13 LAB — BASIC METABOLIC PANEL
Anion gap: 7 (ref 5–15)
BUN: 13 mg/dL (ref 8–23)
CO2: 27 mmol/L (ref 22–32)
Calcium: 8.5 mg/dL — ABNORMAL LOW (ref 8.9–10.3)
Chloride: 106 mmol/L (ref 98–111)
Creatinine, Ser: 0.75 mg/dL (ref 0.61–1.24)
GFR, Estimated: >60 mL/min (ref >60)
Glucose, Bld: 112 mg/dL — ABNORMAL HIGH (ref 70–99)
Potassium: 4.1 mmol/L (ref 3.5–5.1)
Sodium: 140 mmol/L (ref 135–145)

## 2020-12-13 LAB — RESP PANEL BY RT-PCR (FLU A&B, COVID) ARPGX2
Influenza A by PCR: NEGATIVE
Influenza B by PCR: NEGATIVE
SARS Coronavirus 2 by RT PCR: POSITIVE — AB

## 2020-12-13 MED ORDER — NIRMATRELVIR/RITONAVIR (PAXLOVID)TABLET: 3.0000 | ORAL_TABLET | Freq: Two times a day (BID) | ORAL | 0 refills | Status: AC

## 2020-12-13 NOTE — ED Provider Notes (Signed)
[facility] EMERGENCY DEPT Provider Note   CSN: [id number] Arrival date & time: 12/13/20  1316     History Chief Complaint  Patient presents with   Sore Throat   Cough    [name] is a 80 y.o. male.  [name] is fully vaccinated for COVID-19 including having had 2 boosters.  He spends most of his time at the nursing home where his wife is near death and is suffering from severe dementia.  After returning home last night, he developed a sore throat and an irritated cough.  He has tested negative for COVID-19 x2 via home test.  The history is provided by the patient.  Sore Throat This is a new problem. The current episode started yesterday. The problem occurs constantly. The problem has not changed since onset.Pertinent negatives include no chest pain, no abdominal pain, no headaches and no shortness of breath. Nothing aggravates the symptoms. Nothing relieves the symptoms. He has tried nothing for the symptoms. The treatment provided no relief.  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 Ardis Hughs 4/13 12/21/2017   Presbycusis of both ears 02/07/2016   Primary osteoarthritis of right knee 10/20/2015   SVT (supraventricular tachycardia) ([place]) 11/02/2012   Chronic insomnia 08/07/2012   History of MI (myocardial infarction) 07/24/2011   History of SCC (squamous cell carcinoma) of skin 07/24/2011   MDD (major depressive disorder) 07/24/2011   Chronic ITP (idiopathic thrombocytopenia) ([place]) 06/14/2011   History of kidney stones 06/14/2011   CAD (coronary artery disease) 03/21/2011   Obstructive sleep apnea 09/14/2008   Cardiomyopathy, ischemic 05/16/2008   Essential hypertension 07/10/2007   Hyperlipidemia 11/18/2006   Allergic rhinitis, flonase prn 11/18/2006   Diverticulosis of colon, last colonoscopy 4/18 11/18/2006   Benign prostatic hyperplasia 11/18/2006   Osteoarthritis 11/18/2006    Past Surgical History:  Procedure Laterality Date   CARDIAC CATHETERIZATION  10/03/2012   CATARACT EXTRACTION W/ INTRAOCULAR LENS  IMPLANT, BILATERAL Bilateral 2000's   COLONOSCOPY  CORONARY ANGIOPLASTY WITH STENT PLACEMENT  05/29/2007   "1" (10/03/2012)   CYST EXCISION  1980's?   "or fatty tumor; taken off my back" (10/03/2012)   KNEE SURGERY     right   LEFT HEART CATHETERIZATION WITH CORONARY ANGIOGRAM N/A 10/03/2012   Procedure: LEFT HEART CATHETERIZATION WITH CORONARY ANGIOGRAM;  Surgeon: Burnell Blanks, MD;  Location[facility] C[facility] LAB;  Service: Cardiovascular;  Laterality: N/A;   LUMBAR EPIDURAL INJECTION  ~ 2004   MOHS SURGERY Bilateral 2011-2012   "top of my head"  (10/03/2012)   PARTIAL KNEE ARTHROPLASTY Right 11/08/2015   Procedure: RIGHT UNICOMPARTMENTAL KNEE;  Surgeon: Renette Butters, MD;  Location: [facility];  Service: Orthopedics;  Laterality: Right;   TONSILLECTOMY  ~ 1945   VASECTOMY         Family History  Problem Relation Age of Onset   Alzheimer's disease Mother    Lymphoma Father    Coronary artery disease Brother    COPD Brother    Diabetes Brother    Colon cancer Paternal Uncle    Diabetes Brother     Social History   Tobacco Use   Smoking status: Never   Smokeless tobacco: Never  Vaping Use   Vaping Use: Never used  Substance Use Topics   Alcohol use: No    Alcohol/week: 0.0 standard drinks   Drug use: No    Home Medications Prior to Admission medications   Medication Sig Start Date End Date Taking? Authorizing Provider  acetaminophen (TYLENOL) 650 MG CR tablet Take 650 mg by mouth 2 (two) times daily.    [provider]  ALPRAZolam Duanne Moron) 0.5 MG tablet Take 1 tablet (0.5 mg total) by mouth 2 (two) times daily as needed. 06/09/20   Vivi Barrack, MD  aspirin 81 MG tablet Take 81 mg by mouth daily.    [provider]  atorvastatin (LIPITOR) 20 MG tablet Take 1 tablet (20 mg total) by mouth daily. 03/07/20   Burnell Blanks, MD  azithromycin (ZITHROMAX) 250 MG tablet Take 2 tabs day 1, then 1 tab daily 06/09/20   Vivi Barrack, MD  carvedilol (COREG) 3.125 MG tablet Take 1 tablet (3.125 mg total) by mouth 2 (two) times daily with a meal. 03/07/20   Burnell Blanks, MD  fluticasone (FLONASE) 50 MCG/ACT nasal spray USE 1 SPRAY IN EACH NOSTRILTWICE DAILY 09/29/20   Vivi Barrack, MD  fluticasone (FLOVENT HFA) 110 MCG/ACT inhaler INHALE 2 PUFFS BY MOUTH 2 TIMES DAILY AS NEED 06/09/20   Vivi Barrack, MD  losartan (COZAAR) 50 MG tablet Take 1 tablet (50 mg total) by mouth daily. 03/07/20   Burnell Blanks, MD  nitroGLYCERIN (NITROSTAT) 0.4 MG SL tablet Place 1 tablet (0.4 mg total)  under the tongue every 5 (five) minutes as needed for chest pain (for chest pain). 03/07/20   Burnell Blanks, MD  Omega-3 Fatty Acids (FISH OIL) 1000 MG CAPS Take 1,000 mg by mouth 2 (two) times daily.     [provider]  Saw Palmetto 450 MG CAPS Take 450 mg by mouth 2 (two) times daily.    [provider]  VITAMIN D PO Take 1,000 mg by mouth daily.    [provider]    Allergies    Other, Prozac [fluoxetine hcl], and Oxycodone  Review of Systems   Review of Systems  Constitutional:  Negative for chills and fever.  HENT:  Positive for sore throat. Negative for ear  pain.   Eyes:  Negative for pain and visual disturbance.  Respiratory:  Positive for cough. Negative for shortness of breath.   Cardiovascular:  Negative for chest pain and palpitations.  Gastrointestinal:  Negative for abdominal pain and vomiting.  Genitourinary:  Negative for dysuria and hematuria.  Musculoskeletal:  Negative for arthralgias and back pain.  Skin:  Negative for color change and rash.  Neurological:  Negative for seizures, syncope and headaches.  All other systems reviewed and are negative.  Physical Exam Updated Vital Signs BP (!) 151/59 (BP Location: Right Arm)   Pulse 67   Temp 98 F (36.7 C) (Oral)   Resp 16   Ht '5\' 5"'$  (1.651 m)   Wt 68 kg   SpO2 98%   BMI 24.96 kg/m   Physical Exam Vitals and nursing note reviewed.  Constitutional:      Appearance: Normal appearance.  HENT:     Head: Normocephalic and atraumatic.     Mouth/Throat:     Mouth: Mucous membranes are moist. Oral lesions present.     Pharynx: Posterior oropharyngeal erythema present.     Comments: Small blisters at the posterior pharynx Cardiovascular:     Rate and Rhythm: Normal rate and regular rhythm.     Heart sounds: Normal heart sounds.  Pulmonary:     Effort: Pulmonary effort is normal.     Breath sounds: Normal breath sounds.  Musculoskeletal:     Cervical back: Normal range  of motion.     Right lower leg: No edema.     Left lower leg: No edema.  Skin:    General: Skin is warm and dry.  Neurological:     General: No focal deficit present.     Mental Status: He is alert and oriented to person, place, and time.  Psychiatric:        Mood and Affect: Mood normal.        Behavior: Behavior normal.    ED Results / Procedures / Treatments   Labs (all labs ordered are listed, but only abnormal results are displayed) Labs Reviewed  RESP PANEL BY RT-PCR (FLU A&B, COVID) ARPGX2 - Abnormal; Notable for the following components:      Result Value   SARS Coronavirus 2 by RT PCR POSITIVE (*)    All other components within normal limits  BASIC METABOLIC PANEL - Abnormal; Notable for the following components:   Glucose, Bld 112 (*)    Calcium 8.5 (*)    All other components within normal limits    EKG None  Radiology No results found.  Procedures Procedures   Medications Ordered in ED Medications - No data to display  ED Course  I have reviewed the triage vital signs and the nursing notes.  Pertinent labs & imaging results that were available during my care of the patient were reviewed by me and considered in my medical decision making (see chart for details).    MDM Rules/Calculators/A&P                           [name] presents with symptoms of COVID-19.  He is fully vaccinated and boosted.  Test was positive.  Renal function normal.  He was given Paxlovid and counseled on its risk and benefits as well as possibility for rebound reaction. Final Clinical Impression(s) / ED Diagnoses Final diagnoses:  [id number]    Rx / DC Orders ED Discharge Orders  Ordered    nirmatrelvir/ritonavir EUA (PAXLOVID) TABS  2 times daily        12/13/20 1516             Arnaldo Natal, MD 12/13/20 [phone]

## 2020-12-13 NOTE — ED Triage Notes (Signed)
Pt states last night he developed a cough and sore throat, has tested x 2 for Covid, which have been negative.  sates he feels like his lungs are irritated.

## 2020-12-13 NOTE — Telephone Encounter (Signed)
Nurse Assessment Nurse: Doyle Askew, RN, Beth Date/Time Eilene Ghazi Time): 12/13/2020 12:18:27 PM Confirm and document reason for call. If symptomatic, describe symptoms. ---Caller states pt is experiencing a sore throat, hoarseness and irritation in his lungs. Caller was transferred from the office - tested negative for Covid today per home test. Caller states symptoms started last night. Does the patient have any new or worsening symptoms? ---Yes Will a triage be completed? ---Yes Related visit to physician within the last 2 weeks? ---No Does the PT have any chronic conditions? (i.e. diabetes, asthma, this includes High risk factors for pregnancy, etc.) ---Yes List chronic conditions. ---lymphoma, bleeding disorders, heart problems Is this a behavioral health or substance abuse call? ---No Guidelines Guideline Title Affirmed Question Affirmed Notes Nurse Date/Time (Eastern Time) COVID-19 - Diagnosed or Suspected [1] HIGH RISK for severe COVID complications (e.g., weak immune system, age > 33 Doyle Askew, RN, [place] 12/13/2020 12:20:13 PM PLEASE NOTE: All timestamps contained within this report are represented as [place] Standard Time. CONFIDENTIALTY NOTICE: This fax transmission is intended only for the addressee. It contains information that is legally privileged, confidential or otherwise protected from use or disclosure. If you are not the intended recipient, you are strictly prohibited from reviewing, disclosing, copying using or disseminating any of this information or taking any action in reliance on or regarding this information. If you have received this fax in error, please notify us immediately by telephone so that we can arrange for its return to [place]. Phone: [phone], Toll-Free: [phone], Fax: [phone] Page: 2 of 3 Call Id: [id number] Guidelines Guideline Title Affirmed Question Affirmed Notes Nurse Date/Time Eilene Ghazi Time) years, obesity with BMI > 25, pregnant,  chronic lung disease or other chronic medical condition) AND [2] COVID symptoms (e.g., cough, fever) (Exceptions: Already seen by PCP and no new or worsening symptoms.) Disp. Time Eilene Ghazi Time) Disposition Final User 12/13/2020 12:34:51 PM Call PCP Now Yes Doyle Askew, RN, Beth Caller Disagree/Comply Comply Caller Understands Yes PreDisposition Call Doctor Care Advice Given Per Guideline CALL PCP NOW: * You need to discuss this with your doctor (or NP/PA). GENERAL CARE ADVICE FOR COVID-19 SYMPTOMS: * The symptoms are generally treated the same whether you have COVID-19, influenza or some other respiratory virus. * Feeling dehydrated: Drink extra liquids. If the air in your home is dry, use a humidifier. * Fever: For fever over 101 F (38.3 C), take acetaminophen every 4 to 6 hours (Adults 650 mg) OR ibuprofen every 6 to 8 hours (Adults 400 mg). Before taking any medicine, read all the instructions on the package. Do not take aspirin unless your doctor has prescribed it for you. * Muscle aches, headache, and other pains: Often this comes and goes with the fever. Take acetaminophen every 4 to 6 hours (Adults 650 mg) OR ibuprofen every 6 to 8 hours (Adults 400 mg). Before taking any medicine, read all the instructions on the package. COVID-19 - HOW TO PROTECT OTHERS - WHEN YOU ARE SICK WITH COVID-19: CALL BACK IF: * You become worse CARE ADVICE given per COVID-19 - DIAGNOSED OR SUSPECTED (Adult) guideline. * STAY HOME A MINIMUM OF 5 DAYS: Home isolation is needed for at least 5 days after the symptoms started. Stay home from school or work if you are sick. Do NOT go to religious services, child care centers, shopping, or other public places. Do NOT use public transportation (e.g., bus, taxis, ridesharing). Do NOT allow any visitors to your home. Leave the house only if you need to seek urgent medical care.  Comments User: Melene Muller, RN Date/Time Eilene Ghazi Time): 12/13/2020 12:33:43 PM No answer when  trying to call back line - finally called office number and talked to someone who was going to try and get ahold of nurse...all nurses at lunch - was told to tell pt to be seen in ED or UC User: Melene Muller, RN Date/Time Eilene Ghazi Time): 12/13/2020 12:35:10 PM Caller states he is on the other line with Dr now PLEASE NOTE: All timestamps contained within this report are represented as [place] Standard Time. CONFIDENTIALTY NOTICE: This fax transmission is intended only for the addressee. It contains information that is legally privileged, confidential or otherwise protected from use or disclosure. If you are not the intended recipient, you are strictly prohibited from reviewing, disclosing, copying using or disseminating any of this information or taking any action in reliance on or regarding this information. If you have received this fax in error, please notify us immediately by telephone so that we can arrange for its return to [place]. Phone: [phone], Toll-Free: [phone], Fax: [phone] Page: 3 of 3 Call Id: [id number] Referrals GO TO FACILITY UNDECIDED

## 2020-12-14 NOTE — Telephone Encounter (Signed)
See note

## 2020-12-21 ENCOUNTER — Telehealth (INDEPENDENT_AMBULATORY_CARE_PROVIDER_SITE_OTHER): Payer: Medicare Other | Admitting: Physician Assistant

## 2020-12-21 ENCOUNTER — Other Ambulatory Visit: Payer: Self-pay

## 2020-12-21 ENCOUNTER — Telehealth: Payer: Self-pay

## 2020-12-21 ENCOUNTER — Encounter: Payer: Self-pay | Admitting: Physician Assistant

## 2020-12-21 VITALS — Ht 65.0 in | Wt 150.0 lb

## 2020-12-21 DIAGNOSIS — U071 COVID-19: Secondary | ICD-10-CM | POA: Diagnosis not present

## 2020-12-21 NOTE — Progress Notes (Signed)
TELEPHONE ENCOUNTER   Patient verbally agreed to telephone visit and is aware that copayment and coinsurance may apply. Patient was treated using telemedicine according to accepted telemedicine protocols.  Location of the patient: home Location of provider: [place] Names of all persons participating in the telemedicine service and role in the encounter: Inda Coke, [place] , [place]  Subjective:   Chief Complaint  Patient presents with   Cough     HPI   Cough Pt was diagnosed with COVID-19 on 8/2 at ED. Pt said has completed course Paxlovid. He tested negative after completion of Paxlovid but then started to feel bad again and tested positive again today for COVID. Pt says cough is getting worse, it is a hard dry cough. Denies fever or chills, n/v/d, chest pain, SOB. Was taking Tylenol and was using Delsym ran out yesterday.  Hx of intermittent chronic cough at baseline that he has been prescribed flovent for. Has not started using this.  Wife is in a nursing home currently and is actively dying.  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 Ardis Hughs 4/13 12/21/2017   Presbycusis of both ears 02/07/2016   Primary osteoarthritis of right knee 10/20/2015   SVT (supraventricular tachycardia) ([place]) 11/02/2012   Chronic insomnia 08/07/2012   History of MI (myocardial infarction) 07/24/2011   History of SCC (squamous cell carcinoma) of skin 07/24/2011   MDD (major depressive disorder) 07/24/2011   Chronic ITP (idiopathic thrombocytopenia) ([place]) 06/14/2011   History of kidney stones 06/14/2011   CAD (coronary artery disease) 03/21/2011   Obstructive sleep apnea 09/14/2008   Cardiomyopathy, ischemic  05/16/2008   Essential hypertension 07/10/2007   Hyperlipidemia 11/18/2006   Allergic rhinitis, flonase prn 11/18/2006   Diverticulosis of colon, last colonoscopy 4/18 11/18/2006   Benign prostatic hyperplasia 11/18/2006   Osteoarthritis 11/18/2006   Social History   Tobacco Use   Smoking status: Never   Smokeless tobacco: Never  Substance Use Topics   Alcohol use: No    Alcohol/week: 0.0 standard drinks    Current Outpatient Medications:    acetaminophen (TYLENOL) 650 MG CR tablet, Take 650 mg by mouth 2 (two) times daily., Disp: , Rfl:    ALPRAZolam (XANAX) 0.5 MG tablet, Take 1 tablet (0.5 mg total) by mouth 2 (two) times daily as needed., Disp: 180 tablet, Rfl: 1   aspirin 81 MG tablet, Take 81 mg by mouth daily., Disp: , Rfl:    atorvastatin (LIPITOR) 20 MG tablet, Take 1 tablet (20 mg total) by mouth daily., Disp: 90 tablet, Rfl: 3   carvedilol (COREG) 3.125 MG tablet, Take 1 tablet (3.125 mg total) by mouth 2 (two) times daily with a meal., Disp: 180 tablet, Rfl: 3   fluticasone (FLONASE) 50 MCG/ACT nasal spray, USE 1 SPRAY IN EACH NOSTRILTWICE DAILY, Disp: 48 g, Rfl: 0   losartan (COZAAR) 50 MG tablet, Take 1 tablet (50 mg total) by mouth daily., Disp: 90 tablet, Rfl: 3   nitroGLYCERIN (NITROSTAT) 0.4 MG SL tablet, Place 1 tablet (0.4 mg total) under the tongue every 5 (five) minutes as needed for chest pain (for chest pain)., Disp: 25 tablet, Rfl: 6   Omega-3 Fatty Acids (FISH OIL) 1000 MG CAPS, Take  1,000 mg by mouth 2 (two) times daily. , Disp: , Rfl:    Saw Palmetto 450 MG CAPS, Take 450 mg by mouth 2 (two) times daily., Disp: , Rfl:    VITAMIN D PO, Take 1,000 mg by mouth daily., Disp: , Rfl:    fluticasone (FLOVENT HFA) 110 MCG/ACT inhaler, INHALE 2 PUFFS BY MOUTH 2 TIMES DAILY AS NEED (Patient not taking: Reported on 12/21/2020), Disp: 36 each, Rfl: 1 Allergies  Allergen Reactions   Other Other (See Comments)    ANTI-DEPRESSANTS. ANTI-DEPRESSANTS Antidepressants...has  a variety of reactions to different antidepressants   Prozac [Fluoxetine Hcl] Other (See Comments)    nightmares   Oxycodone Other (See Comments)    unknown    Assessment & Plan:   1. COVID-19   Suspect paxlovid rebound. Restart isolation. Trial flovent BID. He has this on hand, does not need fill. Continue to push fluids. Low threshold for in office evaluation if worsening symptoms. Worsening precautions discussed.  No orders of the defined types were placed in this encounter.  No orders of the defined types were placed in this encounter.   Inda Coke, [place] 12/21/2020  Time spent with the patient: 10 minutes, spent in obtaining information about his symptoms, reviewing his previous labs, evaluations, and treatments, counseling him about his condition (please see the discussed topics above), and developing a plan to further investigate it; he had a number of questions which I addressed.

## 2020-12-21 NOTE — Telephone Encounter (Signed)
Patient states he tested positive and was given medication. He took at home test and was negative both Monday and Tuesday but is testing positive again. Wondering what the next steps are.

## 2020-12-21 NOTE — Telephone Encounter (Signed)
Please schedule virtual with Dr. Jerline Pain or available provider to discuss.

## 2020-12-22 ENCOUNTER — Telehealth: Payer: Self-pay

## 2020-12-22 NOTE — Telephone Encounter (Signed)
Please see message and advise 

## 2020-12-22 NOTE — Telephone Encounter (Signed)
Spoke to pt asked him what his temperature is? Pt said it was 100.4 this morning. Took Tylenol Arthritis 2 tablets this AM. Asked pt what color he is coughing up? Pt said yellow sputum.  Samantha asked if you would be interested in a chest x-[place]? Pt I don't feel I need one at this time I am not having any trouble breathing. Told pt okay. Aldona Bar said have a suspicion that you maybe dealing with COVID rebound but he could also be developing PNA. Told pt if he changes his mind about the x-Fye please call. Also continue Tylenol as needed and drink plenty of fluids and rest, any increase in symptoms, fever > 101 or SOB please call office. Pt verbalized understanding.

## 2020-12-22 NOTE — Telephone Encounter (Signed)
Patient was seen yesterday.

## 2020-12-22 NOTE — Telephone Encounter (Signed)
Patient called in wanting to update Dr.[name] and Aldona Bar that he is now running a low grade fever and is coughing up mucous.

## 2020-12-23 ENCOUNTER — Telehealth: Payer: Self-pay | Admitting: *Deleted

## 2020-12-23 NOTE — Telephone Encounter (Signed)
Spoke to pt asked him how he has been feeling today? Pt said a little better. Asked him if any fever or any trouble breathing? Pt said fever is down 99.4 and he is not having any trouble breathing. He said he bought a pulse oximetry and it is reading 97%. Told him good, just wanted to make sure you were doing better before the weekend. Pt verbalized understanding.

## 2020-12-23 NOTE — Telephone Encounter (Signed)
Samantha notified of pt's condition.

## 2020-12-25 ENCOUNTER — Encounter: Payer: Self-pay | Admitting: Physician Assistant

## 2020-12-26 ENCOUNTER — Other Ambulatory Visit: Payer: Self-pay | Admitting: Physician Assistant

## 2020-12-26 MED ORDER — AZITHROMYCIN 250 MG PO TABS: ORAL_TABLET | ORAL | 0 refills | Status: AC

## 2020-12-26 NOTE — Telephone Encounter (Signed)
Samantha, pt is c/o hard cough and yellow nasal drainage started over the weekend. Denies fever or chills. Pt wants to know if he can have another round of Paxlovid? Please advise.

## 2020-12-26 NOTE — Telephone Encounter (Signed)
Spoke to pt told him calling about My Chart message. Asked him if he wants to go for Chest x-[place]? Pt said  yes when can I com in. Told pt unfortunately we do not have x-[place] here right now,  you will have to go to [facility] x-[place] department. No appt needed just walk in main entrance and they will direct you to x-[place] must be there before 4:30. Pt verbalized understanding and said he does not have a car right now sharing with daughter. He said I will try to go today but if not maybe I can get there tomorrow morning. Pt said he is now having yellow nasal drainage and still tested positive for COVID this morning. He said he wants to know if he can have another round of Paxlovid due to immunocompromised? Told pt I will discuss with Aldona Bar and get back to you. Pt verbalized understanding.

## 2020-12-26 NOTE — Telephone Encounter (Signed)
Patient is calling in wondering if the orders can be at [facility].

## 2020-12-26 NOTE — Telephone Encounter (Signed)
Spoke to pt told him per Aldona Bar, I spoke with Dr. Jerline Pain.  A second round of paxlovid is not indicated or recommended at this time based on current evidence-based guidelines.  We can go ahead and treat with oral antibiotic to cover for secondary sinus infection and early pneumonia. I have sent this in to [facility] in [place]. We do not need a chest xray at this time  If no improvement or worsening, please call us. Pt verbalized understanding.

## 2020-12-26 NOTE — Telephone Encounter (Signed)
Nurse Assessment Nurse: Jearld Pies, RN, Lovena Le Date/Time Eilene Ghazi Time): 12/26/2020 8:16:27 AM Confirm and document reason for call. If symptomatic, describe symptoms. ---Caller states she tested positive for Covid on August 2nd. Still positive for Covid. He has yellow discharge coming from his nose and coughing. Has taken 5 days of Paxlovid that was finished on August 7th. Drinking fluids and urinating normally. Last dose of tylenol this morning. Pain with coughing. Denies chest pain, SOB, or any other symptoms at this time. Does the patient have any new or worsening symptoms? ---Yes Will a triage be completed? ---Yes Related visit to physician within the last 2 weeks? ---No Does the PT have any chronic conditions? (i.e. diabetes, asthma, this includes High risk factors for pregnancy, etc.) ---Unknown Is this a behavioral health or substance abuse call? ---No Guidelines Guideline Title Affirmed Question Affirmed Notes Nurse Date/Time ([place] Time) COVID-19 - Diagnosed or Suspected Chest pain or pressure Jake Bathe 12/26/2020 8:18:39 AM PLEASE NOTE: All timestamps contained within this report are represented as [place] Standard Time. CONFIDENTIALTY NOTICE: This fax transmission is intended only for the addressee. It contains information that is legally privileged, confidential or otherwise protected from use or disclosure. If you are not the intended recipient, you are strictly prohibited from reviewing, disclosing, copying using or disseminating any of this information or taking any action in reliance on or regarding this information. If you have received this fax in error, please notify us immediately by telephone so that we can arrange for its return to [place]. Phone: [phone], Toll-Free: [phone], Fax: [phone] Page: 2 of 2 Call Id: [id number] [place]. Time Eilene Ghazi Time) Disposition Final User 12/26/2020 8:22:01 AM Go to ED Now (or PCP triage) Yes Jearld Pies, RN,  Apolonio Schneiders Disagree/Comply Comply Caller Understands Yes PreDisposition Call Doctor Care Advice Given Per Guideline GO TO ED NOW (OR PCP TRIAGE): Referrals GO TO [facility] - ED

## 2021-02-12 ENCOUNTER — Encounter: Payer: Self-pay | Admitting: Oncology

## 2021-02-14 ENCOUNTER — Other Ambulatory Visit: Payer: Self-pay | Admitting: *Deleted

## 2021-02-14 ENCOUNTER — Encounter: Payer: Self-pay | Admitting: *Deleted

## 2021-02-14 DIAGNOSIS — D693 Immune thrombocytopenic purpura: Secondary | ICD-10-CM

## 2021-02-14 NOTE — Progress Notes (Signed)
Internal referral was put in for patient preference to be seen by Dr.Sherrill at [facility].

## 2021-02-14 NOTE — Progress Notes (Signed)
Internal referral received from Dr Alen Blew, will schedule after chart review

## 2021-02-24 DIAGNOSIS — Z23 Encounter for immunization: Secondary | ICD-10-CM | POA: Diagnosis not present

## 2021-03-16 ENCOUNTER — Encounter: Payer: Self-pay | Admitting: Family Medicine

## 2021-03-16 ENCOUNTER — Ambulatory Visit (INDEPENDENT_AMBULATORY_CARE_PROVIDER_SITE_OTHER): Payer: Medicare Other | Admitting: Family Medicine

## 2021-03-16 ENCOUNTER — Other Ambulatory Visit: Payer: Self-pay

## 2021-03-16 VITALS — BP 147/84 | HR 87 | Temp 97.8°F | Ht 65.0 in | Wt 149.6 lb

## 2021-03-16 DIAGNOSIS — N400 Enlarged prostate without lower urinary tract symptoms: Secondary | ICD-10-CM | POA: Diagnosis not present

## 2021-03-16 DIAGNOSIS — N390 Urinary tract infection, site not specified: Secondary | ICD-10-CM

## 2021-03-16 DIAGNOSIS — I10 Essential (primary) hypertension: Secondary | ICD-10-CM | POA: Diagnosis not present

## 2021-03-16 DIAGNOSIS — B356 Tinea cruris: Secondary | ICD-10-CM

## 2021-03-16 DIAGNOSIS — U071 COVID-19: Secondary | ICD-10-CM

## 2021-03-16 DIAGNOSIS — Z636 Dependent relative needing care at home: Secondary | ICD-10-CM | POA: Diagnosis not present

## 2021-03-16 LAB — POCT URINALYSIS DIPSTICK
Bilirubin, UA: NEGATIVE
Blood, UA: NEGATIVE
Glucose, UA: NEGATIVE
Ketones, UA: NEGATIVE
Leukocytes, UA: NEGATIVE
Nitrite, UA: NEGATIVE
Protein, UA: NEGATIVE
Spec Grav, UA: 1.015 (ref 1.010–1.025)
Urobilinogen, UA: 1 E.U./dL
pH, UA: 6 (ref 5.0–8.0)

## 2021-03-16 MED ORDER — TRIAMCINOLONE ACETONIDE 0.1 % EX CREA: TOPICAL_CREAM | Freq: Two times a day (BID) | CUTANEOUS | 0 refills | Status: DC

## 2021-03-16 MED ORDER — CEPHALEXIN 500 MG PO CAPS: 500.0000 mg | ORAL_CAPSULE | Freq: Two times a day (BID) | ORAL | 0 refills | Status: AC

## 2021-03-16 NOTE — Assessment & Plan Note (Signed)
Wife still in nursing home. Slowly declining. Offered words of support and encouragement.

## 2021-03-16 NOTE — Assessment & Plan Note (Signed)
Goal per JNC 8 on coreg 3.125mg  twice daily losartan 50 mg daily.

## 2021-03-16 NOTE — Assessment & Plan Note (Signed)
On saw palmetto.  Symptoms are overall stable.

## 2021-03-16 NOTE — Patient Instructions (Addendum)
It was very nice to see you today!  Please start the keflex.   Please start the cream.  We will see you back in 2 months for your annual physical. Please come back sooner if needed.  Take care, Dr Jerline Pain  PLEASE NOTE:  If you had any lab tests please let us know if you have not heard back within a few days. You may see your results on mychart before we have a chance to review them but we will give you a call once they are reviewed by [place]. If we ordered any referrals today, please let us know if you have not heard from their office within the next week.   Please try these tips to maintain a healthy lifestyle:  Eat at least 3 REAL meals and 1-2 snacks per day.  Aim for no more than 5 hours between eating.  If you eat breakfast, please do so within one hour of getting up.   Each meal should contain half fruits/vegetables, one quarter protein, and one quarter carbs (no bigger than a computer mouse)  Cut down on sweet beverages. This includes juice, soda, and sweet tea.   Drink at least 1 glass of water with each meal and aim for at least 8 glasses per day  Exercise at least 150 minutes every week.

## 2021-03-16 NOTE — Progress Notes (Signed)
   [name] is a 80 y.o. male who presents today for an office visit.  Assessment/Plan:  New/Acute Problems: COVID Normal exam today.  No lingering symptoms.  He has recovered fully.  UTI We will empirically start Keflex while we await culture results.  No red flags or signs of systemic illness  Tinea cruris Start topical ketoconazole  Chronic Problems Addressed Today: Caregiver burden, of wife Wife still in nursing home. Slowly declining. Offered words of support and encouragement.    Benign prostatic hyperplasia On saw palmetto.  Symptoms are overall stable.  Essential hypertension Goal per JNC 8 on coreg 3.125mg  twice daily losartan 50 mg daily.     Subjective:  HPI:  He is  concern for possible UTI. He complain of burning with urination. This started about a week ago. He also had issue with itching in the groin area. This issue started couple of month a ago. He notes it turned into a rash. He tried over the counter medication with no improvement. He admit trying Lotrimin ointment and baby powder for itching. He would like to have urine culture and urinalysis checked today. He denies fever, chills or nausea. Denies urgency, hematuria or urinary hesitancy.        Objective:  Physical Exam: BP (!) 147/84   Pulse 87   Temp 97.8 F (36.6 C) (Temporal)   Ht 5\' 5"  (1.651 m)   Wt 149 lb 9.6 oz (67.9 kg)   SpO2 98%   BMI 24.89 kg/m   Gen: No acute distress, resting comfortably CV: Regular rate and rhythm with no murmurs appreciated Pulm: Normal work of breathing, clear to auscultation bilaterally with no crackles, wheezes, or rhonchi GU: Faint erythematous rash noted in inguinal crease bilaterally. Neuro: Grossly normal, moves all extremities Psych: Normal affect and thought content       I,Savera Zaman,acting as a scribe for Dimas Chyle, MD.,have documented all relevant documentation on the behalf of Dimas Chyle, MD,as directed by  Dimas Chyle, MD while in the  presence of Dimas Chyle, MD.   I, Dimas Chyle, MD, have reviewed all documentation for this visit. The documentation on 03/16/21 for the exam, diagnosis, procedures, and orders are all accurate and complete.  Time Spent: 40 minutes of total time was spent on the date of the encounter performing the following actions: chart review prior to seeing the patient including his recent ED visit for COVID, obtaining history, performing a medically necessary exam, counseling on the treatment plan, placing orders, and documenting in our EHR.   [name]. Jerline Pain, MD 03/16/2021 12:07 PM

## 2021-03-31 ENCOUNTER — Ambulatory Visit (INDEPENDENT_AMBULATORY_CARE_PROVIDER_SITE_OTHER): Payer: Medicare Other | Admitting: Cardiovascular Disease

## 2021-03-31 ENCOUNTER — Encounter: Payer: Self-pay | Admitting: Cardiovascular Disease

## 2021-03-31 ENCOUNTER — Other Ambulatory Visit: Payer: Self-pay

## 2021-03-31 VITALS — BP 130/72 | HR 59 | Ht 65.0 in | Wt 151.0 lb

## 2021-03-31 DIAGNOSIS — I2510 Atherosclerotic heart disease of native coronary artery without angina pectoris: Secondary | ICD-10-CM

## 2021-03-31 DIAGNOSIS — I351 Nonrheumatic aortic (valve) insufficiency: Secondary | ICD-10-CM

## 2021-03-31 DIAGNOSIS — E7800 Pure hypercholesterolemia, unspecified: Secondary | ICD-10-CM

## 2021-03-31 DIAGNOSIS — I10 Essential (primary) hypertension: Secondary | ICD-10-CM

## 2021-03-31 NOTE — Patient Instructions (Signed)
Medication Instructions:  Your physician recommends that you continue on your current medications as directed. Please refer to the Current Medication list given to you today.  *If you need a refill on your cardiac medications before your next appointment, please call your pharmacy*   Lab Work: None  If you have labs (blood work) drawn today and your tests are completely normal, you will receive your results only by: [place] (if you have MyChart) OR A paper copy in the mail If you have any lab test that is abnormal or we need to change your treatment, we will call you to review the results.   Testing/Procedures: Your physician has requested that you have an echocardiogram. Echocardiography is a painless test that uses sound waves to create images of your heart. It provides your doctor with information about the size and shape of your heart and how well your heart's chambers and valves are working. This procedure takes approximately one hour. There are no restrictions for this procedure.    Follow-Up: At [facility], you and your health needs are our priority.  As part of our continuing mission to provide you with exceptional heart care, we have created designated Provider Care Teams.  These Care Teams include your primary Cardiologist (physician) and Advanced Practice Providers (APPs -  Physician Assistants and Nurse Practitioners) who all work together to provide you with the care you need, when you need it.  Your next appointment:   1 year(s)  The format for your next appointment:   In Person  Provider:   Lauree Chandler, MD

## 2021-03-31 NOTE — Progress Notes (Signed)
Chief Complaint  Patient presents with   Follow-up    CAD     History of Present Illness: 80 yo male with history of CAD, HTN, hyperlipidemia, idiopathic thrombocytopenia and OSA here today for cardiac follow up. He had an anterior STEMI in January 2009 and had a drug eluting stent placed in the LAD. His ejection fraction improved from 25% to 40-50% following the first event. No evidence of AAA on abdominal aortic u/s December 2013. He has since been diagnosed with T-cell lymphoma but this is in remission. Nuclear stress test 2014 with scar in the anterior wall, apex and inferoapical wall with LVEF of 55%. No ischemic EKG changes. Cardiac cath May 2014 with mild to moderate CAD, no obstructive lesions. He has chronic left arm weakness and aching. No change with exertion. Echo November 2019 with LVEF=45-50%. Trivial AI, MR, TR.   He is here today for follow up. The patient denies any chest pain, dyspnea, palpitations, lower extremity edema, orthopnea, PND, dizziness, near syncope or syncope.   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 Beryle Beams   History of blood transfusion 1941   History of colon polyps    Hyperlipidemia    ITP (idiopathic thrombocytopenic purpura)    Kidney stones x 2, both passed    Myocardial infarction ([place]) 2009   Seasonal allergies    Squamous carcinoma left scalp    T-cell lymphoma ([place])    "I'm in stage I; original site was on my left chest; had a place around my waist treated last summer; got a place on back of my left leg" (10/03/2012)    Past  Surgical History:  Procedure Laterality Date   CARDIAC CATHETERIZATION  10/03/2012   CATARACT EXTRACTION W/ INTRAOCULAR LENS  IMPLANT, BILATERAL Bilateral 2000's   COLONOSCOPY     CORONARY ANGIOPLASTY WITH STENT PLACEMENT  05/29/2007   "1" (10/03/2012)   CYST EXCISION  1980's?   "or fatty tumor; taken off my back" (10/03/2012)   KNEE SURGERY     right   LEFT HEART CATHETERIZATION WITH CORONARY ANGIOGRAM N/A 10/03/2012   Procedure: LEFT HEART CATHETERIZATION WITH CORONARY ANGIOGRAM;  Surgeon: Burnell Blanks, MD;  Location: [facility] CATH LAB;  Service: Cardiovascular;  Laterality: N/A;   LUMBAR EPIDURAL INJECTION  ~ 2004   MOHS SURGERY Bilateral 2011-2012   "top of my head" (10/03/2012)   PARTIAL KNEE ARTHROPLASTY Right 11/08/2015   Procedure: RIGHT UNICOMPARTMENTAL KNEE;  Surgeon: Renette Butters, MD;  Location: [facility];  Service: Orthopedics;  Laterality: Right;   TONSILLECTOMY  ~ 1945   VASECTOMY      Current Outpatient Medications  Medication Sig Dispense Refill   acetaminophen (TYLENOL) 650 MG CR tablet Take 650 mg by mouth 2 (two) times daily.     ALPRAZolam (XANAX) 0.5 MG tablet Take 1 tablet (0.5 mg total) by mouth 2 (two) times daily as needed. 180 tablet 1   aspirin 81 MG tablet Take 81 mg by mouth daily.  atorvastatin (LIPITOR) 20 MG tablet Take 1 tablet (20 mg total) by mouth daily. 90 tablet 3   carvedilol (COREG) 3.125 MG tablet Take 1 tablet (3.125 mg total) by mouth 2 (two) times daily with a meal. 180 tablet 3   fluticasone (FLONASE) 50 MCG/ACT nasal spray USE 1 SPRAY IN EACH NOSTRILTWICE DAILY 48 g 0   fluticasone (FLOVENT HFA) 110 MCG/ACT inhaler INHALE 2 PUFFS BY MOUTH 2 TIMES DAILY AS NEED 36 each 1   ketoconazole 2%-triamcinolone 0.1% 1:2 cream mixture Apply topically 2 (two) times daily. 45 g 0   losartan (COZAAR) 50 MG tablet Take 1 tablet (50 mg total) by mouth daily. 90 tablet 3   nitroGLYCERIN (NITROSTAT) 0.4 MG SL tablet Place 1 tablet (0.4 mg total) under the  tongue every 5 (five) minutes as needed for chest pain (for chest pain). 25 tablet 6   Omega-3 Fatty Acids (FISH OIL) 1000 MG CAPS Take 1,000 mg by mouth 2 (two) times daily.      Saw Palmetto 450 MG CAPS Take 450 mg by mouth 2 (two) times daily.     VITAMIN D PO Take 1,000 mg by mouth daily.     No current facility-administered medications for this visit.    Allergies  Allergen Reactions   Other Other (See Comments)    ANTI-DEPRESSANTS. ANTI-DEPRESSANTS Antidepressants...has a variety of reactions to different antidepressants   Prozac [Fluoxetine Hcl] Other (See Comments)    nightmares   Oxycodone Other (See Comments)    unknown    Social History   Socioeconomic History   Marital status: Married    Spouse name: Not on file   Number of children: 2   Years of education: Not on file   Highest education level: Not on file  Occupational History   Occupation: Retired    Comment: [employer]   Tobacco Use   Smoking status: Never   Smokeless tobacco: Never  Vaping Use   Vaping Use: Never used  Substance and Sexual Activity   Alcohol use: No    Alcohol/week: 0.0 standard drinks   Drug use: No   Sexual activity: Not Currently  Other Topics Concern   Not on file  Social History Narrative   Wife is in a skilled facility- dementia    2 children- 1 locally and 1 in [place]: golfing    Social Determinants of Health   Financial Resource Strain: Not on file  Food Insecurity: Not on file  Transportation Needs: Not on file  Physical Activity: Not on file  Stress: Not on file  Social Connections: Not on file  Intimate Partner Violence: Not on file    Family History  Problem Relation Age of Onset   Alzheimer's disease Mother    Lymphoma Father    Coronary artery disease Brother    COPD Brother    Diabetes Brother    Colon cancer Paternal Uncle    Diabetes Brother     Review of Systems:  As stated in the HPI and otherwise negative.   BP 130/72    Pulse (!) 59   Ht 5\' 5"  (1.651 m)   Wt 151 lb (68.5 kg)   SpO2 98%   BMI 25.13 kg/m   Physical Examination:  General: Well developed, well nourished, NAD  HEENT: OP clear, mucus membranes moist  SKIN: warm, dry. No rashes. Neuro: No focal deficits  Musculoskeletal: Muscle strength 5/5 all ext  Psychiatric: Mood and affect normal  Neck:  No JVD, no carotid bruits, no thyromegaly, no lymphadenopathy.  Lungs:Clear bilaterally, no wheezes, rhonci, crackles Cardiovascular: Regular rate and rhythm. No murmurs, gallops or rubs. Abdomen:Soft. Bowel sounds present. Non-tender.  Extremities: No lower extremity edema. Pulses are 2 + in the bilateral DP/PT.  Cardiac cath may 2014: Left main: No obstructive disease.  Left Anterior Descending Artery: Large caliber vessel that courses to the apex. The proximal vessel has mild plaque disease. The mid stented segment is patent. The small caliber diagonal branch is patent with ostial 70% stenosis as it is jailed by the stent and unchanged in appearance.  Circumflex Artery: Moderate caliber vessel with moderate caliber obtuse marginal branch with no significant disease. The AV groove Circumflex has a 40% stenosis just after the takeoff of the OM branch, unchanged.  Right Coronary Artery: Large, dominant vessel with serial 20% lesions throughout the proximal vessel. The mid vessel has a 40% stenosis. The distal vessel has mild plaque disease.  Left Ventricular Angiogram: LVEF 55% with anterior and apical hypokinesis.   EKG:  EKG is ordered today. The ekg ordered today demonstrates sinus, artifact  Echo November 2019:  - Left ventricle: The cavity size was normal. Systolic function was   mildly reduced. The estimated ejection fraction was in the range   of 45% to 50%. There is akinesis of the apical myocardium. There   was an increased relative contribution of atrial contraction to   ventricular filling. Doppler parameters are consistent with    abnormal left ventricular relaxation (grade 1 diastolic   dysfunction). - Aortic valve: Trileaflet; normal thickness, mildly calcified   leaflets. There was trivial regurgitation. - Mitral valve: Calcified annulus. There was trivial regurgitation. - Tricuspid valve: There was trivial regurgitation. - Pulmonic valve: There was trivial regurgitation. - Pulmonary arteries: PA peak pressure: 34 mm Hg (S).  Recent Labs: 05/19/2020: ALT 25; TSH 2.19 12/06/2020: Hemoglobin 15.0; Platelet Count 107 12/13/2020: BUN 13; Creatinine, Ser 0.75; Potassium 4.1; Sodium 140   Lipid Panel Lipid Panel     Component Value Date/Time   CHOL 124 05/19/2020 0943   TRIG 58.0 05/19/2020 0943   HDL 65.70 05/19/2020 0943   CHOLHDL 2 05/19/2020 0943   VLDL 11.6 05/19/2020 0943   LDLCALC 47 05/19/2020 0943   LDLDIRECT 137.3 08/08/2006 1059      Wt Readings from Last 3 Encounters:  03/31/21 151 lb (68.5 kg)  03/16/21 149 lb 9.6 oz (67.9 kg)  12/21/20 150 lb (68 kg)     Other studies Reviewed: Additional studies/ records that were reviewed today include: . Review of the above records demonstrates:   Assessment and Plan:   1. CAD without angina: No chest pain. Will continue ASA, statin and beta blocker.   2. HTN: BP is well controlled. No changes  3. Hyperlipidemia: Lipids well controlled in 2020. Most recent LDL 47 in January 2022.  Continue statin  4. Aortic insufficiency: Mild by echo in 2019. Repeat echo now.   Current medicines are reviewed at length with the patient today.  The patient does not have concerns regarding medicines.  The following changes have been made:  no change  Labs/ tests ordered today include:   Orders Placed This Encounter  Procedures   EKG 12-Lead   ECHOCARDIOGRAM COMPLETE    Disposition:   F/U with me in 12 months  Signed, Lauree Chandler, MD 03/31/2021 4:46 PM    [facility] [address], [place], [place]  [address] Phone: [phone];  Fax: [phone]  

## 2021-04-02 ENCOUNTER — Encounter: Payer: Self-pay | Admitting: Cardiovascular Disease

## 2021-04-02 DIAGNOSIS — I2510 Atherosclerotic heart disease of native coronary artery without angina pectoris: Secondary | ICD-10-CM

## 2021-04-02 DIAGNOSIS — E7800 Pure hypercholesterolemia, unspecified: Secondary | ICD-10-CM

## 2021-04-04 MED ORDER — CARVEDILOL 3.125 MG PO TABS: 3.1250 mg | ORAL_TABLET | Freq: Two times a day (BID) | ORAL | 3 refills | Status: DC

## 2021-04-04 MED ORDER — LOSARTAN POTASSIUM 50 MG PO TABS: 50.0000 mg | ORAL_TABLET | Freq: Every day | ORAL | 3 refills | Status: DC

## 2021-04-04 MED ORDER — ATORVASTATIN CALCIUM 20 MG PO TABS: 20.0000 mg | ORAL_TABLET | Freq: Every day | ORAL | 3 refills | Status: DC

## 2021-04-04 MED ORDER — NITROGLYCERIN 0.4 MG SL SUBL: 0.4000 mg | SUBLINGUAL_TABLET | SUBLINGUAL | 2 refills | Status: DC | PRN

## 2021-05-16 ENCOUNTER — Other Ambulatory Visit: Payer: Self-pay

## 2021-05-16 ENCOUNTER — Encounter: Payer: Self-pay | Admitting: Family Medicine

## 2021-05-16 ENCOUNTER — Ambulatory Visit (INDEPENDENT_AMBULATORY_CARE_PROVIDER_SITE_OTHER): Payer: Medicare Other | Admitting: Family Medicine

## 2021-05-16 VITALS — BP 134/80 | HR 58 | Temp 97.7°F | Ht 65.0 in | Wt 149.8 lb

## 2021-05-16 DIAGNOSIS — F5104 Psychophysiologic insomnia: Secondary | ICD-10-CM

## 2021-05-16 DIAGNOSIS — I471 Supraventricular tachycardia: Secondary | ICD-10-CM

## 2021-05-16 DIAGNOSIS — I10 Essential (primary) hypertension: Secondary | ICD-10-CM

## 2021-05-16 DIAGNOSIS — D693 Immune thrombocytopenic purpura: Secondary | ICD-10-CM

## 2021-05-16 DIAGNOSIS — R351 Nocturia: Secondary | ICD-10-CM | POA: Diagnosis not present

## 2021-05-16 DIAGNOSIS — N400 Enlarged prostate without lower urinary tract symptoms: Secondary | ICD-10-CM | POA: Diagnosis not present

## 2021-05-16 DIAGNOSIS — M25551 Pain in right hip: Secondary | ICD-10-CM

## 2021-05-16 DIAGNOSIS — R739 Hyperglycemia, unspecified: Secondary | ICD-10-CM | POA: Diagnosis not present

## 2021-05-16 DIAGNOSIS — E7800 Pure hypercholesterolemia, unspecified: Secondary | ICD-10-CM

## 2021-05-16 DIAGNOSIS — F331 Major depressive disorder, recurrent, moderate: Secondary | ICD-10-CM | POA: Diagnosis not present

## 2021-05-16 LAB — COMPREHENSIVE METABOLIC PANEL
ALT: 18 U/L (ref 0–53)
AST: 19 U/L (ref 0–37)
Albumin: 3.9 g/dL (ref 3.5–5.2)
Alkaline Phosphatase: 62 U/L (ref 39–117)
BUN: 13 mg/dL (ref 6–23)
CO2: 28 mEq/L (ref 19–32)
Calcium: 8.9 mg/dL (ref 8.4–10.5)
Chloride: 102 mEq/L (ref 96–112)
Creatinine, Ser: 0.76 mg/dL (ref 0.40–1.50)
GFR: 83.65 mL/min (ref >60.00)
Glucose, Bld: 94 mg/dL (ref 70–99)
Potassium: 4.1 mEq/L (ref 3.5–5.1)
Sodium: 136 mEq/L (ref 135–145)
Total Bilirubin: 1.5 mg/dL — ABNORMAL HIGH (ref 0.2–1.2)
Total Protein: 5.8 g/dL — ABNORMAL LOW (ref 6.0–8.3)

## 2021-05-16 LAB — CBC
HCT: 44.8 % (ref 39.0–52.0)
Hemoglobin: 14.8 g/dL (ref 13.0–17.0)
MCHC: 33 g/dL (ref 30.0–36.0)
MCV: 99.7 fl (ref 78.0–100.0)
Platelets: 112 10*3/uL — ABNORMAL LOW (ref 150.0–400.0)
RBC: 4.49 Mil/uL (ref 4.22–5.81)
RDW: 12.8 % (ref 11.5–15.5)
WBC: 4.1 10*3/uL (ref 4.0–10.5)

## 2021-05-16 LAB — LIPID PANEL
Cholesterol: 119 mg/dL (ref 0–200)
HDL: 58.8 mg/dL (ref >39.00)
LDL Cholesterol: 46 mg/dL (ref 0–99)
NonHDL: 59.8
Total CHOL/HDL Ratio: 2
Triglycerides: 68 mg/dL (ref 0.0–149.0)
VLDL: 13.6 mg/dL (ref 0.0–40.0)

## 2021-05-16 LAB — TSH: TSH: 1.82 u[IU]/mL (ref 0.35–5.50)

## 2021-05-16 LAB — PSA: PSA: 1.29 ng/mL (ref 0.10–4.00)

## 2021-05-16 LAB — HEMOGLOBIN A1C: Hgb A1c MFr Bld: 6.2 % (ref 4.6–6.5)

## 2021-05-16 MED ORDER — ALPRAZOLAM 0.5 MG PO TABS: 0.5000 mg | ORAL_TABLET | Freq: Two times a day (BID) | ORAL | 1 refills | Status: DC | PRN

## 2021-05-16 NOTE — Assessment & Plan Note (Signed)
Stable.  Will refill Xanax.  Uses as needed.  Minimal side effects.

## 2021-05-16 NOTE — Assessment & Plan Note (Signed)
On Lipitor 20 mg daily.  Check lipids today.

## 2021-05-16 NOTE — Progress Notes (Signed)
Chief Complaint:  [name] is a 81 y.o. male who presents today for his annual comprehensive physical exam.    Assessment/Plan:  New/Acute Problems: Right Hip Pain Likely multifactorial.  Likely has underlying osteoarthritis.  Also has some muscular strain.  May have mild component of trochanteric bursitis as well given distribution of pain.  He follows with orthopedics and will follow up with him soon.  Avoiding NSAIDs due to his cardiac history.  We discussed home exercises.  Declined referral to PT.  Right Hand Numbness Improving.  Likely mild carpal tunnel given his positive Tinel sign on exam.  He will follow-up with orthopedics if this continues to be an issue.  Chronic Problems Addressed Today: SVT (supraventricular tachycardia) Follows with cardiology.  Rate controlled today.  Chronic insomnia Stable.  Will refill Xanax.  Uses as needed.  Minimal side effects.  Chronic ITP (idiopathic thrombocytopenia) (HCC) Check CBC.  Follows with heme-onc.   MDD (major depressive disorder) Overall stable.  Has been under little bit more stress recently but this is manageable.  Essential hypertension At goal on Coreg 3.125 mg twice daily and losartan 50 mg daily.  Hyperlipidemia On Lipitor 20 mg daily.  Check lipids today.  Benign prostatic hyperplasia Check PSa.   Preventative Healthcare: Will get blood work done today. UTD on screenings and vaccines.   Patient Counseling(The following topics were reviewed and/or handout was given):  -Nutrition: Stressed importance of moderation in sodium/caffeine intake, saturated fat and cholesterol, caloric balance, sufficient intake of fresh fruits, vegetables, and fiber.  -Stressed the importance of regular exercise.   -Substance Abuse: Discussed cessation/primary prevention of tobacco, alcohol, or other drug use; driving or other dangerous activities under the influence; availability of treatment for abuse.   -Injury prevention:  Discussed safety belts, safety helmets, smoke detector, smoking near bedding or upholstery.   -Sexuality: Discussed sexually transmitted diseases, partner selection, use of condoms, avoidance of unintended pregnancy and contraceptive alternatives.   -Dental health: Discussed importance of regular tooth brushing, flossing, and dental visits.  -Health maintenance and immunizations reviewed. Please refer to Health maintenance section.  Return to care in 1 year for next preventative visit.     Subjective:  HPI:  He has no acute complaints today.   He is here right hip pain. This has been going on for few months. He notes he is unable to sleep on right side. He report symptoms improved after walking in the morning. He tried Tynelol with mild relief. He has not tried any other treatments. Denies snapping or clicking sounds in the hip. No obvious injuries or precipitating events.  He has increased stress recently dealing with the health of his wife. His wife had flu and cousin was diagnosed with Covid. His wife has been at nursing home. Additionally, he was diagnosed with Covid on 8/2. He has recovered fully and has been doing well. However, he notes he started to feel bad about few days ago. He denies fever or chills.  He also complain of tingling sensation in right hand. Started a few weeks ago. Seems to be improving.  He still have issue with cough. He sometimes had discharge. Have some nasal congestion. No fever or chills.   Lifestyle Diet: None specific.  Exercise: Trying to exercise.   Depression screen [facility] 2/9 03/16/2021  Decreased Interest 0  Down, Depressed, Hopeless 0  PHQ - 2 Score 0  Altered sleeping 0  Tired, decreased energy 0  Change in appetite 0  Feeling bad  or failure about yourself  0  Trouble concentrating 0  Moving slowly or fidgety/restless 0  Suicidal thoughts 0  PHQ-9 Score 0  Difficult doing work/chores Not difficult at all  Some recent data might be hidden     Health Maintenance Due  Topic Date Due   Zoster Vaccines- Shingrix (1 of 2) Never done   Pneumonia Vaccine 35+ Years old (2 - PPSV23 if available, else PCV20) 08/11/2014   TETANUS/TDAP  06/14/2019     ROS: Per HPI, otherwise a complete review of systems was negative.   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 Ardis Hughs 4/13 12/21/2017   Presbycusis of both ears 02/07/2016   Primary osteoarthritis of right knee 10/20/2015   SVT (supraventricular tachycardia) ([place]) 11/02/2012   Chronic insomnia 08/07/2012   History of MI (myocardial infarction) 07/24/2011   History of SCC (squamous cell carcinoma) of skin 07/24/2011   MDD (major depressive disorder) 07/24/2011   Chronic ITP (idiopathic thrombocytopenia) ([place])  06/14/2011   History of kidney stones 06/14/2011   CAD (coronary artery disease) 03/21/2011   Obstructive sleep apnea 09/14/2008   Cardiomyopathy, ischemic 05/16/2008   Essential hypertension 07/10/2007   Hyperlipidemia 11/18/2006   Allergic rhinitis, flonase prn 11/18/2006   Diverticulosis of colon, last colonoscopy 4/18 11/18/2006   Benign prostatic hyperplasia 11/18/2006   Osteoarthritis 11/18/2006   Past Surgical History:  Procedure Laterality Date   CARDIAC CATHETERIZATION  10/03/2012   CATARACT EXTRACTION W/ INTRAOCULAR LENS  IMPLANT, BILATERAL Bilateral 2000's   COLONOSCOPY     CORONARY ANGIOPLASTY WITH STENT PLACEMENT  05/29/2007   "1" (10/03/2012)   CYST EXCISION  1980's?   "or fatty tumor; taken off my back" (10/03/2012)   KNEE SURGERY     right   LEFT HEART CATHETERIZATION WITH CORONARY ANGIOGRAM N/A 10/03/2012   Procedure: LEFT HEART CATHETERIZATION WITH CORONARY ANGIOGRAM;  Surgeon: Burnell Blanks, MD;  Location: [facility] CATH LAB;  Service: Cardiovascular;  Laterality: N/A;   LUMBAR EPIDURAL INJECTION  ~ 2004   MOHS SURGERY Bilateral 2011-2012   "top of my head" (10/03/2012)   PARTIAL KNEE ARTHROPLASTY Right 11/08/2015  Procedure: RIGHT UNICOMPARTMENTAL KNEE;  Surgeon: Renette Butters, MD;  Location: [facility];  Service: Orthopedics;  Laterality: Right;   TONSILLECTOMY  ~ 1945   VASECTOMY      Family History  Problem Relation Age of Onset   Alzheimer's disease Mother    Lymphoma Father    Coronary artery disease Brother    COPD Brother    Diabetes Brother    Colon cancer Paternal Uncle    Diabetes Brother     Medications- reviewed and updated Current Outpatient Medications  Medication Sig Dispense Refill   acetaminophen (TYLENOL) 650 MG CR tablet Take 650 mg by mouth 2 (two) times daily.     aspirin 81 MG tablet Take 81 mg by mouth daily.     atorvastatin (LIPITOR) 20 MG tablet Take 1 tablet (20 mg total) by mouth daily. 90 tablet 3   carvedilol (COREG) 3.125  MG tablet Take 1 tablet (3.125 mg total) by mouth 2 (two) times daily with a meal. 180 tablet 3   fluticasone (FLONASE) 50 MCG/ACT nasal spray USE 1 SPRAY IN EACH NOSTRILTWICE DAILY 48 g 0   fluticasone (FLOVENT HFA) 110 MCG/ACT inhaler INHALE 2 PUFFS BY MOUTH 2 TIMES DAILY AS NEED 36 each 1   ketoconazole 2%-triamcinolone 0.1% 1:2 cream mixture Apply topically 2 (two) times daily. 45 g 0   losartan (COZAAR) 50 MG tablet Take 1 tablet (50 mg total) by mouth daily. 90 tablet 3   nitroGLYCERIN (NITROSTAT) 0.4 MG SL tablet Place 1 tablet (0.4 mg total) under the tongue every 5 (five) minutes as needed for chest pain (for chest pain). 75 tablet 2   Omega-3 Fatty Acids (FISH OIL) 1000 MG CAPS Take 1,000 mg by mouth 2 (two) times daily.      Saw Palmetto 450 MG CAPS Take 450 mg by mouth 2 (two) times daily.     VITAMIN D PO Take 1,000 mg by mouth daily.     ALPRAZolam (XANAX) 0.5 MG tablet Take 1 tablet (0.5 mg total) by mouth 2 (two) times daily as needed. 180 tablet 1   No current facility-administered medications for this visit.    Allergies-reviewed and updated Allergies  Allergen Reactions   Other Other (See Comments)    ANTI-DEPRESSANTS. ANTI-DEPRESSANTS Antidepressants...has a variety of reactions to different antidepressants   Prozac [Fluoxetine Hcl] Other (See Comments)    nightmares   Oxycodone Other (See Comments)    unknown    Social History   Socioeconomic History   Marital status: Married    Spouse name: Not on file   Number of children: 2   Years of education: Not on file   Highest education level: Not on file  Occupational History   Occupation: Retired    Comment: [employer]   Tobacco Use   Smoking status: Never   Smokeless tobacco: Never  Vaping Use   Vaping Use: Never used  Substance and Sexual Activity   Alcohol use: No    Alcohol/week: 0.0 standard drinks   Drug use: No   Sexual activity: Not Currently  Other Topics Concern   Not on file   Social History Narrative   Wife is in a skilled facility- dementia    2 children- 1 locally and 1 in [place]: golfing    Social Determinants of Health   Financial Resource Strain: Not on file  Food Insecurity: Not on file  Transportation Needs: Not on file  Physical Activity: Not on file  Stress: Not on file  Social Connections: Not on file        Objective:  Physical Exam: BP 134/80    Pulse (!) 58    Temp 97.7 F (36.5 C) (Temporal)    Ht 5\' 5"  (1.651 m)    Wt 149 lb 12.8 oz (67.9 kg)    SpO2 99%    BMI 24.93 kg/m   Body mass index is 24.93 kg/m. Wt Readings from Last 3 Encounters:  05/16/21 149 lb 12.8 oz (67.9 kg)  03/31/21 151 lb (68.5 kg)  03/16/21 149 lb 9.6 oz (67.9 kg)   Gen: NAD, resting comfortably HEENT: TMs normal bilaterally. OP clear. No thyromegaly noted.  CV: RRR with no murmurs appreciated Pulm: NWOB, CTAB with no crackles, wheezes, or rhonchi GI: Normal bowel sounds present. Soft, Nontender, Nondistended. MSK: no edema, cyanosis, or clubbing noted - Hand: Right hand without deformities.  Neurovascular intact distally.  Positive Tinel's sign -Right Hip: Limited internal and external rotation.  Greater trochanter nonpainful to palpation.  Neurovascular intact distally. Skin: warm, dry Neuro: CN2-12 grossly intact. Strength 5/5 in upper and lower extremities. Reflexes symmetric and intact bilaterally.  Psych: Normal affect and thought content      I,Savera Zaman,acting as a scribe for Dimas Chyle, MD.,have documented all relevant documentation on the behalf of Dimas Chyle, MD,as directed by  Dimas Chyle, MD while in the presence of Dimas Chyle, MD.   I, Dimas Chyle, MD, have reviewed all documentation for this visit. The documentation on 05/16/21 for the exam, diagnosis, procedures, and orders are all accurate and complete.   Time Spent: 42 minutes of total time was spent on the date of the encounter performing the following actions: chart  review prior to seeing the patient including recent visits, obtaining history, performing a medically necessary exam, counseling on the treatment plan, placing orders, and documenting in our EHR.    [name]. Jerline Pain, MD 05/16/2021 10:34 AM

## 2021-05-16 NOTE — Assessment & Plan Note (Signed)
Follows with cardiology. Rate controlled today.  

## 2021-05-16 NOTE — Assessment & Plan Note (Signed)
At goal on Coreg 3.125 mg twice daily and losartan 50 mg daily.

## 2021-05-16 NOTE — Assessment & Plan Note (Signed)
Overall stable.  Has been under little bit more stress recently but this is manageable.

## 2021-05-16 NOTE — Assessment & Plan Note (Addendum)
Check CBC.  Follows with heme-onc.

## 2021-05-16 NOTE — Assessment & Plan Note (Signed)
Check PSa.  

## 2021-05-16 NOTE — Patient Instructions (Signed)
It was very nice to see you today!  Work on the exercises for your hip.  We will check blood work today.  I will refill your medication.  We will see back in 1 year for your annual  checkup.  Please come back to see [place] sooner if needed.  Take care, Dr Jerline Pain  PLEASE NOTE:  If you had any lab tests please let us know if you have not heard back within a few days. You may see your results on mychart before we have a chance to review them but we will give you a call once they are reviewed by [place]. If we ordered any referrals today, please let us know if you have not heard from their office within the next week.   Please try these tips to maintain a healthy lifestyle:  Eat at least 3 REAL meals and 1-2 snacks per day.  Aim for no more than 5 hours between eating.  If you eat breakfast, please do so within one hour of getting up.   Each meal should contain half fruits/vegetables, one quarter protein, and one quarter carbs (no bigger than a computer mouse)  Cut down on sweet beverages. This includes juice, soda, and sweet tea.   Drink at least 1 glass of water with each meal and aim for at least 8 glasses per day  Exercise at least 150 minutes every week.    Preventive Care 60 Years and Older, Male Preventive care refers to lifestyle choices and visits with your health care provider that can promote health and wellness. Preventive care visits are also called wellness exams. What can I expect for my preventive care visit? Counseling During your preventive care visit, your health care provider may ask about your: Medical history, including: Past medical problems. Family medical history. History of falls. Current health, including: Emotional well-being. Home life and relationship well-being. Sexual activity. Memory and ability to understand (cognition). Lifestyle, including: Alcohol, nicotine or tobacco, and drug use. Access to firearms. Diet, exercise, and sleep habits. Work and  work Statistician. Sunscreen use. Safety issues such as seatbelt and bike helmet use. Physical exam Your health care provider will check your: Height and weight. These may be used to calculate your BMI (body mass index). BMI is a measurement that tells if you are at a healthy weight. Waist circumference. This measures the distance around your waistline. This measurement also tells if you are at a healthy weight and may help predict your risk of certain diseases, such as type 2 diabetes and high blood pressure. Heart rate and blood pressure. Body temperature. Skin for abnormal spots. What immunizations do I need? Vaccines are usually given at various ages, according to a schedule. Your health care provider will recommend vaccines for you based on your age, medical history, and lifestyle or other factors, such as travel or where you work. What tests do I need? Screening Your health care provider may recommend screening tests for certain conditions. This may include: Lipid and cholesterol levels. Diabetes screening. This is done by checking your blood sugar (glucose) after you have not eaten for a while (fasting). Hepatitis C test. Hepatitis B test. HIV (human immunodeficiency virus) test. STI (sexually transmitted infection) testing, if you are at risk. Lung cancer screening. Colorectal cancer screening. Prostate cancer screening. Abdominal aortic aneurysm (AAA) screening. You may need this if you are a current or former smoker. Talk with your health care provider about your test results, treatment options, and if necessary, the need  for more tests. Follow these instructions at home: Eating and drinking  Eat a diet that includes fresh fruits and vegetables, whole grains, lean protein, and low-fat dairy products. Limit your intake of foods with high amounts of sugar, saturated fats, and salt. Take vitamin and mineral supplements as recommended by your health care provider. Do not drink  alcohol if your health care provider tells you not to drink. If you drink alcohol: Limit how much you have to 0-2 drinks a day. Know how much alcohol is in your drink. In the [place] one drink equals one 12 oz bottle of beer (355 mL), one 5 oz glass of wine (148 mL), or one 1 oz glass of hard liquor (44 mL). Lifestyle Brush your teeth every morning and night with fluoride toothpaste. Floss one time each day. Exercise for at least 30 minutes 5 or more days each week. Do not use any products that contain nicotine or tobacco. These products include cigarettes, chewing tobacco, and vaping devices, such as e-cigarettes. If you need help quitting, ask your health care provider. Do not use drugs. If you are sexually active, practice safe sex. Use a condom or other form of protection to prevent STIs. Take aspirin only as told by your health care provider. Make sure that you understand how much to take and what form to take. Work with your health care provider to find out whether it is safe and beneficial for you to take aspirin daily. Ask your health care provider if you need to take a cholesterol-lowering medicine (statin). Find healthy ways to manage stress, such as: Meditation, yoga, or listening to music. Journaling. Talking to a trusted person. Spending time with friends and family. Safety Always wear your seat belt while driving or riding in a vehicle. Do not drive: If you have been drinking alcohol. Do not ride with someone who has been drinking. When you are tired or distracted. While texting. If you have been using any mind-altering substances or drugs. Wear a helmet and other protective equipment during sports activities. If you have firearms in your house, make sure you follow all gun safety procedures. Minimize exposure to UV radiation to reduce your risk of skin cancer. What's next? Visit your health care provider once a year for an annual wellness visit. Ask your health care  provider how often you should have your eyes and teeth checked. Stay up to date on all vaccines. This information is not intended to replace advice given to you by your health care provider. Make sure you discuss any questions you have with your health care provider. Document Revised: 10/26/2020 Document Reviewed: 10/26/2020 Elsevier Patient Education  [place].

## 2021-05-17 NOTE — Progress Notes (Signed)
Please inform patient of the following:  His A1c is borderline elevated but stable. Everything else is stable compared to previous values. Do not need to make any changes to his treatment plan at this time. He should continue working on diet and exercise and we can recheck in a year.  [name]. Jerline Pain, MD 05/17/2021 7:50 AM

## 2021-05-18 ENCOUNTER — Other Ambulatory Visit: Payer: Self-pay

## 2021-05-18 ENCOUNTER — Ambulatory Visit (HOSPITAL_COMMUNITY): Payer: Medicare Other | Attending: Internal Medicine

## 2021-05-18 DIAGNOSIS — E7800 Pure hypercholesterolemia, unspecified: Secondary | ICD-10-CM | POA: Diagnosis not present

## 2021-05-18 DIAGNOSIS — I351 Nonrheumatic aortic (valve) insufficiency: Secondary | ICD-10-CM | POA: Diagnosis not present

## 2021-05-18 DIAGNOSIS — I10 Essential (primary) hypertension: Secondary | ICD-10-CM | POA: Diagnosis not present

## 2021-05-18 DIAGNOSIS — I2510 Atherosclerotic heart disease of native coronary artery without angina pectoris: Secondary | ICD-10-CM

## 2021-05-18 LAB — ECHOCARDIOGRAM COMPLETE
Area-P 1/2: 3.24 cm2
P 1/2 time: 244 msec
S' Lateral: 3.4 cm

## 2021-05-18 MED ORDER — PERFLUTREN LIPID MICROSPHERE
1.0000 mL | INTRAVENOUS | Status: AC | PRN
  Administered 2021-05-18: 1 mL via INTRAVENOUS

## 2021-06-06 ENCOUNTER — Ambulatory Visit: Payer: Medicare Other | Admitting: Oncology

## 2021-06-07 ENCOUNTER — Ambulatory Visit: Payer: Medicare Other | Admitting: Oncology

## 2021-06-08 ENCOUNTER — Inpatient Hospital Stay: Payer: Medicare Other | Attending: Oncology | Admitting: Oncology

## 2021-06-08 ENCOUNTER — Other Ambulatory Visit: Payer: Self-pay

## 2021-06-08 VITALS — BP 156/73 | HR 61 | Temp 97.8°F | Resp 20 | Ht 65.0 in | Wt 151.8 lb

## 2021-06-08 DIAGNOSIS — Z800 Family history of malignant neoplasm of digestive organs: Secondary | ICD-10-CM | POA: Diagnosis not present

## 2021-06-08 DIAGNOSIS — Z8572 Personal history of non-Hodgkin lymphomas: Secondary | ICD-10-CM | POA: Diagnosis not present

## 2021-06-08 DIAGNOSIS — Z86010 Personal history of colonic polyps: Secondary | ICD-10-CM | POA: Diagnosis not present

## 2021-06-08 DIAGNOSIS — Z85828 Personal history of other malignant neoplasm of skin: Secondary | ICD-10-CM | POA: Insufficient documentation

## 2021-06-08 DIAGNOSIS — D693 Immune thrombocytopenic purpura: Secondary | ICD-10-CM | POA: Insufficient documentation

## 2021-06-08 DIAGNOSIS — Z801 Family history of malignant neoplasm of trachea, bronchus and lung: Secondary | ICD-10-CM | POA: Insufficient documentation

## 2021-06-08 DIAGNOSIS — Z8042 Family history of malignant neoplasm of prostate: Secondary | ICD-10-CM | POA: Diagnosis not present

## 2021-06-08 NOTE — Progress Notes (Signed)
[facility] OFFICE PROGRESS NOTE   Diagnosis: ITP  INTERVAL HISTORY:   [name] has been followed by Drs. Granfortuna and Shadad since being diagnosed with ITP in 2009.  He has not required treatment.  He feels well at present.  He has decided to transfer his care to the [place] due to the proximity to his home. He bruises easily and bleeds with trauma.  No other bleeding. He has a remote history of cutaneous T-cell lymphoma.  No recurrent skin rash.  He reports being diagnosed with "factor VII "deficiency by Dr. Beryle Beams.  Past medical history: ITP diagnosed in 2009 Cutaneous T-cell lymphoma diagnosed in 2012, treated with topical therapy Squamous cell carcinoma of the scalp 2013 History of a tubular adenoma the colon Myocardial infarction 2009, status post LAD stent  Past surgical history: Partial right knee replacement 2017  Family history: His father died of "leukemia/lymphoma ".  A brother had esophagus cancer.  There is a history of colon, prostate, and lung cancer in his aunts and uncles.  Social history: He lives alone in [place].  His wife has dementia and is in a nursing facility.  He worked in [employer].  He does not use cigarettes or alcohol.  He received a Red cell transfusion at age 57.  No risk factor for HIV or hepatitis.  He has received COVID-19 and influenza vaccines  Review of systems: Positive-nonproductive cough for years, bruises easily A complete review of systems is otherwise negative  Objective:  Vital signs in last 24 hours:  Blood pressure (!) 156/73, pulse 61, temperature 97.8 F (36.6 C), temperature source Oral, resp. rate 20, height 5\' 5"  (1.651 m), weight 151 lb 12.8 oz (68.9 kg), SpO2 100 %.    Lymphatics: No cervical, supraclavicular, axillary, or inguinal nodes Resp: Lungs clear bilaterally Cardio: Regular rate and rhythm GI: No hepatosplenomegaly, no mass, nontender Vascular: No leg edema  Skin:  Benign-appearing moles over the trunk, changes of senile purpura at the dorsum of the hands, no rash   Lab Results:  Lab Results  Component Value Date   WBC 4.1 05/16/2021   HGB 14.8 05/16/2021   HCT 44.8 05/16/2021   MCV 99.7 05/16/2021   PLT 112.0 (L) 05/16/2021   NEUTROABS 2.5 12/06/2020    CMP  Lab Results  Component Value Date   NA 136 05/16/2021   K 4.1 05/16/2021   CL 102 05/16/2021   CO2 28 05/16/2021   GLUCOSE 94 05/16/2021   BUN 13 05/16/2021   CREATININE 0.76 05/16/2021   CALCIUM 8.9 05/16/2021   PROT 5.8 (L) 05/16/2021   ALBUMIN 3.9 05/16/2021   AST 19 05/16/2021   ALT 18 05/16/2021   ALKPHOS 62 05/16/2021   BILITOT 1.5 (H) 05/16/2021   GFRNONAA >60 12/13/2020   GFRAA >60 12/04/2019    Lab Results  Component Value Date   INR 1.06 10/28/2015   LABPROT 14.0 10/28/2015     Medications: I have reviewed the patient's current medications.   Assessment/Plan: Chronic ITP-diagnosed in 2009, has not required treatment Cutaneous T-cell lymphoma 2012-initial rash at the left pectoral region treated with topical betamethasone with resolution Squamous cell carcinoma of the scalp 2013 History of tubular adenomas of the colon CAD, s/p myocardial infarction 2009, LAD stent Hypertension Hyperlipidemia Patient report of "factor VII "deficiency-no records regarding this are available in the medical record   Disposition: [name] has a history of chronic ITP.  The platelet count was stable earlier this month.  There is no indication for treating the ITP. He has a history of cutaneous T-cell lymphoma treated with topical betamethasone.  No clinical evidence of recurrent lymphoma.  He is followed by dermatology.  He reports a history of "factor VII "deficiency.  I cannot find evidence of this in the medical record.  He believes he has records regarding a factor VII evaluation.  He will bring these to the next visit.  [name] will return for an office visit and CBC  in 6 months.  He will call for bleeding.  Betsy Coder, MD  06/08/2021  2:34 PM

## 2021-06-15 DIAGNOSIS — L570 Actinic keratosis: Secondary | ICD-10-CM | POA: Diagnosis not present

## 2021-06-15 DIAGNOSIS — B356 Tinea cruris: Secondary | ICD-10-CM | POA: Diagnosis not present

## 2021-06-15 DIAGNOSIS — D2271 Melanocytic nevi of right lower limb, including hip: Secondary | ICD-10-CM | POA: Diagnosis not present

## 2021-06-15 DIAGNOSIS — D225 Melanocytic nevi of trunk: Secondary | ICD-10-CM | POA: Diagnosis not present

## 2021-06-15 DIAGNOSIS — C84A9 Cutaneous T-cell lymphoma, unspecified, extranodal and solid organ sites: Secondary | ICD-10-CM | POA: Diagnosis not present

## 2021-06-15 DIAGNOSIS — L821 Other seborrheic keratosis: Secondary | ICD-10-CM | POA: Diagnosis not present

## 2021-06-15 DIAGNOSIS — Z808 Family history of malignant neoplasm of other organs or systems: Secondary | ICD-10-CM | POA: Diagnosis not present

## 2021-06-15 DIAGNOSIS — Z85828 Personal history of other malignant neoplasm of skin: Secondary | ICD-10-CM | POA: Diagnosis not present

## 2021-06-15 DIAGNOSIS — L578 Other skin changes due to chronic exposure to nonionizing radiation: Secondary | ICD-10-CM | POA: Diagnosis not present

## 2021-06-15 DIAGNOSIS — B353 Tinea pedis: Secondary | ICD-10-CM | POA: Diagnosis not present

## 2021-07-11 ENCOUNTER — Other Ambulatory Visit: Payer: Self-pay | Admitting: Family Medicine

## 2021-07-11 NOTE — Telephone Encounter (Signed)
Medication was previously discontinued by patient request. Please advise

## 2021-07-25 DIAGNOSIS — H04123 Dry eye syndrome of bilateral lacrimal glands: Secondary | ICD-10-CM | POA: Diagnosis not present

## 2021-07-25 DIAGNOSIS — H52203 Unspecified astigmatism, bilateral: Secondary | ICD-10-CM | POA: Diagnosis not present

## 2021-07-25 DIAGNOSIS — H40013 Open angle with borderline findings, low risk, bilateral: Secondary | ICD-10-CM | POA: Diagnosis not present

## 2021-07-25 DIAGNOSIS — H35373 Puckering of macula, bilateral: Secondary | ICD-10-CM | POA: Diagnosis not present

## 2021-10-29 ENCOUNTER — Emergency Department (HOSPITAL_BASED_OUTPATIENT_CLINIC_OR_DEPARTMENT_OTHER)
Admission: EM | Admit: 2021-10-29 | Discharge: 2021-10-29 | Disposition: A | Discharge status: Home / Self Care | Payer: Medicare Other | Attending: Emergency Medicine | Admitting: Emergency Medicine

## 2021-10-29 ENCOUNTER — Other Ambulatory Visit: Payer: Self-pay

## 2021-10-29 ENCOUNTER — Emergency Department (HOSPITAL_COMMUNITY): Payer: Medicare Other

## 2021-10-29 ENCOUNTER — Encounter (HOSPITAL_BASED_OUTPATIENT_CLINIC_OR_DEPARTMENT_OTHER): Payer: Self-pay

## 2021-10-29 DIAGNOSIS — Z7982 Long term (current) use of aspirin: Secondary | ICD-10-CM | POA: Insufficient documentation

## 2021-10-29 DIAGNOSIS — R2681 Unsteadiness on feet: Secondary | ICD-10-CM | POA: Diagnosis not present

## 2021-10-29 DIAGNOSIS — R42 Dizziness and giddiness: Secondary | ICD-10-CM | POA: Diagnosis not present

## 2021-10-29 LAB — BASIC METABOLIC PANEL
Anion gap: 6 (ref 5–15)
BUN: 14 mg/dL (ref 8–23)
CO2: 30 mmol/L (ref 22–32)
Calcium: 9.4 mg/dL (ref 8.9–10.3)
Chloride: 106 mmol/L (ref 98–111)
Creatinine, Ser: 0.88 mg/dL (ref 0.61–1.24)
GFR, Estimated: >60 mL/min (ref >60)
Glucose, Bld: 108 mg/dL — ABNORMAL HIGH (ref 70–99)
Potassium: 4.2 mmol/L (ref 3.5–5.1)
Sodium: 142 mmol/L (ref 135–145)

## 2021-10-29 LAB — URINALYSIS, ROUTINE W REFLEX MICROSCOPIC
Bilirubin Urine: NEGATIVE
Glucose, UA: NEGATIVE mg/dL
Hgb urine dipstick: NEGATIVE
Ketones, ur: NEGATIVE mg/dL
Leukocytes,Ua: NEGATIVE
Nitrite: NEGATIVE
Protein, ur: NEGATIVE mg/dL
Specific Gravity, Urine: 1.009 (ref 1.005–1.030)
pH: 6.5 (ref 5.0–8.0)

## 2021-10-29 LAB — CBC
HCT: 45.2 % (ref 39.0–52.0)
Hemoglobin: 14.9 g/dL (ref 13.0–17.0)
MCH: 32.7 pg (ref 26.0–34.0)
MCHC: 33 g/dL (ref 30.0–36.0)
MCV: 99.3 fL (ref 80.0–100.0)
Platelets: 108 10*3/uL — ABNORMAL LOW (ref 150–400)
RBC: 4.55 MIL/uL (ref 4.22–5.81)
RDW: 12.4 % (ref 11.5–15.5)
WBC: 4 10*3/uL (ref 4.0–10.5)
nRBC: 0 % (ref 0.0–0.2)

## 2021-10-29 MED ORDER — MECLIZINE HCL 25 MG PO TABS
25.0000 mg | ORAL_TABLET | Freq: Once | ORAL | Status: AC
  Administered 2021-10-29: 25 mg via ORAL
  Filled 2021-10-29: qty 1

## 2021-10-29 MED ORDER — MECLIZINE HCL 25 MG PO TABS: 25.0000 mg | ORAL_TABLET | Freq: Three times a day (TID) | ORAL | 0 refills | Status: DC | PRN

## 2021-10-29 NOTE — ED Notes (Signed)
Pt ambulated to bathroom with minimal assistance.

## 2021-10-29 NOTE — ED Provider Notes (Signed)
[facility] Provider Note   CSN: [id number] Arrival date & time: 10/29/21  1251     History  Chief Complaint  Patient presents with   Dizziness    [name] is a 81 y.o. male.  Patient presents with complaint of dizziness.  Symptoms ongoing for about 10 days waxing and waning but worse this morning.  States he feels an unsteady gait.  No new weakness described.  No headache no chest pain no abdominal pain no fever no cough no vomiting or diarrhea described.       Home Medications Prior to Admission medications   Medication Sig Start Date End Date Taking? Authorizing Provider  ketoconazole 2%-triamcinolone 0.1% 1:2 cream mixture APPLY TOPICALLY TWICE A DAY 07/11/21   Vivi Barrack, MD  acetaminophen (TYLENOL) 650 MG CR tablet Take 650 mg by mouth 2 (two) times daily.    [provider]  ALPRAZolam Duanne Moron) 0.5 MG tablet Take 1 tablet (0.5 mg total) by mouth 2 (two) times daily as needed. 05/16/21   Vivi Barrack, MD  aspirin 81 MG tablet Take 81 mg by mouth daily.    [provider]  atorvastatin (LIPITOR) 20 MG tablet Take 1 tablet (20 mg total) by mouth daily. 04/04/21   Burnell Blanks, MD  carvedilol (COREG) 3.125 MG tablet Take 1 tablet (3.125 mg total) by mouth 2 (two) times daily with a meal. 04/04/21   Burnell Blanks, MD  fluticasone (FLONASE) 50 MCG/ACT nasal spray USE 1 SPRAY IN EACH NOSTRILTWICE DAILY 09/29/20   Vivi Barrack, MD  fluticasone (FLOVENT HFA) 110 MCG/ACT inhaler INHALE 2 PUFFS BY MOUTH 2 TIMES DAILY AS NEED 06/09/20   Vivi Barrack, MD  losartan (COZAAR) 50 MG tablet Take 1 tablet (50 mg total) by mouth daily. 04/04/21   Burnell Blanks, MD  nitroGLYCERIN (NITROSTAT) 0.4 MG SL tablet Place 1 tablet (0.4 mg total) under the tongue every 5 (five) minutes as needed for chest pain (for chest pain). Patient not taking: Reported on 06/08/2021 04/04/21   Burnell Blanks, MD   Omega-3 Fatty Acids (FISH OIL) 1000 MG CAPS Take 1,000 mg by mouth 2 (two) times daily.     [provider]  Saw Palmetto 450 MG CAPS Take 450 mg by mouth 2 (two) times daily.    [provider]  VITAMIN D PO Take 1,000 mg by mouth daily.    [provider]      Allergies    Other, Prozac [fluoxetine hcl], and Oxycodone    Review of Systems   Review of Systems  Constitutional:  Negative for fever.  HENT:  Negative for ear pain and sore throat.   Eyes:  Negative for pain.  Respiratory:  Negative for cough.   Cardiovascular:  Negative for chest pain.  Gastrointestinal:  Negative for abdominal pain.  Genitourinary:  Negative for flank pain.  Musculoskeletal:  Negative for back pain.  Skin:  Negative for color change and rash.  Neurological:  Negative for syncope.  All other systems reviewed and are negative.   Physical Exam Updated Vital Signs BP (!) 162/77   Pulse 72   Temp 98.1 F (36.7 C)   Resp (!) 23   Ht '5\' 5"'$  (1.651 m)   Wt 68.9 kg   SpO2 98%   BMI 25.28 kg/m  Physical Exam Constitutional:      Appearance: He is well-developed.  HENT:     Head: Normocephalic.  Nose: Nose normal.  Eyes:     Extraocular Movements: Extraocular movements intact.  Cardiovascular:     Rate and Rhythm: Normal rate.  Pulmonary:     Effort: Pulmonary effort is normal.  Skin:    Coloration: Skin is not jaundiced.  Neurological:     Mental Status: He is alert.     Comments: Moderately unsteady gait.  Otherwise cranial nerves II through XII intact strength 5/5 all extremities finger-nose intact heel-to-shin is intact.     ED Results / Procedures / Treatments   Labs (all labs ordered are listed, but only abnormal results are displayed) Labs Reviewed  BASIC METABOLIC PANEL - Abnormal; Notable for the following components:      Result Value   Glucose, Bld 108 (*)    All other components within normal limits  CBC - Abnormal; Notable for the following  components:   Platelets 108 (*)    All other components within normal limits  URINALYSIS, ROUTINE W REFLEX MICROSCOPIC - Abnormal; Notable for the following components:   Color, Urine COLORLESS (*)    All other components within normal limits    EKG EKG Interpretation  Date/Time:  Sunday October 29 2021 13:11:25 EDT Ventricular Rate:  62 PR Interval:  156 QRS Duration: 78 QT Interval:  390 QTC Calculation: 395 R Axis:   58 Text Interpretation: Normal sinus rhythm Normal ECG When compared with ECG of 29-Jun-2007 05:08, Criteria for Anterior infarct are no longer Present T wave inversion no longer evident in Anterolateral leads QT has shortened Confirmed by Thamas Jaegers ([id number]) on 10/29/2021 1:21:58 PM  Radiology No results found.  Procedures Procedures    Medications Ordered in ED Medications  meclizine (ANTIVERT) tablet 25 mg (has no administration in time range)    ED Course/ Medical Decision Making/ A&P                           Medical Decision Making Amount and/or Complexity of Data Reviewed Labs: ordered.   Review of record shows office visit July 25, 2021 for open ankle syndrome of the eyes.  Cardiac monitoring showing sinus rhythm.  Diagnostic studies included labs chemistry white count which were unremarkable urinalysis negative.  Patient with persistent symptoms, concern for vertigo versus CVA versus other.  MRI of the brain pursued.  Patient transferred to [facility] for MRI.        Final Clinical Impression(s) / ED Diagnoses Final diagnoses:  Unsteady gait    Rx / DC Orders ED Discharge Orders     None         Luna Fuse, MD 10/29/21 1408

## 2021-10-29 NOTE — ED Provider Notes (Signed)
  Physical Exam  BP (!) 160/78 (BP Location: Right Arm)   Pulse (!) 57   Temp 97.8 F (36.6 C) (Oral)   Resp 16   Ht '5\' 5"'$  (1.651 m)   Wt 68.9 kg   SpO2 100%   BMI 25.28 kg/m   Physical Exam  Procedures  Procedures  ED Course / MDM   Clinical Course as of 10/29/21 2040  Sun Oct 29, 2021  2013 MR BRAIN WO CONTRAST [MC]    Clinical Course User Index [MC] Barbera Setters, Student-PA   Medical Decision Making Patient was transferred from drop bridge for MRI brain for dizziness.  Patient is ambulatory at bedside and has no focal neuro deficits. We will follow-up MRI  8:41 PM MRI showed no stroke.  Patient is under a lot of stress recently.  Stable for discharge I think likely peripheral vertigo  Problems Addressed: Vertigo: acute illness or injury  Amount and/or Complexity of Data Reviewed Labs: ordered. Decision-making details documented in ED Course. Radiology: ordered and independent interpretation performed. Decision-making details documented in ED Course.  Risk Prescription drug management.          Drenda Freeze, MD 10/29/21 2041

## 2021-10-29 NOTE — ED Notes (Signed)
Patient transported to MRI 

## 2021-10-29 NOTE — Discharge Instructions (Addendum)
You have vertigo.  Take meclizine as needed for dizziness  Please avoid standing up too fast or turning your head too quickly.  See neurology for follow-up  Return to ER if you have worse dizziness, trouble walking

## 2021-10-29 NOTE — ED Notes (Signed)
[facility] at the Bedside; report given to Same.

## 2021-10-29 NOTE — ED Triage Notes (Signed)
Patient here POV from Home.  Endorses Dizziness Intermittently for approximately 1-2 Weeks but states he has been Dizzy since this AM.  History of MI in 2009. No Pain. Chronically SOB. No Fevers. No N/V/D.  NAD Noted during Triage. A&Ox4. GCS 15. Ambulatory.

## 2021-11-02 ENCOUNTER — Encounter: Payer: Self-pay | Admitting: Family Medicine

## 2021-11-02 ENCOUNTER — Ambulatory Visit (INDEPENDENT_AMBULATORY_CARE_PROVIDER_SITE_OTHER): Payer: Medicare Other | Admitting: Family Medicine

## 2021-11-02 VITALS — BP 132/70 | HR 55 | Temp 98.0°F | Ht 65.0 in | Wt 150.0 lb

## 2021-11-02 DIAGNOSIS — I2510 Atherosclerotic heart disease of native coronary artery without angina pectoris: Secondary | ICD-10-CM

## 2021-11-02 DIAGNOSIS — R42 Dizziness and giddiness: Secondary | ICD-10-CM

## 2021-11-02 NOTE — Progress Notes (Signed)
Subjective:     Patient ID: [name], male    DOB: 18-Apr-1941, 81 y.o.   MRN: [id number]  Chief Complaint  Patient presents with   Follow-up    ED follow-up for vertigo from 10/29/2021 Still having vertigo, not as bad as Sunday     HPI F/u vertigo.  Seen in ED 6/18.  Was occurring intermitt for 10 days.  Felt unsteady.  No ha, etc.    Gait observed to be unsteady in ER.  EKG non acute. Labs unremarkable x chronically low plts 108.  MRI ok  A lot of stress-wife in ECF for 5 yrs and sick lately.  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 Dealing w/a lot of paperwork stuff there.   Gets up in am and sits in bed before getting up as gets dizzy-old thing.    Feels "dizzy" top of head-not room spinning.  Then, tired and "feeling of closing eyes" and wants to go back to sleep-some days will go back to sleep.   Goes to ECF qod all day to be w/wife.  T&thurs does his chores and then just "exhausted" on Sunday-very long time his routine.   On 6/18-got up and room spinning-new.  When had MI in past-had cp,dizzy and clammy.  This time, just dizzy.  Still concerned poss heart so went to ED after spending 1/2 day w/wife.    Health Maintenance Due  Topic Date Due   COLONOSCOPY (Pts 45-78yr Insurance coverage will need to be confirmed)  09/07/2021    Past Medical History:  Diagnosis Date   Anxiety    Arthritis of multiple joints    Basal cell carcinoma of right scalp    Benign prostatic hypertrophy    CAD (coronary artery disease)    Chronic anticoagulation 02/20/2016   Chronic lower back pain, disk disease    Depression    Diverticulosis of colon    Dysrhythmia    Factor VII deficiency ([facility]    10/28/15: No history of factor VII deficiency per Dr. GBeryle Beams  History of blood transfusion 1941   History of colon polyps    Hyperlipidemia    ITP (idiopathic thrombocytopenic purpura)    Kidney stones x 2, both passed    Myocardial infarction ([facility] 2009   Seasonal allergies     Squamous carcinoma left scalp    T-cell lymphoma ([place]    "I'm in stage I; original site was on my left chest; had a place around my waist treated last summer; got a place on back of my left leg" (10/03/2012)    Past Surgical History:  Procedure Laterality Date   CARDIAC CATHETERIZATION  10/03/2012   CATARACT EXTRACTION W/ INTRAOCULAR LENS  IMPLANT, BILATERAL Bilateral 2000's   COLONOSCOPY     CORONARY ANGIOPLASTY WITH STENT PLACEMENT  05/29/2007   "1" (10/03/2012)   CYST EXCISION  1980's?   "or fatty tumor; taken off my back" (10/03/2012)   KNEE SURGERY     right   LEFT HEART CATHETERIZATION WITH CORONARY ANGIOGRAM N/A 10/03/2012   Procedure: LEFT HEART CATHETERIZATION WITH CORONARY ANGIOGRAM;  Surgeon: CBurnell Blanks MD;  [facility];  Service: Cardiovascular;  Laterality: N/A;   LUMBAR EPIDURAL INJECTION  ~ 2004   MOHS SURGERY Bilateral 2011-2012   "top of my head" (10/03/2012)   PARTIAL KNEE ARTHROPLASTY Right 11/08/2015   Procedure: RIGHT UNICOMPARTMENTAL KNEE;  Surgeon: TRenette Butters MD;  [place]  Service: Orthopedics;  Laterality: Right;  TONSILLECTOMY  ~ 1945   VASECTOMY      Outpatient Medications Prior to Visit  Medication Sig Dispense Refill   acetaminophen (TYLENOL) 650 MG CR tablet Take 650 mg by mouth 2 (two) times daily.     ALPRAZolam (XANAX) 0.5 MG tablet Take 1 tablet (0.5 mg total) by mouth 2 (two) times daily as needed. 180 tablet 1   aspirin 81 MG tablet Take 81 mg by mouth daily.     atorvastatin (LIPITOR) 20 MG tablet Take 1 tablet (20 mg total) by mouth daily. 90 tablet 3   carvedilol (COREG) 3.125 MG tablet Take 1 tablet (3.125 mg total) by mouth 2 (two) times daily with a meal. 180 tablet 3   fluticasone (FLONASE) 50 MCG/ACT nasal spray USE 1 SPRAY IN EACH NOSTRILTWICE DAILY 48 g 0   ketoconazole (NIZORAL) 2 % cream Apply topically.     ketoconazole 2%-triamcinolone 0.1% 1:2 cream mixture APPLY TOPICALLY TWICE A DAY 45 g 0    losartan (COZAAR) 50 MG tablet Take 1 tablet (50 mg total) by mouth daily. 90 tablet 3   meclizine (ANTIVERT) 25 MG tablet Take 1 tablet (25 mg total) by mouth 3 (three) times daily as needed for dizziness. 10 tablet 0   nitroGLYCERIN (NITROSTAT) 0.4 MG SL tablet Place 1 tablet (0.4 mg total) under the tongue every 5 (five) minutes as needed for chest pain (for chest pain). 75 tablet 2   Omega-3 Fatty Acids (FISH OIL) 1000 MG CAPS Take 1,000 mg by mouth 2 (two) times daily.      Saw Palmetto 450 MG CAPS Take 450 mg by mouth 2 (two) times daily.     VITAMIN D PO Take 1,000 mg by mouth daily.     fluticasone (FLOVENT HFA) 110 MCG/ACT inhaler INHALE 2 PUFFS BY MOUTH 2 TIMES DAILY AS NEED 36 each 1   No facility-administered medications prior to visit.    Allergies  Allergen Reactions   Other Other (See Comments)    ANTI-DEPRESSANTS. ANTI-DEPRESSANTS Antidepressants...has a variety of reactions to different antidepressants   Prozac [Fluoxetine Hcl] Other (See Comments)    nightmares   Oxycodone Other (See Comments)    unknown   ROS neg/noncontributory except as noted HPI/below No CP Some old SOB since having Covid No n/v      Objective:     BP 132/70   Pulse (!) 55   Temp 98 F (36.7 C) (Temporal)   Ht '5\' 5"'$  (1.651 m)   Wt 150 lb (68 kg)   SpO2 99%   BMI 24.96 kg/m  Wt Readings from Last 3 Encounters:  11/02/21 150 lb (68 kg)  10/29/21 151 lb 14.4 oz (68.9 kg)  06/08/21 151 lb 12.8 oz (68.9 kg)    Physical Exam   Gen: WDWN NAD  sit 130/80   immediately upon standing(dizzy)110/76    2 min standing 140.80 HEENT: NCAT, conjunctiva not injected, sclera nonicteric TM WNL B, OP moist, no exudates. Voice a little hoarse. NECK:  supple, no thyromegaly, no nodes, no carotid bruits CARDIAC: brady RRR, S1S2+, no murmur.  LUNGS: CTAB. No wheezes ABDOMEN:  BS+, soft, NTND, No HSM, no masses EXT:  no edema MSK: no gross abnormalities.  NEURO: A&O x3.  CN II-XII intact.   F-n,  ram,pronator drift,tandem gait ok.  PSYCH: normal mood. Good eye contact  Reviewed hosp records, Card note.   62mn w/pt, reviewing records, discussing plan    Assessment & Plan:   Problem List Items  Addressed This Visit       Cardiovascular and Mediastinum   CAD (coronary artery disease)   Other Visit Diagnoses     Dizziness    -  Primary      Dizziness-worse and some vertigo on 6/18.  F/u ED.  Vertigo better.  Continues w/some dizziness-acute on chronic.  May be some orthostasis.  Bradycardia, age, stress, poss borderline low sugars.  Pt is doing a lot for an 81yo   advised to get some rest.  Get up slowly.  Drink plenty of water.  Try bed time snack.   If worse, continues, f/u. May need carotid dopplers, med adjustments, etc.  CAD-chronic.  Stable.  Saw Card in Nov and had echo.  On BB(has bradycardia)  No orders of the defined types were placed in this encounter.   Wellington Hampshire, MD

## 2021-11-02 NOTE — Patient Instructions (Signed)
Drink plenty of fluids.  Get up slowly.   Monitor bp and pulse.   Get more rest.     Eat bed time snack.

## 2021-11-07 DIAGNOSIS — H40013 Open angle with borderline findings, low risk, bilateral: Secondary | ICD-10-CM | POA: Diagnosis not present

## 2021-11-29 ENCOUNTER — Other Ambulatory Visit: Payer: Self-pay | Admitting: *Deleted

## 2021-11-30 MED ORDER — ALPRAZOLAM 0.5 MG PO TABS: 0.5000 mg | ORAL_TABLET | Freq: Two times a day (BID) | ORAL | 1 refills | Status: DC | PRN

## 2021-12-07 ENCOUNTER — Inpatient Hospital Stay: Payer: Medicare Other | Attending: Oncology | Admitting: Oncology

## 2021-12-07 ENCOUNTER — Inpatient Hospital Stay: Payer: Medicare Other

## 2021-12-07 VITALS — BP 146/65 | HR 89 | Temp 98.2°F | Resp 18 | Ht 65.0 in | Wt 154.0 lb

## 2021-12-07 DIAGNOSIS — Z8572 Personal history of non-Hodgkin lymphomas: Secondary | ICD-10-CM | POA: Diagnosis not present

## 2021-12-07 DIAGNOSIS — D682 Hereditary deficiency of other clotting factors: Secondary | ICD-10-CM | POA: Diagnosis not present

## 2021-12-07 DIAGNOSIS — D693 Immune thrombocytopenic purpura: Secondary | ICD-10-CM | POA: Diagnosis not present

## 2021-12-07 DIAGNOSIS — I10 Essential (primary) hypertension: Secondary | ICD-10-CM | POA: Insufficient documentation

## 2021-12-07 DIAGNOSIS — I2510 Atherosclerotic heart disease of native coronary artery without angina pectoris: Secondary | ICD-10-CM | POA: Diagnosis not present

## 2021-12-07 DIAGNOSIS — Z85828 Personal history of other malignant neoplasm of skin: Secondary | ICD-10-CM | POA: Insufficient documentation

## 2021-12-07 DIAGNOSIS — E785 Hyperlipidemia, unspecified: Secondary | ICD-10-CM | POA: Diagnosis not present

## 2021-12-07 LAB — CBC WITH DIFFERENTIAL (CANCER CENTER ONLY)
Abs Immature Granulocytes: 0.01 10*3/uL (ref 0.00–0.07)
Basophils Absolute: 0 10*3/uL (ref 0.0–0.1)
Basophils Relative: 0 %
Eosinophils Absolute: 0.2 10*3/uL (ref 0.0–0.5)
Eosinophils Relative: 3 %
HCT: 44.9 % (ref 39.0–52.0)
Hemoglobin: 14.7 g/dL (ref 13.0–17.0)
Immature Granulocytes: 0 %
Lymphocytes Relative: 27 %
Lymphs Abs: 1.4 10*3/uL (ref 0.7–4.0)
MCH: 33 pg (ref 26.0–34.0)
MCHC: 32.7 g/dL (ref 30.0–36.0)
MCV: 100.9 fL — ABNORMAL HIGH (ref 80.0–100.0)
Monocytes Absolute: 0.4 10*3/uL (ref 0.1–1.0)
Monocytes Relative: 7 %
Neutro Abs: 3.3 10*3/uL (ref 1.7–7.7)
Neutrophils Relative %: 63 %
Platelet Count: 116 10*3/uL — ABNORMAL LOW (ref 150–400)
RBC: 4.45 MIL/uL (ref 4.22–5.81)
RDW: 12.8 % (ref 11.5–15.5)
WBC Count: 5.3 10*3/uL (ref 4.0–10.5)
nRBC: 0 % (ref 0.0–0.2)

## 2021-12-07 LAB — PROTIME-INR
INR: 1.1 (ref 0.8–1.2)
Prothrombin Time: 14.6 seconds (ref 11.4–15.2)

## 2021-12-07 LAB — SAVE SMEAR(SSMR), FOR PROVIDER SLIDE REVIEW

## 2021-12-07 NOTE — Progress Notes (Signed)
  [facility] OFFICE PROGRESS NOTE   Diagnosis: ITP, history of T-cell lymphoma  INTERVAL HISTORY:   [name] returns as scheduled.  He generally feels well.  He reports pruritus at the left anterior chest, but no rash.  He had an dizziness last month and was seen in the emergency room on 10/29/2021.  A brain MRI revealed no acute process.  He had an episode of bleeding at the left side of the scrotum when he woke up 1 morning within the past week.  No trauma or apparent abrasion/laceration.  The bleeding has not recurred.  No other bleeding.  Objective:  Vital signs in last 24 hours:  Blood pressure (!) 146/65, pulse 89, temperature 98.2 F (36.8 C), temperature source Oral, resp. rate 18, height '5\' 5"'$  (1.651 m), weight 154 lb (69.9 kg), SpO2 100 %.    Lymphatics: No cervical, supraclavicular, axillary, or inguinal nodes Resp: Lungs clear bilaterally Cardio: Regular rate and rhythm GI: No hepatosplenomegaly, no mass, nontender Vascular: No leg edema  Skin: No rash at the left anterior chest wall, the scrotum is unremarkable  Lab Results:  Lab Results  Component Value Date   WBC 5.3 12/07/2021   HGB 14.7 12/07/2021   HCT 44.9 12/07/2021   MCV 100.9 (H) 12/07/2021   PLT 116 (L) 12/07/2021   NEUTROABS 3.3 12/07/2021    CMP  Lab Results  Component Value Date   NA 142 10/29/2021   K 4.2 10/29/2021   CL 106 10/29/2021   CO2 30 10/29/2021   GLUCOSE 108 (H) 10/29/2021   BUN 14 10/29/2021   CREATININE 0.88 10/29/2021   CALCIUM 9.4 10/29/2021   PROT 5.8 (L) 05/16/2021   ALBUMIN 3.9 05/16/2021   AST 19 05/16/2021   ALT 18 05/16/2021   ALKPHOS 62 05/16/2021   BILITOT 1.5 (H) 05/16/2021   GFRNONAA >60 10/29/2021   GFRAA >60 12/04/2019    Medications: I have reviewed the patient's current medications.   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  He has stable mild thrombocytopenia.  There is no clinical evidence for recurrence of the cutaneous T-cell lymphoma.  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  We cannot locate a factor VII level in the electronic medical record.  We obtained a factor VII level today.  He will return for an office and lab visit in 6 months.  He will call in the interim for new symptoms.  Betsy Coder, MD  12/07/2021  4:27 PM

## 2021-12-08 ENCOUNTER — Telehealth: Payer: Self-pay

## 2021-12-08 LAB — FACTOR 7 ASSAY: Factor VII Activity: 88 % (ref 51–186)

## 2021-12-08 NOTE — Telephone Encounter (Signed)
-----   Message from Ladell Pier, MD sent at 12/08/2021  1:30 PM EDT ----- Please call patient, factor VII activity is normal

## 2021-12-08 NOTE — Telephone Encounter (Signed)
Patient gave verbal understanding and had no further questions or concerns  

## 2021-12-21 DIAGNOSIS — L570 Actinic keratosis: Secondary | ICD-10-CM | POA: Diagnosis not present

## 2021-12-21 DIAGNOSIS — D485 Neoplasm of uncertain behavior of skin: Secondary | ICD-10-CM | POA: Diagnosis not present

## 2022-02-14 ENCOUNTER — Telehealth: Payer: Self-pay | Admitting: Family Medicine

## 2022-02-14 NOTE — Telephone Encounter (Signed)
Pt returned a call to the office and is asking for a call back.

## 2022-02-14 NOTE — Telephone Encounter (Signed)
Noted  

## 2022-02-16 ENCOUNTER — Telehealth: Payer: Medicare Other | Admitting: Family Medicine

## 2022-04-09 ENCOUNTER — Ambulatory Visit: Payer: Medicare Other | Attending: Cardiovascular Disease | Admitting: Cardiovascular Disease

## 2022-04-09 ENCOUNTER — Encounter: Payer: Self-pay | Admitting: Cardiovascular Disease

## 2022-04-09 VITALS — BP 124/70 | HR 61 | Ht 65.0 in | Wt 158.4 lb

## 2022-04-09 DIAGNOSIS — I2510 Atherosclerotic heart disease of native coronary artery without angina pectoris: Secondary | ICD-10-CM | POA: Diagnosis not present

## 2022-04-09 DIAGNOSIS — I351 Nonrheumatic aortic (valve) insufficiency: Secondary | ICD-10-CM | POA: Insufficient documentation

## 2022-04-09 DIAGNOSIS — E7800 Pure hypercholesterolemia, unspecified: Secondary | ICD-10-CM | POA: Diagnosis not present

## 2022-04-09 DIAGNOSIS — I10 Essential (primary) hypertension: Secondary | ICD-10-CM | POA: Insufficient documentation

## 2022-04-09 MED ORDER — ATORVASTATIN CALCIUM 20 MG PO TABS: 20.0000 mg | ORAL_TABLET | Freq: Every day | ORAL | 3 refills | Status: DC

## 2022-04-09 MED ORDER — CARVEDILOL 3.125 MG PO TABS: 3.1250 mg | ORAL_TABLET | Freq: Two times a day (BID) | ORAL | 3 refills | Status: DC

## 2022-04-09 MED ORDER — NITROGLYCERIN 0.4 MG SL SUBL
0.4000 mg | SUBLINGUAL_TABLET | SUBLINGUAL | 3 refills | Status: DC | PRN
Start: 2022-04-09 — End: 2023-03-25

## 2022-04-09 MED ORDER — LOSARTAN POTASSIUM 50 MG PO TABS: 50.0000 mg | ORAL_TABLET | Freq: Every day | ORAL | 3 refills | Status: DC

## 2022-04-09 NOTE — Patient Instructions (Signed)
Medication Instructions:  Your physician recommends that you continue on your current medications as directed. Please refer to the Current Medication list given to you today.  *If you need a refill on your cardiac medications before your next appointment, please call your pharmacy*   Lab Work: None ordered   If you have labs (blood work) drawn today and your tests are completely normal, you will receive your results only by: March ARB (if you have MyChart) OR A paper copy in the mail If you have any lab test that is abnormal or we need to change your treatment, we will call you to review the results.   Testing/Procedures: None ordered    Follow-Up: At [facility], you and your health needs are our priority.  As part of our continuing mission to provide you with exceptional heart care, we have created designated Provider Care Teams.  These Care Teams include your primary Cardiologist (physician) and Advanced Practice Providers (APPs -  Physician Assistants and Nurse Practitioners) who all work together to provide you with the care you need, when you need it.  We recommend signing up for the patient portal called "MyChart".  Sign up information is provided on this After Visit Summary.  MyChart is used to connect with patients for Virtual Visits (Telemedicine).  Patients are able to view lab/test results, encounter notes, upcoming appointments, etc.  Non-urgent messages can be sent to your provider as well.   To learn more about what you can do with MyChart, go to [email or web address].    Your next appointment:   12 month(s)  The format for your next appointment:   In Person  Provider:   Dr. Lauree Chandler    Other Instructions   Important Information About Sugar

## 2022-04-09 NOTE — Progress Notes (Signed)
Chief Complaint  Patient presents with   Follow-up    CAD    History of Present Illness: 81 yo male with history of CAD, HTN, hyperlipidemia, idiopathic thrombocytopenia and OSA here today for cardiac follow up. He had an anterior STEMI in January 2009 and had a drug eluting stent placed in the LAD. His ejection fraction improved from 25% to 40-50% following the first event. No evidence of AAA on abdominal aortic u/s December 2013. He has since been diagnosed with T-cell lymphoma but this is in remission. Nuclear stress test 2014 with scar in the anterior wall, apex and inferoapical wall with LVEF of 55%. No ischemic EKG changes. Cardiac cath May 2014 with mild to moderate CAD, no obstructive lesions. He has chronic left arm weakness and aching. No change with exertion. Echo January 2023 with LVEF=50-55%. Trivial AI, MR.   He is here today for follow up. The patient denies any chest pain, dyspnea, palpitations, lower extremity edema, orthopnea, PND, dizziness, near syncope or syncope. He has no energy.   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 Beryle Beams   History of blood transfusion 1941   History of colon polyps    Hyperlipidemia    ITP (idiopathic thrombocytopenic purpura)    Kidney stones x 2, both passed    Myocardial infarction ([place]) 2009   Seasonal allergies    Squamous carcinoma left scalp    T-cell lymphoma ([place])    "I'm in stage I; original site was on my left chest; had a place around my waist treated last summer; got a place on back of my left leg" (10/03/2012)     Past Surgical History:  Procedure Laterality Date   CARDIAC CATHETERIZATION  10/03/2012   CATARACT EXTRACTION W/ INTRAOCULAR LENS  IMPLANT, BILATERAL Bilateral 2000's   COLONOSCOPY     CORONARY ANGIOPLASTY WITH STENT PLACEMENT  05/29/2007   "1" (10/03/2012)   CYST EXCISION  1980's?   "or fatty tumor; taken off my back" (10/03/2012)   KNEE SURGERY     right   LEFT HEART CATHETERIZATION WITH CORONARY ANGIOGRAM N/A 10/03/2012   Procedure: LEFT HEART CATHETERIZATION WITH CORONARY ANGIOGRAM;  Surgeon: Burnell Blanks, MD;  Location: [facility] CATH LAB;  Service: Cardiovascular;  Laterality: N/A;   LUMBAR EPIDURAL INJECTION  ~ 2004   MOHS SURGERY Bilateral 2011-2012   "top of my head" (10/03/2012)   PARTIAL KNEE ARTHROPLASTY Right 11/08/2015   Procedure: RIGHT UNICOMPARTMENTAL KNEE;  Surgeon: Renette Butters, MD;  Location: [facility];  Service: Orthopedics;  Laterality: Right;   TONSILLECTOMY  ~ 1945   VASECTOMY      Current Outpatient Medications  Medication Sig Dispense Refill   acetaminophen (TYLENOL) 650 MG CR tablet Take 650 mg by mouth 2 (two) times daily.     ALPRAZolam (XANAX) 0.5 MG tablet Take 1 tablet (0.5 mg total) by mouth 2 (two) times daily as needed. 180 tablet 1   aspirin 81 MG tablet Take 81 mg by mouth daily.  atorvastatin (LIPITOR) 20 MG tablet Take 1 tablet (20 mg total) by mouth daily. 90 tablet 3   carvedilol (COREG) 3.125 MG tablet Take 1 tablet (3.125 mg total) by mouth 2 (two) times daily with a meal. 180 tablet 3   fluticasone (FLONASE) 50 MCG/ACT nasal spray USE 1 SPRAY IN EACH NOSTRILTWICE DAILY 48 g 0   ketoconazole (NIZORAL) 2 % cream Apply topically.     ketoconazole 2%-triamcinolone 0.1% 1:2 cream mixture APPLY TOPICALLY TWICE A DAY 45 g 0   losartan (COZAAR) 50 MG tablet Take 1 tablet (50 mg total) by mouth daily. 90 tablet 3   nitroGLYCERIN (NITROSTAT) 0.4 MG SL tablet Place 1 tablet (0.4 mg total) under the tongue every 5 (five) minutes as needed for  chest pain (for chest pain). 75 tablet 2   Omega-3 Fatty Acids (FISH OIL) 1000 MG CAPS Take 1,000 mg by mouth 2 (two) times daily.      Saw Palmetto 450 MG CAPS Take 450 mg by mouth 2 (two) times daily.     VITAMIN D PO Take 1,000 mg by mouth daily.     No current facility-administered medications for this visit.    Allergies  Allergen Reactions   Other Other (See Comments)    ANTI-DEPRESSANTS. ANTI-DEPRESSANTS Antidepressants...has a variety of reactions to different antidepressants   Prozac [Fluoxetine Hcl] Other (See Comments)    nightmares   Oxycodone Other (See Comments)    unknown    Social History   Socioeconomic History   Marital status: Married    Spouse name: Not on file   Number of children: 2   Years of education: Not on file   Highest education level: Not on file  Occupational History   Occupation: Retired    Comment: [employer]   Tobacco Use   Smoking status: Never   Smokeless tobacco: Never  Vaping Use   Vaping Use: Never used  Substance and Sexual Activity   Alcohol use: No    Alcohol/week: 0.0 standard drinks of alcohol   Drug use: No   Sexual activity: Not Currently  Other Topics Concern   Not on file  Social History Narrative   Wife is in a skilled facility- dementia    2 children- 1 locally and 1 in [place]: golfing    Social Determinants of Health   Financial Resource Strain: Not on file  Food Insecurity: Not on file  Transportation Needs: Not on file  Physical Activity: Not on file  Stress: Not on file  Social Connections: Not on file  Intimate Partner Violence: Not on file    Family History  Problem Relation Age of Onset   Alzheimer's disease Mother    Lymphoma Father    Coronary artery disease Brother    COPD Brother    Diabetes Brother    Colon cancer Paternal Uncle    Diabetes Brother     Review of Systems:  As stated in the HPI and otherwise negative.   BP 124/70   Pulse 61   Ht '5\' 5"'$  (1.651 m)   Wt  158 lb 6.4 oz (71.8 kg)   SpO2 98%   BMI 26.36 kg/m   Physical Examination:  General: Well developed, well nourished, NAD  HEENT: OP clear, mucus membranes moist  SKIN: warm, dry. No rashes. Neuro: No focal deficits  Musculoskeletal: Muscle strength 5/5 all ext  Psychiatric: Mood and affect normal  Neck: No JVD, no carotid bruits, no thyromegaly, no  lymphadenopathy.  Lungs:Clear bilaterally, no wheezes, rhonci, crackles Cardiovascular: Regular rate and rhythm. No murmurs, gallops or rubs. Abdomen:Soft. Bowel sounds present. Non-tender.  Extremities: No lower extremity edema. Pulses are 2 + in the bilateral DP/PT.  Cardiac cath may 2014: Left main: No obstructive disease.  Left Anterior Descending Artery: Large caliber vessel that courses to the apex. The proximal vessel has mild plaque disease. The mid stented segment is patent. The small caliber diagonal branch is patent with ostial 70% stenosis as it is jailed by the stent and unchanged in appearance.  Circumflex Artery: Moderate caliber vessel with moderate caliber obtuse marginal branch with no significant disease. The AV groove Circumflex has a 40% stenosis just after the takeoff of the OM branch, unchanged.  Right Coronary Artery: Large, dominant vessel with serial 20% lesions throughout the proximal vessel. The mid vessel has a 40% stenosis. The distal vessel has mild plaque disease.  Left Ventricular Angiogram: LVEF 55% with anterior and apical hypokinesis.   EKG:  EKG is not ordered today. The ekg ordered today demonstrates   Echo January 2023:  1. No apical thrombus with Definity contrast. Left ventricular ejection  fraction, by estimation, is 50 to 55%. The left ventricle has low normal  function. The left ventricle demonstrates regional wall motion  abnormalities (see scoring diagram/findings  for description). Left ventricular diastolic parameters are consistent  with Grade I diastolic dysfunction (impaired  relaxation). There is severe  akinesis of the left ventricular, apical apical segment and anteroseptal  wall.   2. Right ventricular systolic function is normal. The right ventricular  size is normal. There is normal pulmonary artery systolic pressure. The  estimated right ventricular systolic pressure is 38.7 mmHg.   3. The mitral valve is grossly normal. Trivial mitral valve  regurgitation.   4. The aortic valve is tricuspid. Aortic valve regurgitation is trivial.   5. The inferior vena cava is normal in size with <50% respiratory  variability, suggesting right atrial pressure of 8 mmHg.   Recent Labs: 05/16/2021: ALT 18; TSH 1.82 10/29/2021: BUN 14; Creatinine, Ser 0.88; Potassium 4.2; Sodium 142 12/07/2021: Hemoglobin 14.7; Platelet Count 116   Lipid Panel Lipid Panel     Component Value Date/Time   CHOL 119 05/16/2021 1045   TRIG 68.0 05/16/2021 1045   HDL 58.80 05/16/2021 1045   CHOLHDL 2 05/16/2021 1045   VLDL 13.6 05/16/2021 1045   LDLCALC 46 05/16/2021 1045   LDLDIRECT 137.3 08/08/2006 1059   Wt Readings from Last 3 Encounters:  04/09/22 158 lb 6.4 oz (71.8 kg)  12/07/21 154 lb (69.9 kg)  11/02/21 150 lb (68 kg)    Assessment and Plan:   1. CAD without angina: He does not have chest pain. Will continue ASA, beta blocker and statin.    2. HTN: BP is controlled. Continue current medications  3. Hyperlipidemia: LDL at goal in January 2023. Continue statin  4. Aortic insufficiency: Mild by echo in 2023.   Labs/ tests ordered today include:  No orders of the defined types were placed in this encounter.  Disposition:   F/U with me in 12 months  Signed, Lauree Chandler, MD 04/09/2022 10:59 AM    [address], [place],   [address] Phone: [phone]; Fax: [phone]

## 2022-04-26 ENCOUNTER — Encounter: Payer: Self-pay | Admitting: *Deleted

## 2022-05-17 ENCOUNTER — Encounter: Payer: Medicare Other | Admitting: Family Medicine

## 2022-05-29 ENCOUNTER — Encounter: Payer: Self-pay | Admitting: Family Medicine

## 2022-05-29 ENCOUNTER — Ambulatory Visit (INDEPENDENT_AMBULATORY_CARE_PROVIDER_SITE_OTHER): Payer: Medicare Other | Admitting: Family Medicine

## 2022-05-29 VITALS — BP 118/78 | HR 57 | Temp 97.5°F | Ht 65.0 in | Wt 152.2 lb

## 2022-05-29 DIAGNOSIS — D693 Immune thrombocytopenic purpura: Secondary | ICD-10-CM

## 2022-05-29 DIAGNOSIS — F331 Major depressive disorder, recurrent, moderate: Secondary | ICD-10-CM | POA: Diagnosis not present

## 2022-05-29 DIAGNOSIS — E7800 Pure hypercholesterolemia, unspecified: Secondary | ICD-10-CM

## 2022-05-29 DIAGNOSIS — F5104 Psychophysiologic insomnia: Secondary | ICD-10-CM | POA: Diagnosis not present

## 2022-05-29 DIAGNOSIS — R053 Chronic cough: Secondary | ICD-10-CM | POA: Diagnosis not present

## 2022-05-29 DIAGNOSIS — M150 Primary generalized (osteo)arthritis: Secondary | ICD-10-CM

## 2022-05-29 DIAGNOSIS — R739 Hyperglycemia, unspecified: Secondary | ICD-10-CM

## 2022-05-29 DIAGNOSIS — M159 Polyosteoarthritis, unspecified: Secondary | ICD-10-CM

## 2022-05-29 DIAGNOSIS — N400 Enlarged prostate without lower urinary tract symptoms: Secondary | ICD-10-CM | POA: Diagnosis not present

## 2022-05-29 DIAGNOSIS — J301 Allergic rhinitis due to pollen: Secondary | ICD-10-CM

## 2022-05-29 DIAGNOSIS — R351 Nocturia: Secondary | ICD-10-CM | POA: Diagnosis not present

## 2022-05-29 DIAGNOSIS — I10 Essential (primary) hypertension: Secondary | ICD-10-CM

## 2022-05-29 DIAGNOSIS — I471 Supraventricular tachycardia, unspecified: Secondary | ICD-10-CM

## 2022-05-29 LAB — TSH: TSH: 1.9 u[IU]/mL (ref 0.35–5.50)

## 2022-05-29 LAB — PSA: PSA: 1.64 ng/mL (ref 0.10–4.00)

## 2022-05-29 LAB — COMPREHENSIVE METABOLIC PANEL
ALT: 23 U/L (ref 0–53)
AST: 22 U/L (ref 0–37)
Albumin: 4.3 g/dL (ref 3.5–5.2)
Alkaline Phosphatase: 63 U/L (ref 39–117)
BUN: 18 mg/dL (ref 6–23)
CO2: 30 mEq/L (ref 19–32)
Calcium: 9.4 mg/dL (ref 8.4–10.5)
Chloride: 104 mEq/L (ref 96–112)
Creatinine, Ser: 0.96 mg/dL (ref 0.40–1.50)
GFR: 73.03 mL/min (ref >60.00)
Glucose, Bld: 110 mg/dL — ABNORMAL HIGH (ref 70–99)
Potassium: 4.6 mEq/L (ref 3.5–5.1)
Sodium: 141 mEq/L (ref 135–145)
Total Bilirubin: 1.3 mg/dL — ABNORMAL HIGH (ref 0.2–1.2)
Total Protein: 6.2 g/dL (ref 6.0–8.3)

## 2022-05-29 LAB — LIPID PANEL
Cholesterol: 127 mg/dL (ref 0–200)
HDL: 63.4 mg/dL (ref >39.00)
LDL Cholesterol: 50 mg/dL (ref 0–99)
NonHDL: 63.24
Total CHOL/HDL Ratio: 2
Triglycerides: 64 mg/dL (ref 0.0–149.0)
VLDL: 12.8 mg/dL (ref 0.0–40.0)

## 2022-05-29 LAB — HEMOGLOBIN A1C: Hgb A1c MFr Bld: 6 % (ref 4.6–6.5)

## 2022-05-29 MED ORDER — FLUTICASONE PROPIONATE 50 MCG/ACT NA SUSP: NASAL | 0 refills | Status: DC

## 2022-05-29 NOTE — Assessment & Plan Note (Signed)
At goal on coreg 3.'125mg'$  twice daily and losartan '50mg'$  daily.

## 2022-05-29 NOTE — Patient Instructions (Signed)
It was very nice to see you today!  We will check blood work today.  We will refill your Flonase.  Your cough could be coming from your allergies.  Also could be due to your COVID infection last year.  Will check an x-Andalon today.  We may need to have you see a pulmonologist depending on results.  I will see back in year for your next yearly checkup.  Please come back sooner if needed.  Take care, Dr Jerline Pain  PLEASE NOTE:  If you had any lab tests, please let us know if you have not heard back within a few days. You may see your results on mychart before we have a chance to review them but we will give you a call once they are reviewed by [place].   If we ordered any referrals today, please let us know if you have not heard from their office within the next week.   If you had any urgent prescriptions sent in today, please check with the pharmacy within an hour of our visit to make sure the prescription was transmitted appropriately.   Please try these tips to maintain a healthy lifestyle:  Eat at least 3 REAL meals and 1-2 snacks per day.  Aim for no more than 5 hours between eating.  If you eat breakfast, please do so within one hour of getting up.   Each meal should contain half fruits/vegetables, one quarter protein, and one quarter carbs (no bigger than a computer mouse)  Cut down on sweet beverages. This includes juice, soda, and sweet tea.   Drink at least 1 glass of water with each meal and aim for at least 8 glasses per day  Exercise at least 150 minutes every week.    Preventive Care 106 Years and Older, Male Preventive care refers to lifestyle choices and visits with your health care provider that can promote health and wellness. Preventive care visits are also called wellness exams. What can I expect for my preventive care visit? Counseling During your preventive care visit, your health care provider may ask about your: Medical history, including: Past medical  problems. Family medical history. History of falls. Current health, including: Emotional well-being. Home life and relationship well-being. Sexual activity. Memory and ability to understand (cognition). Lifestyle, including: Alcohol, nicotine or tobacco, and drug use. Access to firearms. Diet, exercise, and sleep habits. Work and work Statistician. Sunscreen use. Safety issues such as seatbelt and bike helmet use. Physical exam Your health care provider will check your: Height and weight. These may be used to calculate your BMI (body mass index). BMI is a measurement that tells if you are at a healthy weight. Waist circumference. This measures the distance around your waistline. This measurement also tells if you are at a healthy weight and may help predict your risk of certain diseases, such as type 2 diabetes and high blood pressure. Heart rate and blood pressure. Body temperature. Skin for abnormal spots. What immunizations do I need?  Vaccines are usually given at various ages, according to a schedule. Your health care provider will recommend vaccines for you based on your age, medical history, and lifestyle or other factors, such as travel or where you work. What tests do I need? Screening Your health care provider may recommend screening tests for certain conditions. This may include: Lipid and cholesterol levels. Diabetes screening. This is done by checking your blood sugar (glucose) after you have not eaten for a while (fasting). Hepatitis C test. Hepatitis  B test. HIV (human immunodeficiency virus) test. STI (sexually transmitted infection) testing, if you are at risk. Lung cancer screening. Colorectal cancer screening. Prostate cancer screening. Abdominal aortic aneurysm (AAA) screening. You may need this if you are a current or former smoker. Talk with your health care provider about your test results, treatment options, and if necessary, the need for more  tests. Follow these instructions at home: Eating and drinking  Eat a diet that includes fresh fruits and vegetables, whole grains, lean protein, and low-fat dairy products. Limit your intake of foods with high amounts of sugar, saturated fats, and salt. Take vitamin and mineral supplements as recommended by your health care provider. Do not drink alcohol if your health care provider tells you not to drink. If you drink alcohol: Limit how much you have to 0-2 drinks a day. Know how much alcohol is in your drink. In the [place] one drink equals one 12 oz bottle of beer (355 mL), one 5 oz glass of wine (148 mL), or one 1 oz glass of hard liquor (44 mL). Lifestyle Brush your teeth every morning and night with fluoride toothpaste. Floss one time each day. Exercise for at least 30 minutes 5 or more days each week. Do not use any products that contain nicotine or tobacco. These products include cigarettes, chewing tobacco, and vaping devices, such as e-cigarettes. If you need help quitting, ask your health care provider. Do not use drugs. If you are sexually active, practice safe sex. Use a condom or other form of protection to prevent STIs. Take aspirin only as told by your health care provider. Make sure that you understand how much to take and what form to take. Work with your health care provider to find out whether it is safe and beneficial for you to take aspirin daily. Ask your health care provider if you need to take a cholesterol-lowering medicine (statin). Find healthy ways to manage stress, such as: Meditation, yoga, or listening to music. Journaling. Talking to a trusted person. Spending time with friends and family. Safety Always wear your seat belt while driving or riding in a vehicle. Do not drive: If you have been drinking alcohol. Do not ride with someone who has been drinking. When you are tired or distracted. While texting. If you have been using any mind-altering substances  or drugs. Wear a helmet and other protective equipment during sports activities. If you have firearms in your house, make sure you follow all gun safety procedures. Minimize exposure to UV radiation to reduce your risk of skin cancer. What's next? Visit your health care provider once a year for an annual wellness visit. Ask your health care provider how often you should have your eyes and teeth checked. Stay up to date on all vaccines. This information is not intended to replace advice given to you by your health care provider. Make sure you discuss any questions you have with your health care provider. Document Revised: 10/26/2020 Document Reviewed: 10/26/2020 Elsevier Patient Education  [place].

## 2022-05-29 NOTE — Assessment & Plan Note (Signed)
Stable.  Check PSA. 

## 2022-05-29 NOTE — Assessment & Plan Note (Signed)
Follows with cardiology. Rate controlled today.

## 2022-05-29 NOTE — Progress Notes (Signed)
[name] is a 82 y.o. male who presents today for an office visit.  Assessment/Plan:  New/Acute Problems: Cough  No red flags. May be related to seasonal allergies.  He did have COVID a year ago which could be contributing as well.  Does have some mild crackles along bibasilar areas.  No other significant abnormalities on exam.  Will check chest x-[place] today.  Depending on results of chest x-[place] and progression of symptoms may consider referral to pulmonology.   Chronic Problems Addressed Today: Hyperlipidemia On lipitor '20mg'$  daily. Check lipids today.   Essential hypertension At goal on coreg 3.'125mg'$  twice daily and losartan '50mg'$  daily.   Benign prostatic hyperplasia Stable. Check PSA.   Osteoarthritis Overall stable. Takes OTC meds for this. Discussed referral to orthopedics however he declined.   Chronic ITP (idiopathic thrombocytopenia) (HCC) Gets CBC with hematology. Follows with hematology.   SVT (supraventricular tachycardia) Follows with cardiology. Rate controlled today.   Chronic insomnia Stable on xanax as needed. Does not need refill today. Uses sparingly. No signficant side effects.   Allergic rhinitis, flonase prn Symptoms worsen recently.  Could be contributing to his above cough.  Will refill his Flonase today.  [facility] UTD on flu and covid vaccines - will obtain records. Declined prevnar 20.      Subjective:  HPI:  See A/p for status of chronic conditions. He is here today for his annual follow up.  He was last seen here about a year ago.  HE has been having a sporadic cough for about a year. Stable over that time. Happens randomly. Sometimes feels like a wheezing sensation. HE did get covid about a year ago and has had some issues since then. No shortness of breath. No chest pain. No fevers or chills.   ROS: Per HPI, otherwise a complete review of systems was negative.   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)    10/28/15: No history of factor VII deficiency per Dr. 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 Ardis Hughs 4/13 12/21/2017   Presbycusis of both ears 02/07/2016   Primary osteoarthritis of right knee 10/20/2015   SVT (supraventricular tachycardia) 11/02/2012   Chronic insomnia 08/07/2012   History of MI (myocardial  infarction) 07/24/2011   History of SCC (squamous cell carcinoma) of skin 07/24/2011   MDD (major depressive disorder) 07/24/2011   Chronic ITP (idiopathic thrombocytopenia) ([place]) 06/14/2011   History of kidney stones 06/14/2011   CAD (coronary artery disease) 03/21/2011   Obstructive sleep apnea 09/14/2008   Cardiomyopathy, ischemic 05/16/2008   Essential hypertension 07/10/2007   Hyperlipidemia 11/18/2006   Allergic rhinitis, flonase prn 11/18/2006   Diverticulosis of colon, last colonoscopy 4/18 11/18/2006   Benign  prostatic hyperplasia 11/18/2006   Osteoarthritis 11/18/2006   Past Surgical History:  Procedure Laterality Date   CARDIAC CATHETERIZATION  10/03/2012   CATARACT EXTRACTION W/ INTRAOCULAR LENS  IMPLANT, BILATERAL Bilateral 2000's   COLONOSCOPY     CORONARY ANGIOPLASTY WITH STENT PLACEMENT  05/29/2007   "1" (10/03/2012)   CYST EXCISION  1980's?   "or fatty tumor; taken off my back" (10/03/2012)   KNEE SURGERY     right   LEFT HEART CATHETERIZATION WITH CORONARY ANGIOGRAM N/A 10/03/2012   Procedure: LEFT HEART CATHETERIZATION WITH CORONARY ANGIOGRAM;  Surgeon: Burnell Blanks, MD;  Location: [facility] CATH LAB;  Service: Cardiovascular;  Laterality: N/A;   LUMBAR EPIDURAL INJECTION  ~ 2004   MOHS SURGERY Bilateral 2011-2012   "top of my head" (10/03/2012)   PARTIAL KNEE ARTHROPLASTY Right 11/08/2015   Procedure: RIGHT UNICOMPARTMENTAL KNEE;  Surgeon: Renette Butters, MD;  Location: [facility];  Service: Orthopedics;  Laterality: Right;   TONSILLECTOMY  ~ 1945   VASECTOMY      Family History  Problem Relation Age of Onset   Alzheimer's disease Mother    Lymphoma Father    Coronary artery disease Brother    COPD Brother    Diabetes Brother    Colon cancer Paternal Uncle    Diabetes Brother     Medications- reviewed and updated Current Outpatient Medications  Medication Sig Dispense Refill   acetaminophen (TYLENOL) 650 MG CR tablet Take 650 mg by mouth 2 (two) times daily.     ALPRAZolam (XANAX) 0.5 MG tablet Take 1 tablet (0.5 mg total) by mouth 2 (two) times daily as needed. 180 tablet 1   aspirin 81 MG tablet Take 81 mg by mouth daily.     atorvastatin (LIPITOR) 20 MG tablet Take 1 tablet (20 mg total) by mouth daily. 90 tablet 3   carvedilol (COREG) 3.125 MG tablet Take 1 tablet (3.125 mg total) by mouth 2 (two) times daily with a meal. 180 tablet 3   losartan (COZAAR) 50 MG tablet Take 1 tablet (50 mg total) by mouth daily. 90 tablet 3   nitroGLYCERIN (NITROSTAT) 0.4 MG SL tablet  Place 1 tablet (0.4 mg total) under the tongue every 5 (five) minutes as needed for chest pain (for chest pain). 25 tablet 3   Omega-3 Fatty Acids (FISH OIL) 1000 MG CAPS Take 1,000 mg by mouth 2 (two) times daily.      Saw Palmetto 450 MG CAPS Take 450 mg by mouth 2 (two) times daily.     VITAMIN D PO Take 1,000 mg by mouth daily.     fluticasone (FLONASE) 50 MCG/ACT nasal spray USE 1 SPRAY IN EACH NOSTRILTWICE DAILY 48 g 0   No current facility-administered medications for this visit.    Allergies-reviewed and updated Allergies  Allergen Reactions   Other Other (See Comments)    ANTI-DEPRESSANTS. ANTI-DEPRESSANTS Antidepressants...has a variety of reactions to different antidepressants   Prozac [Fluoxetine Hcl] Other (See Comments)    nightmares   Oxycodone Other (  See Comments)    unknown    Social History   Socioeconomic History   Marital status: Married    Spouse name: Not on file   Number of children: 2   Years of education: Not on file   Highest education level: Not on file  Occupational History   Occupation: Retired    Comment: [employer]   Tobacco Use   Smoking status: Never   Smokeless tobacco: Never  Vaping Use   Vaping Use: Never used  Substance and Sexual Activity   Alcohol use: No    Alcohol/week: 0.0 standard drinks of alcohol   Drug use: No   Sexual activity: Not Currently  Other Topics Concern   Not on file  Social History Narrative   Wife is in a skilled facility- dementia    2 children- 1 locally and 1 in [place]: golfing    Social Determinants of Health   Financial Resource Strain: Not on file  Food Insecurity: Not on file  Transportation Needs: Not on file  Physical Activity: Not on file  Stress: Not on file  Social Connections: Not on file          Objective:  Physical Exam: BP 118/78   Pulse (!) 57   Temp (!) 97.5 F (36.4 C) (Temporal)   Ht '5\' 5"'$  (1.651 m)   Wt 152 lb 3.2 oz (69 kg)   SpO2 100%   BMI 25.33  kg/m   Gen: No acute distress, resting comfortably CV: Regular rate and rhythm with no murmurs appreciated Pulm: Normal work of breathing, slight crackle noted middle left greater than right base otherwise clear.  Neuro: Grossly normal, moves all extremities Psych: Normal affect and thought content  Time Spent: 40 minutes of total time was spent on the date of the encounter performing the following actions: chart review prior to seeing the patient including visits with specialists, obtaining history, performing a medically necessary exam, counseling on the treatment plan, placing orders, and documenting in our EHR.        [name]. Jerline Pain, MD 05/29/2022 11:20 AM

## 2022-05-29 NOTE — Assessment & Plan Note (Signed)
Stable on xanax as needed. Does not need refill today. Uses sparingly. No signficant side effects.

## 2022-05-29 NOTE — Assessment & Plan Note (Signed)
Overall stable. Takes OTC meds for this. Discussed referral to orthopedics however he declined.

## 2022-05-29 NOTE — Assessment & Plan Note (Addendum)
Gets CBC with hematology. Follows with hematology.

## 2022-05-29 NOTE — Assessment & Plan Note (Signed)
Symptoms worsen recently.  Could be contributing to his above cough.  Will refill his Flonase today.

## 2022-05-29 NOTE — Assessment & Plan Note (Signed)
On lipitor '20mg'$  daily. Check lipids today.

## 2022-05-31 ENCOUNTER — Ambulatory Visit (HOSPITAL_BASED_OUTPATIENT_CLINIC_OR_DEPARTMENT_OTHER)
Admission: RE | Admit: 2022-05-31 | Discharge: 2022-05-31 | Disposition: A | Discharge status: Home / Self Care | Payer: Medicare Other | Source: Ambulatory Visit | Attending: Family Medicine | Admitting: Family Medicine

## 2022-05-31 DIAGNOSIS — R053 Chronic cough: Secondary | ICD-10-CM | POA: Insufficient documentation

## 2022-05-31 DIAGNOSIS — R059 Cough, unspecified: Secondary | ICD-10-CM | POA: Diagnosis not present

## 2022-05-31 DIAGNOSIS — K8020 Calculus of gallbladder without cholecystitis without obstruction: Secondary | ICD-10-CM | POA: Diagnosis not present

## 2022-05-31 NOTE — Progress Notes (Signed)
Please inform patient of the following:  Blood sugar borderline elevated but better than last year.  Everything else is stable.  Do not need to make any changes to treatment plan.  He should continue to work on diet and exercise and we can recheck in a year.

## 2022-06-01 NOTE — Progress Notes (Signed)
Please inform patient of the following:  [name] does not show any obvious causes for his cough.  Would like for him to let us know if not improving and we can refer to pulmonology.

## 2022-06-07 ENCOUNTER — Inpatient Hospital Stay: Payer: Medicare Other

## 2022-06-07 ENCOUNTER — Inpatient Hospital Stay: Payer: Medicare Other | Attending: Oncology | Admitting: Oncology

## 2022-06-07 VITALS — BP 145/70 | HR 60 | Temp 98.1°F | Resp 18 | Ht 65.0 in | Wt 154.8 lb

## 2022-06-07 DIAGNOSIS — Z85828 Personal history of other malignant neoplasm of skin: Secondary | ICD-10-CM | POA: Insufficient documentation

## 2022-06-07 DIAGNOSIS — D693 Immune thrombocytopenic purpura: Secondary | ICD-10-CM | POA: Insufficient documentation

## 2022-06-07 DIAGNOSIS — D7589 Other specified diseases of blood and blood-forming organs: Secondary | ICD-10-CM | POA: Insufficient documentation

## 2022-06-07 DIAGNOSIS — E785 Hyperlipidemia, unspecified: Secondary | ICD-10-CM | POA: Diagnosis not present

## 2022-06-07 DIAGNOSIS — I10 Essential (primary) hypertension: Secondary | ICD-10-CM | POA: Insufficient documentation

## 2022-06-07 DIAGNOSIS — I2510 Atherosclerotic heart disease of native coronary artery without angina pectoris: Secondary | ICD-10-CM | POA: Diagnosis not present

## 2022-06-07 DIAGNOSIS — Z8572 Personal history of non-Hodgkin lymphomas: Secondary | ICD-10-CM | POA: Insufficient documentation

## 2022-06-07 LAB — CBC WITH DIFFERENTIAL (CANCER CENTER ONLY)
Abs Immature Granulocytes: 0 10*3/uL (ref 0.00–0.07)
Basophils Absolute: 0 10*3/uL (ref 0.0–0.1)
Basophils Relative: 1 %
Eosinophils Absolute: 0.2 10*3/uL (ref 0.0–0.5)
Eosinophils Relative: 3 %
HCT: 45.5 % (ref 39.0–52.0)
Hemoglobin: 15.1 g/dL (ref 13.0–17.0)
Immature Granulocytes: 0 %
Lymphocytes Relative: 33 %
Lymphs Abs: 1.5 10*3/uL (ref 0.7–4.0)
MCH: 33.3 pg (ref 26.0–34.0)
MCHC: 33.2 g/dL (ref 30.0–36.0)
MCV: 100.2 fL — ABNORMAL HIGH (ref 80.0–100.0)
Monocytes Absolute: 0.4 10*3/uL (ref 0.1–1.0)
Monocytes Relative: 9 %
Neutro Abs: 2.5 10*3/uL (ref 1.7–7.7)
Neutrophils Relative %: 54 %
Platelet Count: 112 10*3/uL — ABNORMAL LOW (ref 150–400)
RBC: 4.54 MIL/uL (ref 4.22–5.81)
RDW: 12.5 % (ref 11.5–15.5)
WBC Count: 4.6 10*3/uL (ref 4.0–10.5)
nRBC: 0 % (ref 0.0–0.2)

## 2022-06-07 NOTE — Progress Notes (Signed)
  [facility] OFFICE PROGRESS NOTE   Diagnosis: ITP, history of lymphoma  INTERVAL HISTORY:   [name] returns as scheduled.  No fever, night sweats, or rash.  He reports anxiety from caring for his wife.  He has an intermittent cough since being diagnosed with COVID-19 in 2022.  A chest x-[facility] 05/31/2022 revealed no acute change.  Objective:  Vital signs in last 24 hours:  Blood pressure (!) 145/70, pulse 60, temperature 98.1 F (36.7 C), temperature source Oral, resp. rate 18, height '5\' 5"'$  (1.651 m), weight 154 lb 12.8 oz (70.2 kg), SpO2 98 %.   Lymphatics: No cervical, supraclavicular, axillary, or inguinal nodes Resp: Lungs with end inspiratory rales at the posterior base bilaterally, no respiratory distress Cardio: Regular rate and rhythm GI: No hepatosplenomegaly Vascular: No leg edema    Lab Results:  Lab Results  Component Value Date   WBC 4.6 06/07/2022   HGB 15.1 06/07/2022   HCT 45.5 06/07/2022   MCV 100.2 (H) 06/07/2022   PLT 112 (L) 06/07/2022   NEUTROABS 2.5 06/07/2022    CMP  Lab Results  Component Value Date   NA 141 05/29/2022   K 4.6 05/29/2022   CL 104 05/29/2022   CO2 30 05/29/2022   GLUCOSE 110 (H) 05/29/2022   BUN 18 05/29/2022   CREATININE 0.96 05/29/2022   CALCIUM 9.4 05/29/2022   PROT 6.2 05/29/2022   ALBUMIN 4.3 05/29/2022   AST 22 05/29/2022   ALT 23 05/29/2022   ALKPHOS 63 05/29/2022   BILITOT 1.3 (H) 05/29/2022   GFRNONAA >60 10/29/2021   GFRAA >60 12/04/2019    No results found for: "CEA1", "CEA", "YDX412", "CA125"  Medications: I have reviewed the patient's current medications.   Assessment/Plan: Chronic ITP-diagnosed in 2009, has not required treatment Cutaneous T-cell lymphoma 2012-initial rash at the left pectoral region treated with topical betamethasone with resolution Squamous cell carcinoma of the scalp 2013 History of tubular adenomas of the colon CAD, s/p myocardial infarction 2009, LAD  stent Hypertension Hyperlipidemia Patient report of "factor VII "deficiency-no records regarding this are available in the medical record Chronic Red cell macrocytosis     Disposition: Mr. Dalzell is in remission from the cutaneous T-cell lymphoma.  He has chronic mild thrombocytopenia.  He will return for an office visit in 9 months.  He will call in the interim as needed.  Betsy Coder, MD  06/07/2022  11:19 AM

## 2022-06-21 DIAGNOSIS — Z808 Family history of malignant neoplasm of other organs or systems: Secondary | ICD-10-CM | POA: Diagnosis not present

## 2022-06-21 DIAGNOSIS — D225 Melanocytic nevi of trunk: Secondary | ICD-10-CM | POA: Diagnosis not present

## 2022-06-21 DIAGNOSIS — D485 Neoplasm of uncertain behavior of skin: Secondary | ICD-10-CM | POA: Diagnosis not present

## 2022-06-21 DIAGNOSIS — Z85828 Personal history of other malignant neoplasm of skin: Secondary | ICD-10-CM | POA: Diagnosis not present

## 2022-06-21 DIAGNOSIS — D2271 Melanocytic nevi of right lower limb, including hip: Secondary | ICD-10-CM | POA: Diagnosis not present

## 2022-06-21 DIAGNOSIS — D044 Carcinoma in situ of skin of scalp and neck: Secondary | ICD-10-CM | POA: Diagnosis not present

## 2022-06-21 DIAGNOSIS — L821 Other seborrheic keratosis: Secondary | ICD-10-CM | POA: Diagnosis not present

## 2022-06-21 DIAGNOSIS — L570 Actinic keratosis: Secondary | ICD-10-CM | POA: Diagnosis not present

## 2022-06-21 DIAGNOSIS — L578 Other skin changes due to chronic exposure to nonionizing radiation: Secondary | ICD-10-CM | POA: Diagnosis not present

## 2022-06-28 ENCOUNTER — Telehealth: Payer: Self-pay | Admitting: Cardiovascular Disease

## 2022-06-28 DIAGNOSIS — I10 Essential (primary) hypertension: Secondary | ICD-10-CM

## 2022-06-28 NOTE — Telephone Encounter (Signed)
Pt c/o BP issue: STAT if pt c/o blurred vision, one-sided weakness or slurred speech  1. What are your last 5 BP readings?  147/83  147/82 140/72 136/73   2. Are you having any other symptoms (ex. Dizziness, headache, blurred vision, passed out)? A little lightheaded   3. What is your BP issue? Pt states that his BP has been running high and would like to speak to someone in regards to this.

## 2022-06-28 NOTE — Telephone Encounter (Signed)
Called the patient back.  The SBP is worrying him.  He went all the way back to last June where BP was 110s to 120s and skipped taking it for a few months in 2023.   Now since he started back it is elevated.    Noon today: 153/70,  HR 57 took meds around 8:30 am. He is very stressed with his wife's illness and the care she is receiving.   He checks when he is back from the nursing home at night and it remains 140s-150s.  Would like to know what Dr. Angelena Form suggests.  Has not changed his diet and tries to eat low sodium for the most part.  Pt aware we will call him back with recommendations.

## 2022-06-29 MED ORDER — LOSARTAN POTASSIUM 100 MG PO TABS: 100.0000 mg | ORAL_TABLET | Freq: Every day | ORAL | 3 refills | Status: DC

## 2022-06-29 NOTE — Telephone Encounter (Signed)
He has normal kidney function. We can increase his Cozaar to 100 mg daily, repeat BMET in one week. I would not want to increase his Coreg since his resting heart rate is in the 50s. [facility] patient and advised of increasing losartan to 100 mg daily.  He will come 07/05/22 (his request) for blood work.  Will monitor BP at home and call if has further concerns about it.

## 2022-07-03 ENCOUNTER — Ambulatory Visit: Payer: Medicare Other | Attending: Cardiovascular Disease

## 2022-07-03 DIAGNOSIS — H40013 Open angle with borderline findings, low risk, bilateral: Secondary | ICD-10-CM | POA: Diagnosis not present

## 2022-07-03 DIAGNOSIS — I10 Essential (primary) hypertension: Secondary | ICD-10-CM

## 2022-07-03 DIAGNOSIS — H04123 Dry eye syndrome of bilateral lacrimal glands: Secondary | ICD-10-CM | POA: Diagnosis not present

## 2022-07-03 DIAGNOSIS — H43813 Vitreous degeneration, bilateral: Secondary | ICD-10-CM | POA: Diagnosis not present

## 2022-07-03 DIAGNOSIS — H52203 Unspecified astigmatism, bilateral: Secondary | ICD-10-CM | POA: Diagnosis not present

## 2022-07-03 DIAGNOSIS — H35373 Puckering of macula, bilateral: Secondary | ICD-10-CM | POA: Diagnosis not present

## 2022-07-03 DIAGNOSIS — H524 Presbyopia: Secondary | ICD-10-CM | POA: Diagnosis not present

## 2022-07-03 LAB — BASIC METABOLIC PANEL
BUN/Creatinine Ratio: 19 (ref 10–24)
BUN: 16 mg/dL (ref 8–27)
CO2: 25 mmol/L (ref 20–29)
Calcium: 9.6 mg/dL (ref 8.6–10.2)
Chloride: 105 mmol/L (ref 96–106)
Creatinine, Ser: 0.84 mg/dL (ref 0.76–1.27)
Glucose: 90 mg/dL (ref 70–99)
Potassium: 4.2 mmol/L (ref 3.5–5.2)
Sodium: 142 mmol/L (ref 134–144)
eGFR: 87 mL/min/{1.73_m2} (ref >59)

## 2022-07-05 ENCOUNTER — Other Ambulatory Visit: Payer: Medicare Other

## 2022-07-09 DIAGNOSIS — D044 Carcinoma in situ of skin of scalp and neck: Secondary | ICD-10-CM | POA: Diagnosis not present

## 2022-08-09 DIAGNOSIS — Z85828 Personal history of other malignant neoplasm of skin: Secondary | ICD-10-CM | POA: Diagnosis not present

## 2022-08-09 DIAGNOSIS — Z5189 Encounter for other specified aftercare: Secondary | ICD-10-CM | POA: Diagnosis not present

## 2022-10-04 DIAGNOSIS — H9313 Tinnitus, bilateral: Secondary | ICD-10-CM | POA: Diagnosis not present

## 2022-10-04 DIAGNOSIS — H903 Sensorineural hearing loss, bilateral: Secondary | ICD-10-CM | POA: Diagnosis not present

## 2022-10-09 ENCOUNTER — Telehealth: Payer: Self-pay | Admitting: Cardiovascular Disease

## 2022-10-09 NOTE — Telephone Encounter (Signed)
Spoke with the patient who states that his losartan was increased to 100 mg daily back in February. He was using his 50 mg tablets and taking two of those every morning. He has since picked up his prescription for 100 mg tablets. He started these last week and states that he did not feel good. He was lightheaded and had a headache. He tried it for several days up until this past Saturday, he decided to cut his tablets and take 75 mg. He states that he has been feeling better doing this. Blood pressure has been 108/69 and 112/70. He would like to know if he should drop back down to 50 mg daily. He states that he is less stressed than he was back in February when his dose was increased.

## 2022-10-09 NOTE — Telephone Encounter (Signed)
Pt c/o medication issue:  1. Name of Medication: Losartan  75 mg 2. How are you currently taking this medication (dosage and times per day)? 1 time a day in the morning  3. Are you having a reaction (difficulty breathing--STAT)?   4. What is your medication issue? Patient wants to know  if he needs to continue taking the 75 mg? When h e was taking the 100 mg, he was  having lightheaded, headache, pressure in his head.

## 2022-10-10 NOTE — Telephone Encounter (Signed)
Spoke to the patient explained Dr. Clifton James recommendation:  He can go back to 50 mg daily and follow his BP. Thanks, chris   Patient voiced understanding.

## 2022-10-16 MED ORDER — LOSARTAN POTASSIUM 50 MG PO TABS: 50.0000 mg | ORAL_TABLET | Freq: Every day | ORAL | 3 refills | Status: DC

## 2022-10-16 NOTE — Telephone Encounter (Signed)
Updated medication list

## 2022-10-16 NOTE — Addendum Note (Signed)
Addended by: Lendon Ka on: 10/16/2022 05:00 PM   Modules accepted: Orders

## 2022-10-29 DIAGNOSIS — M25562 Pain in left knee: Secondary | ICD-10-CM | POA: Diagnosis not present

## 2022-10-29 DIAGNOSIS — M25551 Pain in right hip: Secondary | ICD-10-CM | POA: Diagnosis not present

## 2022-11-02 ENCOUNTER — Ambulatory Visit (INDEPENDENT_AMBULATORY_CARE_PROVIDER_SITE_OTHER): Payer: Medicare Other | Admitting: Family

## 2022-11-02 VITALS — BP 138/82 | HR 63 | Temp 97.8°F | Ht 65.0 in | Wt 150.1 lb

## 2022-11-02 DIAGNOSIS — B9789 Other viral agents as the cause of diseases classified elsewhere: Secondary | ICD-10-CM

## 2022-11-02 DIAGNOSIS — J329 Chronic sinusitis, unspecified: Secondary | ICD-10-CM

## 2022-11-02 NOTE — Patient Instructions (Signed)
It was very nice to see you today!   Start using your Flonase nasal spray twice a day for the next 3 days, then daily - 1 spray in each nostril. It does take 1-2 days for the Flonase to start working. This will help open up your sinuses and relieve the pressure and headache.  Ok to continue taking Tylenol 3 times a day as needed until the Flonase kicks in. Drink plenty of water.  Ok to call back next Wednesday if your are not feeling better.  Have a great weekend :-)     PLEASE NOTE:  If you had any lab tests please let us know if you have not heard back within a few days. You may see your results on MyChart before we have a chance to review them but we will give you a call once they are reviewed by [place]. If we ordered any referrals today, please let us know if you have not heard from their office within the next week.

## 2022-11-02 NOTE — Progress Notes (Signed)
Patient ID: [name], male    DOB: 17-Nov-1940, 82 y.o.   MRN: [id number]  Chief Complaint  Patient presents with   Sinus Problem    Pt c/o pressure behind eye, Nasal congestion and headaches, Present since yesterday. Refused Covid test. Has tried tylenol which help headaches.     HPI:      Sinus sx:  sx started yesterday with headache and facial pressure, nasal congestion, denies sore throat, has dry cough but has a chronic cough, no worse in last few days. Denies any fever or body aches. Pt reports he always has had to get an antibiotic in order to get over symptoms.   Assessment & Plan:  1. Viral sinusitis - reassure pt he does not an antibiotic, he has Flonase at home but has not been using as he thought it interacted with his heart medications. Showed him on EPIC interactions module that there is no interaction. Advised to use 1 spray each nostril twice a day for 3 days, then use daily until sx improved. Drink plenty of water. Can call back next week if sx are not improved and can send an antibiotic if needed.   Subjective:    Outpatient Medications Prior to Visit  Medication Sig Dispense Refill   acetaminophen (TYLENOL) 650 MG CR tablet Take 650 mg by mouth 2 (two) times daily.     ALPRAZolam (XANAX) 0.5 MG tablet Take 1 tablet (0.5 mg total) by mouth 2 (two) times daily as needed. 180 tablet 1   aspirin 81 MG tablet Take 81 mg by mouth daily.     atorvastatin (LIPITOR) 20 MG tablet Take 1 tablet (20 mg total) by mouth daily. 90 tablet 3   carvedilol (COREG) 3.125 MG tablet Take 1 tablet (3.125 mg total) by mouth 2 (two) times daily with a meal. 180 tablet 3   fluticasone (FLONASE) 50 MCG/ACT nasal spray USE 1 SPRAY IN EACH NOSTRILTWICE DAILY 48 g 0   losartan (COZAAR) 50 MG tablet Take 1 tablet (50 mg total) by mouth daily. 90 tablet 3   nitroGLYCERIN (NITROSTAT) 0.4 MG SL tablet Place 1 tablet (0.4 mg total) under the tongue every 5 (five) minutes as needed for chest pain (for  chest pain). 25 tablet 3   Omega-3 Fatty Acids (FISH OIL) 1000 MG CAPS Take 1,000 mg by mouth 2 (two) times daily.      Saw Palmetto 450 MG CAPS Take 450 mg by mouth 2 (two) times daily.     VITAMIN D PO Take 1,000 mg by mouth daily.     No facility-administered medications prior to visit.   Past Medical History:  Diagnosis Date   Anxiety    Arthritis of multiple joints    Basal cell carcinoma of right scalp    Benign prostatic hypertrophy    CAD (coronary artery disease)    Chronic anticoagulation 02/20/2016   Chronic lower back pain, disk disease    Depression    Diverticulosis of colon    Dysrhythmia    Factor VII deficiency (HCC)    10/28/15: No history of factor VII deficiency per Dr. Cyndie Chime   History of blood transfusion 1941   History of colon polyps    Hyperlipidemia    ITP (idiopathic thrombocytopenic purpura)    Kidney stones x 2, both passed    Myocardial infarction (HCC) 2009   Seasonal allergies    Squamous carcinoma left scalp    T-cell lymphoma (HCC)    "I'm in  stage I; original site was on my left chest; had a place around my waist treated last summer; got a place on back of my left leg" (10/03/2012)   Past Surgical History:  Procedure Laterality Date   CARDIAC CATHETERIZATION  10/03/2012   CATARACT EXTRACTION W/ INTRAOCULAR LENS  IMPLANT, BILATERAL Bilateral 2000's   COLONOSCOPY     CORONARY ANGIOPLASTY WITH STENT PLACEMENT  05/29/2007   "1" (10/03/2012)   CYST EXCISION  1980's?   "or fatty tumor; taken off my back" (10/03/2012)   KNEE SURGERY     right   LEFT HEART CATHETERIZATION WITH CORONARY ANGIOGRAM N/A 10/03/2012   Procedure: LEFT HEART CATHETERIZATION WITH CORONARY ANGIOGRAM;  Surgeon: Kathleene Hazel, MD;  Location: [facility] CATH LAB;  Service: Cardiovascular;  Laterality: N/A;   LUMBAR EPIDURAL INJECTION  ~ 2004   MOHS SURGERY Bilateral 2011-2012   "top of my head" (10/03/2012)   PARTIAL KNEE ARTHROPLASTY Right 11/08/2015   Procedure: RIGHT  UNICOMPARTMENTAL KNEE;  Surgeon: Sheral Apley, MD;  Location: [facility] OR;  Service: Orthopedics;  Laterality: Right;   TONSILLECTOMY  ~ 1945   VASECTOMY     Allergies  Allergen Reactions   Other Other (See Comments)    ANTI-DEPRESSANTS. ANTI-DEPRESSANTS Antidepressants...has a variety of reactions to different antidepressants   Prozac [Fluoxetine Hcl] Other (See Comments)    nightmares   Oxycodone Other (See Comments)    unknown      Objective:    Physical Exam Vitals and nursing note reviewed.  Constitutional:      General: He is not in acute distress.    Appearance: Normal appearance.  HENT:     Head: Normocephalic.     Right Ear: Tympanic membrane and ear canal normal.     Left Ear: Tympanic membrane and ear canal normal.     Nose:     Right Sinus: No maxillary sinus tenderness or frontal sinus tenderness (pressure).     Left Sinus: No maxillary sinus tenderness or frontal sinus tenderness (pressure).     Mouth/Throat:     Mouth: Mucous membranes are moist.     Pharynx: No pharyngeal swelling, oropharyngeal exudate, posterior oropharyngeal erythema or uvula swelling.     Tonsils: No tonsillar exudate or tonsillar abscesses.  Cardiovascular:     Rate and Rhythm: Normal rate and regular rhythm.  Pulmonary:     Effort: Pulmonary effort is normal.     Breath sounds: Normal breath sounds.  Musculoskeletal:        General: Normal range of motion.     Cervical back: Normal range of motion.  Lymphadenopathy:     Head:     Right side of head: No preauricular or posterior auricular adenopathy.     Left side of head: No preauricular or posterior auricular adenopathy.     Cervical: No cervical adenopathy.  Skin:    General: Skin is warm and dry.  Neurological:     Mental Status: He is alert and oriented to person, place, and time.  Psychiatric:        Mood and Affect: Mood normal.    BP 138/82 (BP Location: Left Arm, Patient Position: Sitting, Cuff Size: Normal)   Pulse  63   Temp 97.8 F (36.6 C) (Temporal)   Ht 5\' 5"  (1.651 m)   Wt 150 lb 2 oz (68.1 kg)   SpO2 97%   BMI 24.98 kg/m  Wt Readings from Last 3 Encounters:  11/02/22 150 lb 2 oz (68.1 kg)  06/07/22 154 lb 12.8 oz (70.2 kg)  05/29/22 152 lb 3.2 oz (69 kg)       Dulce Sellar, NP

## 2022-11-06 DIAGNOSIS — M25552 Pain in left hip: Secondary | ICD-10-CM | POA: Diagnosis not present

## 2022-11-06 DIAGNOSIS — M6281 Muscle weakness (generalized): Secondary | ICD-10-CM | POA: Diagnosis not present

## 2022-11-06 DIAGNOSIS — M25551 Pain in right hip: Secondary | ICD-10-CM | POA: Diagnosis not present

## 2022-11-06 DIAGNOSIS — M47896 Other spondylosis, lumbar region: Secondary | ICD-10-CM | POA: Diagnosis not present

## 2022-11-06 DIAGNOSIS — M62838 Other muscle spasm: Secondary | ICD-10-CM | POA: Diagnosis not present

## 2022-11-09 DIAGNOSIS — M47896 Other spondylosis, lumbar region: Secondary | ICD-10-CM | POA: Diagnosis not present

## 2022-11-09 DIAGNOSIS — M6281 Muscle weakness (generalized): Secondary | ICD-10-CM | POA: Diagnosis not present

## 2022-11-09 DIAGNOSIS — M25551 Pain in right hip: Secondary | ICD-10-CM | POA: Diagnosis not present

## 2022-11-09 DIAGNOSIS — M62838 Other muscle spasm: Secondary | ICD-10-CM | POA: Diagnosis not present

## 2022-11-09 DIAGNOSIS — M25552 Pain in left hip: Secondary | ICD-10-CM | POA: Diagnosis not present

## 2022-11-13 DIAGNOSIS — M25552 Pain in left hip: Secondary | ICD-10-CM | POA: Diagnosis not present

## 2022-11-13 DIAGNOSIS — M25551 Pain in right hip: Secondary | ICD-10-CM | POA: Diagnosis not present

## 2022-11-13 DIAGNOSIS — M62838 Other muscle spasm: Secondary | ICD-10-CM | POA: Diagnosis not present

## 2022-11-13 DIAGNOSIS — M47896 Other spondylosis, lumbar region: Secondary | ICD-10-CM | POA: Diagnosis not present

## 2022-11-13 DIAGNOSIS — M6281 Muscle weakness (generalized): Secondary | ICD-10-CM | POA: Diagnosis not present

## 2022-11-19 ENCOUNTER — Encounter: Payer: Self-pay | Admitting: Family Medicine

## 2022-11-19 DIAGNOSIS — H9113 Presbycusis, bilateral: Secondary | ICD-10-CM | POA: Diagnosis not present

## 2022-11-20 ENCOUNTER — Telehealth: Payer: Self-pay | Admitting: Family Medicine

## 2022-11-20 DIAGNOSIS — M62838 Other muscle spasm: Secondary | ICD-10-CM | POA: Diagnosis not present

## 2022-11-20 DIAGNOSIS — M25552 Pain in left hip: Secondary | ICD-10-CM | POA: Diagnosis not present

## 2022-11-20 DIAGNOSIS — M47896 Other spondylosis, lumbar region: Secondary | ICD-10-CM | POA: Diagnosis not present

## 2022-11-20 DIAGNOSIS — M25551 Pain in right hip: Secondary | ICD-10-CM | POA: Diagnosis not present

## 2022-11-20 DIAGNOSIS — M6281 Muscle weakness (generalized): Secondary | ICD-10-CM | POA: Diagnosis not present

## 2022-11-20 NOTE — Telephone Encounter (Signed)
Forms were received via incoming office mail and placed in provider's box.

## 2022-11-20 NOTE — Telephone Encounter (Signed)
Caller is [place] from [place]. Caller called in regards to long term care form that was faxed last week. Caller requests status of form completion. Please advise.

## 2022-11-20 NOTE — Telephone Encounter (Signed)
Noted. I hate to hear about his wife passing.   It sounds like he has a good plan in place.  He should let us know if he need any further assistance.  Katina Degree. [name], MD 11/20/2022 2:02 PM

## 2022-11-20 NOTE — Telephone Encounter (Signed)
See note

## 2022-11-22 DIAGNOSIS — M47896 Other spondylosis, lumbar region: Secondary | ICD-10-CM | POA: Diagnosis not present

## 2022-11-22 DIAGNOSIS — M25551 Pain in right hip: Secondary | ICD-10-CM | POA: Diagnosis not present

## 2022-11-22 DIAGNOSIS — M62838 Other muscle spasm: Secondary | ICD-10-CM | POA: Diagnosis not present

## 2022-11-22 DIAGNOSIS — M6281 Muscle weakness (generalized): Secondary | ICD-10-CM | POA: Diagnosis not present

## 2022-11-22 DIAGNOSIS — M25552 Pain in left hip: Secondary | ICD-10-CM | POA: Diagnosis not present

## 2022-11-26 DIAGNOSIS — M6281 Muscle weakness (generalized): Secondary | ICD-10-CM | POA: Diagnosis not present

## 2022-11-26 DIAGNOSIS — M62838 Other muscle spasm: Secondary | ICD-10-CM | POA: Diagnosis not present

## 2022-11-26 DIAGNOSIS — M47896 Other spondylosis, lumbar region: Secondary | ICD-10-CM | POA: Diagnosis not present

## 2022-11-26 DIAGNOSIS — M25552 Pain in left hip: Secondary | ICD-10-CM | POA: Diagnosis not present

## 2022-11-26 DIAGNOSIS — M25551 Pain in right hip: Secondary | ICD-10-CM | POA: Diagnosis not present

## 2022-11-26 NOTE — Telephone Encounter (Signed)
[place] called back for an update on paperwork. States he can be reached at [phone] for the updated status of paperwork.

## 2022-11-27 NOTE — Telephone Encounter (Signed)
Patient need OV for form completion

## 2022-11-28 DIAGNOSIS — M25552 Pain in left hip: Secondary | ICD-10-CM | POA: Diagnosis not present

## 2022-11-28 DIAGNOSIS — M47896 Other spondylosis, lumbar region: Secondary | ICD-10-CM | POA: Diagnosis not present

## 2022-11-28 DIAGNOSIS — M25551 Pain in right hip: Secondary | ICD-10-CM | POA: Diagnosis not present

## 2022-11-28 DIAGNOSIS — M62838 Other muscle spasm: Secondary | ICD-10-CM | POA: Diagnosis not present

## 2022-11-28 DIAGNOSIS — M6281 Muscle weakness (generalized): Secondary | ICD-10-CM | POA: Diagnosis not present

## 2022-12-04 ENCOUNTER — Encounter: Payer: Self-pay | Admitting: Family Medicine

## 2022-12-04 ENCOUNTER — Ambulatory Visit (INDEPENDENT_AMBULATORY_CARE_PROVIDER_SITE_OTHER): Payer: Medicare Other | Admitting: Family Medicine

## 2022-12-04 VITALS — BP 138/80 | HR 61 | Temp 97.6°F | Ht 65.0 in | Wt 149.4 lb

## 2022-12-04 DIAGNOSIS — F5104 Psychophysiologic insomnia: Secondary | ICD-10-CM | POA: Diagnosis not present

## 2022-12-04 DIAGNOSIS — Z634 Disappearance and death of family member: Secondary | ICD-10-CM | POA: Diagnosis not present

## 2022-12-04 DIAGNOSIS — I10 Essential (primary) hypertension: Secondary | ICD-10-CM

## 2022-12-04 DIAGNOSIS — M159 Polyosteoarthritis, unspecified: Secondary | ICD-10-CM

## 2022-12-04 DIAGNOSIS — J301 Allergic rhinitis due to pollen: Secondary | ICD-10-CM | POA: Diagnosis not present

## 2022-12-04 MED ORDER — FLUTICASONE PROPIONATE 50 MCG/ACT NA SUSP: NASAL | 0 refills | Status: DC

## 2022-12-04 MED ORDER — ALPRAZOLAM 0.5 MG PO TABS: 0.5000 mg | ORAL_TABLET | Freq: Two times a day (BID) | ORAL | 1 refills | Status: DC | PRN

## 2022-12-04 NOTE — Patient Instructions (Signed)
It was very nice to see you today!  We will complete your forms today.   I will refer you to see a counselor.  We will refill your medications.   Return if symptoms worsen or fail to improve.   Take care, Dr [name]  PLEASE NOTE:  If you had any lab tests, please let us know if you have not heard back within a few days. You may see your results on mychart before we have a chance to review them but we will give you a call once they are reviewed by [place].   If we ordered any referrals today, please let us know if you have not heard from their office within the next week.   If you had any urgent prescriptions sent in today, please check with the pharmacy within an hour of our visit to make sure the prescription was transmitted appropriately.   Please try these tips to maintain a healthy lifestyle:  Eat at least 3 REAL meals and 1-2 snacks per day.  Aim for no more than 5 hours between eating.  If you eat breakfast, please do so within one hour of getting up.   Each meal should contain half fruits/vegetables, one quarter protein, and one quarter carbs (no bigger than a computer mouse)  Cut down on sweet beverages. This includes juice, soda, and sweet tea.   Drink at least 1 glass of water with each meal and aim for at least 8 glasses per day  Exercise at least 150 minutes every week.

## 2022-12-04 NOTE — Assessment & Plan Note (Signed)
Symptoms overall stable.  Will refill Flonase today.

## 2022-12-04 NOTE — Assessment & Plan Note (Signed)
Overall stable however does significantly impact his ADLs.  He is following with orthopedics for this.  Needs assistance mostly with transferring and transportation.  Will complete long-term care form today.

## 2022-12-04 NOTE — Assessment & Plan Note (Signed)
Blood pressure at goal on Coreg 3.125 mg twice daily and losartan 50 mg daily.

## 2022-12-04 NOTE — Progress Notes (Signed)
   [name] is a 82 y.o. male who presents today for an office visit.  Assessment/Plan:  New/Acute Problems: Bereavement  Discussed patient's wife recent passing.  No SI or HI. This has been fairly difficult for him.  Does not have much support in the local community.  Was not able to schedule with hospice bereavement counseling.  Will refer to behavioral health.  Chronic Problems Addressed Today: Osteoarthritis Overall stable however does significantly impact his ADLs.  He is following with orthopedics for this.  Needs assistance mostly with transferring and transportation.  Will complete long-term care form today.  Essential hypertension Blood pressure at goal on Coreg 3.125 mg twice daily and losartan 50 mg daily.  Chronic insomnia Uses Xanax very sparingly as needed.  Will refill today.  Tolerating well without any significant side effects.  Allergic rhinitis, flonase prn Symptoms overall stable.  Will refill Flonase today.     Subjective:  HPI:  See A/P for status of chronic conditions.  Patient is here today for completion of long-term care forms.  He has had continued issues with osteoarthritis and has been following with orthopedics for this.  He has started using assistive devices to help with walking. He is starting using a in-home assistance a few times a week to help with transferring.  He does have some nocturnal incontinence as well.  He is able to feed himself and bathe himself independently.  Independent with toileting and personal hygiene.  He needs assistance with dressing himself.  His wife passed away 4 months ago after battling end stage dementia for several years.  He does have a daughter in [place] that was here when she passed however she is now back in [place].  He does have another daughter in the area but she has not offered much support.  He did look into getting bereavement counseling through hospice however they were booked out for several weeks.        Objective:  Physical Exam: BP 138/80   Pulse 61   Temp 97.6 F (36.4 C)   Ht 5\' 5"  (1.651 m)   Wt 149 lb 6.4 oz (67.8 kg)   SpO2 98%   BMI 24.86 kg/m   Gen: No acute distress, resting comfortably Neuro: Grossly normal, moves all extremities Psych: Normal affect and thought content  Time Spent: 45 minutes of total time was spent on the date of the encounter performing the following actions: chart review prior to seeing the patient, obtaining history, performing a medically necessary exam, counseling on the treatment plan, completing LTC forms, placing orders, and documenting in our EHR.        Katina Degree. [name], MD 12/04/2022 8:29 AM

## 2022-12-04 NOTE — Assessment & Plan Note (Signed)
Uses Xanax very sparingly as needed.  Will refill today.  Tolerating well without any significant side effects.

## 2022-12-05 DIAGNOSIS — M62838 Other muscle spasm: Secondary | ICD-10-CM | POA: Diagnosis not present

## 2022-12-05 DIAGNOSIS — M6281 Muscle weakness (generalized): Secondary | ICD-10-CM | POA: Diagnosis not present

## 2022-12-05 DIAGNOSIS — M25552 Pain in left hip: Secondary | ICD-10-CM | POA: Diagnosis not present

## 2022-12-05 DIAGNOSIS — M25551 Pain in right hip: Secondary | ICD-10-CM | POA: Diagnosis not present

## 2022-12-05 DIAGNOSIS — M47896 Other spondylosis, lumbar region: Secondary | ICD-10-CM | POA: Diagnosis not present

## 2022-12-10 DIAGNOSIS — M47896 Other spondylosis, lumbar region: Secondary | ICD-10-CM | POA: Diagnosis not present

## 2022-12-10 DIAGNOSIS — M25552 Pain in left hip: Secondary | ICD-10-CM | POA: Diagnosis not present

## 2022-12-10 DIAGNOSIS — M25551 Pain in right hip: Secondary | ICD-10-CM | POA: Diagnosis not present

## 2022-12-10 DIAGNOSIS — M6281 Muscle weakness (generalized): Secondary | ICD-10-CM | POA: Diagnosis not present

## 2022-12-10 DIAGNOSIS — M62838 Other muscle spasm: Secondary | ICD-10-CM | POA: Diagnosis not present

## 2022-12-10 DIAGNOSIS — M545 Low back pain, unspecified: Secondary | ICD-10-CM | POA: Diagnosis not present

## 2022-12-20 DIAGNOSIS — Z85828 Personal history of other malignant neoplasm of skin: Secondary | ICD-10-CM | POA: Diagnosis not present

## 2022-12-20 DIAGNOSIS — Z808 Family history of malignant neoplasm of other organs or systems: Secondary | ICD-10-CM | POA: Diagnosis not present

## 2022-12-20 DIAGNOSIS — L821 Other seborrheic keratosis: Secondary | ICD-10-CM | POA: Diagnosis not present

## 2022-12-20 DIAGNOSIS — D2271 Melanocytic nevi of right lower limb, including hip: Secondary | ICD-10-CM | POA: Diagnosis not present

## 2022-12-20 DIAGNOSIS — L570 Actinic keratosis: Secondary | ICD-10-CM | POA: Diagnosis not present

## 2022-12-20 DIAGNOSIS — D225 Melanocytic nevi of trunk: Secondary | ICD-10-CM | POA: Diagnosis not present

## 2022-12-20 DIAGNOSIS — L578 Other skin changes due to chronic exposure to nonionizing radiation: Secondary | ICD-10-CM | POA: Diagnosis not present

## 2022-12-20 DIAGNOSIS — D485 Neoplasm of uncertain behavior of skin: Secondary | ICD-10-CM | POA: Diagnosis not present

## 2022-12-21 DIAGNOSIS — M47896 Other spondylosis, lumbar region: Secondary | ICD-10-CM | POA: Diagnosis not present

## 2022-12-21 DIAGNOSIS — M25551 Pain in right hip: Secondary | ICD-10-CM | POA: Diagnosis not present

## 2022-12-21 DIAGNOSIS — M25552 Pain in left hip: Secondary | ICD-10-CM | POA: Diagnosis not present

## 2022-12-21 DIAGNOSIS — M6281 Muscle weakness (generalized): Secondary | ICD-10-CM | POA: Diagnosis not present

## 2022-12-21 DIAGNOSIS — M62838 Other muscle spasm: Secondary | ICD-10-CM | POA: Diagnosis not present

## 2022-12-24 DIAGNOSIS — M6281 Muscle weakness (generalized): Secondary | ICD-10-CM | POA: Diagnosis not present

## 2022-12-24 DIAGNOSIS — M62838 Other muscle spasm: Secondary | ICD-10-CM | POA: Diagnosis not present

## 2022-12-24 DIAGNOSIS — M47896 Other spondylosis, lumbar region: Secondary | ICD-10-CM | POA: Diagnosis not present

## 2022-12-24 DIAGNOSIS — M25551 Pain in right hip: Secondary | ICD-10-CM | POA: Diagnosis not present

## 2022-12-24 DIAGNOSIS — M25552 Pain in left hip: Secondary | ICD-10-CM | POA: Diagnosis not present

## 2022-12-27 DIAGNOSIS — M6281 Muscle weakness (generalized): Secondary | ICD-10-CM | POA: Diagnosis not present

## 2022-12-27 DIAGNOSIS — M25552 Pain in left hip: Secondary | ICD-10-CM | POA: Diagnosis not present

## 2022-12-27 DIAGNOSIS — M47896 Other spondylosis, lumbar region: Secondary | ICD-10-CM | POA: Diagnosis not present

## 2022-12-27 DIAGNOSIS — M25551 Pain in right hip: Secondary | ICD-10-CM | POA: Diagnosis not present

## 2022-12-27 DIAGNOSIS — M62838 Other muscle spasm: Secondary | ICD-10-CM | POA: Diagnosis not present

## 2023-01-01 DIAGNOSIS — M47896 Other spondylosis, lumbar region: Secondary | ICD-10-CM | POA: Diagnosis not present

## 2023-01-01 DIAGNOSIS — M62838 Other muscle spasm: Secondary | ICD-10-CM | POA: Diagnosis not present

## 2023-01-01 DIAGNOSIS — M25552 Pain in left hip: Secondary | ICD-10-CM | POA: Diagnosis not present

## 2023-01-01 DIAGNOSIS — M25551 Pain in right hip: Secondary | ICD-10-CM | POA: Diagnosis not present

## 2023-01-01 DIAGNOSIS — M6281 Muscle weakness (generalized): Secondary | ICD-10-CM | POA: Diagnosis not present

## 2023-01-03 DIAGNOSIS — M47896 Other spondylosis, lumbar region: Secondary | ICD-10-CM | POA: Diagnosis not present

## 2023-01-03 DIAGNOSIS — M25552 Pain in left hip: Secondary | ICD-10-CM | POA: Diagnosis not present

## 2023-01-03 DIAGNOSIS — M25551 Pain in right hip: Secondary | ICD-10-CM | POA: Diagnosis not present

## 2023-01-03 DIAGNOSIS — M6281 Muscle weakness (generalized): Secondary | ICD-10-CM | POA: Diagnosis not present

## 2023-01-03 DIAGNOSIS — M62838 Other muscle spasm: Secondary | ICD-10-CM | POA: Diagnosis not present

## 2023-01-08 DIAGNOSIS — H40013 Open angle with borderline findings, low risk, bilateral: Secondary | ICD-10-CM | POA: Diagnosis not present

## 2023-01-09 DIAGNOSIS — M62838 Other muscle spasm: Secondary | ICD-10-CM | POA: Diagnosis not present

## 2023-01-09 DIAGNOSIS — M25552 Pain in left hip: Secondary | ICD-10-CM | POA: Diagnosis not present

## 2023-01-09 DIAGNOSIS — M6281 Muscle weakness (generalized): Secondary | ICD-10-CM | POA: Diagnosis not present

## 2023-01-09 DIAGNOSIS — M47896 Other spondylosis, lumbar region: Secondary | ICD-10-CM | POA: Diagnosis not present

## 2023-01-09 DIAGNOSIS — M25551 Pain in right hip: Secondary | ICD-10-CM | POA: Diagnosis not present

## 2023-01-11 DIAGNOSIS — M62838 Other muscle spasm: Secondary | ICD-10-CM | POA: Diagnosis not present

## 2023-01-11 DIAGNOSIS — M47896 Other spondylosis, lumbar region: Secondary | ICD-10-CM | POA: Diagnosis not present

## 2023-01-11 DIAGNOSIS — M25552 Pain in left hip: Secondary | ICD-10-CM | POA: Diagnosis not present

## 2023-01-11 DIAGNOSIS — M25551 Pain in right hip: Secondary | ICD-10-CM | POA: Diagnosis not present

## 2023-01-11 DIAGNOSIS — M6281 Muscle weakness (generalized): Secondary | ICD-10-CM | POA: Diagnosis not present

## 2023-01-15 DIAGNOSIS — M6281 Muscle weakness (generalized): Secondary | ICD-10-CM | POA: Diagnosis not present

## 2023-01-15 DIAGNOSIS — M62838 Other muscle spasm: Secondary | ICD-10-CM | POA: Diagnosis not present

## 2023-01-15 DIAGNOSIS — M25551 Pain in right hip: Secondary | ICD-10-CM | POA: Diagnosis not present

## 2023-01-15 DIAGNOSIS — M47896 Other spondylosis, lumbar region: Secondary | ICD-10-CM | POA: Diagnosis not present

## 2023-01-15 DIAGNOSIS — M25552 Pain in left hip: Secondary | ICD-10-CM | POA: Diagnosis not present

## 2023-01-16 DIAGNOSIS — M47896 Other spondylosis, lumbar region: Secondary | ICD-10-CM | POA: Diagnosis not present

## 2023-01-16 DIAGNOSIS — M25551 Pain in right hip: Secondary | ICD-10-CM | POA: Diagnosis not present

## 2023-01-16 DIAGNOSIS — M25552 Pain in left hip: Secondary | ICD-10-CM | POA: Diagnosis not present

## 2023-01-16 DIAGNOSIS — M6281 Muscle weakness (generalized): Secondary | ICD-10-CM | POA: Diagnosis not present

## 2023-01-16 DIAGNOSIS — M62838 Other muscle spasm: Secondary | ICD-10-CM | POA: Diagnosis not present

## 2023-01-21 DIAGNOSIS — M25552 Pain in left hip: Secondary | ICD-10-CM | POA: Diagnosis not present

## 2023-01-21 DIAGNOSIS — M62838 Other muscle spasm: Secondary | ICD-10-CM | POA: Diagnosis not present

## 2023-01-21 DIAGNOSIS — M545 Low back pain, unspecified: Secondary | ICD-10-CM | POA: Diagnosis not present

## 2023-01-21 DIAGNOSIS — M6281 Muscle weakness (generalized): Secondary | ICD-10-CM | POA: Diagnosis not present

## 2023-01-21 DIAGNOSIS — M25551 Pain in right hip: Secondary | ICD-10-CM | POA: Diagnosis not present

## 2023-01-21 DIAGNOSIS — M47896 Other spondylosis, lumbar region: Secondary | ICD-10-CM | POA: Diagnosis not present

## 2023-01-23 DIAGNOSIS — M25551 Pain in right hip: Secondary | ICD-10-CM | POA: Diagnosis not present

## 2023-01-23 DIAGNOSIS — M62838 Other muscle spasm: Secondary | ICD-10-CM | POA: Diagnosis not present

## 2023-01-23 DIAGNOSIS — M6281 Muscle weakness (generalized): Secondary | ICD-10-CM | POA: Diagnosis not present

## 2023-01-23 DIAGNOSIS — M25552 Pain in left hip: Secondary | ICD-10-CM | POA: Diagnosis not present

## 2023-01-23 DIAGNOSIS — M47896 Other spondylosis, lumbar region: Secondary | ICD-10-CM | POA: Diagnosis not present

## 2023-01-28 DIAGNOSIS — M47896 Other spondylosis, lumbar region: Secondary | ICD-10-CM | POA: Diagnosis not present

## 2023-01-28 DIAGNOSIS — M25551 Pain in right hip: Secondary | ICD-10-CM | POA: Diagnosis not present

## 2023-01-28 DIAGNOSIS — M62838 Other muscle spasm: Secondary | ICD-10-CM | POA: Diagnosis not present

## 2023-01-28 DIAGNOSIS — M6281 Muscle weakness (generalized): Secondary | ICD-10-CM | POA: Diagnosis not present

## 2023-01-28 DIAGNOSIS — M25552 Pain in left hip: Secondary | ICD-10-CM | POA: Diagnosis not present

## 2023-01-29 DIAGNOSIS — M545 Low back pain, unspecified: Secondary | ICD-10-CM | POA: Diagnosis not present

## 2023-01-30 DIAGNOSIS — M25552 Pain in left hip: Secondary | ICD-10-CM | POA: Diagnosis not present

## 2023-01-30 DIAGNOSIS — M6281 Muscle weakness (generalized): Secondary | ICD-10-CM | POA: Diagnosis not present

## 2023-01-30 DIAGNOSIS — M47896 Other spondylosis, lumbar region: Secondary | ICD-10-CM | POA: Diagnosis not present

## 2023-01-30 DIAGNOSIS — M25551 Pain in right hip: Secondary | ICD-10-CM | POA: Diagnosis not present

## 2023-01-30 DIAGNOSIS — M62838 Other muscle spasm: Secondary | ICD-10-CM | POA: Diagnosis not present

## 2023-02-04 DIAGNOSIS — M6281 Muscle weakness (generalized): Secondary | ICD-10-CM | POA: Diagnosis not present

## 2023-02-04 DIAGNOSIS — M47896 Other spondylosis, lumbar region: Secondary | ICD-10-CM | POA: Diagnosis not present

## 2023-02-04 DIAGNOSIS — M25552 Pain in left hip: Secondary | ICD-10-CM | POA: Diagnosis not present

## 2023-02-04 DIAGNOSIS — M25551 Pain in right hip: Secondary | ICD-10-CM | POA: Diagnosis not present

## 2023-02-04 DIAGNOSIS — M62838 Other muscle spasm: Secondary | ICD-10-CM | POA: Diagnosis not present

## 2023-02-06 DIAGNOSIS — M62838 Other muscle spasm: Secondary | ICD-10-CM | POA: Diagnosis not present

## 2023-02-06 DIAGNOSIS — M25551 Pain in right hip: Secondary | ICD-10-CM | POA: Diagnosis not present

## 2023-02-06 DIAGNOSIS — M25552 Pain in left hip: Secondary | ICD-10-CM | POA: Diagnosis not present

## 2023-02-06 DIAGNOSIS — M47896 Other spondylosis, lumbar region: Secondary | ICD-10-CM | POA: Diagnosis not present

## 2023-02-06 DIAGNOSIS — M6281 Muscle weakness (generalized): Secondary | ICD-10-CM | POA: Diagnosis not present

## 2023-02-11 DIAGNOSIS — M545 Low back pain, unspecified: Secondary | ICD-10-CM | POA: Diagnosis not present

## 2023-02-15 DIAGNOSIS — Z23 Encounter for immunization: Secondary | ICD-10-CM | POA: Diagnosis not present

## 2023-03-07 ENCOUNTER — Inpatient Hospital Stay: Payer: Medicare Other | Attending: Oncology

## 2023-03-07 ENCOUNTER — Inpatient Hospital Stay: Payer: Medicare Other | Admitting: Oncology

## 2023-03-07 VITALS — BP 135/72 | HR 60 | Temp 98.1°F | Resp 18 | Ht 65.0 in | Wt 152.4 lb

## 2023-03-07 DIAGNOSIS — I252 Old myocardial infarction: Secondary | ICD-10-CM | POA: Insufficient documentation

## 2023-03-07 DIAGNOSIS — Z955 Presence of coronary angioplasty implant and graft: Secondary | ICD-10-CM | POA: Diagnosis not present

## 2023-03-07 DIAGNOSIS — Z8572 Personal history of non-Hodgkin lymphomas: Secondary | ICD-10-CM | POA: Insufficient documentation

## 2023-03-07 DIAGNOSIS — D693 Immune thrombocytopenic purpura: Secondary | ICD-10-CM

## 2023-03-07 DIAGNOSIS — E785 Hyperlipidemia, unspecified: Secondary | ICD-10-CM | POA: Diagnosis not present

## 2023-03-07 DIAGNOSIS — I10 Essential (primary) hypertension: Secondary | ICD-10-CM | POA: Diagnosis not present

## 2023-03-07 DIAGNOSIS — I2510 Atherosclerotic heart disease of native coronary artery without angina pectoris: Secondary | ICD-10-CM | POA: Diagnosis not present

## 2023-03-07 DIAGNOSIS — D7589 Other specified diseases of blood and blood-forming organs: Secondary | ICD-10-CM | POA: Insufficient documentation

## 2023-03-07 LAB — CBC WITH DIFFERENTIAL (CANCER CENTER ONLY)
Abs Immature Granulocytes: 0.01 10*3/uL (ref 0.00–0.07)
Basophils Absolute: 0 10*3/uL (ref 0.0–0.1)
Basophils Relative: 0 %
Eosinophils Absolute: 0.2 10*3/uL (ref 0.0–0.5)
Eosinophils Relative: 4 %
HCT: 45.7 % (ref 39.0–52.0)
Hemoglobin: 15.4 g/dL (ref 13.0–17.0)
Immature Granulocytes: 0 %
Lymphocytes Relative: 30 %
Lymphs Abs: 1.5 10*3/uL (ref 0.7–4.0)
MCH: 33.3 pg (ref 26.0–34.0)
MCHC: 33.7 g/dL (ref 30.0–36.0)
MCV: 98.9 fL (ref 80.0–100.0)
Monocytes Absolute: 0.5 10*3/uL (ref 0.1–1.0)
Monocytes Relative: 10 %
Neutro Abs: 2.7 10*3/uL (ref 1.7–7.7)
Neutrophils Relative %: 56 %
Platelet Count: 123 10*3/uL — ABNORMAL LOW (ref 150–400)
RBC: 4.62 MIL/uL (ref 4.22–5.81)
RDW: 12.5 % (ref 11.5–15.5)
WBC Count: 4.9 10*3/uL (ref 4.0–10.5)
nRBC: 0 % (ref 0.0–0.2)

## 2023-03-07 NOTE — Progress Notes (Signed)
  [facility] OFFICE PROGRESS NOTE   Diagnosis: ITP, lymphoma  INTERVAL HISTORY:   Mr. Vilchez returns as scheduled.  He generally feels well.  No fever or night sweats.  No palpable lymph nodes.  No suspicious skin lesions.  He is scheduled for dermatology follow-up in January.  Objective:  Vital signs in last 24 hours:  Blood pressure 135/72, pulse 60, temperature 98.1 F (36.7 C), temperature source Oral, resp. rate 18, height 5\' 5"  (1.651 m), weight 152 lb 6.4 oz (69.1 kg), SpO2 100%.    Lymphatics: No cervical, supraclavicular, axillary, or inguinal nodes Resp: Lungs clear bilaterally Cardio: Regular rate and rhythm GI: No hepatosplenomegaly Vascular: No leg edema  Skin: Multiple benign-appearing moles over the trunk and extremities.  Scars over the scalp.  Lab Results:  Lab Results  Component Value Date   WBC 4.9 03/07/2023   HGB 15.4 03/07/2023   HCT 45.7 03/07/2023   MCV 98.9 03/07/2023   PLT 123 (L) 03/07/2023   NEUTROABS 2.7 03/07/2023    CMP  Lab Results  Component Value Date   NA 142 07/03/2022   K 4.2 07/03/2022   CL 105 07/03/2022   CO2 25 07/03/2022   GLUCOSE 90 07/03/2022   BUN 16 07/03/2022   CREATININE 0.84 07/03/2022   CALCIUM 9.6 07/03/2022   PROT 6.2 05/29/2022   ALBUMIN 4.3 05/29/2022   AST 22 05/29/2022   ALT 23 05/29/2022   ALKPHOS 63 05/29/2022   BILITOT 1.3 (H) 05/29/2022   GFRNONAA >60 10/29/2021   GFRAA >60 12/04/2019     Medications: I have reviewed the patient's current medications.   Assessment/Plan: Chronic ITP-diagnosed in 2009, has not required treatment Cutaneous T-cell lymphoma 2012-initial rash at the left pectoral region treated with topical betamethasone with resolution Squamous cell carcinoma of the scalp 2013 History of tubular adenomas of the colon CAD, s/p myocardial infarction 2009, LAD stent Hypertension Hyperlipidemia Patient report of "factor VII "deficiency-no records regarding this are  available in the medical record Chronic Red cell macrocytosis     Disposition: Mr. Moxey remains in clinical remission from cutaneous T-cell lymphoma.  He has stable chronic mild thrombocytopenia.  He will return for an office and lab visit in 1 year.  He continues follow-up with dermatology.  He will call in the interim for new symptoms.  Thornton Papas, MD  03/07/2023  11:21 AM

## 2023-03-09 ENCOUNTER — Encounter: Payer: Self-pay | Admitting: Family Medicine

## 2023-03-11 NOTE — Telephone Encounter (Signed)
Please clarify what medication they are referring to.  Katina Degree. [name], MD 03/11/2023 1:37 PM

## 2023-03-11 NOTE — Telephone Encounter (Signed)
Rx not on current medication  Please advise

## 2023-03-12 NOTE — Telephone Encounter (Signed)
Patient requesting Rx ketoconazole 2%-triamcinolone 0.1% 1:2 cream mixture

## 2023-03-12 NOTE — Telephone Encounter (Signed)
Patient has Phy schedule on 05/30/2022 Ok to do labs prior to Phy?

## 2023-03-13 MED ORDER — TRIAMCINOLONE ACETONIDE 0.1 % EX CREA: TOPICAL_CREAM | Freq: Two times a day (BID) | CUTANEOUS | 1 refills | Status: DC | PRN

## 2023-03-19 ENCOUNTER — Other Ambulatory Visit: Payer: Self-pay | Admitting: Medical Genetics

## 2023-03-19 DIAGNOSIS — Z006 Encounter for examination for normal comparison and control in clinical research program: Secondary | ICD-10-CM

## 2023-03-25 ENCOUNTER — Ambulatory Visit: Payer: Medicare Other | Attending: Cardiovascular Disease | Admitting: Cardiovascular Disease

## 2023-03-25 ENCOUNTER — Encounter: Payer: Self-pay | Admitting: Cardiovascular Disease

## 2023-03-25 VITALS — BP 134/60 | HR 59 | Ht 65.0 in | Wt 152.2 lb

## 2023-03-25 DIAGNOSIS — E7800 Pure hypercholesterolemia, unspecified: Secondary | ICD-10-CM | POA: Diagnosis not present

## 2023-03-25 DIAGNOSIS — I351 Nonrheumatic aortic (valve) insufficiency: Secondary | ICD-10-CM | POA: Diagnosis not present

## 2023-03-25 DIAGNOSIS — I10 Essential (primary) hypertension: Secondary | ICD-10-CM | POA: Diagnosis not present

## 2023-03-25 DIAGNOSIS — I2510 Atherosclerotic heart disease of native coronary artery without angina pectoris: Secondary | ICD-10-CM | POA: Insufficient documentation

## 2023-03-25 MED ORDER — LOSARTAN POTASSIUM 50 MG PO TABS: 50.0000 mg | ORAL_TABLET | Freq: Every day | ORAL | 3 refills | Status: DC

## 2023-03-25 MED ORDER — ATORVASTATIN CALCIUM 20 MG PO TABS
20.0000 mg | ORAL_TABLET | Freq: Every day | ORAL | 3 refills | Status: DC
Start: 2023-03-25 — End: 2024-01-21

## 2023-03-25 MED ORDER — NITROGLYCERIN 0.4 MG SL SUBL: 0.4000 mg | SUBLINGUAL_TABLET | SUBLINGUAL | 3 refills | Status: DC | PRN

## 2023-03-25 MED ORDER — CARVEDILOL 3.125 MG PO TABS
3.1250 mg | ORAL_TABLET | Freq: Two times a day (BID) | ORAL | 3 refills | Status: DC
Start: 2023-03-25 — End: 2024-01-14

## 2023-03-25 NOTE — Progress Notes (Signed)
Chief Complaint  Patient presents with   Follow-up    CAD   History of Present Illness: 82 yo male with history of CAD, HTN, hyperlipidemia, idiopathic thrombocytopenia and sleep apnea who is here today for cardiac follow up. He had an anterior STEMI in January 2009 and had a drug eluting stent placed in the LAD. His ejection fraction improved from 25% to 40-50% following the first event. He has T-cell lymphoma but this is in remission. Cardiac cath May 2014 with mild to moderate CAD, no obstructive lesions. Echo January 2023 with LVEF=50-55%. Trivial AI, MR.   He is here today for follow up. The patient denies any chest pain, dyspnea, palpitations, lower extremity edema, orthopnea, PND, dizziness, near syncope or syncope.   Primary Care Physician: Ardith Dark, MD  Past Medical History:  Diagnosis Date   Anxiety    Arthritis of multiple joints    Basal cell carcinoma of right scalp    Benign prostatic hypertrophy    CAD (coronary artery disease)    Chronic anticoagulation 02/20/2016   Chronic lower back pain, disk disease    Depression    Diverticulosis of colon    Dysrhythmia    Factor VII deficiency (HCC)    10/28/15: No history of factor VII deficiency per Dr. Cyndie Chime   History of blood transfusion 1941   History of colon polyps    Hyperlipidemia    ITP (idiopathic thrombocytopenic purpura)    Kidney stones x 2, both passed    Myocardial infarction (HCC) 2009   Seasonal allergies    Squamous carcinoma left scalp    T-cell lymphoma (HCC)    "I'm in stage I; original site was on my left chest; had a place around my waist treated last summer; got a place on back of my left leg" (10/03/2012)    Past Surgical History:  Procedure Laterality Date   CARDIAC CATHETERIZATION  10/03/2012   CATARACT EXTRACTION W/ INTRAOCULAR LENS  IMPLANT, BILATERAL Bilateral 2000's   COLONOSCOPY     CORONARY ANGIOPLASTY WITH STENT PLACEMENT  05/29/2007   "1" (10/03/2012)   CYST EXCISION   1980's?   "or fatty tumor; taken off my back" (10/03/2012)   KNEE SURGERY     right   LEFT HEART CATHETERIZATION WITH CORONARY ANGIOGRAM N/A 10/03/2012   Procedure: LEFT HEART CATHETERIZATION WITH CORONARY ANGIOGRAM;  Surgeon: Kathleene Hazel, MD;  Location: [facility] CATH LAB;  Service: Cardiovascular;  Laterality: N/A;   LUMBAR EPIDURAL INJECTION  ~ 2004   MOHS SURGERY Bilateral 2011-2012   "top of my head" (10/03/2012)   PARTIAL KNEE ARTHROPLASTY Right 11/08/2015   Procedure: RIGHT UNICOMPARTMENTAL KNEE;  Surgeon: Sheral Apley, MD;  Location: [facility] OR;  Service: Orthopedics;  Laterality: Right;   TONSILLECTOMY  ~ 1945   VASECTOMY      Current Outpatient Medications  Medication Sig Dispense Refill   acetaminophen (TYLENOL) 650 MG CR tablet Take 650 mg by mouth 2 (two) times daily.     ALPRAZolam (XANAX) 0.5 MG tablet Take 1 tablet (0.5 mg total) by mouth 2 (two) times daily as needed. 180 tablet 1   aspirin 81 MG tablet Take 81 mg by mouth daily.     b complex vitamins capsule Take 1 capsule by mouth daily.     co-enzyme Q-10 30 MG capsule Take 20 mg by mouth daily.     fluticasone (FLONASE) 50 MCG/ACT nasal spray USE 1 SPRAY IN EACH NOSTRILTWICE DAILY 48 g 0  ketoconazole 2%-triamcinolone 0.1% 1:2 cream mixture Apply topically 2 (two) times daily as needed. 45 g 1   Omega-3 Fatty Acids (FISH OIL) 1000 MG CAPS Take 1,000 mg by mouth 2 (two) times daily.      Saw Palmetto 450 MG CAPS Take 450 mg by mouth daily.     VITAMIN D PO Take 1,000 mg by mouth daily.     atorvastatin (LIPITOR) 20 MG tablet Take 1 tablet (20 mg total) by mouth daily. 90 tablet 3   carvedilol (COREG) 3.125 MG tablet Take 1 tablet (3.125 mg total) by mouth 2 (two) times daily with a meal. 180 tablet 3   losartan (COZAAR) 50 MG tablet Take 1 tablet (50 mg total) by mouth daily. 90 tablet 3   nitroGLYCERIN (NITROSTAT) 0.4 MG SL tablet Place 1 tablet (0.4 mg total) under the tongue every 5 (five) minutes as needed for  chest pain (for chest pain). 25 tablet 3   No current facility-administered medications for this visit.    Allergies  Allergen Reactions   Other Other (See Comments)    ANTI-DEPRESSANTS. ANTI-DEPRESSANTS Antidepressants...has a variety of reactions to different antidepressants   Prozac [Fluoxetine Hcl] Other (See Comments)    nightmares   Oxycodone Other (See Comments)    unknown    Social History   Socioeconomic History   Marital status: Married    Spouse name: Not on file   Number of children: 2   Years of education: Not on file   Highest education level: Some college, no degree  Occupational History   Occupation: Retired    Comment: Airline pilot- Clinical cytogeneticist   Tobacco Use   Smoking status: Never   Smokeless tobacco: Never  Vaping Use   Vaping status: Never Used  Substance and Sexual Activity   Alcohol use: No    Alcohol/week: 0.0 standard drinks of alcohol   Drug use: No   Sexual activity: Not Currently  Other Topics Concern   Not on file  Social History Narrative   Wife is in a skilled facility- dementia    2 children- 1 locally and 1 in [place]    Hobbies: golfing    Social Determinants of Health   Financial Resource Strain: Patient Declined (12/01/2022)   Overall Financial Resource Strain (CARDIA)    Difficulty of Paying Living Expenses: Patient declined  Food Insecurity: Patient Declined (12/01/2022)   Hunger Vital Sign    Worried About Running Out of Food in the Last Year: Patient declined    Ran Out of Food in the Last Year: Patient declined  Transportation Needs: No Transportation Needs (12/01/2022)   PRAPARE - Administrator, Civil Service (Medical): No    Lack of Transportation (Non-Medical): No  Physical Activity: Unknown (12/01/2022)   Exercise Vital Sign    Days of Exercise per Week: Patient declined    Minutes of Exercise per Session: Not on file  Stress: Patient Declined (12/01/2022)   [employer] of Occupational Health - Occupational  Stress Questionnaire    Feeling of Stress : Patient declined  Social Connections: Unknown (12/01/2022)   Social Connection and Isolation Panel [NHANES]    Frequency of Communication with Friends and Family: More than three times a week    Frequency of Social Gatherings with Friends and Family: Twice a week    Attends Religious Services: 1 to 4 times per year    Active Member of [employer] or Organizations: Patient declined    Attends Banker Meetings:  Not on file    Marital Status: Widowed  Catering manager Violence: Not on file    Family History  Problem Relation Age of Onset   Alzheimer's disease Mother    Lymphoma Father    Coronary artery disease Brother    COPD Brother    Diabetes Brother    Colon cancer Paternal Uncle    Diabetes Brother     Review of Systems:  As stated in the HPI and otherwise negative.   BP 134/60   Pulse (!) 59   Ht 5\' 5"  (1.651 m)   Wt 69 kg   SpO2 99%   BMI 25.33 kg/m   Physical Examination:  General: Well developed, well nourished, NAD  HEENT: OP clear, mucus membranes moist  SKIN: warm, dry. No rashes. Neuro: No focal deficits  Musculoskeletal: Muscle strength 5/5 all ext  Psychiatric: Mood and affect normal  Neck: No JVD, no carotid bruits, no thyromegaly, no lymphadenopathy.  Lungs:Clear bilaterally, no wheezes, rhonci, crackles Cardiovascular: Regular rate and rhythm. No murmurs, gallops or rubs. Abdomen:Soft. Bowel sounds present. Non-tender.  Extremities: No lower extremity edema. Pulses are 2 + in the bilateral DP/PT.  EKG:  EKG is ordered today. The ekg ordered today demonstrates  EKG Interpretation Date/Time:  Monday March 25 2023 09:59:58 EST Ventricular Rate:  58 PR Interval:  178 QRS Duration:  80 QT Interval:  402 QTC Calculation: 394 R Axis:   23  Text Interpretation: Sinus bradycardia with sinus arrhythmia Cannot rule out Anterior infarct , age undetermined Confirmed by Verne Carrow [phone]) on  03/25/2023 10:06:33 AM    Echo January 2023:  1. No apical thrombus with Definity contrast. Left ventricular ejection  fraction, by estimation, is 50 to 55%. The left ventricle has low normal  function. The left ventricle demonstrates regional wall motion  abnormalities (see scoring diagram/findings  for description). Left ventricular diastolic parameters are consistent  with Grade I diastolic dysfunction (impaired relaxation). There is severe  akinesis of the left ventricular, apical apical segment and anteroseptal  wall.   2. Right ventricular systolic function is normal. The right ventricular  size is normal. There is normal pulmonary artery systolic pressure. The  estimated right ventricular systolic pressure is 31.2 mmHg.   3. The mitral valve is grossly normal. Trivial mitral valve  regurgitation.   4. The aortic valve is tricuspid. Aortic valve regurgitation is trivial.   5. The inferior vena cava is normal in size with <50% respiratory  variability, suggesting right atrial pressure of 8 mmHg.   Recent Labs: 05/29/2022: ALT 23; TSH 1.90 07/03/2022: BUN 16; Creatinine, Ser 0.84; Potassium 4.2; Sodium 142 03/07/2023: Hemoglobin 15.4; Platelet Count 123   Lipid Panel Lipid Panel     Component Value Date/Time   CHOL 127 05/29/2022 1114   TRIG 64.0 05/29/2022 1114   HDL 63.40 05/29/2022 1114   CHOLHDL 2 05/29/2022 1114   VLDL 12.8 05/29/2022 1114   LDLCALC 50 05/29/2022 1114   LDLDIRECT 137.3 08/08/2006 1059   Wt Readings from Last 3 Encounters:  03/25/23 69 kg  03/07/23 69.1 kg  12/04/22 67.8 kg    Assessment and Plan:   1. CAD without angina: No chest pain suggestive of angina. LV function normal by echo in 2023. Continue ASA, statin and beta blocker.     2. HTN: BP is well controlled. No changes today  3. Hyperlipidemia: LDL at goal in January 2024. Continue statin  4. Aortic insufficiency: Mild by echo in 2023. No plans  to repeat the echo at this time  Labs/  tests ordered today include:   Orders Placed This Encounter  Procedures   EKG 12-Lead   Disposition:   F/U with me in 12 months  Signed, Verne Carrow, MD 03/25/2023 11:00 AM    [facility] [address] [place], [place], [place]  [address] Phone: [phone]; Fax: [phone]

## 2023-03-25 NOTE — Patient Instructions (Signed)

## 2023-03-28 ENCOUNTER — Ambulatory Visit: Payer: Medicare Other | Admitting: Physician Assistant

## 2023-03-28 ENCOUNTER — Encounter: Payer: Self-pay | Admitting: Physician Assistant

## 2023-03-28 VITALS — BP 130/78 | HR 62 | Temp 97.3°F | Ht 65.0 in | Wt 151.0 lb

## 2023-03-28 DIAGNOSIS — R051 Acute cough: Secondary | ICD-10-CM

## 2023-03-28 MED ORDER — AMOXICILLIN-POT CLAVULANATE 875-125 MG PO TABS
1.0000 | ORAL_TABLET | Freq: Two times a day (BID) | ORAL | 0 refills | Status: DC
Start: 2023-03-28 — End: 2023-04-09

## 2023-03-28 MED ORDER — BENZONATATE 100 MG PO CAPS: 100.0000 mg | ORAL_CAPSULE | Freq: Two times a day (BID) | ORAL | 0 refills | Status: DC | PRN

## 2023-03-28 NOTE — Progress Notes (Signed)
[name] is a 82 y.o. male here for a new problem.  History of Present Illness:   Chief Complaint  Patient presents with   Cough    Pt c/o cough x 1.5 weeks, expectorating clear. Has taken Mucinex DM, & Delsym DM. Denies fever or chills.   HPI  Cough: Complains of cough with expectoration of clear mucus for 1.5 week. Reports associated sore throat and some wheezing while coughing. Endorsed sick contact with his grandson at a family gathering for Halloween.  He says the cough has not improved since its onset.  He had been taking Mucinex DM, which increased his congestion, and then switched to Delsym DM on 11/10.  States he has also been using Flonase, which he does seasonally for congestion. He states he has no history of COPD; he is unsure about history of asthma.  He has had sinus pressure in the past, but isn't having it now.  He mentions his cardiologist noted some crackling in his lungs at an appointment on 11/11.  Denies fever/chills, body aches, ear pain, loss of appetite  Past Medical History:  Diagnosis Date   Anxiety    Arthritis of multiple joints    Basal cell carcinoma of right scalp    Benign prostatic hypertrophy    CAD (coronary artery disease)    Chronic anticoagulation 02/20/2016   Chronic lower back pain, disk disease    Depression    Diverticulosis of colon    Dysrhythmia    Factor VII deficiency (HCC)    10/28/15: No history of factor VII deficiency per Dr. Cyndie Chime   History of blood transfusion 1941   History of colon polyps    Hyperlipidemia    ITP (idiopathic thrombocytopenic purpura)    Kidney stones x 2, both passed    Myocardial infarction (HCC) 2009   Seasonal allergies    Squamous carcinoma left scalp    T-cell lymphoma (HCC)    "I'm in stage I; original site was on my left chest; had a place around my waist treated last summer; got a place on back of my left leg" (10/03/2012)    Social History   Tobacco Use   Smoking status: Never    Smokeless tobacco: Never  Vaping Use   Vaping status: Never Used  Substance Use Topics   Alcohol use: No    Alcohol/week: 0.0 standard drinks of alcohol   Drug use: No   Past Surgical History:  Procedure Laterality Date   CARDIAC CATHETERIZATION  10/03/2012   CATARACT EXTRACTION W/ INTRAOCULAR LENS  IMPLANT, BILATERAL Bilateral 2000's   COLONOSCOPY     CORONARY ANGIOPLASTY WITH STENT PLACEMENT  05/29/2007   "1" (10/03/2012)   CYST EXCISION  1980's?   "or fatty tumor; taken off my back" (10/03/2012)   KNEE SURGERY     right   LEFT HEART CATHETERIZATION WITH CORONARY ANGIOGRAM N/A 10/03/2012   Procedure: LEFT HEART CATHETERIZATION WITH CORONARY ANGIOGRAM;  Surgeon: Kathleene Hazel, MD;  Location: [name] CATH LAB;  Service: Cardiovascular;  Laterality: N/A;   LUMBAR EPIDURAL INJECTION  ~ 2004   MOHS SURGERY Bilateral 2011-2012   "top of my head" (10/03/2012)   PARTIAL KNEE ARTHROPLASTY Right 11/08/2015   Procedure: RIGHT UNICOMPARTMENTAL KNEE;  Surgeon: Sheral Apley, MD;  Location: [facility] OR;  Service: Orthopedics;  Laterality: Right;   TONSILLECTOMY  ~ 1945   VASECTOMY     Family History  Problem Relation Age of Onset   Alzheimer's disease Mother  Lymphoma Father    Coronary artery disease Brother    COPD Brother    Diabetes Brother    Colon cancer Paternal Uncle    Diabetes Brother    Allergies  Allergen Reactions   Other Other (See Comments)    ANTI-DEPRESSANTS. ANTI-DEPRESSANTS Antidepressants...has a variety of reactions to different antidepressants   Prozac [Fluoxetine Hcl] Other (See Comments)    nightmares   Oxycodone Other (See Comments)    unknown   Current Medications:   Current Outpatient Medications:    acetaminophen (TYLENOL) 650 MG CR tablet, Take 650 mg by mouth 2 (two) times daily., Disp: , Rfl:    ALPRAZolam (XANAX) 0.5 MG tablet, Take 1 tablet (0.5 mg total) by mouth 2 (two) times daily as needed., Disp: 180 tablet, Rfl: 1   aspirin 81 MG tablet,  Take 81 mg by mouth daily., Disp: , Rfl:    atorvastatin (LIPITOR) 20 MG tablet, Take 1 tablet (20 mg total) by mouth daily., Disp: 90 tablet, Rfl: 3   b complex vitamins capsule, Take 1 capsule by mouth daily., Disp: , Rfl:    carvedilol (COREG) 3.125 MG tablet, Take 1 tablet (3.125 mg total) by mouth 2 (two) times daily with a meal., Disp: 180 tablet, Rfl: 3   co-enzyme Q-10 30 MG capsule, Take 20 mg by mouth daily., Disp: , Rfl:    fluticasone (FLONASE) 50 MCG/ACT nasal spray, USE 1 SPRAY IN EACH NOSTRILTWICE DAILY, Disp: 48 g, Rfl: 0   ketoconazole 2%-triamcinolone 0.1% 1:2 cream mixture, Apply topically 2 (two) times daily as needed., Disp: 45 g, Rfl: 1   losartan (COZAAR) 50 MG tablet, Take 1 tablet (50 mg total) by mouth daily., Disp: 90 tablet, Rfl: 3   nitroGLYCERIN (NITROSTAT) 0.4 MG SL tablet, Place 1 tablet (0.4 mg total) under the tongue every 5 (five) minutes as needed for chest pain (for chest pain)., Disp: 25 tablet, Rfl: 3   Omega-3 Fatty Acids (FISH OIL) 1000 MG CAPS, Take 1,000 mg by mouth 2 (two) times daily. , Disp: , Rfl:    Saw Palmetto 450 MG CAPS, Take 450 mg by mouth daily., Disp: , Rfl:    VITAMIN D PO, Take 1,000 mg by mouth daily., Disp: , Rfl:   Review of Systems:   ROS See pertinent positives and negatives as per the HPI.  Vitals:   Vitals:   03/28/23 1002  BP: 130/78  Pulse: 62  Temp: (!) 97.3 F (36.3 C)  TempSrc: Temporal  SpO2: 98%  Weight: 151 lb (68.5 kg)  Height: 5\' 5"  (1.651 m)     Body mass index is 25.13 kg/m.  Physical Exam:   Physical Exam Vitals and nursing note reviewed.  Constitutional:      General: He is not in acute distress.    Appearance: He is well-developed. He is not ill-appearing or toxic-appearing.  HENT:     Head: Normocephalic and atraumatic.     Right Ear: Tympanic membrane, ear canal and external ear normal. Tympanic membrane is not erythematous, retracted or bulging.     Left Ear: Tympanic membrane, ear canal and  external ear normal. Tympanic membrane is not erythematous, retracted or bulging.     Nose: Nose normal.     Right Sinus: No maxillary sinus tenderness or frontal sinus tenderness.     Left Sinus: No maxillary sinus tenderness or frontal sinus tenderness.     Mouth/Throat:     Pharynx: Uvula midline. Posterior oropharyngeal erythema present.  Eyes:  General: Lids are normal.     Conjunctiva/sclera: Conjunctivae normal.     Pupils: Pupils are equal, round, and reactive to light.  Neck:     Trachea: Trachea normal.  Cardiovascular:     Rate and Rhythm: Normal rate and regular rhythm.     Heart sounds: Normal heart sounds, S1 normal and S2 normal.  Pulmonary:     Effort: Pulmonary effort is normal.     Breath sounds: Normal breath sounds. No decreased breath sounds, wheezing, rhonchi or rales.  Musculoskeletal:        General: Normal range of motion.     Cervical back: Normal range of motion.  Lymphadenopathy:     Cervical: No cervical adenopathy.  Skin:    General: Skin is warm and dry.  Neurological:     Mental Status: He is alert and oriented to person, place, and time.  Psychiatric:        Speech: Speech normal.        Behavior: Behavior normal. Behavior is cooperative.        Thought Content: Thought content normal.        Judgment: Judgment normal.     Assessment and Plan:   Acute cough No red flags on exam.  Will initiate Augmentin and tessalon perles per orders. Discussed taking medications as prescribed. Reviewed return precautions including worsening fever, SOB, worsening cough or other concerns. Push fluids and rest. I recommend that patient follow-up if symptoms worsen or persist despite treatment x 7-10 days, sooner if needed.  Darra Lis as a scribe for [employer], PA.,have documented all relevant documentation on the behalf of Jarold Motto, PA,as directed by  Jarold Motto, PA while in the presence of Jarold Motto, [place].  I, Jarold Motto, [place], have reviewed all documentation for this visit. The documentation on 03/28/23 for the exam, diagnosis, procedures, and orders are all accurate and complete.  Jarold Motto, PA-C

## 2023-03-28 NOTE — Patient Instructions (Signed)
It was great to see you!  Start Augmentin for your infection  Continue your delsym  Follow-up if new/worsening or lack of improvement  Take care,  Jarold Motto PA-C

## 2023-04-08 NOTE — Progress Notes (Signed)
[name] is a 82 y.o. male {ELNP/FU:31256} History of Present Illness:   Health Maintenance Due  Topic Date Due   Medicare Annual Wellness (AWV)  05/19/2021   COVID-19 Vaccine (8 - 2023-24 season) 04/08/2023   No chief complaint on file.  HPI Cough/Congestion: {ELStarters:31163} cough/congestion   Endorses {Associated Sx:31667}  Denies {Associated Sx:31667}   ***  ***  ***  ***  *** Past Medical History:  Diagnosis Date   Anxiety    Arthritis of multiple joints    Basal cell carcinoma of right scalp    Benign prostatic hypertrophy    CAD (coronary artery disease)    Chronic anticoagulation 02/20/2016   Chronic lower back pain, disk disease    Depression    Diverticulosis of colon    Dysrhythmia    Factor VII deficiency (HCC)    10/28/15: No history of factor VII deficiency per Dr. Cyndie Chime   History of blood transfusion 1941   History of colon polyps    Hyperlipidemia    ITP (idiopathic thrombocytopenic purpura)    Kidney stones x 2, both passed    Myocardial infarction (HCC) 2009   Seasonal allergies    Squamous carcinoma left scalp    T-cell lymphoma (HCC)    "I'm in stage I; original site was on my left chest; had a place around my waist treated last summer; got a place on back of my left leg" (10/03/2012)    Social History   Tobacco Use   Smoking status: Never   Smokeless tobacco: Never  Vaping Use   Vaping status: Never Used  Substance Use Topics   Alcohol use: No    Alcohol/week: 0.0 standard drinks of alcohol   Drug use: No   Past Surgical History:  Procedure Laterality Date   CARDIAC CATHETERIZATION  10/03/2012   CATARACT EXTRACTION W/ INTRAOCULAR LENS  IMPLANT, BILATERAL Bilateral 2000's   COLONOSCOPY     CORONARY ANGIOPLASTY WITH STENT PLACEMENT  05/29/2007   "1" (10/03/2012)   CYST EXCISION  1980's?   "or fatty tumor; taken off my back" (10/03/2012)   KNEE SURGERY     right   LEFT HEART CATHETERIZATION WITH CORONARY ANGIOGRAM N/A  10/03/2012   Procedure: LEFT HEART CATHETERIZATION WITH CORONARY ANGIOGRAM;  Surgeon: Kathleene Hazel, MD;  Location: [facility] CATH LAB;  Service: Cardiovascular;  Laterality: N/A;   LUMBAR EPIDURAL INJECTION  ~ 2004   MOHS SURGERY Bilateral 2011-2012   "top of my head" (10/03/2012)   PARTIAL KNEE ARTHROPLASTY Right 11/08/2015   Procedure: RIGHT UNICOMPARTMENTAL KNEE;  Surgeon: Sheral Apley, MD;  Location: [facility] OR;  Service: Orthopedics;  Laterality: Right;   TONSILLECTOMY  ~ 1945   VASECTOMY     Family History  Problem Relation Age of Onset   Alzheimer's disease Mother    Lymphoma Father    Coronary artery disease Brother    COPD Brother    Diabetes Brother    Colon cancer Paternal Uncle    Diabetes Brother    Allergies  Allergen Reactions   Other Other (See Comments)    ANTI-DEPRESSANTS. ANTI-DEPRESSANTS Antidepressants...has a variety of reactions to different antidepressants   Prozac [Fluoxetine Hcl] Other (See Comments)    nightmares   Oxycodone Other (See Comments)    unknown   Current Medications:   Current Outpatient Medications:    acetaminophen (TYLENOL) 650 MG CR tablet, Take 650 mg by mouth 2 (two) times daily., Disp: , Rfl:    ALPRAZolam (XANAX) 0.5 MG  tablet, Take 1 tablet (0.5 mg total) by mouth 2 (two) times daily as needed., Disp: 180 tablet, Rfl: 1   amoxicillin-clavulanate (AUGMENTIN) 875-125 MG tablet, Take 1 tablet by mouth 2 (two) times daily., Disp: 14 tablet, Rfl: 0   aspirin 81 MG tablet, Take 81 mg by mouth daily., Disp: , Rfl:    atorvastatin (LIPITOR) 20 MG tablet, Take 1 tablet (20 mg total) by mouth daily., Disp: 90 tablet, Rfl: 3   b complex vitamins capsule, Take 1 capsule by mouth daily., Disp: , Rfl:    benzonatate (TESSALON) 100 MG capsule, Take 1 capsule (100 mg total) by mouth 2 (two) times daily as needed for cough., Disp: 20 capsule, Rfl: 0   carvedilol (COREG) 3.125 MG tablet, Take 1 tablet (3.125 mg total) by mouth 2 (two) times daily  with a meal., Disp: 180 tablet, Rfl: 3   co-enzyme Q-10 30 MG capsule, Take 20 mg by mouth daily., Disp: , Rfl:    fluticasone (FLONASE) 50 MCG/ACT nasal spray, USE 1 SPRAY IN EACH NOSTRILTWICE DAILY, Disp: 48 g, Rfl: 0   ketoconazole 2%-triamcinolone 0.1% 1:2 cream mixture, Apply topically 2 (two) times daily as needed., Disp: 45 g, Rfl: 1   losartan (COZAAR) 50 MG tablet, Take 1 tablet (50 mg total) by mouth daily., Disp: 90 tablet, Rfl: 3   nitroGLYCERIN (NITROSTAT) 0.4 MG SL tablet, Place 1 tablet (0.4 mg total) under the tongue every 5 (five) minutes as needed for chest pain (for chest pain)., Disp: 25 tablet, Rfl: 3   Omega-3 Fatty Acids (FISH OIL) 1000 MG CAPS, Take 1,000 mg by mouth 2 (two) times daily. , Disp: , Rfl:    Saw Palmetto 450 MG CAPS, Take 450 mg by mouth daily., Disp: , Rfl:    VITAMIN D PO, Take 1,000 mg by mouth daily., Disp: , Rfl:   Review of Systems:   ROS See pertinent positives and negatives as per the HPI.  Vitals:   There were no vitals filed for this visit.   There is no height or weight on file to calculate BMI.  Physical Exam:   Physical Exam  Assessment and Plan:   There are no diagnoses linked to this encounter.            I,Emily Lagle,acting as a Neurosurgeon for [employer], PA.,have documented all relevant documentation on the behalf of Jarold Motto, PA,as directed by  Jarold Motto, PA while in the presence of Jarold Motto, [place].  *** (refresh reminder)  I, Jarold Motto, PA, have reviewed all documentation for this visit. The documentation on 04/08/23 for the exam, diagnosis, procedures, and orders are all accurate and complete.  Jarold Motto, PA-C

## 2023-04-09 ENCOUNTER — Encounter: Payer: Self-pay | Admitting: Physician Assistant

## 2023-04-09 ENCOUNTER — Other Ambulatory Visit (HOSPITAL_COMMUNITY)
Admission: RE | Admit: 2023-04-09 | Discharge: 2023-04-09 | Disposition: A | Discharge status: Home / Self Care | Payer: Self-pay | Source: Ambulatory Visit | Attending: Oncology | Admitting: Oncology

## 2023-04-09 ENCOUNTER — Telehealth: Payer: Self-pay | Admitting: Family Medicine

## 2023-04-09 ENCOUNTER — Ambulatory Visit: Payer: Medicare Other | Admitting: Physician Assistant

## 2023-04-09 VITALS — BP 130/70 | HR 54 | Temp 97.3°F | Ht 65.0 in | Wt 153.0 lb

## 2023-04-09 DIAGNOSIS — R052 Subacute cough: Secondary | ICD-10-CM | POA: Diagnosis not present

## 2023-04-09 DIAGNOSIS — Z006 Encounter for examination for normal comparison and control in clinical research program: Secondary | ICD-10-CM | POA: Insufficient documentation

## 2023-04-09 MED ORDER — BENZONATATE 100 MG PO CAPS
100.0000 mg | ORAL_CAPSULE | Freq: Three times a day (TID) | ORAL | 1 refills | Status: DC | PRN
Start: 2023-04-09 — End: 2023-05-31

## 2023-04-09 MED ORDER — IPRATROPIUM BROMIDE 0.03 % NA SOLN: 2.0000 | Freq: Two times a day (BID) | NASAL | 12 refills | Status: DC

## 2023-04-09 NOTE — Patient Instructions (Addendum)
It was great to see you!  Start your medrol dose pack you have at home (steroids)  Use saline spray and then atrovent nasal spray to dry up your post nasal drip  Refill tessalon perles  Keep [place] posted on your symptom(s)!  Take care,  Jarold Motto PA-C

## 2023-04-09 NOTE — Telephone Encounter (Signed)
Pt called and states he does want the antibiotic to be sent to the pharmacy. Please advise.

## 2023-04-09 NOTE — Telephone Encounter (Signed)
Please see message and advise 

## 2023-04-10 ENCOUNTER — Other Ambulatory Visit: Payer: Self-pay | Admitting: Physician Assistant

## 2023-04-10 MED ORDER — DOXYCYCLINE HYCLATE 100 MG PO TABS: 100.0000 mg | ORAL_TABLET | Freq: Two times a day (BID) | ORAL | 0 refills | Status: DC

## 2023-04-10 NOTE — Telephone Encounter (Signed)
Spoke to pt told him [name] sent in Doxycycline for him. Pt verbalized understanding.

## 2023-04-23 LAB — GENECONNECT MOLECULAR SCREEN: Genetic Analysis Overall Interpretation: NEGATIVE

## 2023-05-06 ENCOUNTER — Ambulatory Visit (INDEPENDENT_AMBULATORY_CARE_PROVIDER_SITE_OTHER): Payer: Medicare Other

## 2023-05-06 VITALS — Wt 153.0 lb

## 2023-05-06 DIAGNOSIS — Z0000 Encounter for general adult medical examination without abnormal findings: Secondary | ICD-10-CM

## 2023-05-06 NOTE — Patient Instructions (Signed)
Mr. Brandell , Thank you for taking time to come for your Medicare Wellness Visit. I appreciate your ongoing commitment to your health goals. Please review the following plan we discussed and let me know if I can assist you in the future.   Referrals/Orders/Follow-Ups/Clinician Recommendations: maintain health and activity   This is a list of the screening recommended for you and due dates:  Health Maintenance  Topic Date Due   Medicare Annual Wellness Visit  05/19/2021   Colon Cancer Screening  05/30/2023*   Pneumonia Vaccine (2 of 2 - PPSV23 or PCV20) 11/02/2023*   COVID-19 Vaccine (8 - 2024-25 season) 04/08/2024*   Flu Shot  Completed   HPV Vaccine  Aged Out   DTaP/Tdap/Td vaccine  Discontinued   Zoster (Shingles) Vaccine  Discontinued  *Topic was postponed. The date shown is not the original due date.    Advanced directives: (Copy Requested) Please bring a copy of your health care power of attorney and living will to the office to be added to your chart at your convenience.  Next Medicare Annual Wellness Visit scheduled for next year: Yes

## 2023-05-06 NOTE — Progress Notes (Signed)
Subjective:   [name] is a 82 y.o. male who presents for Medicare Annual/Subsequent preventive examination.  Visit Complete: Virtual I connected with  [name] on 05/06/23 by a audio enabled telemedicine application and verified that I am speaking with the correct person using two identifiers.  Patient Location: Home  Provider Location: Office/Clinic  I discussed the limitations of evaluation and management by telemedicine. The patient expressed understanding and agreed to proceed.  Vital Signs: Because this visit was a virtual/telehealth visit, some criteria may be missing or patient reported. Any vitals not documented were not able to be obtained and vitals that have been documented are patient reported.  Patient Medicare AWV questionnaire was completed by the patient on 05/05/23; I have confirmed that all information answered by patient is correct and no changes since this date.  Cardiac Risk Factors include: advanced age (>14men, >69 women);dyslipidemia;male gender;hypertension     Objective:    Today's Vitals   05/06/23 1104  Weight: 153 lb (69.4 kg)   Body mass index is 25.46 kg/m.     05/06/2023   11:10 AM 03/07/2023   11:03 AM 06/07/2022   10:46 AM 10/29/2021    1:12 PM 12/13/2020    1:39 PM 07/13/2019    4:17 PM 03/24/2018   11:23 AM  Advanced Directives  Does Patient Have a Medical Advance Directive? Yes Yes Yes No Yes Yes No  Type of Estate agent of [place];Living will Healthcare Power of [place];Living will Living will;Healthcare Power of [employer] Power of [place];Living will Healthcare Power of [place];Living will   Does patient want to make changes to medical advance directive?  No - Patient declined No - Patient declined   No - Patient declined   Copy of Healthcare Power of Attorney in Chart? No - copy requested No - copy requested No - copy requested   No - copy requested   Would patient like information on creating a  medical advance directive?    No - Patient declined   No - Patient declined    Current Medications (verified) Outpatient Encounter Medications as of 05/06/2023  Medication Sig   acetaminophen (TYLENOL) 650 MG CR tablet Take 650 mg by mouth 2 (two) times daily.   ALPRAZolam (XANAX) 0.5 MG tablet Take 1 tablet (0.5 mg total) by mouth 2 (two) times daily as needed.   aspirin 81 MG tablet Take 81 mg by mouth daily.   atorvastatin (LIPITOR) 20 MG tablet Take 1 tablet (20 mg total) by mouth daily.   b complex vitamins capsule Take 1 capsule by mouth daily.   benzonatate (TESSALON PERLES) 100 MG capsule Take 1 capsule (100 mg total) by mouth 3 (three) times daily as needed for cough.   carvedilol (COREG) 3.125 MG tablet Take 1 tablet (3.125 mg total) by mouth 2 (two) times daily with a meal.   co-enzyme Q-10 30 MG capsule Take 20 mg by mouth daily.   fluticasone (FLONASE) 50 MCG/ACT nasal spray USE 1 SPRAY IN EACH NOSTRILTWICE DAILY   ipratropium (ATROVENT) 0.03 % nasal spray Place 2 sprays into both nostrils every 12 (twelve) hours.   ketoconazole 2%-triamcinolone 0.1% 1:2 cream mixture Apply topically 2 (two) times daily as needed.   losartan (COZAAR) 50 MG tablet Take 1 tablet (50 mg total) by mouth daily.   nitroGLYCERIN (NITROSTAT) 0.4 MG SL tablet Place 1 tablet (0.4 mg total) under the tongue every 5 (five) minutes as needed for chest pain (for chest  pain).   Omega-3 Fatty Acids (FISH OIL) 1000 MG CAPS Take 1,000 mg by mouth 2 (two) times daily.    Saw Palmetto 450 MG CAPS Take 450 mg by mouth daily.   VITAMIN D PO Take 1,000 mg by mouth daily.   [DISCONTINUED] doxycycline (VIBRA-TABS) 100 MG tablet Take 1 tablet (100 mg total) by mouth 2 (two) times daily.   No facility-administered encounter medications on file as of 05/06/2023.    Allergies (verified) Other, Prozac [fluoxetine hcl], and Oxycodone   History: Past Medical History:  Diagnosis Date   Anxiety    Arthritis of multiple  joints    Basal cell carcinoma of right scalp    Benign prostatic hypertrophy    CAD (coronary artery disease)    Chronic anticoagulation 02/20/2016   Chronic lower back pain, disk disease    Depression    Diverticulosis of colon    Dysrhythmia    Factor VII deficiency (HCC)    10/28/15: No history of factor VII deficiency per Dr. Cyndie Chime   History of blood transfusion 1941   History of colon polyps    Hyperlipidemia    ITP (idiopathic thrombocytopenic purpura)    Kidney stones x 2, both passed    Myocardial infarction (HCC) 2009   Seasonal allergies    Squamous carcinoma left scalp    T-cell lymphoma (HCC)    "I'm in stage I; original site was on my left chest; had a place around my waist treated last summer; got a place on back of my left leg" (10/03/2012)   Past Surgical History:  Procedure Laterality Date   CARDIAC CATHETERIZATION  10/03/2012   CATARACT EXTRACTION W/ INTRAOCULAR LENS  IMPLANT, BILATERAL Bilateral 2000's   COLONOSCOPY     CORONARY ANGIOPLASTY WITH STENT PLACEMENT  05/29/2007   "1" (10/03/2012)   CYST EXCISION  1980's?   "or fatty tumor; taken off my back" (10/03/2012)   KNEE SURGERY     right   LEFT HEART CATHETERIZATION WITH CORONARY ANGIOGRAM N/A 10/03/2012   Procedure: LEFT HEART CATHETERIZATION WITH CORONARY ANGIOGRAM;  Surgeon: Kathleene Hazel, MD;  Location[facility] CATH LAB;  Service: Cardiovascular;  Laterality: N/A;   LUMBAR EPIDURAL INJECTION  ~ 2004   MOHS SURGERY Bilateral 2011-2012   "top of my head" (10/03/2012)   PARTIAL KNEE ARTHROPLASTY Right 11/08/2015   Procedure: RIGHT UNICOMPARTMENTAL KNEE;  Surgeon: Sheral Apley, MD;  Location: [facility] OR;  Service: Orthopedics;  Laterality: Right;   TONSILLECTOMY  ~ 1945   VASECTOMY     Family History  Problem Relation Age of Onset   Alzheimer's disease Mother    Lymphoma Father    Coronary artery disease Brother    COPD Brother    Diabetes Brother    Colon cancer Paternal Uncle    Diabetes  Brother    Social History   Socioeconomic History   Marital status: Widowed    Spouse name: Not on file   Number of children: 2   Years of education: Not on file   Highest education level: Some college, no degree  Occupational History   Occupation: Retired    Comment: Airline pilot- Clinical cytogeneticist   Tobacco Use   Smoking status: Never   Smokeless tobacco: Never  Vaping Use   Vaping status: Never Used  Substance and Sexual Activity   Alcohol use: No    Alcohol/week: 0.0 standard drinks of alcohol   Drug use: No   Sexual activity: Not Currently  Other Topics Concern  Not on file  Social History Narrative   Wife is in a skilled facility- dementia    2 children- 1 locally and 1 in [place]    Hobbies: golfing    Social Drivers of Health   Financial Resource Strain: Patient Declined (05/06/2023)   Overall Financial Resource Strain (CARDIA)    Difficulty of Paying Living Expenses: Patient declined  Food Insecurity: Patient Declined (05/06/2023)   Hunger Vital Sign    Worried About Running Out of Food in the Last Year: Patient declined    Ran Out of Food in the Last Year: Patient declined  Transportation Needs: No Transportation Needs (05/06/2023)   PRAPARE - Administrator, Civil Service (Medical): No    Lack of Transportation (Non-Medical): No  Physical Activity: Patient Declined (05/06/2023)   Exercise Vital Sign    Days of Exercise per Week: Patient declined    Minutes of Exercise per Session: Patient declined  Stress: Patient Declined (05/06/2023)   [employer] of Occupational Health - Occupational Stress Questionnaire    Feeling of Stress : Patient declined  Social Connections: Moderately Isolated (05/06/2023)   Social Connection and Isolation Panel [NHANES]    Frequency of Communication with Friends and Family: More than three times a week    Frequency of Social Gatherings with Friends and Family: More than three times a week    Attends Religious Services:  Never    Database administrator or Organizations: Yes    Attends Banker Meetings: 1 to 4 times per year    Marital Status: Widowed    Tobacco Counseling Counseling given: Not Answered   Clinical Intake:  Pre-visit preparation completed: Yes  Pain : No/denies pain     BMI - recorded: 25.46 Nutritional Risks: None Diabetes: No  How often do you need to have someone help you when you read instructions, pamphlets, or other written materials from your doctor or pharmacy?: 1 - Never  Interpreter Needed?: No  Information entered by :: Lanier Ensign, LPN   Activities of Daily Living    05/05/2023   10:34 AM  In your present state of health, do you have any difficulty performing the following activities:  Hearing? 0  Vision? 0  Difficulty concentrating or making decisions? 0  Walking or climbing stairs? 1  Comment arthritis  Dressing or bathing? 1  Comment artritis  Doing errands, shopping? 0  Preparing Food and eating ? N  Using the Toilet? N  In the past six months, have you accidently leaked urine? Y  Comment at times  Do you have problems with loss of bowel control? N  Managing your Medications? N  Managing your Finances? N  Housekeeping or managing your Housekeeping? Y  Comment assistance    Patient Care Team: Ardith Dark, MD as PCP - General (Family Medicine) Kathleene Hazel, MD as PCP - Cardiology (Cardiology) Kathleene Hazel, MD as Consulting Physician (Cardiology) Kathleene Hazel, MD as Consulting Physician (Cardiology) Elmon Else, MD as Consulting Physician (Dermatology) Ladene Artist, MD as Consulting Physician (Oncology)  Indicate any recent Medical Services you may have received from other than [facility] providers in the past year (date may be approximate).     Assessment:   This is a routine wellness examination for [name].  Hearing/Vision screen Hearing Screening - Comments:: Pt  denies any hearing  issues  Vision Screening - Comments:: Pt follows up with [facility] for annual eye exams   Goals Addressed  This Visit's Progress    Patient Stated       Maintain health and activity        Depression Screen    05/06/2023   11:11 AM 12/04/2022    7:53 AM 11/02/2022    3:47 PM 11/02/2021   10:49 AM 03/16/2021   10:54 AM 06/09/2020    3:06 PM 07/13/2019    4:18 PM  PHQ 2/9 Scores  PHQ - 2 Score 1 0 0 0 0 1 0  PHQ- 9 Score  0 0 0 0 5     Fall Risk    05/05/2023   10:34 AM 12/04/2022    7:53 AM 11/02/2021   10:53 AM 03/16/2021   10:54 AM 07/13/2019    4:18 PM  Fall Risk   Falls in the past year? 0 0 0 0 0  Number falls in past yr: 0 0 0 0   Injury with Fall? 0 0 0 0 0  Risk for fall due to :  No Fall Risks No Fall Risks No Fall Risks   Follow up  Falls evaluation completed Falls evaluation completed  Falls evaluation completed;Education provided;Falls prevention discussed    MEDICARE RISK AT HOME: Medicare Risk at Home Any stairs in or around the home?: No If so, are there any without handrails?: (Patient-Rptd) No Home free of loose throw rugs in walkways, pet beds, electrical cords, etc?: (Patient-Rptd) Yes Adequate lighting in your home to reduce risk of falls?: (Patient-Rptd) Yes Life alert?: (Patient-Rptd) No Use of a cane, walker or w/c?: (Patient-Rptd) Yes Grab bars in the bathroom?: (Patient-Rptd) Yes Shower chair or bench in shower?: (Patient-Rptd) No Elevated toilet seat or a handicapped toilet?: (Patient-Rptd) Yes  TIMED UP AND GO:  Was the test performed?  No    Cognitive Function:    05/06/2023   11:15 AM  MMSE - Mini Mental State Exam  Not completed: Refused        07/13/2019    4:18 PM  6CIT Screen  What Year? 0 points  What month? 0 points  What time? 0 points  Count back from 20 0 points  Months in reverse 0 points  Repeat phrase 0 points  Total Score 0 points    Immunizations Immunization History   Administered Date(s) Administered   Influenza Split 03/14/2011   Influenza Whole 02/11/2023   Influenza, High Dose Seasonal PF 01/31/2018, 02/27/2022   Influenza,inj,Quad PF,6+ Mos 02/14/2017, 02/10/2019   Influenza-Unspecified 01/22/2014, 02/12/2020, 03/02/2021   Moderna Covid-19 Fall Seasonal Vaccine 44yrs & older 02/27/2022   Moderna Covid-19 Vaccine Bivalent Booster 76yrs & up 02/11/2023   Moderna SARS-COV2 Booster Vaccination 08/25/2020, 04/20/2021   Moderna Sars-Covid-2 Vaccination 05/27/2019, 06/24/2019, 03/07/2020   Pneumococcal Conjugate-13 06/13/2005, 08/10/2013   Tdap 06/13/2009      Flu Vaccine status: Up to date  Pneumococcal vaccine status: Due, Education has been provided regarding the importance of this vaccine. Advised may receive this vaccine at local pharmacy or Health Dept. Aware to provide a copy of the vaccination record if obtained from local pharmacy or Health Dept. Verbalized acceptance and understanding.  Covid-19 vaccine status: Information provided on how to obtain vaccines.   Qualifies for Shingles Vaccine? Yes   Zostavax completed No   Shingrix Completed?: No.    Education has been provided regarding the importance of this vaccine. Patient has been advised to call insurance company to determine out of pocket expense if they have not yet received this vaccine. Advised may also receive vaccine  at local pharmacy or Health Dept. Verbalized acceptance and understanding.  Screening Tests Health Maintenance  Topic Date Due   Colonoscopy  05/30/2023 (Originally 09/07/2021)   Pneumonia Vaccine 61+ Years old (2 of 2 - PPSV23 or PCV20) 11/02/2023 (Originally 10/05/2013)   COVID-19 Vaccine (8 - 2024-25 season) 04/08/2024 (Originally 04/08/2023)   Medicare Annual Wellness (AWV)  05/05/2024   INFLUENZA VACCINE  Completed   HPV VACCINES  Aged Out   DTaP/Tdap/Td  Discontinued   Zoster Vaccines- Shingrix  Discontinued    Health Maintenance  There are no  preventive care reminders to display for this patient.   Colorectal cancer screening: No longer required.   Additional Screening:   Vision Screening: Recommended annual ophthalmology exams for early detection of glaucoma and other disorders of the eye. Is the patient up to date with their annual eye exam?  Yes  Who is the provider or what is the name of the office in which the patient attends annual eye exams? [place] [facility]  If pt is not established with a provider, would they like to be referred to a provider to establish care? No .   Dental Screening: Recommended annual dental exams for proper oral hygiene   Community Resource Referral / Chronic Care Management: CRR required this visit?  No   CCM required this visit?  No     Plan:     I have personally reviewed and noted the following in the patient's chart:   Medical and social history Use of alcohol, tobacco or illicit drugs  Current medications and supplements including opioid prescriptions. Patient is not currently taking opioid prescriptions. Functional ability and status Nutritional status Physical activity Advanced directives List of other physicians Hospitalizations, surgeries, and ER visits in previous 12 months Vitals Screenings to include cognitive, depression, and falls Referrals and appointments  In addition, I have reviewed and discussed with patient certain preventive protocols, quality metrics, and best practice recommendations. A written personalized care plan for preventive services as well as general preventive health recommendations were provided to patient.     Marzella Schlein, LPN   16/02/[id number]   After Visit Summary: (MyChart) Due to this being a telephonic visit, the after visit summary with patients personalized plan was offered to patient via MyChart   Nurse Notes Pt refused cognition just completed through long term care insurance pt was knowledgeable to questions asked.

## 2023-05-06 NOTE — Progress Notes (Deleted)
Subjective:   [name] is a 82 y.o. male who presents for Medicare Annual/Subsequent preventive examination.  Visit Complete: Virtual I connected with  [name] on 05/06/23 by a audio enabled telemedicine application and verified that I am speaking with the correct person using two identifiers.  Patient Location: Home  Provider Location: Office/Clinic  I discussed the limitations of evaluation and management by telemedicine. The patient expressed understanding and agreed to proceed.  Vital Signs: Because this visit was a virtual/telehealth visit, some criteria may be missing or patient reported. Any vitals not documented were not able to be obtained and vitals that have been documented are patient reported.  Patient Medicare AWV questionnaire was completed by the patient on 05/05/23; I have confirmed that all information answered by patient is correct and no changes since this date.  Cardiac Risk Factors include: advanced age (>25men, >31 women);dyslipidemia;male gender;hypertension     Objective:    Today's Vitals   05/06/23 1104  Weight: 153 lb (69.4 kg)   Body mass index is 25.46 kg/m.     05/06/2023   11:10 AM 03/07/2023   11:03 AM 06/07/2022   10:46 AM 10/29/2021    1:12 PM 12/13/2020    1:39 PM 07/13/2019    4:17 PM 03/24/2018   11:23 AM  Advanced Directives  Does Patient Have a Medical Advance Directive? Yes Yes Yes No Yes Yes No  Type of Estate agent of [place];Living will Healthcare Power of [place];Living will Living will;Healthcare Power of [employer] Power of [place];Living will Healthcare Power of [place];Living will   Does patient want to make changes to medical advance directive?  No - Patient declined No - Patient declined   No - Patient declined   Copy of Healthcare Power of Attorney in Chart? No - copy requested No - copy requested No - copy requested   No - copy requested   Would patient like information on creating a  medical advance directive?    No - Patient declined   No - Patient declined    Current Medications (verified) Outpatient Encounter Medications as of 05/06/2023  Medication Sig   acetaminophen (TYLENOL) 650 MG CR tablet Take 650 mg by mouth 2 (two) times daily.   ALPRAZolam (XANAX) 0.5 MG tablet Take 1 tablet (0.5 mg total) by mouth 2 (two) times daily as needed.   aspirin 81 MG tablet Take 81 mg by mouth daily.   atorvastatin (LIPITOR) 20 MG tablet Take 1 tablet (20 mg total) by mouth daily.   b complex vitamins capsule Take 1 capsule by mouth daily.   benzonatate (TESSALON PERLES) 100 MG capsule Take 1 capsule (100 mg total) by mouth 3 (three) times daily as needed for cough.   carvedilol (COREG) 3.125 MG tablet Take 1 tablet (3.125 mg total) by mouth 2 (two) times daily with a meal.   co-enzyme Q-10 30 MG capsule Take 20 mg by mouth daily.   fluticasone (FLONASE) 50 MCG/ACT nasal spray USE 1 SPRAY IN EACH NOSTRILTWICE DAILY   ipratropium (ATROVENT) 0.03 % nasal spray Place 2 sprays into both nostrils every 12 (twelve) hours.   ketoconazole 2%-triamcinolone 0.1% 1:2 cream mixture Apply topically 2 (two) times daily as needed.   losartan (COZAAR) 50 MG tablet Take 1 tablet (50 mg total) by mouth daily.   nitroGLYCERIN (NITROSTAT) 0.4 MG SL tablet Place 1 tablet (0.4 mg total) under the tongue every 5 (five) minutes as needed for chest pain (for chest  pain).   Omega-3 Fatty Acids (FISH OIL) 1000 MG CAPS Take 1,000 mg by mouth 2 (two) times daily.    Saw Palmetto 450 MG CAPS Take 450 mg by mouth daily.   VITAMIN D PO Take 1,000 mg by mouth daily.   [DISCONTINUED] doxycycline (VIBRA-TABS) 100 MG tablet Take 1 tablet (100 mg total) by mouth 2 (two) times daily.   No facility-administered encounter medications on file as of 05/06/2023.    Allergies (verified) Other, Prozac [fluoxetine hcl], and Oxycodone   History: Past Medical History:  Diagnosis Date   Anxiety    Arthritis of multiple  joints    Basal cell carcinoma of right scalp    Benign prostatic hypertrophy    CAD (coronary artery disease)    Chronic anticoagulation 02/20/2016   Chronic lower back pain, disk disease    Depression    Diverticulosis of colon    Dysrhythmia    Factor VII deficiency (HCC)    10/28/15: No history of factor VII deficiency per Dr. Cyndie Chime   History of blood transfusion 1941   History of colon polyps    Hyperlipidemia    ITP (idiopathic thrombocytopenic purpura)    Kidney stones x 2, both passed    Myocardial infarction (HCC) 2009   Seasonal allergies    Squamous carcinoma left scalp    T-cell lymphoma (HCC)    "I'm in stage I; original site was on my left chest; had a place around my waist treated last summer; got a place on back of my left leg" (10/03/2012)   Past Surgical History:  Procedure Laterality Date   CARDIAC CATHETERIZATION  10/03/2012   CATARACT EXTRACTION W/ INTRAOCULAR LENS  IMPLANT, BILATERAL Bilateral 2000's   COLONOSCOPY     CORONARY ANGIOPLASTY WITH STENT PLACEMENT  05/29/2007   "1" (10/03/2012)   CYST EXCISION  1980's?   "or fatty tumor; taken off my back" (10/03/2012)   KNEE SURGERY     right   LEFT HEART CATHETERIZATION WITH CORONARY ANGIOGRAM N/A 10/03/2012   Procedure: LEFT HEART CATHETERIZATION WITH CORONARY ANGIOGRAM;  Surgeon: Kathleene Hazel, MD;  Location: [facility] CATH LAB;  Service: Cardiovascular;  Laterality: N/A;   LUMBAR EPIDURAL INJECTION  ~ 2004   MOHS SURGERY Bilateral 2011-2012   "top of my head" (10/03/2012)   PARTIAL KNEE ARTHROPLASTY Right 11/08/2015   Procedure: RIGHT UNICOMPARTMENTAL KNEE;  Surgeon: Sheral Apley, MD;  Location: [facility] OR;  Service: Orthopedics;  Laterality: Right;   TONSILLECTOMY  ~ 1945   VASECTOMY     Family History  Problem Relation Age of Onset   Alzheimer's disease Mother    Lymphoma Father    Coronary artery disease Brother    COPD Brother    Diabetes Brother    Colon cancer Paternal Uncle    Diabetes  Brother    Social History   Socioeconomic History   Marital status: Married    Spouse name: Not on file   Number of children: 2   Years of education: Not on file   Highest education level: Some college, no degree  Occupational History   Occupation: Retired    Comment: Airline pilot- Clinical cytogeneticist   Tobacco Use   Smoking status: Never   Smokeless tobacco: Never  Vaping Use   Vaping status: Never Used  Substance and Sexual Activity   Alcohol use: No    Alcohol/week: 0.0 standard drinks of alcohol   Drug use: No   Sexual activity: Not Currently  Other Topics Concern  Not on file  Social History Narrative   Wife is in a skilled facility- dementia    2 children- 1 locally and 1 in [place]    Hobbies: golfing    Social Drivers of Health   Financial Resource Strain: Patient Declined (05/06/2023)   Overall Financial Resource Strain (CARDIA)    Difficulty of Paying Living Expenses: Patient declined  Food Insecurity: Patient Declined (05/06/2023)   Hunger Vital Sign    Worried About Running Out of Food in the Last Year: Patient declined    Ran Out of Food in the Last Year: Patient declined  Transportation Needs: No Transportation Needs (05/06/2023)   PRAPARE - Administrator, Civil Service (Medical): No    Lack of Transportation (Non-Medical): No  Physical Activity: Patient Declined (05/06/2023)   Exercise Vital Sign    Days of Exercise per Week: Patient declined    Minutes of Exercise per Session: Patient declined  Stress: Patient Declined (05/06/2023)   [employer] of Occupational Health - Occupational Stress Questionnaire    Feeling of Stress : Patient declined  Social Connections: Moderately Isolated (05/06/2023)   Social Connection and Isolation Panel [NHANES]    Frequency of Communication with Friends and Family: More than three times a week    Frequency of Social Gatherings with Friends and Family: More than three times a week    Attends Religious Services:  Never    Database administrator or Organizations: Yes    Attends Banker Meetings: 1 to 4 times per year    Marital Status: Widowed    Tobacco Counseling Counseling given: Not Answered   Clinical Intake:  Pre-visit preparation completed: Yes  Pain : No/denies pain     BMI - recorded: 25.46 Nutritional Risks: None Diabetes: No  How often do you need to have someone help you when you read instructions, pamphlets, or other written materials from your doctor or pharmacy?: 1 - Never  Interpreter Needed?: No  Information entered by :: Lanier Ensign, LPN   Activities of Daily Living    05/05/2023   10:34 AM  In your present state of health, do you have any difficulty performing the following activities:  Hearing? 0  Vision? 0  Difficulty concentrating or making decisions? 0  Walking or climbing stairs? 1  Comment arthritis  Dressing or bathing? 1  Comment artritis  Doing errands, shopping? 0  Preparing Food and eating ? N  Using the Toilet? N  In the past six months, have you accidently leaked urine? Y  Comment at times  Do you have problems with loss of bowel control? N  Managing your Medications? N  Managing your Finances? N  Housekeeping or managing your Housekeeping? Y  Comment assistance    Patient Care Team: Ardith Dark, MD as PCP - General (Family Medicine) Kathleene Hazel, MD as PCP - Cardiology (Cardiology) Kathleene Hazel, MD as Consulting Physician (Cardiology) Kathleene Hazel, MD as Consulting Physician (Cardiology) Elmon Else, MD as Consulting Physician (Dermatology) Ladene Artist, MD as Consulting Physician (Oncology)  Indicate any recent Medical Services you may have received from other than [facility] providers in the past year (date may be approximate).     Assessment:   This is a routine wellness examination for [name].  Hearing/Vision screen Hearing Screening - Comments:: Pt  denies any hearing  issues  Vision Screening - Comments:: Pt follows up with [facility] for annual eye exams   Goals Addressed  This Visit's Progress    Patient Stated       Maintain health and activity        Depression Screen    05/06/2023   11:11 AM 12/04/2022    7:53 AM 11/02/2022    3:47 PM 11/02/2021   10:49 AM 03/16/2021   10:54 AM 06/09/2020    3:06 PM 07/13/2019    4:18 PM  PHQ 2/9 Scores  PHQ - 2 Score 1 0 0 0 0 1 0  PHQ- 9 Score  0 0 0 0 5     Fall Risk    05/05/2023   10:34 AM 12/04/2022    7:53 AM 11/02/2021   10:53 AM 03/16/2021   10:54 AM 07/13/2019    4:18 PM  Fall Risk   Falls in the past year? 0 0 0 0 0  Number falls in past yr: 0 0 0 0   Injury with Fall? 0 0 0 0 0  Risk for fall due to :  No Fall Risks No Fall Risks No Fall Risks   Follow up  Falls evaluation completed Falls evaluation completed  Falls evaluation completed;Education provided;Falls prevention discussed    MEDICARE RISK AT HOME: Medicare Risk at Home Any stairs in or around the home?: No If so, are there any without handrails?: (Patient-Rptd) No Home free of loose throw rugs in walkways, pet beds, electrical cords, etc?: (Patient-Rptd) Yes Adequate lighting in your home to reduce risk of falls?: (Patient-Rptd) Yes Life alert?: (Patient-Rptd) No Use of a cane, walker or w/c?: (Patient-Rptd) Yes Grab bars in the bathroom?: (Patient-Rptd) Yes Shower chair or bench in shower?: (Patient-Rptd) No Elevated toilet seat or a handicapped toilet?: (Patient-Rptd) Yes  TIMED UP AND GO:  Was the test performed?  No    Cognitive Function:    05/06/2023   11:15 AM  MMSE - Mini Mental State Exam  Not completed: Refused        07/13/2019    4:18 PM  6CIT Screen  What Year? 0 points  What month? 0 points  What time? 0 points  Count back from 20 0 points  Months in reverse 0 points  Repeat phrase 0 points  Total Score 0 points    Immunizations Immunization History   Administered Date(s) Administered   Influenza Split 03/14/2011   Influenza Whole 02/11/2023   Influenza, High Dose Seasonal PF 01/31/2018, 02/27/2022   Influenza,inj,Quad PF,6+ Mos 02/14/2017, 02/10/2019   Influenza-Unspecified 01/22/2014, 02/12/2020, 03/02/2021   Moderna Covid-19 Fall Seasonal Vaccine 6yrs & older 02/27/2022   Moderna Covid-19 Vaccine Bivalent Booster 28yrs & up 02/11/2023   Moderna SARS-COV2 Booster Vaccination 08/25/2020, 04/20/2021   Moderna Sars-Covid-2 Vaccination 05/27/2019, 06/24/2019, 03/07/2020   Pneumococcal Conjugate-13 06/13/2005, 08/10/2013   Tdap 06/13/2009      Flu Vaccine status: Up to date  Pneumococcal vaccine status: Due, Education has been provided regarding the importance of this vaccine. Advised may receive this vaccine at local pharmacy or Health Dept. Aware to provide a copy of the vaccination record if obtained from local pharmacy or Health Dept. Verbalized acceptance and understanding.  Covid-19 vaccine status: Information provided on how to obtain vaccines.   Qualifies for Shingles Vaccine? Yes   Zostavax completed No   Shingrix Completed?: No.    Education has been provided regarding the importance of this vaccine. Patient has been advised to call insurance company to determine out of pocket expense if they have not yet received this vaccine. Advised may also receive vaccine  at local pharmacy or Health Dept. Verbalized acceptance and understanding.  Screening Tests Health Maintenance  Topic Date Due   Colonoscopy  05/30/2023 (Originally 09/07/2021)   Pneumonia Vaccine 31+ Years old (2 of 2 - PPSV23 or PCV20) 11/02/2023 (Originally 10/05/2013)   COVID-19 Vaccine (8 - 2024-25 season) 04/08/2024 (Originally 04/08/2023)   Medicare Annual Wellness (AWV)  05/05/2024   INFLUENZA VACCINE  Completed   HPV VACCINES  Aged Out   DTaP/Tdap/Td  Discontinued   Zoster Vaccines- Shingrix  Discontinued    Health Maintenance  There are no  preventive care reminders to display for this patient.   Colorectal cancer screening: No longer required.   Additional Screening:   Vision Screening: Recommended annual ophthalmology exams for early detection of glaucoma and other disorders of the eye. Is the patient up to date with their annual eye exam?  Yes  Who is the provider or what is the name of the office in which the patient attends annual eye exams? [facility]  If pt is not established with a provider, would they like to be referred to a provider to establish care? No .   Dental Screening: Recommended annual dental exams for proper oral hygiene   Community Resource Referral / Chronic Care Management: CRR required this visit?  No   CCM required this visit?  No     Plan:     I have personally reviewed and noted the following in the patient's chart:   Medical and social history Use of alcohol, tobacco or illicit drugs  Current medications and supplements including opioid prescriptions. Patient is not currently taking opioid prescriptions. Functional ability and status Nutritional status Physical activity Advanced directives List of other physicians Hospitalizations, surgeries, and ER visits in previous 12 months Vitals Screenings to include cognitive, depression, and falls Referrals and appointments  In addition, I have reviewed and discussed with patient certain preventive protocols, quality metrics, and best practice recommendations. A written personalized care plan for preventive services as well as general preventive health recommendations were provided to patient.     Marzella Schlein, LPN   [id number]   After Visit Summary: (MyChart) Due to this being a telephonic visit, the after visit summary with patients personalized plan was offered to patient via MyChart   Nurse Notes: none

## 2023-05-27 ENCOUNTER — Other Ambulatory Visit (INDEPENDENT_AMBULATORY_CARE_PROVIDER_SITE_OTHER): Payer: Medicare Other

## 2023-05-27 DIAGNOSIS — R17 Unspecified jaundice: Secondary | ICD-10-CM | POA: Diagnosis not present

## 2023-05-27 DIAGNOSIS — R739 Hyperglycemia, unspecified: Secondary | ICD-10-CM | POA: Diagnosis not present

## 2023-05-27 DIAGNOSIS — D693 Immune thrombocytopenic purpura: Secondary | ICD-10-CM | POA: Diagnosis not present

## 2023-05-27 DIAGNOSIS — E7800 Pure hypercholesterolemia, unspecified: Secondary | ICD-10-CM

## 2023-05-27 DIAGNOSIS — Z0000 Encounter for general adult medical examination without abnormal findings: Secondary | ICD-10-CM

## 2023-05-27 DIAGNOSIS — I10 Essential (primary) hypertension: Secondary | ICD-10-CM

## 2023-05-27 LAB — CBC
HCT: 49.1 % (ref 39.0–52.0)
Hemoglobin: 16 g/dL (ref 13.0–17.0)
MCHC: 32.6 g/dL (ref 30.0–36.0)
MCV: 101.5 fL — ABNORMAL HIGH (ref 78.0–100.0)
Platelets: 126 10*3/uL — ABNORMAL LOW (ref 150.0–400.0)
RBC: 4.84 Mil/uL (ref 4.22–5.81)
RDW: 13.3 % (ref 11.5–15.5)
WBC: 5.1 10*3/uL (ref 4.0–10.5)

## 2023-05-27 LAB — LIPID PANEL
Cholesterol: 147 mg/dL (ref 0–200)
HDL: 68.3 mg/dL (ref >39.00)
LDL Cholesterol: 59 mg/dL (ref 0–99)
NonHDL: 78.26
Total CHOL/HDL Ratio: 2
Triglycerides: 94 mg/dL (ref 0.0–149.0)
VLDL: 18.8 mg/dL (ref 0.0–40.0)

## 2023-05-27 LAB — COMPREHENSIVE METABOLIC PANEL
ALT: 20 U/L (ref 0–53)
AST: 22 U/L (ref 0–37)
Albumin: 4.4 g/dL (ref 3.5–5.2)
Alkaline Phosphatase: 73 U/L (ref 39–117)
BUN: 11 mg/dL (ref 6–23)
CO2: 27 meq/L (ref 19–32)
Calcium: 9.3 mg/dL (ref 8.4–10.5)
Chloride: 105 meq/L (ref 96–112)
Creatinine, Ser: 0.84 mg/dL (ref 0.40–1.50)
GFR: 80.01 mL/min (ref >60.00)
Glucose, Bld: 108 mg/dL — ABNORMAL HIGH (ref 70–99)
Potassium: 4.6 meq/L (ref 3.5–5.1)
Sodium: 140 meq/L (ref 135–145)
Total Bilirubin: 1.5 mg/dL — ABNORMAL HIGH (ref 0.2–1.2)
Total Protein: 6.5 g/dL (ref 6.0–8.3)

## 2023-05-27 LAB — PSA: PSA: 2.27 ng/mL (ref 0.10–4.00)

## 2023-05-27 LAB — TSH: TSH: 2.63 u[IU]/mL (ref 0.35–5.50)

## 2023-05-27 LAB — HEMOGLOBIN A1C: Hgb A1c MFr Bld: 6.2 % (ref 4.6–6.5)

## 2023-05-28 LAB — BILIRUBIN, FRACTIONATED(TOT/DIR/INDIR)
Bilirubin, Direct: 0.3 mg/dL — ABNORMAL HIGH (ref 0.0–0.2)
Indirect Bilirubin: 1.2 mg/dL (ref 0.2–1.2)
Total Bilirubin: 1.5 mg/dL — ABNORMAL HIGH (ref 0.2–1.2)

## 2023-05-30 NOTE — Progress Notes (Signed)
Labs are all stable compared to last year.  A1c is in the prediabetic range.  Do not need to start meds but he should work on diet and exercise and we can recheck in a year.  The rest of his labs are all at goal.  We can discuss more at his upcoming office visit.

## 2023-05-31 ENCOUNTER — Encounter: Payer: Self-pay | Admitting: Family Medicine

## 2023-05-31 ENCOUNTER — Ambulatory Visit (INDEPENDENT_AMBULATORY_CARE_PROVIDER_SITE_OTHER): Payer: Medicare Other | Admitting: Family Medicine

## 2023-05-31 VITALS — BP 120/80 | HR 101 | Temp 97.5°F | Ht 65.0 in | Wt 154.0 lb

## 2023-05-31 DIAGNOSIS — R059 Cough, unspecified: Secondary | ICD-10-CM | POA: Diagnosis not present

## 2023-05-31 DIAGNOSIS — I10 Essential (primary) hypertension: Secondary | ICD-10-CM | POA: Diagnosis not present

## 2023-05-31 DIAGNOSIS — J301 Allergic rhinitis due to pollen: Secondary | ICD-10-CM | POA: Diagnosis not present

## 2023-05-31 DIAGNOSIS — E7800 Pure hypercholesterolemia, unspecified: Secondary | ICD-10-CM

## 2023-05-31 DIAGNOSIS — I2510 Atherosclerotic heart disease of native coronary artery without angina pectoris: Secondary | ICD-10-CM

## 2023-05-31 DIAGNOSIS — F331 Major depressive disorder, recurrent, moderate: Secondary | ICD-10-CM | POA: Diagnosis not present

## 2023-05-31 DIAGNOSIS — F5104 Psychophysiologic insomnia: Secondary | ICD-10-CM | POA: Diagnosis not present

## 2023-05-31 DIAGNOSIS — D693 Immune thrombocytopenic purpura: Secondary | ICD-10-CM | POA: Diagnosis not present

## 2023-05-31 MED ORDER — ALPRAZOLAM 0.5 MG PO TABS: 0.5000 mg | ORAL_TABLET | Freq: Two times a day (BID) | ORAL | 1 refills | Status: DC | PRN

## 2023-05-31 NOTE — Assessment & Plan Note (Signed)
Following with cardiology.  On aspirin and statin.

## 2023-05-31 NOTE — Progress Notes (Signed)
[name] is a 83 y.o. male who presents today for an office visit.  Assessment/Plan:  New/Acute Problems: Elevated Bilirubin  Mildly elevated to 1.5. Fractionated bilirubin levels at goal. Do not need to do any further testing at this point. We can monitor yearly.   Cough May have some element of post infectious cough thought his has been an intermittent issues for years. He does have some post nasal drip which is likely contributing as well. Reassuring exam today. We did discuss referral to see pulmonologist and he is agreeable to this. Will place referral today.   Chronic Problems Addressed Today: Hyperlipidemia Last lipid panel at goal on Lipitor 20 mg daily. Recheck in 6-12 months.   Essential hypertension Blood pressure at goal on coreg 3.125 mg twice daily and losartan 50 mg daily.   Chronic ITP (idiopathic thrombocytopenia) (HCC) Follows with hematology. Last CBC at goal. Platelets stable.   MDD (major depressive disorder) Stable. He has good support in the community.   CAD (coronary artery disease) Following with cardiology. On aspirin and statin.   Chronic insomnia Uses xanax very sparingly as needed. Will refill today. Tolerating well without side effects.   Preventative Healthcare UpToDate on colonoscopy. UpToDate on flu vaccine.   He will come back in 1 year.     Subjective:  HPI:  See Assessment / plan for status of chronic conditions. Patient is here today for annual visit. He does need a refill on medications today. He had a upper respiratory infection (URI) several weeks ago.  Was given antibiotics and symptoms have improved mostly though is still having a lingering cough. This has been an intermittent issue for many years. He has tried a few OTC medications without much improvement. He does get occasional wheezing.    He did have labs are few days ago and has questions about this.   PMH:  The following were reviewed and entered/updated in  epic: Past Medical History:  Diagnosis Date   Anxiety    Arthritis of multiple joints    Basal cell carcinoma of right scalp    Benign prostatic hypertrophy    CAD (coronary artery disease)    Chronic anticoagulation 02/20/2016   Chronic lower back pain, disk disease    Depression    Diverticulosis of colon    Dysrhythmia    Factor VII deficiency (HCC)    10/28/15: No history of factor VII deficiency per Dr. Cyndie Chime   History of blood transfusion 1941   History of colon polyps    Hyperlipidemia    ITP (idiopathic thrombocytopenic purpura)    Kidney stones x 2, both passed    Myocardial infarction (HCC) 2009   Seasonal allergies    Squamous carcinoma left scalp    T-cell lymphoma (HCC)    "I'm in stage I; original site was on my left chest; had a place around my waist treated last summer; got a place on back of my left leg" (10/03/2012)   Patient Active Problem List   Diagnosis Date Noted   Factor VII deficiency (HCC)    Caregiver burden, of wife 12/21/2017   Bilateral sensorineural hearing loss, followed by ENT and audiology 12/21/2017   History of cutaneous T-cell lymphoma 12/21/2017   History of adenomatous polyp of colon, removed by Dr. Christella Hartigan 4/13 12/21/2017   Presbycusis of both ears 02/07/2016   Primary osteoarthritis of right knee 10/20/2015   SVT (supraventricular tachycardia) (HCC) 11/02/2012   Chronic insomnia 08/07/2012   History of MI (  myocardial infarction) 07/24/2011   History of SCC (squamous cell carcinoma) of skin 07/24/2011   MDD (major depressive disorder) 07/24/2011   Chronic ITP (idiopathic thrombocytopenia) (HCC) 06/14/2011   History of kidney stones 06/14/2011   CAD (coronary artery disease) 03/21/2011   Obstructive sleep apnea 09/14/2008   Cardiomyopathy, ischemic 05/16/2008   Essential hypertension 07/10/2007   Hyperlipidemia 11/18/2006   Allergic rhinitis, flonase prn 11/18/2006   Diverticulosis of colon, last colonoscopy 4/18 11/18/2006    Benign prostatic hyperplasia 11/18/2006   Osteoarthritis 11/18/2006   Past Surgical History:  Procedure Laterality Date   CARDIAC CATHETERIZATION  10/03/2012   CATARACT EXTRACTION W/ INTRAOCULAR LENS  IMPLANT, BILATERAL Bilateral 2000's   COLONOSCOPY     CORONARY ANGIOPLASTY WITH STENT PLACEMENT  05/29/2007   "1" (10/03/2012)   CYST EXCISION  1980's?   "or fatty tumor; taken off my back" (10/03/2012)   KNEE SURGERY     right   LEFT HEART CATHETERIZATION WITH CORONARY ANGIOGRAM N/A 10/03/2012   Procedure: LEFT HEART CATHETERIZATION WITH CORONARY ANGIOGRAM;  Surgeon: Kathleene Hazel, MD;  Location: [facility] CATH LAB;  Service: Cardiovascular;  Laterality: N/A;   LUMBAR EPIDURAL INJECTION  ~ 2004   MOHS SURGERY Bilateral 2011-2012   "top of my head" (10/03/2012)   PARTIAL KNEE ARTHROPLASTY Right 11/08/2015   Procedure: RIGHT UNICOMPARTMENTAL KNEE;  Surgeon: Sheral Apley, MD;  Location: [facility] OR;  Service: Orthopedics;  Laterality: Right;   TONSILLECTOMY  ~ 1945   VASECTOMY      Family History  Problem Relation Age of Onset   Alzheimer's disease Mother    Lymphoma Father    Coronary artery disease Brother    COPD Brother    Diabetes Brother    Colon cancer Paternal Uncle    Diabetes Brother     Medications- reviewed and updated Current Outpatient Medications  Medication Sig Dispense Refill   acetaminophen (TYLENOL) 650 MG CR tablet Take 650 mg by mouth 2 (two) times daily.     aspirin 81 MG tablet Take 81 mg by mouth daily.     atorvastatin (LIPITOR) 20 MG tablet Take 1 tablet (20 mg total) by mouth daily. 90 tablet 3   b complex vitamins capsule Take 1 capsule by mouth daily.     carvedilol (COREG) 3.125 MG tablet Take 1 tablet (3.125 mg total) by mouth 2 (two) times daily with a meal. 180 tablet 3   co-enzyme Q-10 30 MG capsule Take 20 mg by mouth daily.     ketoconazole 2%-triamcinolone 0.1% 1:2 cream mixture Apply topically 2 (two) times daily as needed. 45 g 1   losartan  (COZAAR) 50 MG tablet Take 1 tablet (50 mg total) by mouth daily. 90 tablet 3   nitroGLYCERIN (NITROSTAT) 0.4 MG SL tablet Place 1 tablet (0.4 mg total) under the tongue every 5 (five) minutes as needed for chest pain (for chest pain). 25 tablet 3   Omega-3 Fatty Acids (FISH OIL) 1000 MG CAPS Take 1,000 mg by mouth 2 (two) times daily.      Saw Palmetto 450 MG CAPS Take 450 mg by mouth daily.     VITAMIN D PO Take 1,000 mg by mouth daily.     ALPRAZolam (XANAX) 0.5 MG tablet Take 1 tablet (0.5 mg total) by mouth 2 (two) times daily as needed. 180 tablet 1   fluticasone (FLONASE) 50 MCG/ACT nasal spray USE 1 SPRAY IN EACH NOSTRILTWICE DAILY (Patient not taking: Reported on 05/31/2023) 48 g 0  ipratropium (ATROVENT) 0.03 % nasal spray Place 2 sprays into both nostrils every 12 (twelve) hours. (Patient not taking: Reported on 05/31/2023) 30 mL 12   No current facility-administered medications for this visit.    Allergies-reviewed and updated Allergies  Allergen Reactions   Other Other (See Comments)    ANTI-DEPRESSANTS. ANTI-DEPRESSANTS Antidepressants...has a variety of reactions to different antidepressants   Prozac [Fluoxetine Hcl] Other (See Comments)    nightmares   Oxycodone Other (See Comments)    unknown    Social History   Socioeconomic History   Marital status: Widowed    Spouse name: Not on file   Number of children: 2   Years of education: Not on file   Highest education level: Some college, no degree  Occupational History   Occupation: Retired    Comment: Airline pilot- Clinical cytogeneticist   Tobacco Use   Smoking status: Never   Smokeless tobacco: Never  Vaping Use   Vaping status: Never Used  Substance and Sexual Activity   Alcohol use: No    Alcohol/week: 0.0 standard drinks of alcohol   Drug use: No   Sexual activity: Not Currently  Other Topics Concern   Not on file  Social History Narrative   Wife is in a skilled facility- dementia    2 children- 1 locally and 1 in  [place]    Hobbies: golfing    Social Drivers of Corporate investment banker Strain: Low Risk  (05/30/2023)   Overall Financial Resource Strain (CARDIA)    Difficulty of Paying Living Expenses: Not hard at all  Food Insecurity: No Food Insecurity (05/30/2023)   Hunger Vital Sign    Worried About Running Out of Food in the Last Year: Never true    Ran Out of Food in the Last Year: Never true  Transportation Needs: No Transportation Needs (05/30/2023)   PRAPARE - Administrator, Civil Service (Medical): No    Lack of Transportation (Non-Medical): No  Physical Activity: Patient Declined (05/30/2023)   Exercise Vital Sign    Days of Exercise per Week: Patient declined    Minutes of Exercise per Session: Patient declined  Stress: No Stress Concern Present (05/30/2023)   [employer] of Occupational Health - Occupational Stress Questionnaire    Feeling of Stress : Only a little  Social Connections: Unknown (05/30/2023)   Social Connection and Isolation Panel [NHANES]    Frequency of Communication with Friends and Family: Three times a week    Frequency of Social Gatherings with Friends and Family: Twice a week    Attends Religious Services: Patient declined    Database administrator or Organizations: Yes    Attends Engineer, structural: More than 4 times per year    Marital Status: Widowed  Recent Concern: Social Connections - Moderately Isolated (05/06/2023)   Social Connection and Isolation Panel [NHANES]    Frequency of Communication with Friends and Family: More than three times a week    Frequency of Social Gatherings with Friends and Family: More than three times a week    Attends Religious Services: Never    Database administrator or Organizations: Yes    Attends Banker Meetings: 1 to 4 times per year    Marital Status: Widowed          Objective:  Physical Exam: BP 120/80 (BP Location: Left Arm, Patient Position: Sitting, Cuff Size: Normal)    Pulse (!) 101   Temp (!) 97.5  F (36.4 C) (Temporal)   Ht 5\' 5"  (1.651 m)   Wt 154 lb (69.9 kg)   SpO2 98%   BMI 25.63 kg/m   Gen: No acute distress, resting comfortably CV: Regular rate and rhythm with no murmurs appreciated Pulm: Normal work of breathing, clear to auscultation bilaterally with no crackles, wheezes, or rhonchi Neuro: Grossly normal, moves all extremities Psych: Normal affect and thought content  Time Spent: 45 minutes of total time was spent on the date of the encounter performing the following actions: chart review prior to seeing the patient, obtaining history, performing a medically necessary exam, counseling on the treatment plan, placing orders, and documenting in our EHR.        Katina Degree. [name], MD 05/31/2023 11:41 AM

## 2023-05-31 NOTE — Addendum Note (Signed)
Addended by: Ardith Dark on: 05/31/2023 12:57 PM   Modules accepted: Orders

## 2023-05-31 NOTE — Assessment & Plan Note (Signed)
Blood pressure at goal on coreg 3.125 mg twice daily and losartan 50 mg daily.

## 2023-05-31 NOTE — Assessment & Plan Note (Signed)
Uses xanax very sparingly as needed. Will refill today. Tolerating well without side effects.

## 2023-05-31 NOTE — Assessment & Plan Note (Signed)
Last lipid panel at goal on Lipitor 20 mg daily. Recheck in 6-12 months.

## 2023-05-31 NOTE — Assessment & Plan Note (Signed)
Follows with hematology. Last CBC at goal. Platelets stable.

## 2023-05-31 NOTE — Telephone Encounter (Signed)
See note

## 2023-05-31 NOTE — Assessment & Plan Note (Signed)
Stable. He has good support in the community.

## 2023-05-31 NOTE — Patient Instructions (Addendum)
It was very nice to see you today!  I will refill your xanax today.   We will refill you to see the pulmonologist.   Please continue to work on diet and exercise.   We can repeat your labs in a year.   Return in about 1 year (around 05/30/2024) for Annual Physical.   Take care, Dr [name]  PLEASE NOTE:  If you had any lab tests, please let us know if you have not heard back within a few days. You may see your results on mychart before we have a chance to review them but we will give you a call once they are reviewed by [place].   If we ordered any referrals today, please let us know if you have not heard from their office within the next week.   If you had any urgent prescriptions sent in today, please check with the pharmacy within an hour of our visit to make sure the prescription was transmitted appropriately.   Please try these tips to maintain a healthy lifestyle:  Eat at least 3 REAL meals and 1-2 snacks per day.  Aim for no more than 5 hours between eating.  If you eat breakfast, please do so within one hour of getting up.   Each meal should contain half fruits/vegetables, one quarter protein, and one quarter carbs (no bigger than a computer mouse)  Cut down on sweet beverages. This includes juice, soda, and sweet tea.   Drink at least 1 glass of water with each meal and aim for at least 8 glasses per day  Exercise at least 150 minutes every week.    Preventive Care 3 Years and Older, Male Preventive care refers to lifestyle choices and visits with your health care provider that can promote health and wellness. Preventive care visits are also called wellness exams. What can I expect for my preventive care visit? Counseling During your preventive care visit, your health care provider may ask about your: Medical history, including: Past medical problems. Family medical history. History of falls. Current health, including: Emotional well-being. Home life and relationship  well-being. Sexual activity. Memory and ability to understand (cognition). Lifestyle, including: Alcohol, nicotine or tobacco, and drug use. Access to firearms. Diet, exercise, and sleep habits. Work and work Astronomer. Sunscreen use. Safety issues such as seatbelt and bike helmet use. Physical exam Your health care provider will check your: Height and weight. These may be used to calculate your BMI (body mass index). BMI is a measurement that tells if you are at a healthy weight. Waist circumference. This measures the distance around your waistline. This measurement also tells if you are at a healthy weight and may help predict your risk of certain diseases, such as type 2 diabetes and high blood pressure. Heart rate and blood pressure. Body temperature. Skin for abnormal spots. What immunizations do I need?  Vaccines are usually given at various ages, according to a schedule. Your health care provider will recommend vaccines for you based on your age, medical history, and lifestyle or other factors, such as travel or where you work. What tests do I need? Screening Your health care provider may recommend screening tests for certain conditions. This may include: Lipid and cholesterol levels. Diabetes screening. This is done by checking your blood sugar (glucose) after you have not eaten for a while (fasting). Hepatitis C test. Hepatitis B test. HIV (human immunodeficiency virus) test. STI (sexually transmitted infection) testing, if you are at risk. Lung cancer screening. Colorectal  cancer screening. Prostate cancer screening. Abdominal aortic aneurysm (AAA) screening. You may need this if you are a current or former smoker. Talk with your health care provider about your test results, treatment options, and if necessary, the need for more tests. Follow these instructions at home: Eating and drinking  Eat a diet that includes fresh fruits and vegetables, whole grains, lean  protein, and low-fat dairy products. Limit your intake of foods with high amounts of sugar, saturated fats, and salt. Take vitamin and mineral supplements as recommended by your health care provider. Do not drink alcohol if your health care provider tells you not to drink. If you drink alcohol: Limit how much you have to 0-2 drinks a day. Know how much alcohol is in your drink. In the [place] one drink equals one 12 oz bottle of beer (355 mL), one 5 oz glass of wine (148 mL), or one 1 oz glass of hard liquor (44 mL). Lifestyle Brush your teeth every morning and night with fluoride toothpaste. Floss one time each day. Exercise for at least 30 minutes 5 or more days each week. Do not use any products that contain nicotine or tobacco. These products include cigarettes, chewing tobacco, and vaping devices, such as e-cigarettes. If you need help quitting, ask your health care provider. Do not use drugs. If you are sexually active, practice safe sex. Use a condom or other form of protection to prevent STIs. Take aspirin only as told by your health care provider. Make sure that you understand how much to take and what form to take. Work with your health care provider to find out whether it is safe and beneficial for you to take aspirin daily. Ask your health care provider if you need to take a cholesterol-lowering medicine (statin). Find healthy ways to manage stress, such as: Meditation, yoga, or listening to music. Journaling. Talking to a trusted person. Spending time with friends and family. Safety Always wear your seat belt while driving or riding in a vehicle. Do not drive: If you have been drinking alcohol. Do not ride with someone who has been drinking. When you are tired or distracted. While texting. If you have been using any mind-altering substances or drugs. Wear a helmet and other protective equipment during sports activities. If you have firearms in your house, make sure you follow  all gun safety procedures. Minimize exposure to UV radiation to reduce your risk of skin cancer. What's next? Visit your health care provider once a year for an annual wellness visit. Ask your health care provider how often you should have your eyes and teeth checked. Stay up to date on all vaccines. This information is not intended to replace advice given to you by your health care provider. Make sure you discuss any questions you have with your health care provider. Document Revised: 10/26/2020 Document Reviewed: 10/26/2020 Elsevier Patient Education  2024 [employer].

## 2023-06-04 NOTE — Telephone Encounter (Signed)
This was sent to mail order pharmacy last week. Can we make sure he has it?  Katina Degree. [name], MD 06/04/2023 10:55 AM

## 2023-06-25 DIAGNOSIS — J301 Allergic rhinitis due to pollen: Secondary | ICD-10-CM | POA: Diagnosis not present

## 2023-06-25 DIAGNOSIS — H1045 Other chronic allergic conjunctivitis: Secondary | ICD-10-CM | POA: Diagnosis not present

## 2023-06-25 DIAGNOSIS — J3089 Other allergic rhinitis: Secondary | ICD-10-CM | POA: Diagnosis not present

## 2023-06-25 DIAGNOSIS — R052 Subacute cough: Secondary | ICD-10-CM | POA: Diagnosis not present

## 2023-07-02 DIAGNOSIS — Z808 Family history of malignant neoplasm of other organs or systems: Secondary | ICD-10-CM | POA: Diagnosis not present

## 2023-07-02 DIAGNOSIS — L578 Other skin changes due to chronic exposure to nonionizing radiation: Secondary | ICD-10-CM | POA: Diagnosis not present

## 2023-07-02 DIAGNOSIS — B353 Tinea pedis: Secondary | ICD-10-CM | POA: Diagnosis not present

## 2023-07-02 DIAGNOSIS — L570 Actinic keratosis: Secondary | ICD-10-CM | POA: Diagnosis not present

## 2023-07-02 DIAGNOSIS — Z85828 Personal history of other malignant neoplasm of skin: Secondary | ICD-10-CM | POA: Diagnosis not present

## 2023-07-02 DIAGNOSIS — L821 Other seborrheic keratosis: Secondary | ICD-10-CM | POA: Diagnosis not present

## 2023-07-02 DIAGNOSIS — D225 Melanocytic nevi of trunk: Secondary | ICD-10-CM | POA: Diagnosis not present

## 2023-07-02 DIAGNOSIS — D2271 Melanocytic nevi of right lower limb, including hip: Secondary | ICD-10-CM | POA: Diagnosis not present

## 2023-07-02 DIAGNOSIS — L309 Dermatitis, unspecified: Secondary | ICD-10-CM | POA: Diagnosis not present

## 2023-10-02 ENCOUNTER — Other Ambulatory Visit: Payer: Self-pay | Admitting: Family Medicine

## 2023-12-20 DIAGNOSIS — C44529 Squamous cell carcinoma of skin of other part of trunk: Secondary | ICD-10-CM | POA: Diagnosis not present

## 2023-12-20 DIAGNOSIS — L570 Actinic keratosis: Secondary | ICD-10-CM | POA: Diagnosis not present

## 2023-12-20 DIAGNOSIS — C8400 Mycosis fungoides, unspecified site: Secondary | ICD-10-CM | POA: Diagnosis not present

## 2023-12-20 DIAGNOSIS — L578 Other skin changes due to chronic exposure to nonionizing radiation: Secondary | ICD-10-CM | POA: Diagnosis not present

## 2023-12-20 DIAGNOSIS — D225 Melanocytic nevi of trunk: Secondary | ICD-10-CM | POA: Diagnosis not present

## 2023-12-20 DIAGNOSIS — D2271 Melanocytic nevi of right lower limb, including hip: Secondary | ICD-10-CM | POA: Diagnosis not present

## 2023-12-20 DIAGNOSIS — Z808 Family history of malignant neoplasm of other organs or systems: Secondary | ICD-10-CM | POA: Diagnosis not present

## 2023-12-20 DIAGNOSIS — Z85828 Personal history of other malignant neoplasm of skin: Secondary | ICD-10-CM | POA: Diagnosis not present

## 2023-12-20 DIAGNOSIS — D485 Neoplasm of uncertain behavior of skin: Secondary | ICD-10-CM | POA: Diagnosis not present

## 2023-12-20 DIAGNOSIS — L821 Other seborrheic keratosis: Secondary | ICD-10-CM | POA: Diagnosis not present

## 2023-12-25 DIAGNOSIS — J301 Allergic rhinitis due to pollen: Secondary | ICD-10-CM | POA: Diagnosis not present

## 2023-12-25 DIAGNOSIS — J3089 Other allergic rhinitis: Secondary | ICD-10-CM | POA: Diagnosis not present

## 2023-12-25 DIAGNOSIS — R052 Subacute cough: Secondary | ICD-10-CM | POA: Diagnosis not present

## 2023-12-25 DIAGNOSIS — H1045 Other chronic allergic conjunctivitis: Secondary | ICD-10-CM | POA: Diagnosis not present

## 2024-01-01 DIAGNOSIS — H43813 Vitreous degeneration, bilateral: Secondary | ICD-10-CM | POA: Diagnosis not present

## 2024-01-01 DIAGNOSIS — H40013 Open angle with borderline findings, low risk, bilateral: Secondary | ICD-10-CM | POA: Diagnosis not present

## 2024-01-01 DIAGNOSIS — H524 Presbyopia: Secondary | ICD-10-CM | POA: Diagnosis not present

## 2024-01-01 DIAGNOSIS — Z961 Presence of intraocular lens: Secondary | ICD-10-CM | POA: Diagnosis not present

## 2024-01-01 DIAGNOSIS — H04123 Dry eye syndrome of bilateral lacrimal glands: Secondary | ICD-10-CM | POA: Diagnosis not present

## 2024-01-01 DIAGNOSIS — H52202 Unspecified astigmatism, left eye: Secondary | ICD-10-CM | POA: Diagnosis not present

## 2024-01-01 DIAGNOSIS — H35373 Puckering of macula, bilateral: Secondary | ICD-10-CM | POA: Diagnosis not present

## 2024-01-07 ENCOUNTER — Telehealth: Payer: Self-pay

## 2024-01-07 NOTE — Telephone Encounter (Signed)
 Ok with me. Please place any necessary orders.

## 2024-01-07 NOTE — Telephone Encounter (Signed)
 Left message for patient to return call.

## 2024-01-07 NOTE — Telephone Encounter (Signed)
 Copied from CRM #[id number]. Topic: Clinical - Lab/Test Results >> Jan 06, 2024 10:15 AM Emylou G wrote: Reason for CRM: Patient would like labs ordered/appt schedule for his phys come January

## 2024-01-08 ENCOUNTER — Other Ambulatory Visit: Payer: Self-pay | Admitting: *Deleted

## 2024-01-08 DIAGNOSIS — R17 Unspecified jaundice: Secondary | ICD-10-CM

## 2024-01-08 DIAGNOSIS — R739 Hyperglycemia, unspecified: Secondary | ICD-10-CM

## 2024-01-08 DIAGNOSIS — N400 Enlarged prostate without lower urinary tract symptoms: Secondary | ICD-10-CM

## 2024-01-08 DIAGNOSIS — E7800 Pure hypercholesterolemia, unspecified: Secondary | ICD-10-CM

## 2024-01-08 DIAGNOSIS — I10 Essential (primary) hypertension: Secondary | ICD-10-CM

## 2024-01-08 DIAGNOSIS — Z0000 Encounter for general adult medical examination without abnormal findings: Secondary | ICD-10-CM

## 2024-01-08 NOTE — Telephone Encounter (Signed)
 Lab/CPE appt schedule  Future lab order

## 2024-01-11 ENCOUNTER — Other Ambulatory Visit: Payer: Self-pay | Admitting: Cardiovascular Disease

## 2024-01-11 DIAGNOSIS — I2510 Atherosclerotic heart disease of native coronary artery without angina pectoris: Secondary | ICD-10-CM

## 2024-01-19 ENCOUNTER — Other Ambulatory Visit: Payer: Self-pay | Admitting: Cardiovascular Disease

## 2024-01-19 DIAGNOSIS — E7800 Pure hypercholesterolemia, unspecified: Secondary | ICD-10-CM

## 2024-02-11 DIAGNOSIS — Z23 Encounter for immunization: Secondary | ICD-10-CM | POA: Diagnosis not present

## 2024-02-27 ENCOUNTER — Other Ambulatory Visit: Payer: Self-pay | Admitting: *Deleted

## 2024-02-27 DIAGNOSIS — D693 Immune thrombocytopenic purpura: Secondary | ICD-10-CM

## 2024-03-06 ENCOUNTER — Other Ambulatory Visit: Payer: Medicare Other

## 2024-03-06 ENCOUNTER — Inpatient Hospital Stay: Payer: Medicare Other | Attending: Oncology | Admitting: Oncology

## 2024-03-06 ENCOUNTER — Inpatient Hospital Stay: Payer: Self-pay

## 2024-03-06 VITALS — BP 137/86 | HR 60 | Temp 97.8°F | Resp 18 | Ht 65.0 in | Wt 153.3 lb

## 2024-03-06 DIAGNOSIS — D693 Immune thrombocytopenic purpura: Secondary | ICD-10-CM

## 2024-03-06 DIAGNOSIS — I2510 Atherosclerotic heart disease of native coronary artery without angina pectoris: Secondary | ICD-10-CM | POA: Diagnosis not present

## 2024-03-06 DIAGNOSIS — E785 Hyperlipidemia, unspecified: Secondary | ICD-10-CM | POA: Insufficient documentation

## 2024-03-06 DIAGNOSIS — C84AA Cutaneous t-cell lymphoma, unspecified, in remission: Secondary | ICD-10-CM | POA: Insufficient documentation

## 2024-03-06 DIAGNOSIS — I10 Essential (primary) hypertension: Secondary | ICD-10-CM | POA: Diagnosis not present

## 2024-03-06 DIAGNOSIS — Z85828 Personal history of other malignant neoplasm of skin: Secondary | ICD-10-CM | POA: Insufficient documentation

## 2024-03-06 DIAGNOSIS — I252 Old myocardial infarction: Secondary | ICD-10-CM | POA: Diagnosis not present

## 2024-03-06 LAB — CBC WITH DIFFERENTIAL (CANCER CENTER ONLY)
Abs Immature Granulocytes: 0.01 K/uL (ref 0.00–0.07)
Basophils Absolute: 0 K/uL (ref 0.0–0.1)
Basophils Relative: 1 %
Eosinophils Absolute: 0.2 K/uL (ref 0.0–0.5)
Eosinophils Relative: 3 %
HCT: 47.5 % (ref 39.0–52.0)
Hemoglobin: 15.7 g/dL (ref 13.0–17.0)
Immature Granulocytes: 0 %
Lymphocytes Relative: 31 %
Lymphs Abs: 1.7 K/uL (ref 0.7–4.0)
MCH: 32.8 pg (ref 26.0–34.0)
MCHC: 33.1 g/dL (ref 30.0–36.0)
MCV: 99.4 fL (ref 80.0–100.0)
Monocytes Absolute: 0.5 K/uL (ref 0.1–1.0)
Monocytes Relative: 10 %
Neutro Abs: 2.9 K/uL (ref 1.7–7.7)
Neutrophils Relative %: 55 %
Platelet Count: 118 K/uL — ABNORMAL LOW (ref 150–400)
RBC: 4.78 MIL/uL (ref 4.22–5.81)
RDW: 12.8 % (ref 11.5–15.5)
WBC Count: 5.3 K/uL (ref 4.0–10.5)
nRBC: 0 % (ref 0.0–0.2)

## 2024-03-06 NOTE — Progress Notes (Signed)
   [facility] OFFICE PROGRESS NOTE   Diagnosis: ITP, history of T-cell lymphoma  INTERVAL HISTORY:   [name] returns as scheduled.  He generally feels well.  Good appetite.  He reports a decreased energy level for most of his life.  No fever or night sweats.  No rash.  He had an episode of cold legs below the knee 2 nights ago lasting for minutes. He had a squamous cell carcinoma removed from the mid abdomen on 12/20/2023.  He is scheduled for repeat surgery. Objective:  Vital signs in last 24 hours:  Blood pressure 137/86, pulse 60, temperature 97.8 F (36.6 C), temperature source Temporal, resp. rate 18, height 5' 5 (1.651 m), weight 153 lb 4.8 oz (69.5 kg), SpO2 100%.    HEENT: Oropharynx without visible mass, slight prominence of the right compared to the left submandibular gland Lymphatics: No cervical, supraclavicular, axillary, or inguinal nodes Resp: Lungs clear bilaterally Cardio: Regular rate and rhythm GI: No hepatosplenomegaly Vascular: No leg edema, diminished bilateral femoral and right dorsal pedis pulse   Lab Results:  Lab Results  Component Value Date   WBC 5.3 03/06/2024   HGB 15.7 03/06/2024   HCT 47.5 03/06/2024   MCV 99.4 03/06/2024   PLT 118 (L) 03/06/2024   NEUTROABS 2.9 03/06/2024    CMP  Lab Results  Component Value Date   NA 140 05/27/2023   K 4.6 05/27/2023   CL 105 05/27/2023   CO2 27 05/27/2023   GLUCOSE 108 (H) 05/27/2023   BUN 11 05/27/2023   CREATININE 0.84 05/27/2023   CALCIUM  9.3 05/27/2023   PROT 6.5 05/27/2023   ALBUMIN 4.4 05/27/2023   AST 22 05/27/2023   ALT 20 05/27/2023   ALKPHOS 73 05/27/2023   BILITOT 1.5 (H) 05/27/2023   BILITOT 1.5 (H) 05/27/2023   GFRNONAA >60 10/29/2021   GFRAA >60 12/04/2019    Medications: I have reviewed the patient's current medications.   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  He is stable from a hematologic standpoint.  He has a remote history of cutaneous T-cell lymphoma.  He is in clinical remission.  He will continue follow-up in the oncology clinic.  [name] will return for an office and lab visit in 1 year.  I recommended he remain up-to-date on the pneumonia vaccine.  He will see cardiology in a few weeks.  He will discuss the cold legs and diminished pulses with his cardiologist.  Arley Hof, MD  03/06/2024  11:07 AM

## 2024-03-17 DIAGNOSIS — D045 Carcinoma in situ of skin of trunk: Secondary | ICD-10-CM | POA: Diagnosis not present

## 2024-03-19 ENCOUNTER — Encounter: Payer: Self-pay | Admitting: Cardiovascular Disease

## 2024-03-19 ENCOUNTER — Ambulatory Visit: Attending: Cardiovascular Disease | Admitting: Cardiovascular Disease

## 2024-03-19 VITALS — BP 147/75 | HR 63 | Resp 16 | Ht 65.0 in | Wt 149.6 lb

## 2024-03-19 DIAGNOSIS — E7800 Pure hypercholesterolemia, unspecified: Secondary | ICD-10-CM | POA: Insufficient documentation

## 2024-03-19 DIAGNOSIS — I351 Nonrheumatic aortic (valve) insufficiency: Secondary | ICD-10-CM | POA: Insufficient documentation

## 2024-03-19 DIAGNOSIS — I10 Essential (primary) hypertension: Secondary | ICD-10-CM | POA: Insufficient documentation

## 2024-03-19 DIAGNOSIS — M79605 Pain in left leg: Secondary | ICD-10-CM | POA: Insufficient documentation

## 2024-03-19 DIAGNOSIS — M79604 Pain in right leg: Secondary | ICD-10-CM | POA: Diagnosis not present

## 2024-03-19 DIAGNOSIS — I2510 Atherosclerotic heart disease of native coronary artery without angina pectoris: Secondary | ICD-10-CM | POA: Insufficient documentation

## 2024-03-19 NOTE — Progress Notes (Signed)
 Chief Complaint  Patient presents with   Coronary artery disease involving native coronary artery of   Follow-up    1 year   History of Present Illness: 83 yo male with history of CAD, HTN, hyperlipidemia, idiopathic thrombocytopenia, T cell lymphoma and sleep apnea who is here today for cardiac follow up. He had an anterior STEMI in January 2009 and had a drug eluting stent placed in the LAD. Cardiac cath May 2014 with mild to moderate CAD. Echo January 2023 with LVEF=50-55%. Trivial AI, MR.   He is here today for follow up. The patient denies any chest pain, dyspnea, palpitations, lower extremity edema, orthopnea, PND, dizziness, near syncope or syncope. He has some leg pain when walking but no cramping. His legs feel cold at night.   Primary Care Physician: Kennyth Worth HERO, MD  Past Medical History:  Diagnosis Date   Anxiety    Arthritis of multiple joints    Basal cell carcinoma of right scalp    Benign prostatic hypertrophy    CAD (coronary artery disease)    Chronic anticoagulation 02/20/2016   Chronic lower back pain, disk disease    Depression    Diverticulosis of colon    Dysrhythmia    Factor VII deficiency (HCC)    10/28/15: No history of factor VII deficiency per Dr. Freddie   History of blood transfusion 1941   History of colon polyps    Hyperlipidemia    ITP (idiopathic thrombocytopenic purpura)    Kidney stones x 2, both passed    Myocardial infarction (HCC) 2009   Seasonal allergies    Squamous carcinoma left scalp    T-cell lymphoma (HCC)    I'm in stage I; original site was on my left chest; had a place around my waist treated last summer; got a place on back of my left leg (10/03/2012)    Past Surgical History:  Procedure Laterality Date   CARDIAC CATHETERIZATION  10/03/2012   CATARACT EXTRACTION W/ INTRAOCULAR LENS  IMPLANT, BILATERAL Bilateral 2000's   COLONOSCOPY     CORONARY ANGIOPLASTY WITH STENT PLACEMENT  05/29/2007   1 (10/03/2012)   CYST  EXCISION  1980's?   or fatty tumor; taken off my back (10/03/2012)   KNEE SURGERY     right   LEFT HEART CATHETERIZATION WITH CORONARY ANGIOGRAM N/A 10/03/2012   Procedure: LEFT HEART CATHETERIZATION WITH CORONARY ANGIOGRAM;  Surgeon: Lonni JONETTA Cash, MD;  Location[facility] C[facility] LAB;  Service: Cardiovascular;  Laterality: N/A;   LUMBAR EPIDURAL INJECTION  ~ 2004   MOHS SURGERY Bilateral 2011-2012   top of my head (10/03/2012)   PARTIAL KNEE ARTHROPLASTY Right 11/08/2015   Procedure: RIGHT UNICOMPARTMENTAL KNEE;  Surgeon: Evalene JONETTA Chancy, MD;  Location: [facility] OR;  Service: Orthopedics;  Laterality: Right;   TONSILLECTOMY  ~ 1945   VASECTOMY      Current Outpatient Medications  Medication Sig Dispense Refill   acetaminophen  (TYLENOL ) 650 MG CR tablet Take 650 mg by mouth 2 (two) times daily.     ALPRAZolam  (XANAX ) 0.5 MG tablet Take 1 tablet (0.5 mg total) by mouth 2 (two) times daily as needed. 180 tablet 1   aspirin  81 MG tablet Take 81 mg by mouth daily.     atorvastatin  (LIPITOR) 20 MG tablet TAKE 1 TABLET DAILY 90 tablet 0   b complex vitamins capsule Take 1 capsule by mouth daily.     carvedilol  (COREG ) 3.125 MG tablet TAKE 1 TABLET TWICE A DAY  WITH  MEALS 180 tablet 0   co-enzyme Q-10 30 MG capsule Take 20 mg by mouth daily.     fluticasone  (FLONASE ) 50 MCG/ACT nasal spray USE 1 SPRAY IN EACH NOSTRILTWICE DAILY 48 g 0   losartan  (COZAAR ) 50 MG tablet TAKE 1 TABLET DAILY 90 tablet 0   montelukast (SINGULAIR) 10 MG tablet Take 10 mg by mouth daily.     nitroGLYCERIN  (NITROSTAT ) 0.4 MG SL tablet Place 1 tablet (0.4 mg total) under the tongue every 5 (five) minutes as needed for chest pain (for chest pain). 25 tablet 3   Omega-3 Fatty Acids (FISH OIL) 1000 MG CAPS Take 1,000 mg by mouth 2 (two) times daily.      Saw Palmetto 450 MG CAPS Take 450 mg by mouth daily.     VITAMIN D PO Take 1,000 mg by mouth daily.     No current facility-administered medications for this visit.     Allergies  Allergen Reactions   Other Other (See Comments)    ANTI-DEPRESSANTS. ANTI-DEPRESSANTS Antidepressants...has a variety of reactions to different antidepressants   Prozac [Fluoxetine Hcl] Other (See Comments)    nightmares   Oxycodone  Other (See Comments)    unknown    Social History   Socioeconomic History   Marital status: Widowed    Spouse name: Not on file   Number of children: 2   Years of education: Not on file   Highest education level: Some college, no degree  Occupational History   Occupation: Retired    Comment: Airline Pilot- Clinical Cytogeneticist   Tobacco Use   Smoking status: Never   Smokeless tobacco: Never  Vaping Use   Vaping status: Never Used  Substance and Sexual Activity   Alcohol use: No    Alcohol/week: 0.0 standard drinks of alcohol   Drug use: No   Sexual activity: Not Currently  Other Topics Concern   Not on file  Social History Narrative   Wife is in a skilled facility- dementia    2 children- 1 locally and 1 in [place]    Hobbies: golfing    Social Drivers of Corporate Investment Banker Strain: Low Risk  (05/30/2023)   Overall Financial Resource Strain (CARDIA)    Difficulty of Paying Living Expenses: Not hard at all  Food Insecurity: No Food Insecurity (05/30/2023)   Hunger Vital Sign    Worried About Running Out of Food in the Last Year: Never true    Ran Out of Food in the Last Year: Never true  Transportation Needs: No Transportation Needs (05/30/2023)   PRAPARE - Administrator, Civil Service (Medical): No    Lack of Transportation (Non-Medical): No  Physical Activity: Patient Declined (05/30/2023)   Exercise Vital Sign    Days of Exercise per Week: Patient declined    Minutes of Exercise per Session: Patient declined  Stress: No Stress Concern Present (05/30/2023)   [employer] of Occupational Health - Occupational Stress Questionnaire    Feeling of Stress : Only a little  Social Connections: Unknown (05/30/2023)    Social Connection and Isolation Panel    Frequency of Communication with Friends and Family: Three times a week    Frequency of Social Gatherings with Friends and Family: Twice a week    Attends Religious Services: Patient declined    Database Administrator or Organizations: Yes    Attends Engineer, Structural: More than 4 times per year    Marital Status: Widowed  Recent Concern:  Social Connections - Moderately Isolated (05/06/2023)   Social Connection and Isolation Panel    Frequency of Communication with Friends and Family: More than three times a week    Frequency of Social Gatherings with Friends and Family: More than three times a week    Attends Religious Services: Never    Database Administrator or Organizations: Yes    Attends Banker Meetings: 1 to 4 times per year    Marital Status: Widowed  Intimate Partner Violence: Not At Risk (05/06/2023)   Humiliation, Afraid, Rape, and Kick questionnaire    Fear of Current or Ex-Partner: No    Emotionally Abused: No    Physically Abused: No    Sexually Abused: No    Family History  Problem Relation Age of Onset   Alzheimer's disease Mother    Lymphoma Father    Coronary artery disease Brother    COPD Brother    Diabetes Brother    Colon cancer Paternal Uncle    Diabetes Brother     Review of Systems:  As stated in the HPI and otherwise negative.   BP (!) 147/75 (BP Location: Left Arm, Patient Position: Sitting, Cuff Size: Normal)   Pulse 63   Resp 16   Ht 5' 5 (1.651 m)   Wt 149 lb 9.6 oz (67.9 kg)   SpO2 97%   BMI 24.89 kg/m   Physical Examination: General: Well developed, well nourished, NAD  HEENT: OP clear, mucus membranes moist  SKIN: warm, dry. No rashes. Neuro: No focal deficits  Musculoskeletal: Muscle strength 5/5 all ext  Psychiatric: Mood and affect normal  Neck: No JVD, no carotid bruits, no thyromegaly, no lymphadenopathy.  Lungs:Clear bilaterally, no wheezes, rhonci,  crackles Cardiovascular: Regular rate and rhythm. No murmurs, gallops or rubs. Abdomen:Soft. Bowel sounds present. Non-tender.  Extremities: No lower extremity edema. Pulses are 2 + in the bilateral DP/PT.  EKG:  EKG is ordered today. The ekg ordered today demonstrates  EKG Interpretation Date/Time:  Thursday March 19 2024 10:02:39 EST Ventricular Rate:  57 PR Interval:  162 QRS Duration:  80 QT Interval:  392 QTC Calculation: 381 R Axis:   32  Text Interpretation: Sinus bradycardia Poor R wave progression Non-specific T wave abnormality Confirmed by Verlin Bruckner [phone]) on 03/19/2024 10:16:40 AM   Recent Labs: 05/27/2023: ALT 20; BUN 11; Creatinine, Ser 0.84; Potassium 4.6; Sodium 140; TSH 2.63 03/06/2024: Hemoglobin 15.7; Platelet Count 118   Lipid Panel Lipid Panel     Component Value Date/Time   CHOL 147 05/27/2023 1203   TRIG 94.0 05/27/2023 1203   HDL 68.30 05/27/2023 1203   CHOLHDL 2 05/27/2023 1203   VLDL 18.8 05/27/2023 1203   LDLCALC 59 05/27/2023 1203   LDLDIRECT 137.3 08/08/2006 1059   Wt Readings from Last 3 Encounters:  03/19/24 149 lb 9.6 oz (67.9 kg)  03/06/24 153 lb 4.8 oz (69.5 kg)  05/31/23 154 lb (69.9 kg)    Assessment and Plan:   1. CAD without angina: No chest pain. LV function normal by echo in 2023. Continue ASA, beta blocker and statin.   2. HTN: BP is controlled at home. He has his home blood pressure with him today. No changes today  3. Hyperlipidemia: LDL at goal in January 2025. Continue statin.   4. Aortic insufficiency: Mild by echo in 2023. No loud murmur on exam. Will discuss repeating his echo next year.   5. Leg pain: He has some leg pain when walking  but no cramping. His legs feel cold at night. Will arrange LE arterial dopplers/ABI  Labs/ tests ordered today include:   Orders Placed This Encounter  Procedures   EKG 12-Lead   VAS US  ABI WITH/WO TBI   VAS US  LOWER EXTREMITY ARTERIAL DUPLEX   Disposition:   F/U  with me in 12 months  Signed, Lonni Cash, MD 03/19/2024 10:43 AM    [facility] [address] [place], [place], [place]  [address] Phone: [phone]; Fax: [phone]

## 2024-03-19 NOTE — Patient Instructions (Signed)
 Medication Instructions:  Your physician recommends that you continue on your current medications as directed. Please refer to the Current Medication list given to you today.  *If you need a refill on your cardiac medications before your next appointment, please call your pharmacy*  Lab Work: none If you have labs (blood work) drawn today and your tests are completely normal, you will receive your results only by: MyChart Message (if you have MyChart) OR A paper copy in the mail If you have any lab test that is abnormal or we need to change your treatment, we will call you to review the results.  Testing/Procedures: Your physician has requested that you have a lower extremity arterial duplex with ABIs. This test is an ultrasound of the arteries in the legs  It looks at arterial blood flow in the legs Allow one hour for Lower and Arterial scans. There are no restrictions or special instructions.  Please note: We ask at that you not bring children with you during ultrasound (echo/ vascular) testing. Due to room size and safety concerns, children are not allowed in the ultrasound rooms during exams. Our front office staff cannot provide observation of children in our lobby area while testing is being conducted. An adult accompanying a patient to their appointment will only be allowed in the ultrasound room at the discretion of the ultrasound technician under special circumstances. We apologize for any inconvenience.   Follow-Up: At [facility], you and your health needs are our priority.  As part of our continuing mission to provide you with exceptional heart care, our providers are all part of one team.  This team includes your primary Cardiologist (physician) and Advanced Practice Providers or APPs (Physician Assistants and Nurse Practitioners) who all work together to provide you with the care you need, when you need it.  Your next appointment:   12 month(s)  Provider:   Lonni Cash, MD    We recommend signing up for the patient portal called MyChart.  Sign up information is provided on this After Visit Summary.  MyChart is used to connect with patients for Virtual Visits (Telemedicine).  Patients are able to view lab/test results, encounter notes, upcoming appointments, etc.  Non-urgent messages can be sent to your provider as well.   To learn more about what you can do with MyChart, go to [email or web address].   Other Instructions

## 2024-03-26 ENCOUNTER — Encounter: Payer: Self-pay | Admitting: Cardiovascular Disease

## 2024-03-30 ENCOUNTER — Ambulatory Visit (HOSPITAL_COMMUNITY)

## 2024-03-31 ENCOUNTER — Ambulatory Visit (HOSPITAL_COMMUNITY)

## 2024-04-01 ENCOUNTER — Encounter (HOSPITAL_COMMUNITY): Payer: Self-pay

## 2024-04-01 ENCOUNTER — Ambulatory Visit (HOSPITAL_COMMUNITY)
Admission: RE | Admit: 2024-04-01 | Discharge: 2024-04-01 | Attending: Cardiovascular Disease | Admitting: Cardiovascular Disease

## 2024-04-01 ENCOUNTER — Ambulatory Visit (HOSPITAL_COMMUNITY)
Admission: RE | Admit: 2024-04-01 | Discharge: 2024-04-01 | Disposition: A | Discharge status: Home / Self Care | Source: Ambulatory Visit | Attending: Cardiovascular Disease | Admitting: Cardiovascular Disease

## 2024-04-01 DIAGNOSIS — M79604 Pain in right leg: Secondary | ICD-10-CM | POA: Insufficient documentation

## 2024-04-01 DIAGNOSIS — M79605 Pain in left leg: Secondary | ICD-10-CM | POA: Diagnosis not present

## 2024-04-01 LAB — VAS US ABI WITH/WO TBI

## 2024-04-02 ENCOUNTER — Ambulatory Visit: Payer: Self-pay | Admitting: Cardiovascular Disease

## 2024-04-02 ENCOUNTER — Other Ambulatory Visit: Payer: Self-pay | Admitting: Family Medicine

## 2024-04-08 ENCOUNTER — Other Ambulatory Visit: Payer: Self-pay | Admitting: Cardiovascular Disease

## 2024-04-08 DIAGNOSIS — I2510 Atherosclerotic heart disease of native coronary artery without angina pectoris: Secondary | ICD-10-CM

## 2024-04-08 DIAGNOSIS — E7800 Pure hypercholesterolemia, unspecified: Secondary | ICD-10-CM

## 2024-05-25 ENCOUNTER — Other Ambulatory Visit (INDEPENDENT_AMBULATORY_CARE_PROVIDER_SITE_OTHER)

## 2024-05-25 DIAGNOSIS — N400 Enlarged prostate without lower urinary tract symptoms: Secondary | ICD-10-CM | POA: Diagnosis not present

## 2024-05-25 DIAGNOSIS — Z0000 Encounter for general adult medical examination without abnormal findings: Secondary | ICD-10-CM

## 2024-05-25 DIAGNOSIS — R739 Hyperglycemia, unspecified: Secondary | ICD-10-CM | POA: Diagnosis not present

## 2024-05-25 DIAGNOSIS — E7800 Pure hypercholesterolemia, unspecified: Secondary | ICD-10-CM

## 2024-05-25 DIAGNOSIS — R17 Unspecified jaundice: Secondary | ICD-10-CM

## 2024-05-25 DIAGNOSIS — I10 Essential (primary) hypertension: Secondary | ICD-10-CM | POA: Diagnosis not present

## 2024-05-25 LAB — COMPREHENSIVE METABOLIC PANEL WITH GFR
ALT: 22 U/L (ref 3–53)
AST: 21 U/L (ref 5–37)
Albumin: 4 g/dL (ref 3.5–5.2)
Alkaline Phosphatase: 62 U/L (ref 39–117)
BUN: 19 mg/dL (ref 6–23)
CO2: 29 meq/L (ref 19–32)
Calcium: 8.9 mg/dL (ref 8.4–10.5)
Chloride: 104 meq/L (ref 96–112)
Creatinine, Ser: 0.97 mg/dL (ref 0.40–1.50)
GFR: 71.13 mL/min
Glucose, Bld: 82 mg/dL (ref 70–99)
Potassium: 3.6 meq/L (ref 3.5–5.1)
Sodium: 141 meq/L (ref 135–145)
Total Bilirubin: 1.3 mg/dL — ABNORMAL HIGH (ref 0.2–1.2)
Total Protein: 6 g/dL (ref 6.0–8.3)

## 2024-05-25 LAB — LIPID PANEL
Cholesterol: 116 mg/dL (ref 28–200)
HDL: 57.2 mg/dL
LDL Cholesterol: 44 mg/dL (ref 10–99)
NonHDL: 59
Total CHOL/HDL Ratio: 2
Triglycerides: 73 mg/dL (ref 10.0–149.0)
VLDL: 14.6 mg/dL (ref 0.0–40.0)

## 2024-05-25 LAB — PSA: PSA: 1.8 ng/mL (ref 0.10–4.00)

## 2024-05-25 LAB — TSH: TSH: 1.68 u[IU]/mL (ref 0.35–5.50)

## 2024-05-25 LAB — CBC
HCT: 45.5 % (ref 39.0–52.0)
Hemoglobin: 15.4 g/dL (ref 13.0–17.0)
MCHC: 33.9 g/dL (ref 30.0–36.0)
MCV: 98.5 fl (ref 78.0–100.0)
Platelets: 114 K/uL — ABNORMAL LOW (ref 150.0–400.0)
RBC: 4.62 Mil/uL (ref 4.22–5.81)
RDW: 13 % (ref 11.5–15.5)
WBC: 4.2 K/uL (ref 4.0–10.5)

## 2024-05-25 LAB — HEMOGLOBIN A1C: Hgb A1c MFr Bld: 5.9 % (ref 4.6–6.5)

## 2024-05-26 ENCOUNTER — Ambulatory Visit: Payer: Self-pay | Admitting: Family Medicine

## 2024-05-26 LAB — BILIRUBIN, FRACTIONATED(TOT/DIR/INDIR)
Bilirubin, Direct: 0.3 mg/dL — ABNORMAL HIGH (ref 0.0–0.2)
Indirect Bilirubin: 0.9 mg/dL (ref 0.2–1.2)
Total Bilirubin: 1.2 mg/dL (ref 0.2–1.2)

## 2024-05-26 NOTE — Progress Notes (Signed)
 Labs are all stable.  We can discuss more at his upcoming office visit.

## 2024-06-04 ENCOUNTER — Ambulatory Visit (INDEPENDENT_AMBULATORY_CARE_PROVIDER_SITE_OTHER): Payer: Medicare Other | Admitting: Family Medicine

## 2024-06-04 ENCOUNTER — Encounter: Payer: Self-pay | Admitting: Family Medicine

## 2024-06-04 VITALS — BP 138/82 | HR 62 | Temp 97.1°F | Ht 65.0 in | Wt 152.6 lb

## 2024-06-04 DIAGNOSIS — I471 Supraventricular tachycardia, unspecified: Secondary | ICD-10-CM

## 2024-06-04 DIAGNOSIS — N400 Enlarged prostate without lower urinary tract symptoms: Secondary | ICD-10-CM | POA: Diagnosis not present

## 2024-06-04 DIAGNOSIS — I10 Essential (primary) hypertension: Secondary | ICD-10-CM | POA: Diagnosis not present

## 2024-06-04 DIAGNOSIS — K5900 Constipation, unspecified: Secondary | ICD-10-CM | POA: Diagnosis not present

## 2024-06-04 DIAGNOSIS — F5104 Psychophysiologic insomnia: Secondary | ICD-10-CM

## 2024-06-04 DIAGNOSIS — D693 Immune thrombocytopenic purpura: Secondary | ICD-10-CM | POA: Diagnosis not present

## 2024-06-04 DIAGNOSIS — F331 Major depressive disorder, recurrent, moderate: Secondary | ICD-10-CM | POA: Diagnosis not present

## 2024-06-04 DIAGNOSIS — E7800 Pure hypercholesterolemia, unspecified: Secondary | ICD-10-CM

## 2024-06-04 DIAGNOSIS — M150 Primary generalized (osteo)arthritis: Secondary | ICD-10-CM

## 2024-06-04 DIAGNOSIS — F339 Major depressive disorder, recurrent, unspecified: Secondary | ICD-10-CM

## 2024-06-04 DIAGNOSIS — K8021 Calculus of gallbladder without cholecystitis with obstruction: Secondary | ICD-10-CM | POA: Diagnosis not present

## 2024-06-04 MED ORDER — ALPRAZOLAM 0.5 MG PO TABS: 0.5000 mg | ORAL_TABLET | Freq: Two times a day (BID) | ORAL | 1 refills | Status: AC | PRN

## 2024-06-04 MED ORDER — TRIAMCINOLONE ACETONIDE 0.1 % EX CREA: TOPICAL_CREAM | Freq: Every day | CUTANEOUS | 0 refills | Status: AC

## 2024-06-04 NOTE — Assessment & Plan Note (Signed)
 Blood pressure at goal today on coreg  3.125 twice daily and losartan  50 mg daily.

## 2024-06-04 NOTE — Assessment & Plan Note (Signed)
 Still having some back pain but this is manageable. We did discuss having him follow up with physical therapy but he declined. He will let us  know if he changes his mind.

## 2024-06-04 NOTE — Assessment & Plan Note (Signed)
 Having mild lower urinary tract symptoms including nocturia but this is manageable. We did discuss trial of Flomax however he declined. He will let us  know if he changes mind.

## 2024-06-04 NOTE — Assessment & Plan Note (Signed)
 Noted on CT scan in 2012. Asymptomatic. Discussed reasons to seek care but do not need to do any further work up for this at this point.

## 2024-06-04 NOTE — Assessment & Plan Note (Signed)
 Last lipid panel at goal on Lipitor 20 mg daily. Recheck 6-12 months.

## 2024-06-04 NOTE — Assessment & Plan Note (Signed)
 Stable on alprazolam  as needed sparingly. Will refill today.

## 2024-06-04 NOTE — Patient Instructions (Signed)
 It was very nice to see you today!  VISIT SUMMARY: During your visit, we discussed your gastrointestinal issues, protein intake, sleepiness, bladder issues, arthritis, and overall health management. We reviewed your current medications and made some adjustments to improve your symptoms.  YOUR PLAN: CHRONIC CONSTIPATION: You experience intermittent constipation with occasional diarrhea, likely due to dietary habits and low fiber intake. -Continue using Miralax chewables as needed. -Increase dietary fiber with Metamucil or Benefiber. -Increase water intake to at least two bottles per day.  BENIGN PROSTATIC HYPERPLASIA: You have mild urinary symptoms that are managed with your current medication. -Continue your current medication regimen.  PRIMARY OSTEOARTHRITIS INVOLVING MULTIPLE JOINTS: You have chronic osteoarthritis with significant lower back pain, worsened by activity. -Continue your home exercise program from physical therapy.  ESSENTIAL HYPERTENSION: Your hypertension is managed with your current medication. -We discussed the side effects of beta blockers, including fatigue and lightheadedness. -Consider reducing your Coreg  dosage temporarily to assess fatigue, pending consultation with your cardiologist.  PURE HYPERCHOLESTEROLEMIA: Your cholesterol levels are well-controlled with your current management. -Continue your current management.  ASYMPTOMATIC GALLSTONE: A gallstone identified in 2012 remains asymptomatic with no complications. -No intervention is necessary unless symptoms develop.  Return in about 1 year (around 06/04/2025) for Annual Physical.   Take care, Dr Kennyth  PLEASE NOTE:  If you had any lab tests, please let us  know if you have not heard back within a few days. You may see your results on mychart before we have a chance to review them but we will give you a call once they are reviewed by us .   If we ordered any referrals today, please let us  know if you  have not heard from their office within the next week.   If you had any urgent prescriptions sent in today, please check with the pharmacy within an hour of our visit to make sure the prescription was transmitted appropriately.   Please try these tips to maintain a healthy lifestyle:  Eat at least 3 REAL meals and 1-2 snacks per day.  Aim for no more than 5 hours between eating.  If you eat breakfast, please do so within one hour of getting up.   Each meal should contain half fruits/vegetables, one quarter protein, and one quarter carbs (no bigger than a computer mouse)  Cut down on sweet beverages. This includes juice, soda, and sweet tea.   Drink at least 1 glass of water with each meal and aim for at least 8 glasses per day  Exercise at least 150 minutes every week.    Preventive Care 70 Years and Older, Male Preventive care refers to lifestyle choices and visits with your health care provider that can promote health and wellness. Preventive care visits are also called wellness exams. What can I expect for my preventive care visit? Counseling During your preventive care visit, your health care provider may ask about your: Medical history, including: Past medical problems. Family medical history. History of falls. Current health, including: Emotional well-being. Home life and relationship well-being. Sexual activity. Memory and ability to understand (cognition). Lifestyle, including: Alcohol, nicotine or tobacco, and drug use. Access to firearms. Diet, exercise, and sleep habits. Work and work astronomer. Sunscreen use. Safety issues such as seatbelt and bike helmet use. Physical exam Your health care provider will check your: Height and weight. These may be used to calculate your BMI (body mass index). BMI is a measurement that tells if you are at a healthy weight. Waist circumference.  This measures the distance around your waistline. This measurement also tells if you are  at a healthy weight and may help predict your risk of certain diseases, such as type 2 diabetes and high blood pressure. Heart rate and blood pressure. Body temperature. Skin for abnormal spots. What immunizations do I need?  Vaccines are usually given at various ages, according to a schedule. Your health care provider will recommend vaccines for you based on your age, medical history, and lifestyle or other factors, such as travel or where you work. What tests do I need? Screening Your health care provider may recommend screening tests for certain conditions. This may include: Lipid and cholesterol levels. Diabetes screening. This is done by checking your blood sugar (glucose) after you have not eaten for a while (fasting). Hepatitis C test. Hepatitis B test. HIV (human immunodeficiency virus) test. STI (sexually transmitted infection) testing, if you are at risk. Lung cancer screening. Colorectal cancer screening. Prostate cancer screening. Abdominal aortic aneurysm (AAA) screening. You may need this if you are a current or former smoker. Talk with your health care provider about your test results, treatment options, and if necessary, the need for more tests. Follow these instructions at home: Eating and drinking  Eat a diet that includes fresh fruits and vegetables, whole grains, lean protein, and low-fat dairy products. Limit your intake of foods with high amounts of sugar, saturated fats, and salt. Take vitamin and mineral supplements as recommended by your health care provider. Do not drink alcohol if your health care provider tells you not to drink. If you drink alcohol: Limit how much you have to 0-2 drinks a day. Know how much alcohol is in your drink. In the [place] one drink equals one 12 oz bottle of beer (355 mL), one 5 oz glass of wine (148 mL), or one 1 oz glass of hard liquor (44 mL). Lifestyle Brush your teeth every morning and night with fluoride toothpaste. Floss one  time each day. Exercise for at least 30 minutes 5 or more days each week. Do not use any products that contain nicotine or tobacco. These products include cigarettes, chewing tobacco, and vaping devices, such as e-cigarettes. If you need help quitting, ask your health care provider. Do not use drugs. If you are sexually active, practice safe sex. Use a condom or other form of protection to prevent STIs. Take aspirin  only as told by your health care provider. Make sure that you understand how much to take and what form to take. Work with your health care provider to find out whether it is safe and beneficial for you to take aspirin  daily. Ask your health care provider if you need to take a cholesterol-lowering medicine (statin). Find healthy ways to manage stress, such as: Meditation, yoga, or listening to music. Journaling. Talking to a trusted person. Spending time with friends and family. Safety Always wear your seat belt while driving or riding in a vehicle. Do not drive: If you have been drinking alcohol. Do not ride with someone who has been drinking. When you are tired or distracted. While texting. If you have been using any mind-altering substances or drugs. Wear a helmet and other protective equipment during sports activities. If you have firearms in your house, make sure you follow all gun safety procedures. Minimize exposure to UV radiation to reduce your risk of skin cancer. What's next? Visit your health care provider once a year for an annual wellness visit. Ask your health care provider how often  you should have your eyes and teeth checked. Stay up to date on all vaccines. This information is not intended to replace advice given to you by your health care provider. Make sure you discuss any questions you have with your health care provider. Document Revised: 10/26/2020 Document Reviewed: 10/26/2020 Elsevier Patient Education  2024 [employer].

## 2024-06-04 NOTE — Assessment & Plan Note (Signed)
Follows with cardiology. Rate controlled today.  

## 2024-06-04 NOTE — Progress Notes (Signed)
 "  [name] is a 84 y.o. male who presents today for an office visit.  Assessment/Plan:  Chronic Problems Addressed Today: Gallstone Noted on CT scan in 2012. Asymptomatic. Discussed reasons to seek care but do not need to do any further work up for this at this point.   Hyperlipidemia Last lipid panel at goal on Lipitor 20 mg daily. Recheck 6-12 months.   Essential hypertension Blood pressure at goal today on coreg  3.125 twice daily and losartan  50 mg daily.   Chronic ITP (idiopathic thrombocytopenia) (HCC) Follows with hematology. Last CBC stable.   Depression, recurrent Stable without medications.   Benign prostatic hyperplasia Having mild lower urinary tract symptoms including nocturia but this is manageable. We did discuss trial of Flomax however he declined. He will let us  know if he changes mind.   Osteoarthritis Still having some back pain but this is manageable. We did discuss having him follow up with physical therapy but he declined. He will let us  know if he changes his mind.   Chronic insomnia Stable on alprazolam  as needed sparingly. Will refill today.   SVT (supraventricular tachycardia) Follows with cardiology. Rate controlled today.   Constipation No red flags. Improving with OTC miralax.  Also recommended increasing water intake and taking fiber as needed.  Preventative Healthcare Up-to-date on vaccines and screenings.    Subjective:  HPI:  See assessment / plan for status of chronic conditions.   Discussed the use of AI scribe software for clinical note transcription with the patient, who gave verbal consent to proceed.  History of Present Illness [name] is an 84 year old male who presents for a routine follow-up visit.  He has been experiencing gastrointestinal issues since last Monday, characterized by constipation followed by a single episode of diarrhea. There was no vomiting, and the symptoms resolved after a few days. He uses chewable  Miralax, taking two tablets occasionally, which he finds helpful. He does not use fiber supplements but tries to consume dietary fiber and drink water, though he acknowledges he may not drink enough.  He is concerned about low protein levels despite consuming chicken, fish, and using a protein powder that provides nine grams of protein per bottle of water. He avoids red meat due to a past heart condition but occasionally consumes filet mignon. He has started making smoothies with almond milk and various fruits. He does not count his daily protein intake.  He reports persistent sleepiness, especially around his eyes, despite sleeping well at night with the aid of 5.5 mg Xanax . He typically sleeps from 11 PM to 4 or 5 AM. He experiences slight bladder issues, including occasional dribbling at night, but manages it with saw palmetto, taking one tablet at night.  He has a history of arthritis, which affects his ability to stand for long periods, and experiences back pain that often results in a popping sensation during exercises. He performs exercises at home, including balance and stretching exercises, and uses 10-pound dumbbells. He is not currently interested in additional physical therapy.  He mentions a history of prostate issues, previously managed with ineffective medications. He experiences occasional dribbling but does not find it problematic enough to require further intervention.  He recalls a significant medical event in infancy, where he nearly died and required a blood transfusion from his father. He does not have details about the cause but speculates it might have been related to joint issues, as he was told his joints had dried up. He  has a history of childhood illnesses, including whooping cough, measles, mumps, and chickenpox.  PMH:  The following were reviewed and entered/updated in epic: Past Medical History:  Diagnosis Date   Anxiety    Arthritis of multiple joints    Basal cell  carcinoma of right scalp    Benign prostatic hypertrophy    CAD (coronary artery disease)    Chronic anticoagulation 02/20/2016   Chronic lower back pain, disk disease    Depression    Diverticulosis of colon    Dysrhythmia    Factor VII deficiency (HCC)    10/28/15: No history of factor VII deficiency per Dr. Freddie   History of blood transfusion 1941   History of colon polyps    Hyperlipidemia    ITP (idiopathic thrombocytopenic purpura)    Kidney stones x 2, both passed    Myocardial infarction (HCC) 2009   Seasonal allergies    Squamous carcinoma left scalp    T-cell lymphoma (HCC)    I'm in stage I; original site was on my left chest; had a place around my waist treated last summer; got a place on back of my left leg (10/03/2012)   Patient Active Problem List   Diagnosis Date Noted   Constipation 06/04/2024   Factor VII deficiency (HCC)    Caregiver burden, of wife 12/21/2017   Bilateral sensorineural hearing loss, followed by ENT and audiology 12/21/2017   History of cutaneous T-cell lymphoma 12/21/2017   History of adenomatous polyp of colon, removed by Dr. Teressa 4/13 12/21/2017   Presbycusis of both ears 02/07/2016   Primary osteoarthritis of right knee 10/20/2015   SVT (supraventricular tachycardia) 11/02/2012   Chronic insomnia 08/07/2012   History of MI (myocardial infarction) 07/24/2011   History of SCC (squamous cell carcinoma) of skin 07/24/2011   Depression, recurrent 07/24/2011   Chronic ITP (idiopathic thrombocytopenia) (HCC) 06/14/2011   Gallstone 06/14/2011   History of kidney stones 06/14/2011   CAD (coronary artery disease) 03/21/2011   Obstructive sleep apnea 09/14/2008   Cardiomyopathy, ischemic 05/16/2008   Essential hypertension 07/10/2007   Hyperlipidemia 11/18/2006   Allergic rhinitis, flonase  prn 11/18/2006   Diverticulosis of colon, last colonoscopy 4/18 11/18/2006   Benign prostatic hyperplasia 11/18/2006   Osteoarthritis 11/18/2006    Past Surgical History:  Procedure Laterality Date   CARDIAC CATHETERIZATION  10/03/2012   CATARACT EXTRACTION W/ INTRAOCULAR LENS  IMPLANT, BILATERAL Bilateral 2000's   COLONOSCOPY     CORONARY ANGIOPLASTY WITH STENT PLACEMENT  05/29/2007   1 (10/03/2012)   CYST EXCISION  1980's?   or fatty tumor; taken off my back (10/03/2012)   KNEE SURGERY     right   LEFT HEART CATHETERIZATION WITH CORONARY ANGIOGRAM N/A 10/03/2012   Procedure: LEFT HEART CATHETERIZATION WITH CORONARY ANGIOGRAM;  Surgeon: Lonni JONETTA Cash, MD;  Location: [facility] CATH LAB;  Service: Cardiovascular;  Laterality: N/A;   LUMBAR EPIDURAL INJECTION  ~ 2004   MOHS SURGERY Bilateral 2011-2012   top of my head (10/03/2012)   PARTIAL KNEE ARTHROPLASTY Right 11/08/2015   Procedure: RIGHT UNICOMPARTMENTAL KNEE;  Surgeon: Evalene JONETTA Chancy, MD;  Location: [facility] OR;  Service: Orthopedics;  Laterality: Right;   TONSILLECTOMY  ~ 1945   VASECTOMY      Family History  Problem Relation Age of Onset   Alzheimer's disease Mother    Lymphoma Father    Coronary artery disease Brother    COPD Brother    Diabetes Brother    Colon cancer Paternal Uncle  Diabetes Brother     Medications- reviewed and updated Current Outpatient Medications  Medication Sig Dispense Refill   acetaminophen  (TYLENOL ) 650 MG CR tablet Take 650 mg by mouth 2 (two) times daily.     aspirin  81 MG tablet Take 81 mg by mouth daily.     atorvastatin  (LIPITOR) 20 MG tablet TAKE 1 TABLET DAILY 90 tablet 3   b complex vitamins capsule Take 1 capsule by mouth daily.     carvedilol  (COREG ) 3.125 MG tablet TAKE 1 TABLET TWICE A DAY  WITH MEALS. 180 tablet 3   co-enzyme Q-10 30 MG capsule Take 20 mg by mouth daily.     fluticasone  (FLONASE ) 50 MCG/ACT nasal spray USE 1 SPRAY IN EACH NOSTRILTWICE DAILY 48 g 0   ketoconazole 2%-triamcinolone  0.1% 1:2 cream mixture Apply topically daily. 45 g 0   losartan  (COZAAR ) 50 MG tablet TAKE 1 TABLET DAILY 90 tablet 3    montelukast (SINGULAIR) 10 MG tablet Take 10 mg by mouth daily.     nitroGLYCERIN  (NITROSTAT ) 0.4 MG SL tablet Place 1 tablet (0.4 mg total) under the tongue every 5 (five) minutes as needed for chest pain (for chest pain). 25 tablet 3   Omega-3 Fatty Acids (FISH OIL) 1000 MG CAPS Take 1,000 mg by mouth 2 (two) times daily.      Saw Palmetto 450 MG CAPS Take 450 mg by mouth daily.     VITAMIN D PO Take 1,000 mg by mouth daily.     ALPRAZolam  (XANAX ) 0.5 MG tablet Take 1 tablet (0.5 mg total) by mouth 2 (two) times daily as needed. 180 tablet 1   No current facility-administered medications for this visit.    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   Tobacco Use   Smoking status: Never   Smokeless tobacco: Never  Vaping Use   Vaping status: Never Used  Substance and Sexual Activity   Alcohol use: No    Alcohol/week: 0.0 standard drinks of alcohol   Drug use: No   Sexual activity: Not Currently  Other Topics Concern   Not on file  Social History Narrative   Wife is in a skilled facility- dementia    2 children- 1 locally and 1 in [place]    Hobbies: golfing    Social Drivers of Health   Tobacco Use: Low Risk (06/04/2024)   Patient History    Smoking Tobacco Use: Never    Smokeless Tobacco Use: Never    Passive Exposure: Not on file  Financial Resource Strain: Low Risk (05/31/2024)   Overall Financial Resource Strain (CARDIA)    Difficulty of Paying Living Expenses: Not hard at all  Food Insecurity: No Food Insecurity (05/31/2024)   Epic    Worried About Programme Researcher, Broadcasting/film/video in the Last Year: Never true    Ran Out of Food in the Last Year: Never true  Transportation Needs: No Transportation  Needs (05/31/2024)   Epic    Lack of Transportation (Medical): No    Lack of Transportation (Non-Medical): No  Physical Activity: Insufficiently Active (05/31/2024)   Exercise Vital Sign    Days of Exercise per Week: 3 days  Minutes of Exercise per Session: 40 min  Stress: No Stress Concern Present (05/31/2024)   [employer] of Occupational Health - Occupational Stress Questionnaire    Feeling of Stress: Only a little  Social Connections: Unknown (05/31/2024)   Social Connection and Isolation Panel    Frequency of Communication with Friends and Family: More than three times a week    Frequency of Social Gatherings with Friends and Family: Not on file    Attends Religious Services: More than 4 times per year    Active Member of [employer] or Organizations: Not on file    Attends Banker Meetings: Not on file    Marital Status: Widowed  Depression (PHQ2-9): Low Risk (03/06/2024)   Depression (PHQ2-9)    PHQ-2 Score: 0  Alcohol Screen: Not on file  Housing: Low Risk (05/31/2024)   Epic    Unable to Pay for Housing in the Last Year: No    Number of Times Moved in the Last Year: 0    Homeless in the Last Year: No  Utilities: Not At Risk (05/06/2023)   [facility] Utilities    Threatened with loss of utilities: No  Health Literacy: Adequate Health Literacy (05/06/2023)   B1300 Health Literacy    Frequency of need for help with medical instructions: Never          Objective:  Physical Exam: BP 138/82   Pulse 62   Temp (!) 97.1 F (36.2 C) (Temporal)   Ht 5' 5 (1.651 m)   Wt 152 lb 9.6 oz (69.2 kg)   SpO2 98%   BMI 25.39 kg/m   Gen: No acute distress, resting comfortably CV: Regular rate and rhythm with no murmurs appreciated Pulm: Normal work of breathing, clear to auscultation bilaterally with no crackles, wheezes, or rhonchi Neuro: Grossly normal, moves all extremities Psych: Normal affect and thought content  Time Spent: 45 minutes of total time was spent on  the date of the encounter performing the following actions: chart review prior to seeing the patient, obtaining history, performing a medically necessary exam, counseling on the treatment plan, placing orders, and documenting in our EHR.        Worth HERO. Kennyth, MD 06/04/2024 11:45 AM     [1]  Allergies Allergen Reactions   Other Other (See Comments)    ANTI-DEPRESSANTS. ANTI-DEPRESSANTS Antidepressants...has a variety of reactions to different antidepressants   Prozac [Fluoxetine Hcl] Other (See Comments)    nightmares   Oxycodone  Other (See Comments)    unknown   "

## 2024-06-04 NOTE — Assessment & Plan Note (Signed)
 Follows with hematology. Last CBC stable.

## 2024-06-04 NOTE — Assessment & Plan Note (Addendum)
 No red flags. Improving with OTC miralax.  Also recommended increasing water intake and taking fiber as needed.

## 2024-06-04 NOTE — Assessment & Plan Note (Signed)
"   Stable without medications.  "

## 2025-03-08 ENCOUNTER — Inpatient Hospital Stay: Admitting: Oncology

## 2025-03-08 ENCOUNTER — Inpatient Hospital Stay

## 2025-06-08 ENCOUNTER — Ambulatory Visit: Admitting: Family Medicine
